# Patient Record
Sex: Female | Born: 1950 | Hispanic: No | Marital: Married | Smoking: Former smoker
Health system: Southern US, Community
[De-identification: ages and names within clinical notes are randomized; demographics above are authoritative.]

## PROBLEM LIST (undated history)

## (undated) DIAGNOSIS — I10 Essential (primary) hypertension: Secondary | ICD-10-CM

## (undated) DIAGNOSIS — J40 Bronchitis, not specified as acute or chronic: Secondary | ICD-10-CM

## (undated) DIAGNOSIS — J449 Chronic obstructive pulmonary disease, unspecified: Secondary | ICD-10-CM

## (undated) DIAGNOSIS — E119 Type 2 diabetes mellitus without complications: Secondary | ICD-10-CM

## (undated) HISTORY — PX: HERNIA REPAIR: SHX51

## (undated) HISTORY — PX: CHOLECYSTECTOMY: SHX55

## (undated) HISTORY — PX: FOOT SURGERY: SHX648

---

## 2005-05-11 ENCOUNTER — Emergency Department (HOSPITAL_COMMUNITY): Admission: EM | Admit: 2005-05-11 | Discharge: 2005-05-12 | Payer: Self-pay | Admitting: Emergency Medicine

## 2006-04-07 ENCOUNTER — Emergency Department (HOSPITAL_COMMUNITY): Admission: EM | Admit: 2006-04-07 | Discharge: 2006-04-07 | Payer: Self-pay | Admitting: Emergency Medicine

## 2006-05-19 ENCOUNTER — Inpatient Hospital Stay (HOSPITAL_COMMUNITY): Admission: EM | Admit: 2006-05-19 | Discharge: 2006-05-21 | Payer: Self-pay | Admitting: Emergency Medicine

## 2007-07-18 ENCOUNTER — Emergency Department (HOSPITAL_COMMUNITY): Admission: EM | Admit: 2007-07-18 | Discharge: 2007-07-18 | Payer: Self-pay | Admitting: Emergency Medicine

## 2008-02-21 ENCOUNTER — Ambulatory Visit: Payer: Self-pay | Admitting: Surgery

## 2008-02-21 ENCOUNTER — Other Ambulatory Visit: Payer: Self-pay

## 2008-02-28 ENCOUNTER — Ambulatory Visit: Payer: Self-pay | Admitting: Surgery

## 2010-11-04 NOTE — H&P (Signed)
Rhonda Blackwell, Rhonda Blackwell                 ACCOUNT NO.:  [id number]   MEDICAL RECORD NO.:  [id number]          PATIENT TYPE:  INP   LOCATION:  5711                         FACILITY:  [facility]   PHYSICIAN:  Kela Millin, M.D.DATE OF BIRTH:  11/16/1950   DATE OF ADMISSION:  05/19/2006  DATE OF DISCHARGE:                              HISTORY & PHYSICAL   PRIMARY CARE PHYSICIAN:  Unassigned.   CHIEF COMPLAINT:  Abdominal pain.   HISTORY OF PRESENT ILLNESS:  The patient is a pleasant 60 year old  female originally from [place], speaks limited [place], with past  medical history significant for GERD, diabetes mellitus, hypertension  and hypercholesterolemia who presents with complaints of abdominal pain  x9 days.  She also developed nausea and vomiting and was unable to keep  her food down so came to the ER.  She describes the abdominal pain as  diffuse cramping, severe in intensity, associated with nausea, vomiting  as already stated.  She denies diarrhea, dysuria, fevers, hematemesis,  hematochezia and/or melena.  She states that in September or so she had  a similar pain and her primary care physician at the time put her on  some medication and that resolved.  She reports that she is in the  process of finding a new primary care physician at this time because the  one she was seeing previously left the practice.   The patient was seen in the ER, and after she received Pepcid, Reglan  and Dilaudid, her symptoms improved.  Her chemistries revealed a sodium  of 120 and her lipase within normal limits at 27.  The patient was also  complaining of some back pain.  She had a CT scan of her abdomen and  pelvis which showed no acute abnormalities in the abdomen and pelvis-  status post cholecystectomy, multiple factorial spinal stenosis noted on  L3-4 and L4-5 levels.  She is admitted to the [facility] service  for further evaluation and management.   PAST MEDICAL HISTORY:  1. As stated  above.  2. History of arthritis.   MEDICATIONS:  1. Avandamet 2/500 mg daily.  2. Prilosec.  3. Zocor 40 mg q.h.s.  4. Avapro 850 mg daily.   ALLERGIES:  NKDA.   SOCIAL HISTORY:  Positive for tobacco a little over a pack per day.  Admits to alcohol use, [place] wine most days of the week.   FAMILY HISTORY:  Her sister has diabetes.  Her mother is deceased.  Had  diabetes and stroke.   REVIEW OF SYSTEMS:  As per HPI.  Other review of systems negative.   PHYSICAL EXAMINATION:  GENERAL:  The patient is a pleasant female.  She  is alert and oriented in no apparent distress.  VITAL SIGNS:  Her temperature is 97.7 with a blood pressure of 136/80,  pulse 59, respiratory rate of 16, O2 saturation of 97%.  HEENT:  PERRL.  EOMI.  Sclerae anicteric.  Dry mucus membranes.  No oral  exudates.  NECK:  Supple.  No adenopathy, no thyromegaly.  No JVD.  LUNGS:  Clear to auscultation bilaterally.  No crackles or wheezes.  CARDIOVASCULAR:  Regular rhythm, slightly bradycardic.  Normal S1-S2.  ABDOMEN:  Soft, mild diffuse tenderness.  No rebound tenderness.  Bowel  sounds present.  Nontender.  No organomegaly and no masses palpable.  EXTREMITIES:  No cyanosis and no edema.  NEUROLOGICAL:  She is alert and oriented x3.  Cranial nerves II through  XII grossly intact.  Nonfocal exam.   LABORATORY DATA:  CT of the abdomen and pelvis no acute abnormalities in  the abdomen.  Status post cholecystectomy.  No acute abnormalities in  the pelvis.  Small umbilical hernia containing fat, multifactorial  spinal stenosis at the L3-4 and L4-5 levels.  Her sodium is 120 with a  potassium of 4.6, chloride 89, CO2 of 24, glucose 107, BUN 8, creatinine  0.6, calcium is 8.8.  Her total protein is 7.2, AST is 27, ALT 23,  alkaline phosphate is 36, total bilirubin is 1.5 with a lipase of 27.  Her white cell count is 8.4, hemoglobin of 13.4, hematocrit of 40.4,  platelet count of 364.  Neutrophil count of 62%.    ASSESSMENT/PLAN:  1. Abdominal pain with nausea and vomiting:  As discussed above.  We      will obtain urinalysis with culture and sensitivity, questionable      gastroparesis.  CT scan of the abdomen as stated above.  Follow and      consider gastric emptying scan.  We will place the patient on      Reglan, proton pump inhibitor, pain management and follow.  2. Back pain:  CT scan as above with multifactorial spinal stenosis at      lumbar verterbrae-3/4 and lumbar verterbrae-4/5.  Pain management      following consult with orthopedics.  3. Hyponatremia:  Likely secondary to volume depletion.  Hydrate      following recheck.  Consider further evaluation as appropriate if      not improving on recheck.  4. Diabetes mellitus:  Accu-Chek, sliding scale insulin coverage.      Hold oral hypoglycemics until the patient is taking p.o. well.  5. Hypertension:  Continue Avapro.  6. Gastroesophageal reflux disease:  Proton pump inhibitor.  7. Hyperlipidemia:  Continue Zocor.  8. Alcohol use:  Ativan p.r.n. and follow.      Kela Millin, M.D.  Electronically Signed     ACV/MEDQ  D:  05/20/2006  T:  05/20/2006  Job:  [id number]

## 2011-03-09 LAB — DIFFERENTIAL
Basophils Relative: 1
Lymphocytes Relative: 30
Lymphs Abs: 3
Monocytes Absolute: 0.6
Monocytes Relative: 6
Neutro Abs: 6.3

## 2011-03-09 LAB — URINALYSIS, ROUTINE W REFLEX MICROSCOPIC
Bilirubin Urine: NEGATIVE
Ketones, ur: NEGATIVE
Protein, ur: NEGATIVE
Urobilinogen, UA: 0.2
pH: 7.5

## 2011-03-09 LAB — CBC
HCT: 37.5
Hemoglobin: 12.6
MCV: 76 — ABNORMAL LOW
RBC: 4.94
RDW: 14
WBC: 10.3

## 2011-03-09 LAB — COMPREHENSIVE METABOLIC PANEL
Alkaline Phosphatase: 49
Potassium: 3.8
Sodium: 130 — ABNORMAL LOW
Total Bilirubin: 0.7

## 2011-04-18 ENCOUNTER — Inpatient Hospital Stay (HOSPITAL_COMMUNITY)
Admission: EM | Admit: 2011-04-18 | Discharge: 2011-04-19 | DRG: 641 | Disposition: A | Discharge status: Home / Self Care | Attending: Internal Medicine | Admitting: Internal Medicine

## 2011-04-18 ENCOUNTER — Emergency Department (HOSPITAL_COMMUNITY)

## 2011-04-18 ENCOUNTER — Encounter: Payer: Self-pay | Admitting: Internal Medicine

## 2011-04-18 DIAGNOSIS — E871 Hypo-osmolality and hyponatremia: Secondary | ICD-10-CM

## 2011-04-18 DIAGNOSIS — E119 Type 2 diabetes mellitus without complications: Secondary | ICD-10-CM | POA: Diagnosis present

## 2011-04-18 DIAGNOSIS — T502X5A Adverse effect of carbonic-anhydrase inhibitors, benzothiadiazides and other diuretics, initial encounter: Secondary | ICD-10-CM | POA: Diagnosis present

## 2011-04-18 DIAGNOSIS — R109 Unspecified abdominal pain: Secondary | ICD-10-CM

## 2011-04-18 DIAGNOSIS — I959 Hypotension, unspecified: Secondary | ICD-10-CM | POA: Diagnosis present

## 2011-04-18 DIAGNOSIS — F17200 Nicotine dependence, unspecified, uncomplicated: Secondary | ICD-10-CM | POA: Diagnosis present

## 2011-04-18 LAB — COMPREHENSIVE METABOLIC PANEL
AST: 21 U/L (ref 0–37)
Albumin: 4.1 g/dL (ref 3.5–5.2)
CO2: 23 mEq/L (ref 19–32)
GFR calc Af Amer: >90 mL/min (ref >90)
Glucose, Bld: 133 mg/dL — ABNORMAL HIGH (ref 70–99)
Potassium: 4 mEq/L (ref 3.5–5.1)
Sodium: 119 mEq/L — CL (ref 135–145)

## 2011-04-18 LAB — OSMOLALITY: Osmolality: 263 mOsm/kg — ABNORMAL LOW (ref 275–300)

## 2011-04-18 LAB — SODIUM, URINE, RANDOM: Sodium, Ur: 39 mEq/L

## 2011-04-18 LAB — DIFFERENTIAL
Basophils Absolute: 0 10*3/uL (ref 0.0–0.1)
Basophils Relative: 0 % (ref 0–1)
Eosinophils Absolute: 0.1 10*3/uL (ref 0.0–0.7)
Eosinophils Relative: 1 % (ref 0–5)
Monocytes Relative: 6 % (ref 3–12)

## 2011-04-18 LAB — BASIC METABOLIC PANEL
BUN: 13 mg/dL (ref 6–23)
CO2: 26 mEq/L (ref 19–32)
CO2: 25 mEq/L (ref 19–32)
Calcium: 9.3 mg/dL (ref 8.4–10.5)
Calcium: 10.1 mg/dL (ref 8.4–10.5)
Chloride: 93 mEq/L — ABNORMAL LOW (ref 96–112)
Creatinine, Ser: 0.76 mg/dL (ref 0.50–1.10)
GFR calc Af Amer: >90 mL/min (ref >90)
GFR calc non Af Amer: >90 mL/min (ref >90)
GFR calc non Af Amer: >90 mL/min (ref >90)
Glucose, Bld: 117 mg/dL — ABNORMAL HIGH (ref 70–99)
Glucose, Bld: 145 mg/dL — ABNORMAL HIGH (ref 70–99)
Sodium: 126 mEq/L — ABNORMAL LOW (ref 135–145)

## 2011-04-18 LAB — CBC
Hemoglobin: 14.3 g/dL (ref 12.0–15.0)
MCH: 25.4 pg — ABNORMAL LOW (ref 26.0–34.0)
MCV: 73 fL — ABNORMAL LOW (ref 78.0–100.0)
RBC: 5.63 MIL/uL — ABNORMAL HIGH (ref 3.87–5.11)
RDW: 13.8 % (ref 11.5–15.5)
WBC: 11.9 10*3/uL — ABNORMAL HIGH (ref 4.0–10.5)

## 2011-04-18 LAB — GLUCOSE, CAPILLARY
Glucose-Capillary: 138 mg/dL — ABNORMAL HIGH (ref 70–99)
Glucose-Capillary: 122 mg/dL — ABNORMAL HIGH (ref 70–99)

## 2011-04-18 NOTE — H&P (Signed)
Hospital Admission Note Date: 04/18/2011  Patient name: Rhonda Blackwell Medical record number: [id number] Date of birth: May 12, 1951 Age: 60 y.o. Gender: female PCP: Dr. Loyal Jacobson   Medical Service: Internal Medicine Teaching Service  Attending physician:     1st Contact: Tawni Carnes, MS4  Pager: [phone] 2nd Contact: Dr. Johnette Abraham Pager: [phone] After 5 pm or weekends: 1st Contact:      Pager: [phone] 2nd Contact:      Pager: [phone]  Chief Complaint: Diffuse cramps  History of Present Illness:  Rhonda Blackwell is a pleasant 60 y/o with a PMH significant for HTN and type 2 DM who presents with diffuse, crampy pains most notably in her abdomen that started Sunday. She states that the pain comes and goes without any clear alleviating or aggravating factors, including OTC pain medications. The patient also notes some increased fatigue and lethargy over the weekend in addition to some nausea. Of note, the patient was newly started on chlorthalidone on Friday by her PCP. The patient was worried that this might be contributing to her symptoms and stopped the medication earlier this week. She denies any fever, headache, vomiting or new rashes. Upon admission, the patient had a sodium of 119 and was quickly given a 1 L NS bolus and started on NS at 200 mL/hr. She was also given zofran and valium in the ED. Several hours after admission, the patient stated that she had improvement of her abdominal pain.  Of note, the patient has been found to have a low sodium during a past doctor's appointment in February. She states that her hypertension medications were changed, although she is unsure what they were at that time.  Meds: Metformin 50-500 mg BID Irbesartan 150 mg BID Bystolic 20 mg BID Chlorthalidone 25 mg qday (d/ced) Pantoprazole 40 mg qday Lipitor 10 mg qday  Allergies: NKDA  Past Medical History: HTN Type 2 DM GERD Hernia Tobacco Abuse  Past Surgical  History: Cholecystectomy (2005) Hernia repair (2009)  Family History: Mother: DM, HTN, stroke Sister: DM, HTN, HLD  Social History: The patient lives in [place] with her husband. She is currently out of work due to debilitating knee pain that prevents her from standing for more than 2 hours. 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 She is unable to get around town because she does not have a [employer]. The patient has smoked 1-1.5 PPD for 41 years. She endorses past heavy alcohol use but has not had a drink since 2006. She denies illicit drug use.   Review of Systems: ROS negative except as noted in H&P above  Physical Exam: Vitals: T 98.7, HR 59, BP 132/74, RR 18 General: pt sitting in bed in ED in NAD HEENT: PERRL, EOMI, no scleral icterus, no conjunctival pallor Pulm: CTAB w/out w/r/c, good air mov't CV: RRR, nrml S1S2 w/out m/r/g Abd: NABS, slightly distended, NTTP, no HSM Ext: no c/c/e Neuro: A&Ox3, CNs II-XII grossly intact  Lab results: Basic Metabolic Panel:  Basename 04/18/11 0858  NA 119*  K 4.0  CL 82*  CO2 23  GLUCOSE 133*  BUN 10  CREATININE 0.57  CALCIUM 9.3  MG --  PHOS --   Urine Na: 39 Urine Osmolality: 236  Liver Function Tests:  Basename 04/18/11 0858  AST 21  ALT 18  ALKPHOS 63  BILITOT 1.1  PROT 7.2  ALBUMIN 4.1    Basename 04/18/11 0902  LIPASE 35  AMYLASE --   No results found for this basename: AMMONIA:2 in the last 72 hours CBC:  Basename 04/18/11 0912  WBC 11.9*  NEUTROABS 8.7*  HGB 14.3  HCT 41.1  MCV 73.0*  PLT 302     Imaging results:  Dg Abd Acute W/chest  04/18/2011  *RADIOLOGY REPORT*  Clinical Data: Abdominal pain, diarrhea  ACUTE ABDOMEN SERIES (ABDOMEN 2 VIEW & CHEST 1 VIEW)  Comparison: CT abdomen pelvis of 07/18/2007 and chest x-ray of the same date  Findings: The lungs are clear.  Mild cardiomegaly is stable. The right  hilum is somewhat more prominent most likely due to pulmonary artery segment, but follow-up chest x-ray with two views is recommended.  No bony abnormality is seen.  Supine and erect views of the abdomen show no bowel obstruction. There are a few scattered air-fluid levels throughout small bowel which may be due to gastroenteritis. Surgical clips are present in the right upper quadrant from prior cholecystectomy.  No free air is seen.  IMPRESSION:  1.  Slightly prominent right hilum probably due to pulmonary artery segment.  Consider follow-up two-view chest x-ray. 2.  No bowel obstruction or free air.  A few air-fluid levels may reflect gastroenteritis.  Original Report Authenticated By: Juline Patch, M.D.    Other results: EKG: normal EKG, normal sinus rhythm","unchanged from previous tracings"  Assessment & Plan by Problem: Rhonda Blackwell is a pleasant 60 y/o with a PMH significant for HTN and type 2 DM who presents with diffuse, crampy pains most notably in her abdomen that started Sunday. Given the patient's recent initiation of chlorthalidone, serum Na of 119 and resolving symptoms following IVF administration, these symptoms are most likely explained by acute hyponatremia secondary to antihypertensive medications.  1. Hyponatremia: The patient's symptoms can all be explained by her hyponatremia, which was likely secondary to her newly started chlorthalidone. Diuretic-induced hyponatremia is further supported by the patient's calculated serum osmolality of 249, urine osmolality of 236 and urine Na of 39 further support this. In addition, chlorthalidone is a common cause of medication-induced hyponatremia. However, if the patient does not have appropriate response in serum Na to IVF, we would consider the diagnosis of SIADH. While this patient is symptomatic, her lack of neurologic symptoms preclude the use of aggressive therapy. - Continue NS at 200 mL/hr with goal of < 10 meq/L per day - F/u 4 pm Na  and adjust NS infusion rate accordingly - F/u with patient's PCP regarding plan for BP meds upon discharge  2. Tobacco Abuse: The patient states a willingness to attempt tobacco cessation. The patient was counseled on tobacco cessation and told about potential options for tobacco cessation. - Start nicotine patches during hospitalization - Will provide smoking cessation materials prior to discharge  3. VTE prophylaxis: - Ambulation as tolerated  4. Dispo: Likely able to discharge in the next day or two following resolution of symptoms and correction of hyponatremia.   R2/3______________________________      R1________________________________  ATTENDING: I performed and/or observed a history and physical examination of the patient.  I discussed the case with the residents as noted and reviewed the residents' notes.  I agree with the findings and plan--please refer to the attending physician note for more details.  Signature________________________________  Printed Name_____________________________

## 2011-04-19 DIAGNOSIS — R109 Unspecified abdominal pain: Secondary | ICD-10-CM

## 2011-04-19 DIAGNOSIS — E871 Hypo-osmolality and hyponatremia: Secondary | ICD-10-CM

## 2011-04-19 LAB — BASIC METABOLIC PANEL
CO2: 27 mEq/L (ref 19–32)
Calcium: 9.9 mg/dL (ref 8.4–10.5)
Creatinine, Ser: 0.7 mg/dL (ref 0.50–1.10)
GFR calc Af Amer: >90 mL/min (ref >90)

## 2011-04-19 LAB — GLUCOSE, CAPILLARY

## 2012-02-22 ENCOUNTER — Emergency Department (HOSPITAL_COMMUNITY)

## 2012-02-22 ENCOUNTER — Observation Stay (HOSPITAL_COMMUNITY)
Admission: EM | Admit: 2012-02-22 | Discharge: 2012-02-23 | DRG: 392 | Disposition: A | Discharge status: Home / Self Care | Attending: Internal Medicine | Admitting: Internal Medicine

## 2012-02-22 ENCOUNTER — Encounter (HOSPITAL_COMMUNITY): Payer: Self-pay | Admitting: Emergency Medicine

## 2012-02-22 DIAGNOSIS — Y92009 Unspecified place in unspecified non-institutional (private) residence as the place of occurrence of the external cause: Secondary | ICD-10-CM

## 2012-02-22 DIAGNOSIS — E119 Type 2 diabetes mellitus without complications: Secondary | ICD-10-CM | POA: Diagnosis present

## 2012-02-22 DIAGNOSIS — E871 Hypo-osmolality and hyponatremia: Secondary | ICD-10-CM | POA: Diagnosis present

## 2012-02-22 DIAGNOSIS — F17200 Nicotine dependence, unspecified, uncomplicated: Secondary | ICD-10-CM | POA: Diagnosis present

## 2012-02-22 DIAGNOSIS — K589 Irritable bowel syndrome without diarrhea: Secondary | ICD-10-CM | POA: Diagnosis present

## 2012-02-22 DIAGNOSIS — T502X5A Adverse effect of carbonic-anhydrase inhibitors, benzothiadiazides and other diuretics, initial encounter: Secondary | ICD-10-CM | POA: Diagnosis present

## 2012-02-22 DIAGNOSIS — E785 Hyperlipidemia, unspecified: Secondary | ICD-10-CM | POA: Diagnosis present

## 2012-02-22 DIAGNOSIS — I10 Essential (primary) hypertension: Secondary | ICD-10-CM | POA: Diagnosis present

## 2012-02-22 DIAGNOSIS — A088 Other specified intestinal infections: Secondary | ICD-10-CM | POA: Diagnosis present

## 2012-02-22 DIAGNOSIS — R197 Diarrhea, unspecified: Principal | ICD-10-CM | POA: Diagnosis present

## 2012-02-22 HISTORY — DX: Essential (primary) hypertension: I10

## 2012-02-22 LAB — URINALYSIS, ROUTINE W REFLEX MICROSCOPIC
Ketones, ur: NEGATIVE mg/dL
Leukocytes, UA: NEGATIVE
Nitrite: NEGATIVE
Protein, ur: NEGATIVE mg/dL
Urobilinogen, UA: 0.2 mg/dL (ref 0.0–1.0)

## 2012-02-22 LAB — COMPREHENSIVE METABOLIC PANEL
Alkaline Phosphatase: 66 U/L (ref 39–117)
BUN: 9 mg/dL (ref 6–23)
CO2: 24 mEq/L (ref 19–32)
GFR calc Af Amer: >90 mL/min (ref >90)
GFR calc non Af Amer: >90 mL/min (ref >90)
Glucose, Bld: 103 mg/dL — ABNORMAL HIGH (ref 70–99)
Potassium: 4 mEq/L (ref 3.5–5.1)
Total Bilirubin: 0.9 mg/dL (ref 0.3–1.2)
Total Protein: 7.4 g/dL (ref 6.0–8.3)

## 2012-02-22 LAB — CBC WITH DIFFERENTIAL/PLATELET
Eosinophils Absolute: 0.1 10*3/uL (ref 0.0–0.7)
Hemoglobin: 14.3 g/dL (ref 12.0–15.0)
Lymphs Abs: 3.1 10*3/uL (ref 0.7–4.0)
MCH: 26.2 pg (ref 26.0–34.0)
MCV: 75.3 fL — ABNORMAL LOW (ref 78.0–100.0)
Monocytes Relative: 5 % (ref 3–12)
Neutrophils Relative %: 67 % (ref 43–77)
RBC: 5.46 MIL/uL — ABNORMAL HIGH (ref 3.87–5.11)

## 2012-02-22 MED ORDER — SITAGLIPTIN PHOS-METFORMIN HCL 50-500 MG PO TABS: 1.0000 | ORAL_TABLET | Freq: Two times a day (BID) | ORAL | Status: DC

## 2012-02-22 MED ORDER — FLUTICASONE PROPIONATE 50 MCG/ACT NA SUSP: 2.0000 | Freq: Every day | NASAL | Status: DC | PRN

## 2012-02-22 MED ORDER — HYDROCODONE-ACETAMINOPHEN 5-325 MG PO TABS
1.0000 | ORAL_TABLET | ORAL | Status: DC | PRN
  Administered 2012-02-23 (×2): 1 via ORAL
  Filled 2012-02-22 (×2): qty 1

## 2012-02-22 MED ORDER — SODIUM CHLORIDE 0.9 % IV SOLN
INTRAVENOUS | Status: AC
  Administered 2012-02-23: 01:00:00 via INTRAVENOUS

## 2012-02-22 MED ORDER — PROMETHAZINE HCL 12.5 MG PO TABS: 12.5000 mg | ORAL_TABLET | Freq: Four times a day (QID) | ORAL | Status: DC | PRN

## 2012-02-22 MED ORDER — IRBESARTAN 150 MG PO TABS
150.0000 mg | ORAL_TABLET | Freq: Two times a day (BID) | ORAL | Status: DC
  Administered 2012-02-23 (×2): 150 mg via ORAL
  Filled 2012-02-22 (×3): qty 1

## 2012-02-22 MED ORDER — HEPARIN SODIUM (PORCINE) 5000 UNIT/ML IJ SOLN
5000.0000 [IU] | Freq: Three times a day (TID) | INTRAMUSCULAR | Status: DC
  Filled 2012-02-22: qty 1

## 2012-02-22 MED ORDER — PANTOPRAZOLE SODIUM 40 MG PO TBEC: 40.0000 mg | DELAYED_RELEASE_TABLET | Freq: Every day | ORAL | Status: DC

## 2012-02-22 MED ORDER — NEBIVOLOL HCL 20 MG PO TABS: 20.0000 mg | ORAL_TABLET | Freq: Two times a day (BID) | ORAL | Status: DC

## 2012-02-22 MED ORDER — ALUM & MAG HYDROXIDE-SIMETH 200-200-20 MG/5ML PO SUSP: 10.0000 mL | Freq: Four times a day (QID) | ORAL | Status: DC | PRN

## 2012-02-22 MED ORDER — SODIUM CHLORIDE 0.9 % IV BOLUS (SEPSIS)
1000.0000 mL | Freq: Once | INTRAVENOUS | Status: AC
Start: 2012-02-22 — End: 2012-02-22
  Administered 2012-02-22: 1000 mL via INTRAVENOUS

## 2012-02-22 MED ORDER — NAPHAZOLINE HCL 0.1 % OP SOLN: 1.0000 [drp] | Freq: Four times a day (QID) | OPHTHALMIC | Status: DC | PRN

## 2012-02-22 MED ORDER — ATORVASTATIN CALCIUM 10 MG PO TABS
10.0000 mg | ORAL_TABLET | Freq: Every day | ORAL | Status: DC
  Filled 2012-02-22: qty 1

## 2012-02-22 NOTE — ED Notes (Signed)
Pt asked to get into gown.  Pt now on phone with family and will not end the call.  Will return shortly to attempted to refresh vitals.  RN aware

## 2012-02-22 NOTE — ED Notes (Signed)
Attempted to call report x1, asked to call back in 5-10 min.

## 2012-02-22 NOTE — ED Notes (Signed)
Correction going to br w/ diarrhea alot

## 2012-02-22 NOTE — ED Notes (Signed)
abd pain leg pain and cannot have bm today

## 2012-02-22 NOTE — H&P (Signed)
[facility]                                                                                    Patient Demographics  Rhonda Blackwell, is a 61 y.o. female  CSN: [id number]  MRN: [id number]  DOB - [birth year]  Admit Date - 02/22/2012  Outpatient Primary MD for the patient is Sid Falcon, MD   With History of -  Past Medical History  Diagnosis Date  . Hypertension   . Diabetes mellitus   . High cholesterol       No past surgical history on file.  in for   Chief Complaint  Patient presents with  . Abdominal Pain     HPI  Rhonda Blackwell  is a 61 y.o. female, with known past medical history of hypertension, diabetes, presents with complaints of feeling weak and secondary, as well complaining of nausea and diarrhea for the last 24 hours, and having lower extremity cramps, patient had similar complaints during his last admission in October of last year, patient was found to have hyponatremia with sodium level of 126, with no renal insufficiency, as well patient has been complaining of diarrhea she reports [place] episodes since last night, she denies any fever any vomiting but had no complaints of nausea, reports she's been having these episodes a chronically but she reports they've lasted longer this time, patient received 1 L of IV #bolus in ED, currently reports she feels much better. Denies any chest pain shortness of breath palpitation female in a bright red blood per rectum or coffee-ground emesis.    Review of Systems    In addition to the HPI above, No Fever-chills, No Headache, No changes with Vision or hearing, No problems swallowing food or Liquids, No Chest pain, Cough or Shortness of Breath, Mild Abdominal pain, mild Nausea but no Vommitting, has diarrhea No Blood in stool or Urine, No dysuria, No new skin rashes or bruises, No new joints pains-aches,  No new weakness, tingling, numbness in any extremity, complaints of lower extremity cramps No  recent weight gain or loss, No polyuria, polydypsia or polyphagia, No significant Mental Stressors.  A full 10 point Review of Systems was done, except as stated above, all other Review of Systems were negative.   Social History History  Substance Use Topics  . Smoking status: Current Everyday Smoker  . Smokeless tobacco: Not on file  . Alcohol Use: Yes    Family History No family history on file.   Prior to Admission medications   Medication Sig Start Date End Date Taking? Authorizing Provider  acetaminophen (TYLENOL) 500 MG tablet Take 1,000 mg by mouth every 6 (six) hours as needed. For pain.   Yes Historical Provider, MD  alum & mag hydroxide-simeth (MAALOX/MYLANTA) 200-200-20 MG/5ML suspension Take 10 mLs by mouth every 6 (six) hours as needed. For indigestion.   Yes Historical Provider, MD  atorvastatin (LIPITOR) 10 MG tablet Take 10 mg by mouth daily.   Yes Historical Provider, MD  fluticasone (FLONASE) 50 MCG/ACT nasal spray Place 2 sprays into the nose daily as needed. For congestion.   Yes Historical Provider, MD  hydrochlorothiazide (MICROZIDE) 12.5  MG capsule Take 12.5 mg by mouth daily.   Yes Historical Provider, MD  irbesartan (AVAPRO) 150 MG tablet Take 150 mg by mouth 2 (two) times daily.   Yes Historical Provider, MD  Nebivolol HCl (BYSTOLIC) 20 MG TABS Take 20 mg by mouth 2 (two) times daily.   Yes Historical Provider, MD  pantoprazole (PROTONIX) 40 MG tablet Take 40 mg by mouth daily.   Yes Historical Provider, MD  sitaGLIPtan-metformin (JANUMET) 50-500 MG per tablet Take 1 tablet by mouth 2 (two) times daily with a meal.   Yes Historical Provider, MD  tetrahydrozoline (VISINE EXTRA) 0.05 % ophthalmic solution Place 1 drop into both eyes 2 (two) times daily as needed. For dry eyes.   Yes Historical Provider, MD    No Known Allergies  Physical Exam  Vitals  Blood pressure 148/79, pulse 67, temperature 98.3 F (36.8 C), resp. rate 18, SpO2 97.00%.   1.  General well-nourished female lying in bed in NAD,   2. Normal affect and insight, Not Suicidal or Homicidal, Awake Alert, Oriented X 3.  3. No F.N deficits, ALL C.Nerves Intact, Strength 5/5 all 4 extremities, Sensation intact all 4 extremities, Plantars down going.  4. Ears and Eyes appear Normal, Conjunctivae clear, PERRLA. Moist Oral Mucosa.  5. Supple Neck, No JVD, No cervical lymphadenopathy appriciated, No Carotid Bruits.  6. Symmetrical Chest wall movement, Good air movement bilaterally, CTAB.  7. RRR, No Gallops, Rubs or Murmurs, No Parasternal Heave.  8. Positive Bowel Sounds, Abdomen Soft, Non tender, No organomegaly appriciated,No rebound -guarding or rigidity.  9.  No Cyanosis, Normal Skin Turgor, No Skin Rash or Bruise.  10. Good muscle tone,  joints appear normal , no effusions, Normal ROM.  11. No Palpable Lymph Nodes in Neck or Axillae    Data Review  CBC  Lab 02/22/12 1359  WBC 11.1*  HGB 14.3  HCT 41.1  PLT 321  MCV 75.3*  MCH 26.2  MCHC 34.8  RDW 13.3  LYMPHSABS 3.1  MONOABS 0.5  EOSABS 0.1  BASOSABS 0.0  BANDABS --   ------------------------------------------------------------------------------------------------------------------  Chemistries   Lab 02/22/12 1359  NA 126*  K 4.0  CL 89*  CO2 24  GLUCOSE 103*  BUN 9  CREATININE 0.65  CALCIUM 9.4  MG --  AST 23  ALT 19  ALKPHOS 66  BILITOT 0.9   ------------------------------------------------------------------------------------------------------------------ CrCl is unknown because there is no height on file for the current visit. ------------------------------------------------------------------------------------------------------------------ No results found for this basename: TSH,T4TOTAL,FREET3,T3FREE,THYROIDAB in the last 72 hours   Coagulation profile No results found for this basename: INR:5,PROTIME:5 in the last 168  hours ------------------------------------------------------------------------------------------------------------------- No results found for this basename: DDIMER:2 in the last 72 hours -------------------------------------------------------------------------------------------------------------------  Cardiac Enzymes No results found for this basename: CK:3,CKMB:3,TROPONINI:3,MYOGLOBIN:3 in the last 168 hours ------------------------------------------------------------------------------------------------------------------ No components found with this basename: POCBNP:3   ---------------------------------------------------------------------------------------------------------------  Urinalysis    Component Value Date/Time   COLORURINE YELLOW 02/22/2012 1352   APPEARANCEUR CLEAR 02/22/2012 1352   LABSPEC 1.008 02/22/2012 1352   PHURINE 7.0 02/22/2012 1352   GLUCOSEU NEGATIVE 02/22/2012 1352   HGBUR TRACE* 02/22/2012 1352   BILIRUBINUR NEGATIVE 02/22/2012 1352   KETONESUR NEGATIVE 02/22/2012 1352   PROTEINUR NEGATIVE 02/22/2012 1352   UROBILINOGEN 0.2 02/22/2012 1352   NITRITE NEGATIVE 02/22/2012 1352   LEUKOCYTESUR NEGATIVE 02/22/2012 1352    ----------------------------------------------------------------------------------------------------------------   Imaging results:   Dg Abd Acute W/chest  02/22/2012  *RADIOLOGY REPORT*  Clinical Data: Low abdominal pain for 1 year  with vomiting and cough.  History of hernia.  ACUTE ABDOMEN SERIES (ABDOMEN 2 VIEW & CHEST 1 VIEW)  Comparison: Acute abdominal series 04/18/2011.  Abdominal CT 07/18/2007.  Findings: The heart size and mediastinal contours are stable. Hilar prominence is unchanged from 2009 and likely vascular.  The lungs are clear.  There is no pleural effusion.  The bowel gas pattern is normal.  There is no free intraperitoneal air.  Cholecystectomy clips are noted.  There are scattered vascular calcifications.  There are calcifications adjacent to  the right femoral greater trochanter.  These appear chronic and therefore likely enthesophytes rather than hydroxyapatite deposition.  No acute osseous findings are seen.  IMPRESSION:  1.  No acute cardiopulmonary or abdominal process. 2.  Stable scattered vascular calcifications and calcifications adjacent to the right femoral greater trochanter.   Original Report Authenticated By: Gerrianne Scale, M.D.     Assessment & Plan  Active Problems:  Hyponatremia  Diarrhea  DM (diabetes mellitus)  HTN (hypertension)  Hyperlipemia    1. hyponatremia. This is most likely related to hydrochlorothiazide, will continue with IV normal saline already received 1 L in ED, we'll continue 100 cc per hour, will check serum osmolality urine sodium and urine osmolality, poor if the patient on an appropriate response to IV fluids will consider diagnosis of SIADH, but this appears to be recurrent problem , as she is on diuretics, and it is most likely the main cause.  2. Diarrhea: Most likely patient is having irritable bowel syndrome, but will check stool white blood cell and culture in case she is having gastroenteritis.  3. Diabetes mellitus, will continue patient on Janumet  4. Hypertension, continue her medication  5. Hyperlipidemia, continue statin  DVT Prophylaxis Heparin - AM Labs Ordered, also please review Full Orders  Family Communication: Admission, patients condition and plan of care including tests being ordered have been discussed with the patient  who indicate understanding and agree with the plan and Code Status.  Code Status focal  Disposition Plan: Home  Time spent in minutes : 40 min  Condition GUARDED  Mahika Vanvoorhis M.D on 02/22/2012 at 11:03 PM   [facility] Office  [phone]

## 2012-02-22 NOTE — ED Provider Notes (Signed)
History     CSN: [id number]  Arrival date & time 02/22/12  1333   First MD Initiated Contact with Patient 02/22/12 1836      Chief Complaint  Patient presents with  . Abdominal Pain   HPI  History provided by the patient. Patient is a 61 year old female with history of hypertension, hyperlipidemia, diabetes and prior history of hyponatremia who presents with complaints of general abdominal cramping, lower extremity cramps and back cramping. Patient states symptoms have been worsening over the past week. She also reports having nausea vomiting and diarrhea symptoms worsened with eating or drinking. Symptoms became worse last night with up to 10 soft "dirrhea like" BMs. Patient has been able to keep some fluids down and reports trying to drink as much water as possible. She also reports that she has not been taking her medications as normal his nausea vomiting. Patient denies any known sick contacts. She denies any recent fever, chills, sweats. Symptoms feel slightly similar to past hospitalization with low sodium although patient states pains in the abdomen and body are not as severe.     Past Medical History  Diagnosis Date  . Hypertension   . Diabetes mellitus   . High cholesterol     No past surgical history on file.  No family history on file.  History  Substance Use Topics  . Smoking status: Current Everyday Smoker  . Smokeless tobacco: Not on file  . Alcohol Use: Yes    OB History    Grav Para Term Preterm Abortions TAB SAB Ect Mult Living                  Review of Systems  Constitutional: Positive for appetite change. Negative for fever and chills.  Gastrointestinal: Positive for nausea, vomiting, abdominal pain and diarrhea.  Genitourinary: Negative for dysuria, frequency, hematuria and flank pain.  Musculoskeletal: Positive for myalgias and back pain.    Allergies  Review of patient's allergies indicates no known allergies.  Home Medications   Current  Outpatient Rx  Name Route Sig Dispense Refill  . ACETAMINOPHEN 500 MG PO TABS Oral Take 1,000 mg by mouth every 6 (six) hours as needed. For pain.    Marland Kitchen ALUM & MAG HYDROXIDE-SIMETH 200-200-20 MG/5ML PO SUSP Oral Take 10 mLs by mouth every 6 (six) hours as needed. For indigestion.    . ATORVASTATIN CALCIUM 10 MG PO TABS Oral Take 10 mg by mouth daily.    Marland Kitchen FLUTICASONE PROPIONATE 50 MCG/ACT NA SUSP Nasal Place 2 sprays into the nose daily as needed. For congestion.    Marland Kitchen HYDROCHLOROTHIAZIDE 12.5 MG PO CAPS Oral Take 12.5 mg by mouth daily.    . IRBESARTAN 150 MG PO TABS Oral Take 150 mg by mouth 2 (two) times daily.    . NEBIVOLOL HCL 20 MG PO TABS Oral Take 20 mg by mouth 2 (two) times daily.    Marland Kitchen PANTOPRAZOLE SODIUM 40 MG PO TBEC Oral Take 40 mg by mouth daily.    Marland Kitchen SITAGLIPTIN-METFORMIN HCL 50-500 MG PO TABS Oral Take 1 tablet by mouth 2 (two) times daily with a meal.    . TETRAHYDROZOLINE HCL 0.05 % OP SOLN Both Eyes Place 1 drop into both eyes 2 (two) times daily as needed. For dry eyes.      BP 159/74  Pulse 59  Temp 98.3 F (36.8 C)  Resp 16  SpO2 95%  Physical Exam  Nursing note and vitals reviewed. Constitutional: She is oriented to  person, place, and time. She appears well-developed and well-nourished. No distress.  HENT:  Head: Normocephalic.  Neck: Normal range of motion. Neck supple.  Cardiovascular: Normal rate and regular rhythm.   No murmur heard. Pulmonary/Chest: Effort normal and breath sounds normal. No respiratory distress. She has no wheezes. She has no rales.  Abdominal: Soft. Bowel sounds are normal. She exhibits no distension and no mass. There is no rebound and no guarding.       Very mild diffuse tenderness.  Neurological: She is alert and oriented to person, place, and time.  Skin: Skin is warm and dry.  Psychiatric: She has a normal mood and affect. Her behavior is normal.    ED Course  Procedures   Results for orders placed during the hospital  encounter of 02/22/12  CBC WITH DIFFERENTIAL      Component Value Range   WBC 11.1 (*) 4.0 - 10.5 K/uL   RBC 5.46 (*) 3.87 - 5.11 MIL/uL   Hemoglobin 14.3  12.0 - 15.0 g/dL   HCT 24.4  01.0 - 27.2 %   MCV 75.3 (*) 78.0 - 100.0 fL   MCH 26.2  26.0 - 34.0 pg   MCHC 34.8  30.0 - 36.0 g/dL   RDW 53.6  64.4 - 03.4 %   Platelets 321  150 - 400 K/uL   Neutrophils Relative 67  43 - 77 %   Neutro Abs 7.4  1.7 - 7.7 K/uL   Lymphocytes Relative 28  12 - 46 %   Lymphs Abs 3.1  0.7 - 4.0 K/uL   Monocytes Relative 5  3 - 12 %   Monocytes Absolute 0.5  0.1 - 1.0 K/uL   Eosinophils Relative 1  0 - 5 %   Eosinophils Absolute 0.1  0.0 - 0.7 K/uL   Basophils Relative 0  0 - 1 %   Basophils Absolute 0.0  0.0 - 0.1 K/uL  COMPREHENSIVE METABOLIC PANEL      Component Value Range   Sodium 126 (*) 135 - 145 mEq/L   Potassium 4.0  3.5 - 5.1 mEq/L   Chloride 89 (*) 96 - 112 mEq/L   CO2 24  19 - 32 mEq/L   Glucose, Bld 103 (*) 70 - 99 mg/dL   BUN 9  6 - 23 mg/dL   Creatinine, Ser 7.42  0.50 - 1.10 mg/dL   Calcium 9.4  8.4 - 59.5 mg/dL   Total Protein 7.4  6.0 - 8.3 g/dL   Albumin 4.1  3.5 - 5.2 g/dL   AST 23  0 - 37 U/L   ALT 19  0 - 35 U/L   Alkaline Phosphatase 66  39 - 117 U/L   Total Bilirubin 0.9  0.3 - 1.2 mg/dL   GFR calc non Af Amer >90  >90 mL/min   GFR calc Af Amer >90  >90 mL/min  URINALYSIS, ROUTINE W REFLEX MICROSCOPIC      Component Value Range   Color, Urine YELLOW  YELLOW   APPearance CLEAR  CLEAR   Specific Gravity, Urine 1.008  1.005 - 1.030   pH 7.0  5.0 - 8.0   Glucose, UA NEGATIVE  NEGATIVE mg/dL   Hgb urine dipstick TRACE (*) NEGATIVE   Bilirubin Urine NEGATIVE  NEGATIVE   Ketones, ur NEGATIVE  NEGATIVE mg/dL   Protein, ur NEGATIVE  NEGATIVE mg/dL   Urobilinogen, UA 0.2  0.0 - 1.0 mg/dL   Nitrite NEGATIVE  NEGATIVE   Leukocytes, UA NEGATIVE  NEGATIVE  URINE MICROSCOPIC-ADD ON      Component Value Range   Squamous Epithelial / LPF RARE  RARE   RBC / HPF 0-2  <3  RBC/hpf  LIPASE, BLOOD      Component Value Range   Lipase 51  11 - 59 U/L       Dg Abd Acute W/chest  02/22/2012  *RADIOLOGY REPORT*  Clinical Data: Low abdominal pain for 1 year with vomiting and cough.  History of hernia.  ACUTE ABDOMEN SERIES (ABDOMEN 2 VIEW & CHEST 1 VIEW)  Comparison: Acute abdominal series 04/18/2011.  Abdominal CT 07/18/2007.  Findings: The heart size and mediastinal contours are stable. Hilar prominence is unchanged from 2009 and likely vascular.  The lungs are clear.  There is no pleural effusion.  The bowel gas pattern is normal.  There is no free intraperitoneal air.  Cholecystectomy clips are noted.  There are scattered vascular calcifications.  There are calcifications adjacent to the right femoral greater trochanter.  These appear chronic and therefore likely enthesophytes rather than hydroxyapatite deposition.  No acute osseous findings are seen.  IMPRESSION:  1.  No acute cardiopulmonary or abdominal process. 2.  Stable scattered vascular calcifications and calcifications adjacent to the right femoral greater trochanter.   Original Report Authenticated By: Gerrianne Scale, M.D.      1. Hyponatremia       MDM  6:30PM patient seen and evaluated. Patient resting comfortably and appears in no acute distress. Patient's symptoms are similar to prior hospital admission. She states symptoms are less severe currently.   Spoke with [facility] hospitalist for unassigned.  They will see pt and evaluate.    [facility] will admit patient.  Angus Seller, [place] 02/22/12 2308

## 2012-02-23 LAB — COMPREHENSIVE METABOLIC PANEL
CO2: 25 mEq/L (ref 19–32)
Calcium: 9.2 mg/dL (ref 8.4–10.5)
Chloride: 101 mEq/L (ref 96–112)
Creatinine, Ser: 0.72 mg/dL (ref 0.50–1.10)
GFR calc Af Amer: >90 mL/min (ref >90)
GFR calc non Af Amer: >90 mL/min (ref >90)
Glucose, Bld: 103 mg/dL — ABNORMAL HIGH (ref 70–99)
Total Bilirubin: 0.5 mg/dL (ref 0.3–1.2)

## 2012-02-23 LAB — OSMOLALITY, URINE: Osmolality, Ur: 155 mOsm/kg — ABNORMAL LOW (ref 390–1090)

## 2012-02-23 LAB — SODIUM, URINE, RANDOM: Sodium, Ur: 28 mEq/L

## 2012-02-23 LAB — GLUCOSE, CAPILLARY: Glucose-Capillary: 90 mg/dL (ref 70–99)

## 2012-02-23 LAB — OSMOLALITY: Osmolality: 282 mOsm/kg (ref 275–300)

## 2012-02-23 MED ORDER — LINAGLIPTIN 5 MG PO TABS
5.0000 mg | ORAL_TABLET | Freq: Every day | ORAL | Status: DC
  Administered 2012-02-23: 5 mg via ORAL
  Filled 2012-02-23: qty 1

## 2012-02-23 MED ORDER — PANTOPRAZOLE SODIUM 40 MG PO TBEC
40.0000 mg | DELAYED_RELEASE_TABLET | Freq: Every day | ORAL | Status: DC
  Administered 2012-02-23: 40 mg via ORAL
  Filled 2012-02-23: qty 1

## 2012-02-23 MED ORDER — METFORMIN HCL 500 MG PO TABS
500.0000 mg | ORAL_TABLET | Freq: Two times a day (BID) | ORAL | Status: DC
  Administered 2012-02-23: 500 mg via ORAL
  Filled 2012-02-23 (×4): qty 1

## 2012-02-23 MED ORDER — NAPHAZOLINE-PHENIRAMINE 0.025-0.3 % OP SOLN: 1.0000 [drp] | Freq: Four times a day (QID) | OPHTHALMIC | Status: DC | PRN

## 2012-02-23 MED ORDER — HEPARIN SODIUM (PORCINE) 5000 UNIT/ML IJ SOLN
5000.0000 [IU] | Freq: Three times a day (TID) | INTRAMUSCULAR | Status: DC
  Administered 2012-02-23: 5000 [IU] via SUBCUTANEOUS
  Filled 2012-02-23 (×4): qty 1

## 2012-02-23 MED ORDER — NEBIVOLOL HCL 10 MG PO TABS
20.0000 mg | ORAL_TABLET | Freq: Two times a day (BID) | ORAL | Status: DC
  Administered 2012-02-23 (×2): 20 mg via ORAL
  Filled 2012-02-23 (×3): qty 2

## 2012-02-23 NOTE — ED Provider Notes (Signed)
Medical screening examination/treatment/procedure(s) were performed by non-physician practitioner and as supervising physician I was immediately available for consultation/collaboration.  Laporchia Nakajima T Victorious Kundinger, MD 02/23/12 0034 

## 2012-02-23 NOTE — Progress Notes (Signed)
Pt admitted to unit. Pt is A&O and VS are stable. She was oriented to the unit and is resting comfortably.

## 2012-02-23 NOTE — Discharge Summary (Signed)
Physician Discharge Summary  Rhonda Blackwell JXB:[id number] DOB: 28-Jun-1950 DOA: 02/22/2012  PCP: Sid Falcon, MD  Admit date: 02/22/2012 Discharge date: 02/23/2012  Discharge Diagnoses:  Active Problems:  Diarrhea  Hyponatremia  DM (diabetes mellitus)  HTN (hypertension)  Hyperlipemia   Discharge Condition: stable  Disposition:  Follow-up Information    Follow up with Sid Falcon, MD in 2 weeks. (hospital follow up)    Contact information:   [address] [place] [address] [phone]    Basic metabolic panel. Titrate Bp meds as tolerate it, avoids HCTZ causes hyponatremia.      Diet:ADA diet Wt Readings from Last 3 Encounters:  02/23/12 88.2 kg (194 lb 7.1 oz)    History of present illness:  61 y.o. female, with known past medical history of hypertension, diabetes, presents with complaints of feeling weak and secondary, as well complaining of nausea and diarrhea for the last 24 hours, and having lower extremity cramps, patient had similar complaints during his last admission in October of last year, patient was found to have hyponatremia with sodium level of 126, with no renal insufficiency, as well patient has been complaining of diarrhea she reports [place] episodes since last night, she denies any fever any vomiting but had no complaints of nausea, reports she's been having these episodes a chronically but she reports they've lasted longer this time, patient received 1 L of IV #bolus in ED, currently reports she feels much better. Denies any chest pain shortness of breath palpitation female in a bright red blood per rectum or coffee-ground emesis.   Hospital Course:  Diarrhea (02/22/2012) -resolved, ? Viral gastroenteritis vs IBS. -cultures negative, resolved in 24 hours.  Hyponatremia (02/22/2012) -resolved with IV fluids. HCTZ held. . Multifactorial Decrease intravascular volume and HCTZ.  DM (diabetes mellitus) (02/22/2012) -no change continue  current therapy.   HTN (hypertension) (02/22/2012) -stable   Discharge Exam: Filed Vitals:   02/23/12 1029  BP: 135/71  Pulse: 56  Temp:   Resp:    Filed Vitals:   02/23/12 0013 02/23/12 0039 02/23/12 0509 02/23/12 1029  BP: 137/67 155/80 157/88 135/71  Pulse: 74 56 59 56  Temp:  98.2 F (36.8 C) 98.7 F (37.1 C)   TempSrc:  Oral Oral   Resp: 16 18 16    Height:  5\' 2"  (1.575 m)    Weight:  88.2 kg (194 lb 7.1 oz)    SpO2: 99% 92% 91%      Discharge Instructions  Discharge Orders    Future Orders Please Complete By Expires   Diet - low sodium heart healthy      Increase activity slowly        Medication List  As of 02/23/2012  1:28 PM   STOP taking these medications         hydrochlorothiazide 12.5 MG capsule         TAKE these medications         acetaminophen 500 MG tablet   Commonly known as: TYLENOL   Take 1,000 mg by mouth every 6 (six) hours as needed. For pain.      alum & mag hydroxide-simeth 200-200-20 MG/5ML suspension   Commonly known as: MAALOX/MYLANTA   Take 10 mLs by mouth every 6 (six) hours as needed. For indigestion.      atorvastatin 10 MG tablet   Commonly known as: LIPITOR   Take 10 mg by mouth daily.      BYSTOLIC 20 MG Tabs  Generic drug: Nebivolol HCl   Take 20 mg by mouth 2 (two) times daily.      fluticasone 50 MCG/ACT nasal spray   Commonly known as: FLONASE   Place 2 sprays into the nose daily as needed. For congestion.      irbesartan 150 MG tablet   Commonly known as: AVAPRO   Take 150 mg by mouth 2 (two) times daily.      pantoprazole 40 MG tablet   Commonly known as: PROTONIX   Take 40 mg by mouth daily.      sitaGLIPtan-metformin 50-500 MG per tablet   Commonly known as: JANUMET   Take 1 tablet by mouth 2 (two) times daily with a meal.      VISINE EXTRA 0.05 % ophthalmic solution   Generic drug: tetrahydrozoline   Place 1 drop into both eyes 2 (two) times daily as needed. For dry eyes.               The results of significant diagnostics from this hospitalization (including imaging, microbiology, ancillary and laboratory) are listed below for reference.    Significant Diagnostic Studies: Dg Abd Acute W/chest  02/22/2012  *RADIOLOGY REPORT*  Clinical Data: Low abdominal pain for 1 year with vomiting and cough.  History of hernia.  ACUTE ABDOMEN SERIES (ABDOMEN 2 VIEW & CHEST 1 VIEW)  Comparison: Acute abdominal series 04/18/2011.  Abdominal CT 07/18/2007.  Findings: The heart size and mediastinal contours are stable. Hilar prominence is unchanged from 2009 and likely vascular.  The lungs are clear.  There is no pleural effusion.  The bowel gas pattern is normal.  There is no free intraperitoneal air.  Cholecystectomy clips are noted.  There are scattered vascular calcifications.  There are calcifications adjacent to the right femoral greater trochanter.  These appear chronic and therefore likely enthesophytes rather than hydroxyapatite deposition.  No acute osseous findings are seen.  IMPRESSION:  1.  No acute cardiopulmonary or abdominal process. 2.  Stable scattered vascular calcifications and calcifications adjacent to the right femoral greater trochanter.   Original Report Authenticated By: Gerrianne Scale, M.D.     Microbiology: No results found for this or any previous visit (from the past 240 hour(s)).   Labs: Basic Metabolic Panel:  Lab 02/23/12 1610 02/22/12 1359  NA 136 126*  K 4.0 4.0  CL 101 89*  CO2 25 24  GLUCOSE 103* 103*  BUN 9 9  CREATININE 0.72 0.65  CALCIUM 9.2 9.4  MG -- --  PHOS -- --   Liver Function Tests:  Lab 02/23/12 0540 02/22/12 1359  AST 21 23  ALT 17 19  ALKPHOS 56 66  BILITOT 0.5 0.9  PROT 6.6 7.4  ALBUMIN 3.6 4.1    Lab 02/22/12 2143  LIPASE 51  AMYLASE --   No results found for this basename: AMMONIA:5 in the last 168 hours CBC:  Lab 02/22/12 1359  WBC 11.1*  NEUTROABS 7.4  HGB 14.3  HCT 41.1  MCV 75.3*  PLT 321    Cardiac Enzymes: No results found for this basename: CKTOTAL:5,CKMB:5,CKMBINDEX:5,TROPONINI:5 in the last 168 hours BNP: No components found with this basename: POCBNP:5 CBG:  Lab 02/23/12 1228 02/23/12 0802  GLUCAP 90 115*    Time coordinating discharge: 30 minutes  Signed:  Marinda Elk  [facility] 02/23/2012, 1:28 PM

## 2012-02-23 NOTE — Progress Notes (Signed)
02/23/12 1350  D/C instructions explained and given to pt. and daughter; no problems noted;pt. states she's ready to go home. No change in skin.  Accompanied downstairs via w/c by volunteer and daughter.  Leandrew Koyanagi Melody Savidge,RN

## 2012-02-23 NOTE — Care Management Note (Signed)
    Page 1 of 1   02/23/2012     3:36:04 PM   CARE MANAGEMENT NOTE 02/23/2012  Patient:  Rhonda Blackwell, Rhonda Blackwell   Account Number:  [id number]  Date Initiated:  02/23/2012  Documentation initiated by:  Letha Cape  Subjective/Objective Assessment:   dx abd pain, n/diarrhea, hyponatemia  admit- lives with spouse. pta independent.     Action/Plan:   Anticipated DC Date:  02/23/2012   Anticipated DC Plan:  HOME/SELF CARE      DC Planning Services  CM consult      Choice offered to / List presented to:             Status of service:  Completed, signed off Medicare Important Message given?   (If response is "NO", the following Medicare IM given date fields will be blank) Date Medicare IM given:   Date Additional Medicare IM given:    Discharge Disposition:  HOME/SELF CARE  Per UR Regulation:  Reviewed for med. necessity/level of care/duration of stay  If discussed at Long Length of Stay Meetings, dates discussed:    Comments:  02/23/12 15:35 [place] Ladona Ridgel RN, BSN [phone] patient lives with spouse, pta independent.  No needs anticipated.

## 2012-02-23 NOTE — Progress Notes (Signed)
[facility] PROGRESS NOTE  Rhonda Blackwell ZOX:[id number] DOB: March 06, 1951 DOA: 02/22/2012   Assessment/Plan: Patient Active Hospital Problem List: Diarrhea (02/22/2012) -resolved, ? Viral gastroenteritis vs IBS  Hyponatremia (02/22/2012) -resolved. Multifactorial Decrease intravascular volume and HCTZ  DM (diabetes mellitus) (02/22/2012) -no change continue current therapy.  HTN (hypertension) (02/22/2012) -stable     LOS: 1 day   Procedures:  none  Antibiotics:  none   Subjective: No complains  Objective: Filed Vitals:   02/23/12 0013 02/23/12 0039 02/23/12 0509 02/23/12 1029  BP: 137/67 155/80 157/88 135/71  Pulse: 74 56 59 56  Temp:  98.2 F (36.8 C) 98.7 F (37.1 C)   TempSrc:  Oral Oral   Resp: 16 18 16    Height:  5\' 2"  (1.575 m)    Weight:  88.2 kg (194 lb 7.1 oz)    SpO2: 99% 92% 91%     Intake/Output Summary (Last 24 hours) at 02/23/12 1311 Last data filed at 02/23/12 1043  Gross per 24 hour  Intake 656.67 ml  Output    925 ml  Net -268.33 ml   Weight change:   Exam:  General: Alert, awake, oriented x3, in no acute distress.  HEENT: No bruits, no goiter.  Heart: Regular rate and rhythm, without murmurs, rubs, gallops.  Lungs: Good air movement, bilateral air movement.  Abdomen: Soft, nontender, nondistended, positive bowel sounds.  Neuro: Grossly intact, nonfocal.   Data Reviewed: Basic Metabolic Panel:  Lab 02/23/12 8119 02/22/12 1359  NA 136 126*  K 4.0 4.0  CL 101 89*  CO2 25 24  GLUCOSE 103* 103*  BUN 9 9  CREATININE 0.72 0.65  CALCIUM 9.2 9.4  MG -- --  PHOS -- --   Liver Function Tests:  Lab 02/23/12 0540 02/22/12 1359  AST 21 23  ALT 17 19  ALKPHOS 56 66  BILITOT 0.5 0.9  PROT 6.6 7.4  ALBUMIN 3.6 4.1    Lab 02/22/12 2143  LIPASE 51  AMYLASE --   No results found for this basename: AMMONIA:5 in the last 168 hours CBC:  Lab 02/22/12 1359  WBC 11.1*  NEUTROABS 7.4  HGB 14.3  HCT 41.1  MCV 75.3*  PLT 321    Cardiac Enzymes: No results found for this basename: CKTOTAL:5,CKMB:5,CKMBINDEX:5,TROPONINI:5 in the last 168 hours BNP: No components found with this basename: POCBNP:5 CBG:  Lab 02/23/12 1228 02/23/12 0802  GLUCAP 90 115*    No results found for this or any previous visit (from the past 240 hour(s)).   Studies: Dg Abd Acute W/chest  02/22/2012  *RADIOLOGY REPORT*  Clinical Data: Low abdominal pain for 1 year with vomiting and cough.  History of hernia.  ACUTE ABDOMEN SERIES (ABDOMEN 2 VIEW & CHEST 1 VIEW)  Comparison: Acute abdominal series 04/18/2011.  Abdominal CT 07/18/2007.  Findings: The heart size and mediastinal contours are stable. Hilar prominence is unchanged from 2009 and likely vascular.  The lungs are clear.  There is no pleural effusion.  The bowel gas pattern is normal.  There is no free intraperitoneal air.  Cholecystectomy clips are noted.  There are scattered vascular calcifications.  There are calcifications adjacent to the right femoral greater trochanter.  These appear chronic and therefore likely enthesophytes rather than hydroxyapatite deposition.  No acute osseous findings are seen.  IMPRESSION:  1.  No acute cardiopulmonary or abdominal process. 2.  Stable scattered vascular calcifications and calcifications adjacent to the right femoral greater trochanter.   Original Report Authenticated By: Kimberlee Nearing.  Purcell Mouton, M.D.     Scheduled Meds:   . atorvastatin  10 mg Oral q1800  . heparin  5,000 Units Subcutaneous Q8H  . irbesartan  150 mg Oral BID  . linagliptin  5 mg Oral Daily  . metFORMIN  500 mg Oral BID WC  . nebivolol  20 mg Oral BID  . pantoprazole  40 mg Oral Q1200  . sodium chloride  1,000 mL Intravenous Once  . DISCONTD: heparin  5,000 Units Subcutaneous Q8H  . DISCONTD: Nebivolol HCl  20 mg Oral BID  . DISCONTD: pantoprazole  40 mg Oral Daily  . DISCONTD: sitaGLIPtan-metformin  1 tablet Oral BID WC   Continuous Infusions:   . sodium chloride 100  mL/hr at 02/23/12 0055    Lambert Keto, MD  [facility] Pager [phone]  If 7PM-7AM, please contact night-coverage [email or web address] Password [email or web address] 02/23/2012, 1:11 PM

## 2012-12-24 IMAGING — CR DG ABDOMEN ACUTE W/ 1V CHEST
3 series · 3 of 3 positions shown · non-contrast
Comparison: Acute abdominal series 04/18/2011.  Abdominal CT
07/18/2007.

CLINICAL DATA: Low abdominal pain for 1 year with vomiting and
cough.  History of hernia.

ACUTE ABDOMEN SERIES (ABDOMEN 2 VIEW & CHEST 1 VIEW)

[w chest pa]
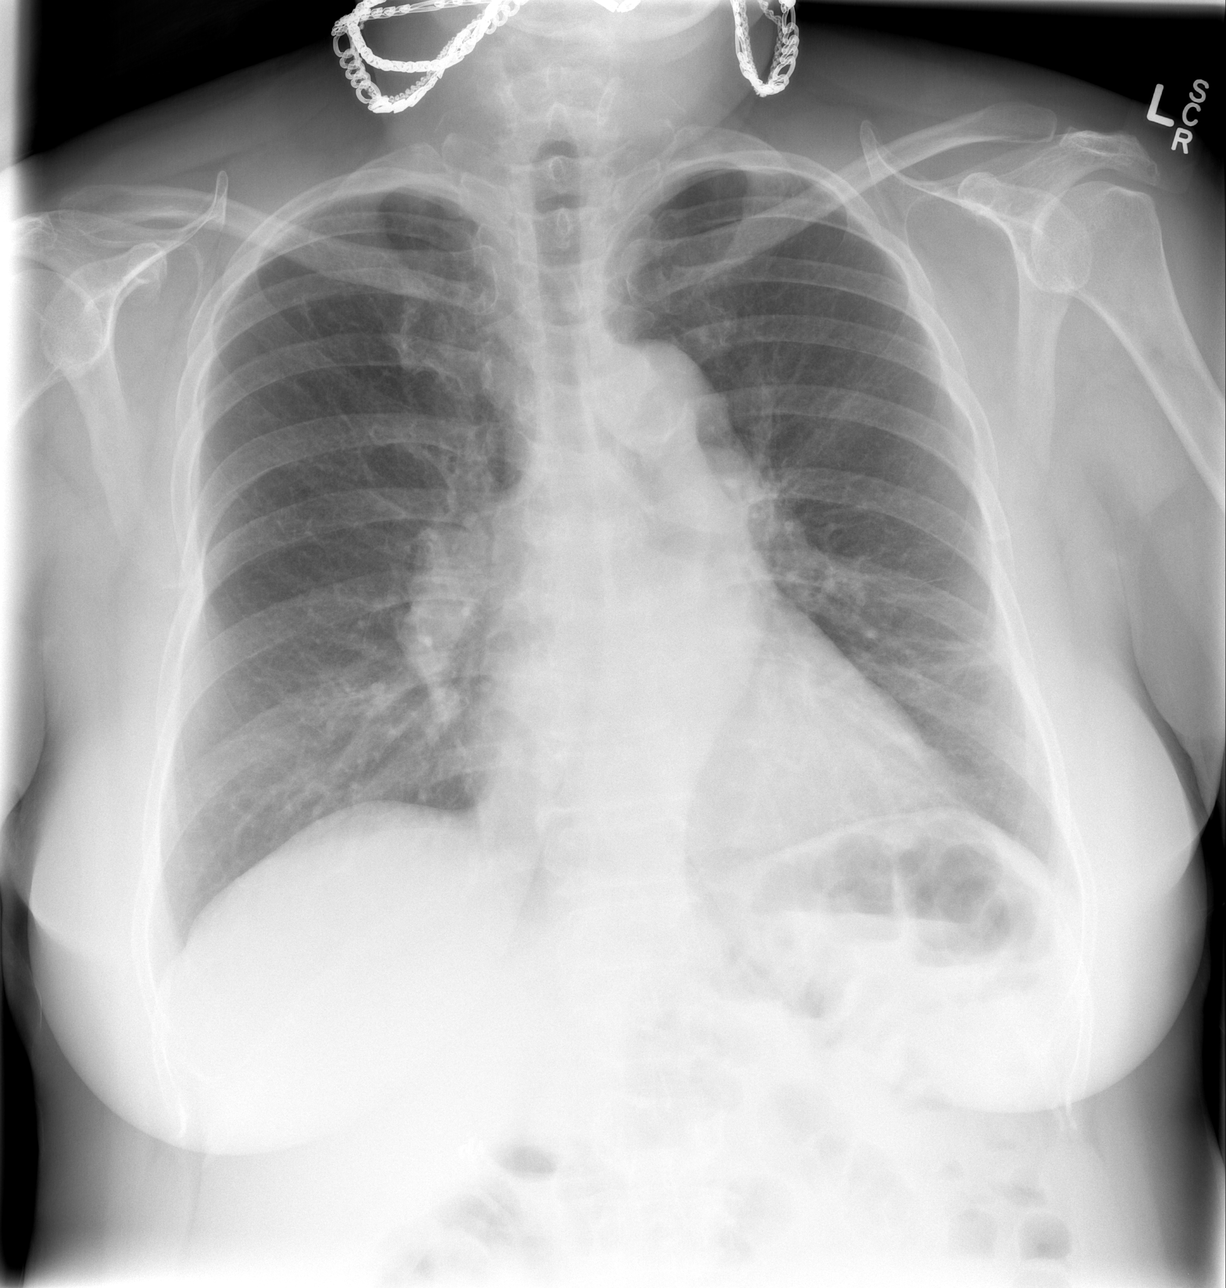

[w abdomen upright]
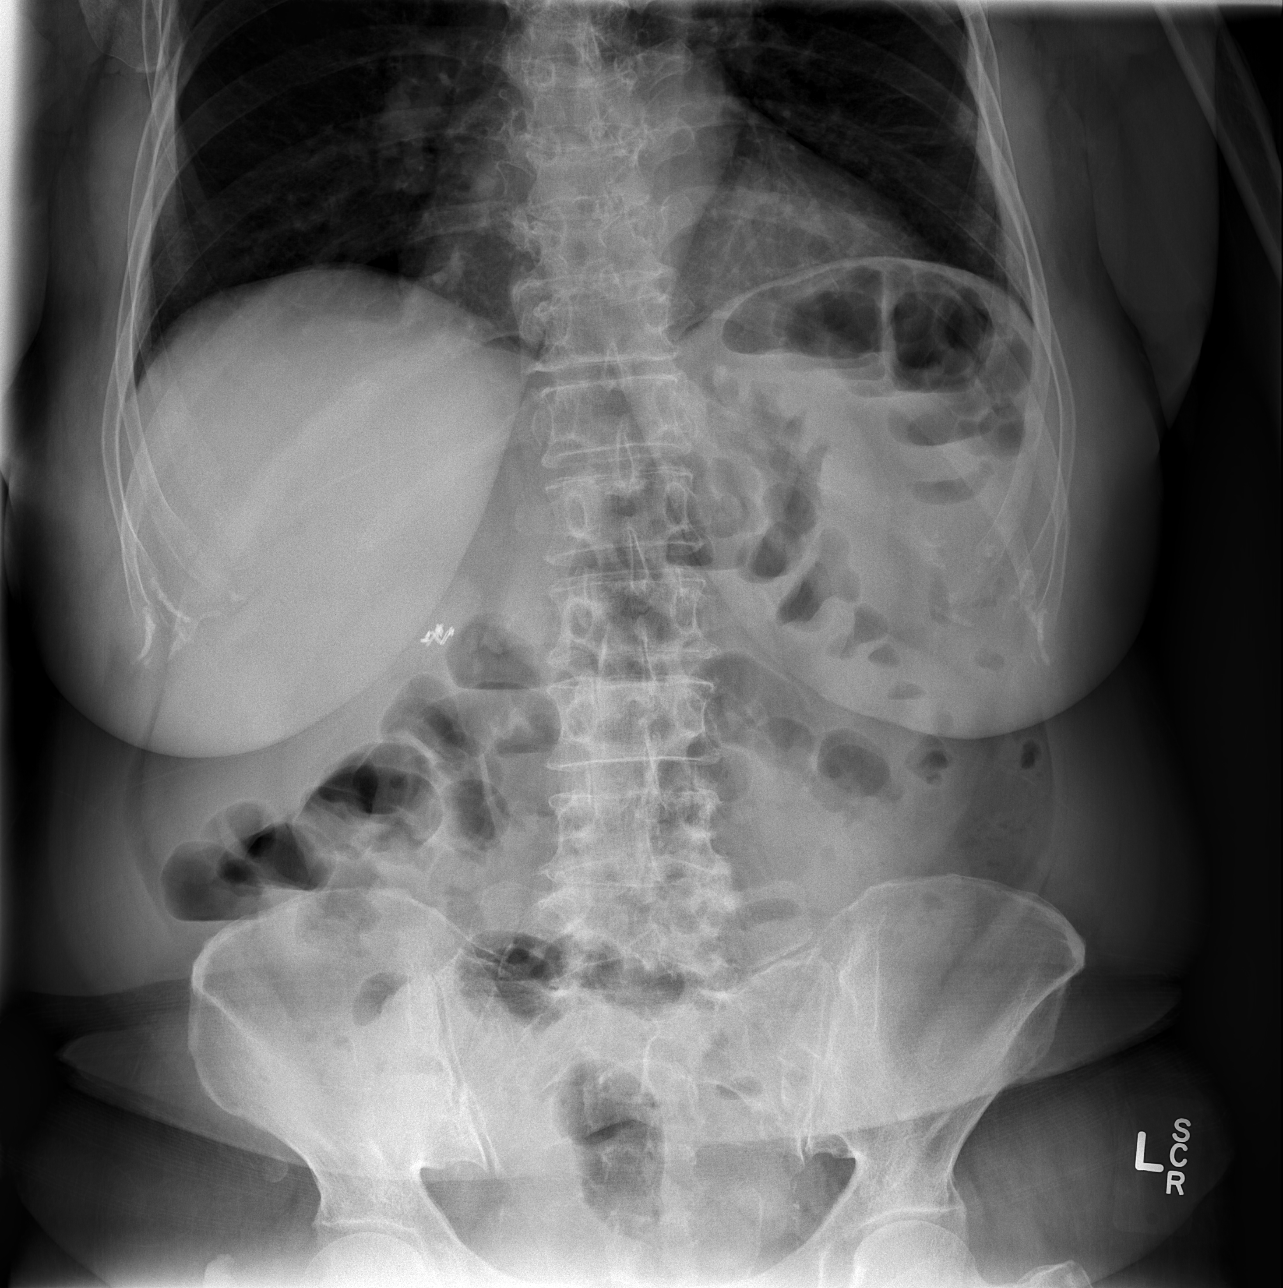

[t abdomen supine]
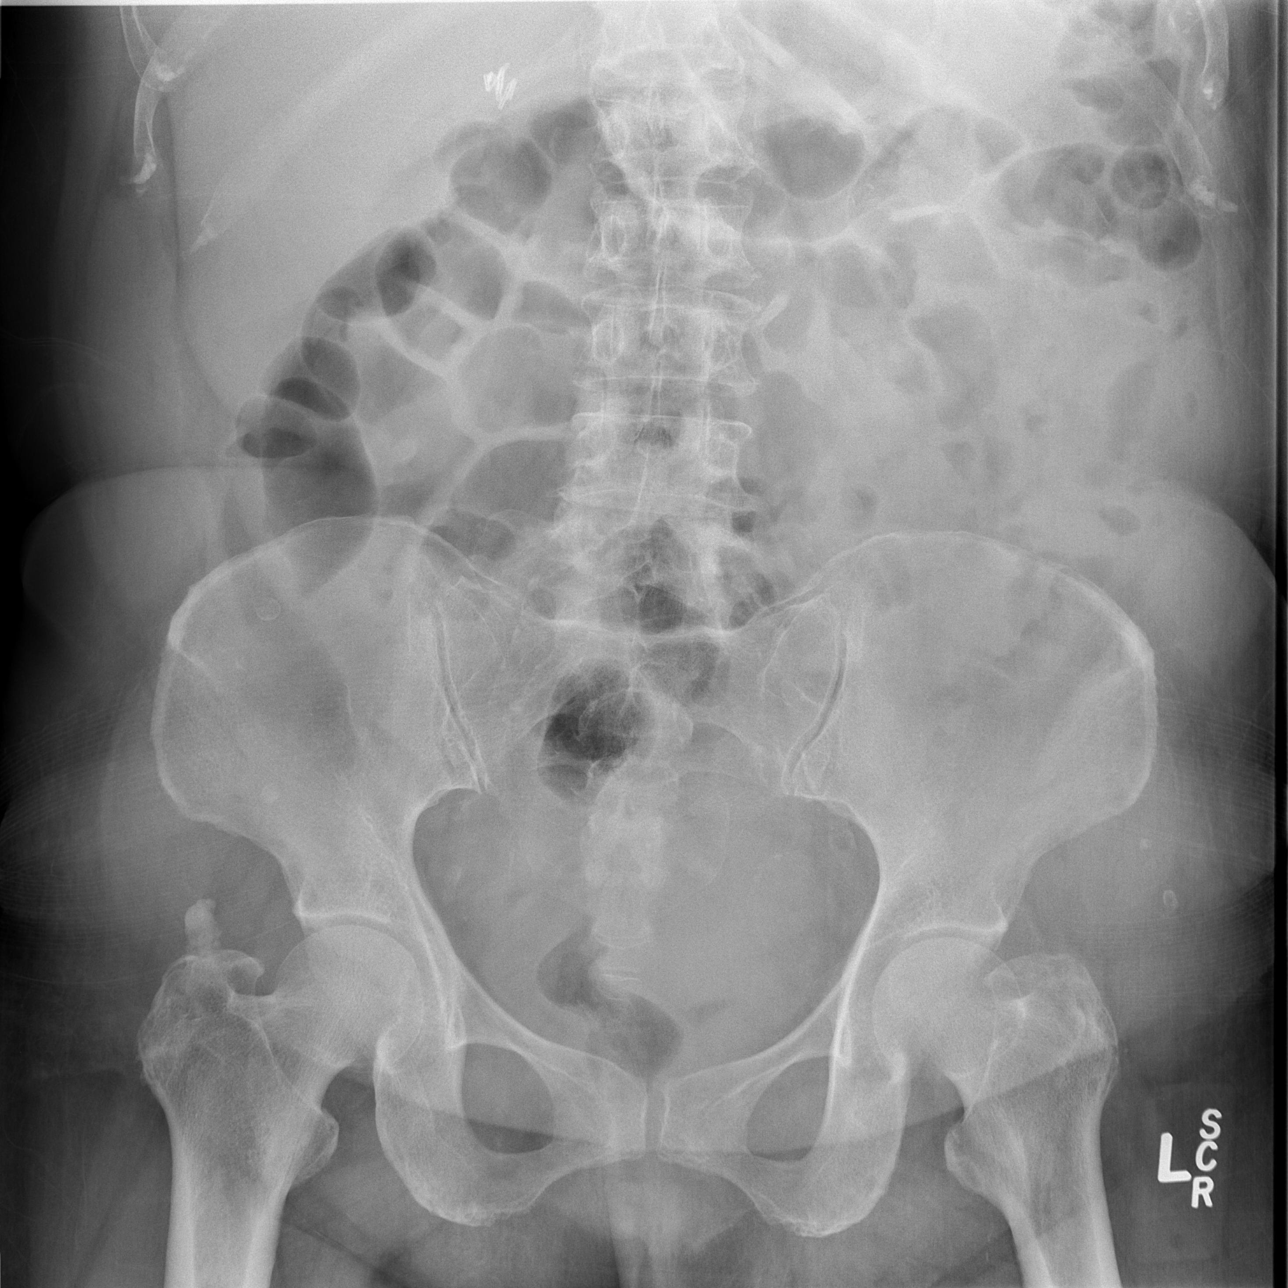

[3 of 3 positions shown; findings below may reference images not displayed]

FINDINGS: The heart size and mediastinal contours are stable.
Hilar prominence is unchanged from 6881 and likely vascular.  The
lungs are clear.  There is no pleural effusion.

The bowel gas pattern is normal.  There is no free intraperitoneal
air.  Cholecystectomy clips are noted.  There are scattered
vascular calcifications.  There are calcifications adjacent to the
right femoral greater trochanter.  These appear chronic and
therefore likely enthesophytes rather than hydroxyapatite
deposition.  No acute osseous findings are seen.
IMPRESSION: 1.  No acute cardiopulmonary or abdominal process.
2.  Stable scattered vascular calcifications and calcifications
adjacent to the right femoral greater trochanter.

## 2012-12-26 ENCOUNTER — Ambulatory Visit: Attending: Neurology | Admitting: Rehabilitation

## 2012-12-26 DIAGNOSIS — IMO0001 Reserved for inherently not codable concepts without codable children: Secondary | ICD-10-CM | POA: Insufficient documentation

## 2012-12-26 DIAGNOSIS — IMO0002 Reserved for concepts with insufficient information to code with codable children: Secondary | ICD-10-CM | POA: Insufficient documentation

## 2012-12-26 DIAGNOSIS — M545 Low back pain, unspecified: Secondary | ICD-10-CM | POA: Insufficient documentation

## 2012-12-26 DIAGNOSIS — M25559 Pain in unspecified hip: Secondary | ICD-10-CM | POA: Insufficient documentation

## 2012-12-31 ENCOUNTER — Ambulatory Visit: Admitting: Physical Therapy

## 2013-01-02 ENCOUNTER — Ambulatory Visit: Admitting: Physical Therapy

## 2013-01-07 ENCOUNTER — Ambulatory Visit

## 2013-01-09 ENCOUNTER — Ambulatory Visit: Admitting: Physical Therapy

## 2013-01-14 ENCOUNTER — Ambulatory Visit: Admitting: Rehabilitation

## 2013-01-16 ENCOUNTER — Ambulatory Visit: Admitting: Rehabilitation

## 2013-01-21 ENCOUNTER — Ambulatory Visit: Attending: Rehabilitation | Admitting: Rehabilitation

## 2013-01-21 DIAGNOSIS — M545 Low back pain, unspecified: Secondary | ICD-10-CM | POA: Insufficient documentation

## 2013-01-21 DIAGNOSIS — M25559 Pain in unspecified hip: Secondary | ICD-10-CM | POA: Insufficient documentation

## 2013-01-21 DIAGNOSIS — IMO0001 Reserved for inherently not codable concepts without codable children: Secondary | ICD-10-CM | POA: Insufficient documentation

## 2013-01-21 DIAGNOSIS — IMO0002 Reserved for concepts with insufficient information to code with codable children: Secondary | ICD-10-CM | POA: Insufficient documentation

## 2013-01-23 ENCOUNTER — Ambulatory Visit: Admitting: Physical Therapy

## 2013-01-28 ENCOUNTER — Ambulatory Visit: Admitting: Physical Therapy

## 2013-01-30 ENCOUNTER — Ambulatory Visit: Admitting: Rehabilitation

## 2013-02-04 ENCOUNTER — Ambulatory Visit: Admitting: Rehabilitation

## 2013-02-06 ENCOUNTER — Ambulatory Visit: Admitting: Physical Therapy

## 2013-02-11 ENCOUNTER — Ambulatory Visit: Admitting: Rehabilitation

## 2013-02-13 ENCOUNTER — Ambulatory Visit

## 2013-02-18 ENCOUNTER — Ambulatory Visit: Attending: Rehabilitation | Admitting: Physical Therapy

## 2013-02-18 DIAGNOSIS — IMO0001 Reserved for inherently not codable concepts without codable children: Secondary | ICD-10-CM | POA: Insufficient documentation

## 2013-02-18 DIAGNOSIS — IMO0002 Reserved for concepts with insufficient information to code with codable children: Secondary | ICD-10-CM | POA: Insufficient documentation

## 2013-02-18 DIAGNOSIS — M25559 Pain in unspecified hip: Secondary | ICD-10-CM | POA: Insufficient documentation

## 2013-02-18 DIAGNOSIS — M545 Low back pain, unspecified: Secondary | ICD-10-CM | POA: Insufficient documentation

## 2013-02-20 ENCOUNTER — Encounter

## 2015-05-28 ENCOUNTER — Encounter (HOSPITAL_COMMUNITY): Payer: Self-pay | Admitting: Emergency Medicine

## 2015-05-28 ENCOUNTER — Emergency Department (HOSPITAL_COMMUNITY)
Admission: EM | Admit: 2015-05-28 | Discharge: 2015-05-28 | Disposition: A | Discharge status: Home / Self Care | Attending: Emergency Medicine | Admitting: Emergency Medicine

## 2015-05-28 DIAGNOSIS — J40 Bronchitis, not specified as acute or chronic: Secondary | ICD-10-CM

## 2015-05-28 DIAGNOSIS — I10 Essential (primary) hypertension: Secondary | ICD-10-CM | POA: Insufficient documentation

## 2015-05-28 DIAGNOSIS — F17210 Nicotine dependence, cigarettes, uncomplicated: Secondary | ICD-10-CM | POA: Diagnosis not present

## 2015-05-28 DIAGNOSIS — E7800 Pure hypercholesterolemia, unspecified: Secondary | ICD-10-CM | POA: Diagnosis not present

## 2015-05-28 DIAGNOSIS — Z791 Long term (current) use of non-steroidal anti-inflammatories (NSAID): Secondary | ICD-10-CM | POA: Insufficient documentation

## 2015-05-28 DIAGNOSIS — E119 Type 2 diabetes mellitus without complications: Secondary | ICD-10-CM | POA: Diagnosis not present

## 2015-05-28 DIAGNOSIS — Z79899 Other long term (current) drug therapy: Secondary | ICD-10-CM | POA: Diagnosis not present

## 2015-05-28 DIAGNOSIS — Z7984 Long term (current) use of oral hypoglycemic drugs: Secondary | ICD-10-CM | POA: Diagnosis not present

## 2015-05-28 DIAGNOSIS — J209 Acute bronchitis, unspecified: Secondary | ICD-10-CM | POA: Diagnosis not present

## 2015-05-28 DIAGNOSIS — R05 Cough: Secondary | ICD-10-CM | POA: Diagnosis present

## 2015-05-28 MED ORDER — AZITHROMYCIN 250 MG PO TABS
ORAL_TABLET | ORAL | Status: DC
Start: 2015-05-29 — End: 2015-08-03

## 2015-05-28 MED ORDER — PREDNISONE 20 MG PO TABS
40.0000 mg | ORAL_TABLET | Freq: Once | ORAL | Status: AC
  Administered 2015-05-28: 40 mg via ORAL
  Filled 2015-05-28: qty 2

## 2015-05-28 MED ORDER — ALBUTEROL SULFATE HFA 108 (90 BASE) MCG/ACT IN AERS
2.0000 | INHALATION_SPRAY | Freq: Once | RESPIRATORY_TRACT | Status: AC
  Administered 2015-05-28: 2 via RESPIRATORY_TRACT
  Filled 2015-05-28: qty 6.7

## 2015-05-28 MED ORDER — PREDNISONE 20 MG PO TABS: 40.0000 mg | ORAL_TABLET | Freq: Every day | ORAL | Status: DC

## 2015-05-28 MED ORDER — ALBUTEROL SULFATE HFA 108 (90 BASE) MCG/ACT IN AERS: 2.0000 | INHALATION_SPRAY | Freq: Four times a day (QID) | RESPIRATORY_TRACT | Status: DC | PRN

## 2015-05-28 MED ORDER — AZITHROMYCIN 250 MG PO TABS
500.0000 mg | ORAL_TABLET | Freq: Once | ORAL | Status: AC
  Administered 2015-05-28: 500 mg via ORAL
  Filled 2015-05-28: qty 2

## 2015-05-28 NOTE — ED Notes (Signed)
Pt c/o cough x 3-4 weeks, white sputum. Fatigue, +emesis with coughing

## 2015-05-28 NOTE — ED Provider Notes (Signed)
CSN: [id number]     Arrival date & time 05/28/15  2110 History   First MD Initiated Contact with Patient 05/28/15 2129     Chief Complaint  Patient presents with  . Cough     (Consider location/radiation/quality/duration/timing/severity/associated sxs/prior Treatment) Patient is a 64 y.o. female presenting with cough.  Cough Cough characteristics:  Productive Sputum characteristics:  White Severity:  Mild Onset quality:  Gradual Duration:  3 weeks Timing:  Constant Progression:  Worsening Chronicity:  Recurrent Smoker: yes   Context: not animal exposure and not fumes   Associated symptoms: no chest pain, no chills, no ear pain and no wheezing     Past Medical History  Diagnosis Date  . Hypertension   . Diabetes mellitus   . High cholesterol    Past Surgical History  Procedure Laterality Date  . Hernia repair    . Foot surgery    . Cholecystectomy    . Cesarean section     No family history on file. Social History  Substance Use Topics  . Smoking status: Current Every Day Smoker    Types: Cigarettes  . Smokeless tobacco: None  . Alcohol Use: No   OB History    No data available     Review of Systems  Constitutional: Positive for fatigue. Negative for chills, activity change and appetite change.  HENT: Negative for ear pain.   Eyes: Negative for pain.  Respiratory: Positive for cough. Negative for wheezing.   Cardiovascular: Negative for chest pain.  Gastrointestinal: Negative for nausea and vomiting.  Endocrine: Negative for polydipsia and polyuria.  Genitourinary: Negative for dysuria and pelvic pain.  Musculoskeletal: Negative for back pain, gait problem and neck pain.  All other systems reviewed and are negative.     Allergies  Review of patient's allergies indicates no known allergies.  Home Medications   Prior to Admission medications   Medication Sig Start Date End Date Taking? Authorizing Provider  atorvastatin (LIPITOR) 10 MG tablet Take  10 mg by mouth daily.   Yes Historical Provider, MD  diltiazem (TIAZAC) 360 MG 24 hr capsule Take 360 mg by mouth daily.   Yes Historical Provider, MD  famotidine (PEPCID) 40 MG tablet Take 40 mg by mouth at bedtime.   Yes Historical Provider, MD  gabapentin (NEURONTIN) 300 MG capsule Take 300 mg by mouth 4 (four) times daily.   Yes Historical Provider, MD  irbesartan (AVAPRO) 150 MG tablet Take 150 mg by mouth 2 (two) times daily.   Yes Historical Provider, MD  meloxicam (MOBIC) 7.5 MG tablet Take 7.5 mg by mouth daily.   Yes Historical Provider, MD  sitaGLIPtan-metformin (JANUMET) 50-500 MG per tablet Take 1 tablet by mouth 2 (two) times daily with a meal.   Yes Historical Provider, MD  albuterol (PROVENTIL HFA;VENTOLIN HFA) 108 (90 BASE) MCG/ACT inhaler Inhale 2 puffs into the lungs every 6 (six) hours as needed for shortness of breath (cough). 05/28/15   Merrily Pew, MD  azithromycin (ZITHROMAX) 250 MG tablet Take one daily for four days 05/29/15   Merrily Pew, MD  predniSONE (DELTASONE) 20 MG tablet Take 2 tablets (40 mg total) by mouth daily with breakfast. 05/28/15   Merrily Pew, MD   BP 157/71 mmHg  Pulse 62  Temp(Src) 98 F (36.7 C) (Oral)  Resp 20  Ht 5\' 2"  (1.575 m)  Wt 175 lb (79.379 kg)  BMI 32.00 kg/m2  SpO2 94% Physical Exam  Constitutional: She is oriented to person, place, and time.  She appears well-developed and well-nourished.  HENT:  Head: Normocephalic and atraumatic.  Eyes: Pupils are equal, round, and reactive to light. Right eye exhibits no discharge. Left eye exhibits no discharge.  Neck: Normal range of motion.  Cardiovascular: Normal rate and regular rhythm.   Pulmonary/Chest: Effort normal. No stridor. No respiratory distress. She has no wheezes.  Abdominal: Soft. Bowel sounds are normal. She exhibits no distension. There is no tenderness.  Musculoskeletal: Normal range of motion. She exhibits no edema or tenderness.  Neurological: She is alert and oriented  to person, place, and time.  Skin: Skin is warm and dry.  Nursing note and vitals reviewed.   ED Course  Procedures (including critical care time) Labs Review Labs Reviewed - No data to display  Imaging Review No results found. I have personally reviewed and evaluated these images and lab results as part of my medical decision-making.   EKG Interpretation None      MDM   Final diagnoses:  Bronchitis    64 year old female with likely bronchitis. No evidence of sepsis or severe illness this time. Doubt PE. No asymmetrical lung findings, fever, tachypnea to image. We'll treat with albuterol, antitussives, steroid Dosepak and 2/2 length of symptoms and slight worsening recently, will start on abx as well.     Merrily Pew, MD 05/28/15 [phone]

## 2015-08-01 ENCOUNTER — Inpatient Hospital Stay (HOSPITAL_COMMUNITY)
Admission: EM | Admit: 2015-08-01 | Discharge: 2015-08-03 | DRG: 392 | Disposition: A | Discharge status: Home / Self Care | Attending: Internal Medicine | Admitting: Internal Medicine

## 2015-08-01 DIAGNOSIS — E1169 Type 2 diabetes mellitus with other specified complication: Secondary | ICD-10-CM | POA: Diagnosis present

## 2015-08-01 DIAGNOSIS — A084 Viral intestinal infection, unspecified: Principal | ICD-10-CM | POA: Diagnosis present

## 2015-08-01 DIAGNOSIS — Z833 Family history of diabetes mellitus: Secondary | ICD-10-CM | POA: Diagnosis not present

## 2015-08-01 DIAGNOSIS — Z9049 Acquired absence of other specified parts of digestive tract: Secondary | ICD-10-CM | POA: Diagnosis not present

## 2015-08-01 DIAGNOSIS — R112 Nausea with vomiting, unspecified: Secondary | ICD-10-CM | POA: Diagnosis present

## 2015-08-01 DIAGNOSIS — E119 Type 2 diabetes mellitus without complications: Secondary | ICD-10-CM

## 2015-08-01 DIAGNOSIS — E7800 Pure hypercholesterolemia, unspecified: Secondary | ICD-10-CM | POA: Diagnosis not present

## 2015-08-01 DIAGNOSIS — I10 Essential (primary) hypertension: Secondary | ICD-10-CM | POA: Diagnosis present

## 2015-08-01 DIAGNOSIS — J40 Bronchitis, not specified as acute or chronic: Secondary | ICD-10-CM | POA: Diagnosis not present

## 2015-08-01 DIAGNOSIS — Z7984 Long term (current) use of oral hypoglycemic drugs: Secondary | ICD-10-CM

## 2015-08-01 DIAGNOSIS — F17210 Nicotine dependence, cigarettes, uncomplicated: Secondary | ICD-10-CM | POA: Diagnosis not present

## 2015-08-01 DIAGNOSIS — E871 Hypo-osmolality and hyponatremia: Secondary | ICD-10-CM | POA: Diagnosis present

## 2015-08-01 DIAGNOSIS — E785 Hyperlipidemia, unspecified: Secondary | ICD-10-CM | POA: Diagnosis present

## 2015-08-01 DIAGNOSIS — E860 Dehydration: Secondary | ICD-10-CM | POA: Diagnosis present

## 2015-08-01 DIAGNOSIS — G8929 Other chronic pain: Secondary | ICD-10-CM | POA: Diagnosis present

## 2015-08-01 DIAGNOSIS — R197 Diarrhea, unspecified: Secondary | ICD-10-CM

## 2015-08-01 DIAGNOSIS — M549 Dorsalgia, unspecified: Secondary | ICD-10-CM | POA: Diagnosis not present

## 2015-08-02 ENCOUNTER — Ambulatory Visit (HOSPITAL_COMMUNITY)

## 2015-08-02 ENCOUNTER — Encounter (HOSPITAL_COMMUNITY): Payer: Self-pay | Admitting: *Deleted

## 2015-08-02 ENCOUNTER — Observation Stay (HOSPITAL_COMMUNITY)

## 2015-08-02 ENCOUNTER — Emergency Department (HOSPITAL_COMMUNITY)

## 2015-08-02 DIAGNOSIS — E871 Hypo-osmolality and hyponatremia: Secondary | ICD-10-CM | POA: Diagnosis not present

## 2015-08-02 DIAGNOSIS — Z9049 Acquired absence of other specified parts of digestive tract: Secondary | ICD-10-CM | POA: Diagnosis not present

## 2015-08-02 DIAGNOSIS — G8929 Other chronic pain: Secondary | ICD-10-CM | POA: Diagnosis present

## 2015-08-02 DIAGNOSIS — E119 Type 2 diabetes mellitus without complications: Secondary | ICD-10-CM | POA: Diagnosis present

## 2015-08-02 DIAGNOSIS — E785 Hyperlipidemia, unspecified: Secondary | ICD-10-CM

## 2015-08-02 DIAGNOSIS — Z833 Family history of diabetes mellitus: Secondary | ICD-10-CM | POA: Diagnosis not present

## 2015-08-02 DIAGNOSIS — I10 Essential (primary) hypertension: Secondary | ICD-10-CM | POA: Diagnosis present

## 2015-08-02 DIAGNOSIS — E1169 Type 2 diabetes mellitus with other specified complication: Secondary | ICD-10-CM | POA: Diagnosis present

## 2015-08-02 DIAGNOSIS — E860 Dehydration: Secondary | ICD-10-CM | POA: Diagnosis present

## 2015-08-02 DIAGNOSIS — E7800 Pure hypercholesterolemia, unspecified: Secondary | ICD-10-CM | POA: Diagnosis present

## 2015-08-02 DIAGNOSIS — A084 Viral intestinal infection, unspecified: Secondary | ICD-10-CM | POA: Diagnosis present

## 2015-08-02 DIAGNOSIS — R197 Diarrhea, unspecified: Secondary | ICD-10-CM

## 2015-08-02 DIAGNOSIS — R112 Nausea with vomiting, unspecified: Secondary | ICD-10-CM | POA: Diagnosis not present

## 2015-08-02 DIAGNOSIS — J40 Bronchitis, not specified as acute or chronic: Secondary | ICD-10-CM | POA: Diagnosis present

## 2015-08-02 DIAGNOSIS — M549 Dorsalgia, unspecified: Secondary | ICD-10-CM | POA: Diagnosis present

## 2015-08-02 DIAGNOSIS — D72829 Elevated white blood cell count, unspecified: Secondary | ICD-10-CM | POA: Diagnosis not present

## 2015-08-02 DIAGNOSIS — Z7984 Long term (current) use of oral hypoglycemic drugs: Secondary | ICD-10-CM | POA: Diagnosis not present

## 2015-08-02 DIAGNOSIS — F17210 Nicotine dependence, cigarettes, uncomplicated: Secondary | ICD-10-CM | POA: Diagnosis present

## 2015-08-02 LAB — CBC WITH DIFFERENTIAL/PLATELET
BASOS ABS: 0 10*3/uL (ref 0.0–0.1)
BASOS PCT: 0 %
Eosinophils Absolute: 0.1 10*3/uL (ref 0.0–0.7)
Eosinophils Relative: 1 %
HEMATOCRIT: 33 % — AB (ref 36.0–46.0)
HEMOGLOBIN: 10.5 g/dL — AB (ref 12.0–15.0)
Lymphocytes Relative: 24 %
Lymphs Abs: 2 10*3/uL (ref 0.7–4.0)
MCH: 23 pg — ABNORMAL LOW (ref 26.0–34.0)
MCHC: 31.8 g/dL (ref 30.0–36.0)
MCV: 72.2 fL — ABNORMAL LOW (ref 78.0–100.0)
MONO ABS: 0.6 10*3/uL (ref 0.1–1.0)
Monocytes Relative: 8 %
NEUTROS ABS: 5.6 10*3/uL (ref 1.7–7.7)
NEUTROS PCT: 67 %
Platelets: 437 10*3/uL — ABNORMAL HIGH (ref 150–400)
RBC: 4.57 MIL/uL (ref 3.87–5.11)
RDW: 15.1 % (ref 11.5–15.5)
WBC: 8.4 10*3/uL (ref 4.0–10.5)

## 2015-08-02 LAB — INFLUENZA PANEL BY PCR (TYPE A & B)
H1N1 flu by pcr: NOT DETECTED
INFLAPCR: NEGATIVE
INFLBPCR: NEGATIVE

## 2015-08-02 LAB — COMPREHENSIVE METABOLIC PANEL
ALBUMIN: 4.1 g/dL (ref 3.5–5.0)
ALK PHOS: 134 U/L — AB (ref 38–126)
ALK PHOS: 148 U/L — AB (ref 38–126)
ALT: 26 U/L (ref 14–54)
ALT: 25 U/L (ref 14–54)
ANION GAP: 10 (ref 5–15)
ANION GAP: 9 (ref 5–15)
AST: 19 U/L (ref 15–41)
AST: 25 U/L (ref 15–41)
Albumin: 3.6 g/dL (ref 3.5–5.0)
BILIRUBIN TOTAL: 0.4 mg/dL (ref 0.3–1.2)
BILIRUBIN TOTAL: 0.9 mg/dL (ref 0.3–1.2)
BUN: <5 mg/dL — ABNORMAL LOW (ref 6–20)
BUN: <5 mg/dL — ABNORMAL LOW (ref 6–20)
CALCIUM: 8.6 mg/dL — AB (ref 8.9–10.3)
CALCIUM: 9.2 mg/dL (ref 8.9–10.3)
CO2: 23 mmol/L (ref 22–32)
CO2: 23 mmol/L (ref 22–32)
CREATININE: 0.69 mg/dL (ref 0.44–1.00)
Chloride: 90 mmol/L — ABNORMAL LOW (ref 101–111)
Chloride: 94 mmol/L — ABNORMAL LOW (ref 101–111)
Creatinine, Ser: 0.57 mg/dL (ref 0.44–1.00)
GFR calc Af Amer: >60 mL/min (ref >60)
GFR calc non Af Amer: >60 mL/min (ref >60)
GFR calc non Af Amer: >60 mL/min (ref >60)
Glucose, Bld: 169 mg/dL — ABNORMAL HIGH (ref 65–99)
Glucose, Bld: 118 mg/dL — ABNORMAL HIGH (ref 65–99)
Potassium: 3.9 mmol/L (ref 3.5–5.1)
Potassium: 3.9 mmol/L (ref 3.5–5.1)
Sodium: 123 mmol/L — ABNORMAL LOW (ref 135–145)
Sodium: 126 mmol/L — ABNORMAL LOW (ref 135–145)
TOTAL PROTEIN: 6.8 g/dL (ref 6.5–8.1)
TOTAL PROTEIN: 7.7 g/dL (ref 6.5–8.1)

## 2015-08-02 LAB — GASTROINTESTINAL PANEL BY PCR, STOOL (REPLACES STOOL CULTURE)
Adenovirus F40/41: NOT DETECTED
Astrovirus: NOT DETECTED
CRYPTOSPORIDIUM: NOT DETECTED
Campylobacter species: NOT DETECTED
Cyclospora cayetanensis: NOT DETECTED
E. coli O157: NOT DETECTED
ENTAMOEBA HISTOLYTICA: NOT DETECTED
ENTEROAGGREGATIVE E COLI (EAEC): NOT DETECTED
Enteropathogenic E coli (EPEC): NOT DETECTED
Enterotoxigenic E coli (ETEC): NOT DETECTED
GIARDIA LAMBLIA: NOT DETECTED
Norovirus GI/GII: NOT DETECTED
Plesimonas shigelloides: NOT DETECTED
Rotavirus A: NOT DETECTED
SALMONELLA SPECIES: NOT DETECTED
SHIGELLA/ENTEROINVASIVE E COLI (EIEC): NOT DETECTED
Sapovirus (I, II, IV, and V): NOT DETECTED
Shiga like toxin producing E coli (STEC): NOT DETECTED
VIBRIO CHOLERAE: NOT DETECTED
Vibrio species: NOT DETECTED
YERSINIA ENTEROCOLITICA: NOT DETECTED

## 2015-08-02 LAB — URINALYSIS, ROUTINE W REFLEX MICROSCOPIC
Bilirubin Urine: NEGATIVE
Glucose, UA: NEGATIVE mg/dL
HGB URINE DIPSTICK: NEGATIVE
Ketones, ur: NEGATIVE mg/dL
Leukocytes, UA: NEGATIVE
NITRITE: NEGATIVE
PROTEIN: NEGATIVE mg/dL
Specific Gravity, Urine: 1.008 (ref 1.005–1.030)
pH: 7.5 (ref 5.0–8.0)

## 2015-08-02 LAB — LIPASE, BLOOD: Lipase: 29 U/L (ref 11–51)

## 2015-08-02 LAB — GLUCOSE, CAPILLARY
GLUCOSE-CAPILLARY: 100 mg/dL — AB (ref 65–99)
Glucose-Capillary: 101 mg/dL — ABNORMAL HIGH (ref 65–99)

## 2015-08-02 LAB — SODIUM, URINE, RANDOM: SODIUM UR: 24 mmol/L

## 2015-08-02 LAB — CBC
HCT: 36.7 % (ref 36.0–46.0)
HEMOGLOBIN: 11.9 g/dL — AB (ref 12.0–15.0)
MCH: 22.8 pg — AB (ref 26.0–34.0)
MCHC: 32.4 g/dL (ref 30.0–36.0)
MCV: 70.3 fL — ABNORMAL LOW (ref 78.0–100.0)
PLATELETS: 477 10*3/uL — AB (ref 150–400)
RBC: 5.22 MIL/uL — AB (ref 3.87–5.11)
RDW: 15 % (ref 11.5–15.5)
WBC: 14.4 10*3/uL — AB (ref 4.0–10.5)

## 2015-08-02 LAB — C DIFFICILE QUICK SCREEN W PCR REFLEX
C DIFFICILE (CDIFF) INTERP: NEGATIVE
C DIFFICILE (CDIFF) TOXIN: NEGATIVE
C Diff antigen: NEGATIVE

## 2015-08-02 LAB — TSH: TSH: 0.66 u[IU]/mL (ref 0.350–4.500)

## 2015-08-02 LAB — BRAIN NATRIURETIC PEPTIDE: B NATRIURETIC PEPTIDE 5: 90.5 pg/mL (ref 0.0–100.0)

## 2015-08-02 LAB — BASIC METABOLIC PANEL
Anion gap: 8 (ref 5–15)
CALCIUM: 8.9 mg/dL (ref 8.9–10.3)
CO2: 23 mmol/L (ref 22–32)
CREATININE: 0.59 mg/dL (ref 0.44–1.00)
Chloride: 95 mmol/L — ABNORMAL LOW (ref 101–111)
GFR calc non Af Amer: >60 mL/min (ref >60)
Glucose, Bld: 132 mg/dL — ABNORMAL HIGH (ref 65–99)
Potassium: 4.3 mmol/L (ref 3.5–5.1)
Sodium: 126 mmol/L — ABNORMAL LOW (ref 135–145)

## 2015-08-02 LAB — OSMOLALITY, URINE: OSMOLALITY UR: 131 mosm/kg — AB (ref 300–900)

## 2015-08-02 MED ORDER — ENOXAPARIN SODIUM 40 MG/0.4ML ~~LOC~~ SOLN
40.0000 mg | SUBCUTANEOUS | Status: DC
  Administered 2015-08-02 – 2015-08-03 (×2): 40 mg via SUBCUTANEOUS
  Filled 2015-08-02 (×2): qty 0.4

## 2015-08-02 MED ORDER — IOHEXOL 300 MG/ML  SOLN
100.0000 mL | Freq: Once | INTRAMUSCULAR | Status: AC | PRN
  Administered 2015-08-02: 100 mL via INTRAVENOUS

## 2015-08-02 MED ORDER — PNEUMOCOCCAL VAC POLYVALENT 25 MCG/0.5ML IJ INJ
0.5000 mL | INJECTION | INTRAMUSCULAR | Status: AC
  Administered 2015-08-03: 0.5 mL via INTRAMUSCULAR
  Filled 2015-08-02 (×2): qty 0.5

## 2015-08-02 MED ORDER — FLUTICASONE PROPIONATE 50 MCG/ACT NA SUSP
2.0000 | Freq: Two times a day (BID) | NASAL | Status: DC
  Administered 2015-08-02 – 2015-08-03 (×3): 2 via NASAL
  Filled 2015-08-02: qty 16

## 2015-08-02 MED ORDER — IRBESARTAN 150 MG PO TABS
150.0000 mg | ORAL_TABLET | Freq: Two times a day (BID) | ORAL | Status: DC
  Administered 2015-08-02 – 2015-08-03 (×3): 150 mg via ORAL
  Filled 2015-08-02 (×4): qty 1

## 2015-08-02 MED ORDER — DILTIAZEM HCL ER BEADS 240 MG PO CP24
360.0000 mg | ORAL_CAPSULE | Freq: Every day | ORAL | Status: DC
  Administered 2015-08-02 – 2015-08-03 (×2): 360 mg via ORAL
  Filled 2015-08-02 (×2): qty 1

## 2015-08-02 MED ORDER — ONDANSETRON HCL 4 MG/2ML IJ SOLN: 4.0000 mg | Freq: Four times a day (QID) | INTRAMUSCULAR | Status: DC | PRN

## 2015-08-02 MED ORDER — ONDANSETRON HCL 4 MG PO TABS: 4.0000 mg | ORAL_TABLET | Freq: Four times a day (QID) | ORAL | Status: DC | PRN

## 2015-08-02 MED ORDER — ALBUTEROL SULFATE (2.5 MG/3ML) 0.083% IN NEBU
2.5000 mg | INHALATION_SOLUTION | RESPIRATORY_TRACT | Status: DC | PRN
  Administered 2015-08-02: 2.5 mg via RESPIRATORY_TRACT
  Filled 2015-08-02: qty 3

## 2015-08-02 MED ORDER — GABAPENTIN 300 MG PO CAPS
300.0000 mg | ORAL_CAPSULE | Freq: Four times a day (QID) | ORAL | Status: DC
  Administered 2015-08-02 – 2015-08-03 (×4): 300 mg via ORAL
  Filled 2015-08-02 (×8): qty 1

## 2015-08-02 MED ORDER — HYDRALAZINE HCL 20 MG/ML IJ SOLN: 10.0000 mg | INTRAMUSCULAR | Status: DC | PRN

## 2015-08-02 MED ORDER — ALBUTEROL SULFATE (2.5 MG/3ML) 0.083% IN NEBU: 2.5000 mg | INHALATION_SOLUTION | Freq: Four times a day (QID) | RESPIRATORY_TRACT | Status: DC | PRN

## 2015-08-02 MED ORDER — ACETAMINOPHEN 650 MG RE SUPP: 650.0000 mg | Freq: Four times a day (QID) | RECTAL | Status: DC | PRN

## 2015-08-02 MED ORDER — ACETAMINOPHEN 325 MG PO TABS
650.0000 mg | ORAL_TABLET | Freq: Four times a day (QID) | ORAL | Status: DC | PRN
  Administered 2015-08-02: 650 mg via ORAL
  Filled 2015-08-02: qty 2

## 2015-08-02 MED ORDER — IOHEXOL 300 MG/ML  SOLN
50.0000 mL | INTRAMUSCULAR | Status: AC
  Administered 2015-08-02 (×2): 50 mL via ORAL

## 2015-08-02 MED ORDER — LINAGLIPTIN 5 MG PO TABS
5.0000 mg | ORAL_TABLET | Freq: Every day | ORAL | Status: DC
  Administered 2015-08-02 – 2015-08-03 (×2): 5 mg via ORAL
  Filled 2015-08-02 (×2): qty 1

## 2015-08-02 MED ORDER — ONDANSETRON HCL 4 MG/2ML IJ SOLN
4.0000 mg | Freq: Once | INTRAMUSCULAR | Status: AC
  Administered 2015-08-02: 4 mg via INTRAVENOUS
  Filled 2015-08-02: qty 2

## 2015-08-02 MED ORDER — INSULIN ASPART 100 UNIT/ML ~~LOC~~ SOLN
0.0000 [IU] | Freq: Three times a day (TID) | SUBCUTANEOUS | Status: DC
  Administered 2015-08-02 – 2015-08-03 (×2): 1 [IU] via SUBCUTANEOUS

## 2015-08-02 MED ORDER — SODIUM CHLORIDE 0.9 % IV SOLN
INTRAVENOUS | Status: AC
  Administered 2015-08-02: 04:00:00 via INTRAVENOUS

## 2015-08-02 MED ORDER — FAMOTIDINE 40 MG PO TABS
40.0000 mg | ORAL_TABLET | Freq: Every day | ORAL | Status: DC
  Administered 2015-08-02: 40 mg via ORAL
  Filled 2015-08-02 (×2): qty 1

## 2015-08-02 MED ORDER — SODIUM CHLORIDE 0.9 % IV BOLUS (SEPSIS)
500.0000 mL | Freq: Once | INTRAVENOUS | Status: AC
  Administered 2015-08-02: 500 mL via INTRAVENOUS

## 2015-08-02 MED ORDER — ATORVASTATIN CALCIUM 10 MG PO TABS
10.0000 mg | ORAL_TABLET | Freq: Every day | ORAL | Status: DC
  Administered 2015-08-02 – 2015-08-03 (×2): 10 mg via ORAL
  Filled 2015-08-02 (×2): qty 1

## 2015-08-02 NOTE — ED Provider Notes (Signed)
CSN: [id number]     Arrival date & time 08/01/15  2250 History   First MD Initiated Contact with Patient 08/02/15 0130     Chief Complaint  Patient presents with  . Cough  . Abdominal Pain     (Consider location/radiation/quality/duration/timing/severity/associated sxs/prior Treatment) HPI 65 y.o.'  Patient has a PMH of hypertension, diabetes, high cholesterol, arthritis and Chronic back pain. She also has hx of cholecystectomy. Patient comes to the emergency department complaining of nausea, vomiting, diarrhea, generalized weakness, cough, generalized abdominal pain the past 2-3 days. She reports that she has been unable to keep any food or fluids down. She is a diabetic but says he sugars have been normal, she then reports she does not check them very often.   ROS: The patient denies diaphoresis, fever, headache, weakness (focal), confusion, change of vision,  dysphagia, aphagia, shortness of breath, lower extremity swelling, rash, neck pain, chest pain   Past Medical History  Diagnosis Date  . Hypertension   . Diabetes mellitus   . High cholesterol   . Arthritis   . Chronic back pain    Past Surgical History  Procedure Laterality Date  . Hernia repair    . Foot surgery    . Cholecystectomy    . Cesarean section     No family history on file. Social History  Substance Use Topics  . Smoking status: Current Some Day Smoker    Types: Cigarettes  . Smokeless tobacco: None  . Alcohol Use: No   OB History    No data available     Review of Systems  Review of Systems All other systems negative except as documented in the HPI. All pertinent positives and negatives as reviewed in the HPI.   Allergies  Review of patient's allergies indicates no known allergies.  Home Medications   Prior to Admission medications   Medication Sig Start Date End Date Taking? Authorizing Provider  albuterol (PROVENTIL HFA;VENTOLIN HFA) 108 (90 BASE) MCG/ACT inhaler Inhale 2 puffs into  the lungs every 6 (six) hours as needed for shortness of breath (cough). 05/28/15  Yes Merrily Pew, MD  atorvastatin (LIPITOR) 10 MG tablet Take 10 mg by mouth daily.   Yes Historical Provider, MD  diltiazem (TIAZAC) 360 MG 24 hr capsule Take 360 mg by mouth daily.   Yes Historical Provider, MD  Diphenhydramine-PE-APAP (THERAFLU SEVERE COLD/CGH NIGHT) 25-10-650 MG PACK Take 1 packet by mouth every 4 (four) hours as needed (for cough/cold).   Yes Historical Provider, MD  DM-Phenylephrine-Acetaminophen (VICKS DAYQUIL COLD & FLU) 10-5-325 MG CAPS Take 2 capsules by mouth every 6 (six) hours as needed (for cough).   Yes Historical Provider, MD  famotidine (PEPCID) 40 MG tablet Take 40 mg by mouth at bedtime.   Yes Historical Provider, MD  fluticasone (FLONASE) 50 MCG/ACT nasal spray Place 2 sprays into both nostrils 2 (two) times daily.   Yes Historical Provider, MD  gabapentin (NEURONTIN) 300 MG capsule Take 300 mg by mouth 4 (four) times daily.   Yes Historical Provider, MD  irbesartan (AVAPRO) 150 MG tablet Take 150 mg by mouth 2 (two) times daily.   Yes Historical Provider, MD  meloxicam (MOBIC) 7.5 MG tablet Take 7.5 mg by mouth daily.   Yes Historical Provider, MD  naproxen sodium (ANAPROX) 220 MG tablet Take 220 mg by mouth 2 (two) times daily as needed (for pain).   Yes Historical Provider, MD  pantoprazole (PROTONIX) 40 MG tablet Take 40 mg by mouth daily.  Yes Historical Provider, MD  Phenylephrine-DM-GG (EQ TUSSIN CF COUGH & COLD PO) Take 10 mLs by mouth every 4 (four) hours as needed (for cough).   Yes Historical Provider, MD  sitaGLIPtan-metformin (JANUMET) 50-500 MG per tablet Take 1 tablet by mouth 2 (two) times daily with a meal.   Yes Historical Provider, MD  azithromycin (ZITHROMAX) 250 MG tablet Take one daily for four days Patient not taking: Reported on 08/02/2015 05/29/15   Merrily Pew, MD  predniSONE (DELTASONE) 20 MG tablet Take 2 tablets (40 mg total) by mouth daily with  breakfast. Patient not taking: Reported on 08/02/2015 05/28/15   Merrily Pew, MD   BP 178/72 mmHg  Pulse 67  Temp(Src) 98.7 F (37.1 C) (Oral)  Resp 18  SpO2 93% Physical Exam  Constitutional: She appears well-developed and well-nourished. No distress.  HENT:  Head: Normocephalic and atraumatic.  Right Ear: Tympanic membrane and ear canal normal.  Left Ear: Tympanic membrane and ear canal normal.  Nose: Nose normal.  Mouth/Throat: Uvula is midline, oropharynx is clear and moist and mucous membranes are normal.  Eyes: Pupils are equal, round, and reactive to light.  Neck: Normal range of motion. Neck supple.  Cardiovascular: Normal rate and regular rhythm.   Pulmonary/Chest: Effort normal.  Abdominal: Soft. She exhibits no distension. Bowel sounds are increased. There is tenderness (diffuse, no guarding). There is no rigidity, no rebound, no guarding and no CVA tenderness.  Musculoskeletal:  No LE swelling  Neurological: She is alert.  Acting at baseline  Skin: Skin is warm and dry. No rash noted.  Nursing note and vitals reviewed.   ED Course  Procedures (including critical care time) Labs Review Labs Reviewed  COMPREHENSIVE METABOLIC PANEL - Abnormal; Notable for the following:    Sodium 123 (*)    Chloride 90 (*)    Glucose, Bld 169 (*)    BUN <5 (*)    Alkaline Phosphatase 148 (*)    All other components within normal limits  CBC - Abnormal; Notable for the following:    WBC 14.4 (*)    RBC 5.22 (*)    Hemoglobin 11.9 (*)    MCV 70.3 (*)    MCH 22.8 (*)    Platelets 477 (*)    All other components within normal limits  LIPASE, BLOOD  URINALYSIS, ROUTINE W REFLEX MICROSCOPIC (NOT AT [facility])    Imaging Review Dg Chest 2 View  08/02/2015  CLINICAL DATA:  Cough, weakness, abdominal pain, nausea, vomiting, and diarrhea for 3 days. EXAM: CHEST  2 VIEW COMPARISON:  07/18/2007 FINDINGS: Borderline heart size with normal pulmonary vascularity. Slight fibrosis in the lung  bases. No focal consolidation or airspace disease. No blunting of costophrenic angles. No pneumothorax. Tortuous aorta. Degenerative changes in the spine and shoulders. Surgical clips in the right upper quadrant. 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 No evidence of active pulmonary disease. Electronically Signed   By: Lucienne Capers M.D.   On: 08/02/2015 02:09   I have personally reviewed and evaluated these images and lab results as part of my medical decision-making.   EKG Interpretation None      MDM   Final diagnoses:  Dehydration  Hyponatremia  Nausea vomiting and diarrhea    Pt has low sodium and is feeling weak, unable to keep any food or fluids down. I feel that she will likely benefit from admission and rehydration. Discussed case with Dr. Tomi Bamberger, I. Who has seen the patient as well.  Admit, obs,  [address] [facility]    Delos Haring, PA-C 08/02/15 [place], MD 08/02/15 [phone]

## 2015-08-02 NOTE — Progress Notes (Signed)
Patient admitted after midnight, please refer to admission note completed 08/02/2015 for further details.  Patient is a 65 year old female with past medical history significant for diabetes, dyslipidemia, hypertension who presented to [facility] with intractable nausea, vomiting and diarrhea over last 2 days prior to this admission associated with poor by mouth intake. No reports of fevers. Her chest x-ray on the admission was unremarkable. KUB showed possible ileus. Her blood work was significant for white blood cell count of 14.4, hemoglobin 11.9, sodium 123, normal creatinine.  Assessment and plan:  Nausea, vomiting and diarrhea / leukocytosis - Likely viral gastroenteritis - She may have possible ileus which was seen on KUB but so far she doesn't seem to be obstructed - Her CT abdomen is pending - Continue supportive care with fluids for hydration - Continue antiemetics as needed - Obtain stool studies, they're pending at this time  Leisa Lenz [facility] [id number]

## 2015-08-02 NOTE — ED Notes (Addendum)
Pt c/o cough, generalized weakness, abd pain N/V/D x 2-3 days; pt states that she has a productive cough with white sputum; pt c/o generalized body aches and feeling tired; pt states that she has had multiple episodes of diarrhea and nausea and states "every time I eat I throw up"; pt c/o "feeling sweaty" a lot

## 2015-08-02 NOTE — H&P (Addendum)
[facility] History and Physical  Patrick Veness [id number] DOB: [birth year] DOA: 08/01/2015  Referring physician: Ms. Nyoka Cowden. PA. PCP: Wonda Cerise, MD  Specialists: None.  Chief Complaint: Nausea vomiting and diarrhea.  HPI: Rhonda Blackwell is a 65 y.o. female with history of diabetes mellitus, hyperlipidemia, hypertension and chronic back pain presents to the ER because of nausea vomiting and diarrhea. Patient has been having these symptoms over the last 2 days. Patient states patient has had multiple episodes of nausea vomiting and diarrhea denies any abdominal pain. Patient states patient also has been having some cough with shortness of breath over the last 5 days. Chest x-ray was unremarkable. KUB shows possible ileus. On exam abdomen appears benign. Blood work shows hyponatremia at around 123. Patient has been admitted for further management.   Review of Systems: As presented in the history of presenting illness, rest negative.  Past Medical History  Diagnosis Date  . Hypertension   . Diabetes mellitus   . High cholesterol   . Arthritis   . Chronic back pain    Past Surgical History  Procedure Laterality Date  . Hernia repair    . Foot surgery    . Cholecystectomy    . Cesarean section     Social History:  reports that she has been smoking Cigarettes.  She does not have any smokeless tobacco history on file. She reports that she does not drink alcohol or use illicit drugs. Where does patient live home. Can patient participate in ADLs? Yes.  No Known Allergies  Family History:  Family History  Problem Relation Age of Onset  . Diabetes Mellitus II Sister       Prior to Admission medications   Medication Sig Start Date End Date Taking? Authorizing Provider  albuterol (PROVENTIL HFA;VENTOLIN HFA) 108 (90 BASE) MCG/ACT inhaler Inhale 2 puffs into the lungs every 6 (six) hours as needed for shortness of breath (cough). 05/28/15  Yes Merrily Pew, MD  atorvastatin  (LIPITOR) 10 MG tablet Take 10 mg by mouth daily.   Yes Historical Provider, MD  diltiazem (TIAZAC) 360 MG 24 hr capsule Take 360 mg by mouth daily.   Yes Historical Provider, MD  Diphenhydramine-PE-APAP (THERAFLU SEVERE COLD/CGH NIGHT) 25-10-650 MG PACK Take 1 packet by mouth every 4 (four) hours as needed (for cough/cold).   Yes Historical Provider, MD  DM-Phenylephrine-Acetaminophen (VICKS DAYQUIL COLD & FLU) 10-5-325 MG CAPS Take 2 capsules by mouth every 6 (six) hours as needed (for cough).   Yes Historical Provider, MD  famotidine (PEPCID) 40 MG tablet Take 40 mg by mouth at bedtime.   Yes Historical Provider, MD  fluticasone (FLONASE) 50 MCG/ACT nasal spray Place 2 sprays into both nostrils 2 (two) times daily.   Yes Historical Provider, MD  gabapentin (NEURONTIN) 300 MG capsule Take 300 mg by mouth 4 (four) times daily.   Yes Historical Provider, MD  irbesartan (AVAPRO) 150 MG tablet Take 150 mg by mouth 2 (two) times daily.   Yes Historical Provider, MD  meloxicam (MOBIC) 7.5 MG tablet Take 7.5 mg by mouth daily.   Yes Historical Provider, MD  naproxen sodium (ANAPROX) 220 MG tablet Take 220 mg by mouth 2 (two) times daily as needed (for pain).   Yes Historical Provider, MD  pantoprazole (PROTONIX) 40 MG tablet Take 40 mg by mouth daily.   Yes Historical Provider, MD  Phenylephrine-DM-GG (EQ TUSSIN CF COUGH & COLD PO) Take 10 mLs by mouth every 4 (four) hours as needed (for cough).  Yes Historical Provider, MD  sitaGLIPtan-metformin (JANUMET) 50-500 MG per tablet Take 1 tablet by mouth 2 (two) times daily with a meal.   Yes Historical Provider, MD  azithromycin (ZITHROMAX) 250 MG tablet Take one daily for four days Patient not taking: Reported on 08/02/2015 05/29/15   Merrily Pew, MD  predniSONE (DELTASONE) 20 MG tablet Take 2 tablets (40 mg total) by mouth daily with breakfast. Patient not taking: Reported on 08/02/2015 05/28/15   Merrily Pew, MD    Physical Exam: Filed Vitals:    08/01/15 2347 08/02/15 0246 08/02/15 0500 08/02/15 0518  BP: 205/80 178/72  154/67  Pulse: 76 67  71  Temp: 98.4 F (36.9 C) 98.7 F (37.1 C)  97.7 F (36.5 C)  TempSrc: Oral Oral  Oral  Resp: 22 18  18   Height:   5\' 3"  (1.6 m)   Weight:   77.066 kg (169 lb 14.4 oz)   SpO2: 93% 93%  93%     General:  Moderately built and nourished.  Eyes: Anicteric no pallor.  ENT: No discharge from the ears eyes nose and mouth.  Neck: No JVD appreciated. No mass felt.  Cardiovascular: S1 and S2 heard.  Respiratory: No rhonchi or crepitations.  Abdomen: Soft nontender bowel sounds present no guarding or rigidity.  Skin: No rash.  Musculoskeletal: No edema.  Psychiatric: Appears normal.  Neurologic: Alert awake oriented to time place and person. Moves all extremities.  Labs on Admission:  Basic Metabolic Panel:  Recent Labs Lab 08/02/15 0045  NA 123*  K 3.9  CL 90*  CO2 23  GLUCOSE 169*  BUN <5*  CREATININE 0.69  CALCIUM 9.2   Liver Function Tests:  Recent Labs Lab 08/02/15 0045  AST 25  ALT 26  ALKPHOS 148*  BILITOT 0.9  PROT 7.7  ALBUMIN 4.1    Recent Labs Lab 08/02/15 0045  LIPASE 29   No results for input(s): AMMONIA in the last 168 hours. CBC:  Recent Labs Lab 08/02/15 0045  WBC 14.4*  HGB 11.9*  HCT 36.7  MCV 70.3*  PLT 477*   Cardiac Enzymes: No results for input(s): CKTOTAL, CKMB, CKMBINDEX, TROPONINI in the last 168 hours.  BNP (last 3 results) No results for input(s): BNP in the last 8760 hours.  ProBNP (last 3 results) No results for input(s): PROBNP in the last 8760 hours.  CBG: No results for input(s): GLUCAP in the last 168 hours.  Radiological Exams on Admission: Dg Chest 2 View  08/02/2015  CLINICAL DATA:  Cough, weakness, abdominal pain, nausea, vomiting, and diarrhea for 3 days. EXAM: CHEST  2 VIEW COMPARISON:  07/18/2007 FINDINGS: Borderline heart size with normal pulmonary vascularity. Slight fibrosis in the lung bases.  No focal consolidation or airspace disease. No blunting of costophrenic angles. No pneumothorax. Tortuous aorta. Degenerative changes in the spine and shoulders. Surgical clips in the right upper quadrant. 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 No evidence of active pulmonary disease. Electronically Signed   By: Lucienne Capers M.D.   On: 08/02/2015 02:09   Dg Abd 1 View  08/02/2015  CLINICAL DATA:  Nausea, vomiting, and diarrhea.  Weakness. EXAM: ABDOMEN - 1 VIEW COMPARISON:  02/22/2012 FINDINGS: Gas-filled small and large bowel throughout. No small or large bowel distention. Changes likely to indicate ileus. Can't exclude enteritis. Surgical clips in the right upper quadrant. No radiopaque stones. Vascular calcifications. Degenerative changes in the spine and hips. IMPRESSION: Diffuse gas-filled small and large bowel without distention suggesting ileus. Electronically  Signed   By: Lucienne Capers M.D.   On: 08/02/2015 04:43     Assessment/Plan Principal Problem:   Nausea vomiting and diarrhea Active Problems:   Hyponatremia   DM (diabetes mellitus) (HCC)   HTN (hypertension)   Nausea & vomiting   1. Nausea vomiting and diarrhea - patient states he has taken antibiotics in December 2016 for bronchitis. Denies any recent travel or sick contacts. Check stool for C. difficile and GI pathogen bowel. Since KUB shows possible ileus will check CT abdomen and pelvis. Patient has been placed on clear liquid diet for now. 2. Hyponatremia - suspect most likely dehydration. Check urine sodium osmolality. Continue with gentle hydration for now and closely follow metabolic panel and further recommendations based on metabolic panel trend and urine studies. Check TSH. 3. Bronchitis - on PRN albuterol. 4. Diabetes mellitus type 2 - continue Januvia but hold metformin Jamas Lav patient continue with sliding scale coverage. 5. Hypertension - will continue home medications. 6. Hyperlipidemia on statins.   DVT  Prophylaxis Lovenox.  Code Status: Full code.  Family Communication: Discussed with patient.  Disposition Plan: Admit to inpatient.    Sumner Boesch N. [facility] Pager [phone].  If 7PM-7AM, please contact night-coverage [email or web address] Password [facility] 08/02/2015, 5:26 AM

## 2015-08-03 DIAGNOSIS — E871 Hypo-osmolality and hyponatremia: Secondary | ICD-10-CM

## 2015-08-03 DIAGNOSIS — D72829 Elevated white blood cell count, unspecified: Secondary | ICD-10-CM

## 2015-08-03 LAB — GLUCOSE, CAPILLARY: Glucose-Capillary: 128 mg/dL — ABNORMAL HIGH (ref 65–99)

## 2015-08-03 NOTE — Discharge Instructions (Signed)

## 2015-08-03 NOTE — Discharge Summary (Signed)
Physician Discharge Summary  Rhonda Blackwell [id number] DOB: 1950/12/30 DOA: 08/01/2015  PCP: Wonda Cerise, MD  Admit date: 08/01/2015 Discharge date: 08/03/2015  Recommendations for Outpatient Follow-up:  1. No changes in medications on discharge 2. Pt will follow up with PCP in 1-2 weeks after discharge   Discharge Diagnoses:  Principal Problem:   Nausea vomiting and diarrhea Active Problems:   Hyponatremia   HTN (hypertension)   Dyslipidemia associated with type 2 diabetes mellitus ([place])   Controlled type 2 diabetes mellitus without complication, without long-term current use of insulin ([place])    Discharge Condition: stable   Diet recommendation: as tolerated   History of present illness:  65 year old female with past medical history significant for diabetes, dyslipidemia, hypertension who presented to [facility] long hospital with intractable nausea, vomiting and diarrhea over last 2 days prior to this admission associated with poor by mouth intake. No reports of fevers. Her chest x-ray on the admission was unremarkable. KUB showed possible ileus. Her blood work was significant for white blood cell count of 14.4, hemoglobin 11.9, sodium 123, normal creatinine.  Hospital Course:   Assessment and plan:  Nausea, vomiting and diarrhea / leukocytosis - Likely viral gastroenteritis - No acute findings on CT abd - Stool studies including C.dfif and GI pathogen panel all negative - No further N/V/D - Stable for discharge   Hyponatremia - Due to dehydration from GI losses - Stable at 126 and will likely improve further - She wants to go home and not willing to stay additional day to recheck sodium     Signed:  Leisa Lenz, MD  [facility] 08/03/2015, 10:33 AM  Pager #: [phone]  Time spent in minutes: more than 30 minutes    Discharge Exam: Filed Vitals:   08/02/15 2111 08/03/15 0457  BP: 162/66 155/63  Pulse: 67 62  Temp: 98.5 F (36.9 C) 97.6 F  (36.4 C)  Resp: 17 16   Filed Vitals:   08/02/15 0518 08/02/15 1400 08/02/15 2111 08/03/15 0457  BP: 154/67 173/71 162/66 155/63  Pulse: 71 65 67 62  Temp: 97.7 F (36.5 C) 98.5 F (36.9 C) 98.5 F (36.9 C) 97.6 F (36.4 C)  TempSrc: Oral Oral Oral Oral  Resp: 18 18 17 16   Height:      Weight:      SpO2: 93% 94% 96% 90%    General: Pt is alert, follows commands appropriately, not in acute distress Cardiovascular: Regular rate and rhythm, S1/S2 +, no murmurs Respiratory: Clear to auscultation bilaterally, no wheezing, no crackles, no rhonchi Abdominal: Soft, non tender, non distended, bowel sounds +, no guarding Extremities: no edema, no cyanosis, pulses palpable bilaterally DP and PT Neuro: Grossly nonfocal  Discharge Instructions  Discharge Instructions    Call MD for:  difficulty breathing, headache or visual disturbances    Complete by:  As directed      Call MD for:  persistant dizziness or light-headedness    Complete by:  As directed      Call MD for:  persistant nausea and vomiting    Complete by:  As directed      Call MD for:  severe uncontrolled pain    Complete by:  As directed      Diet - low sodium heart healthy    Complete by:  As directed      Increase activity slowly    Complete by:  As directed  Medication List    STOP taking these medications        azithromycin 250 MG tablet  Commonly known as:  ZITHROMAX     predniSONE 20 MG tablet  Commonly known as:  DELTASONE      TAKE these medications        albuterol 108 (90 Base) MCG/ACT inhaler  Commonly known as:  PROVENTIL HFA;VENTOLIN HFA  Inhale 2 puffs into the lungs every 6 (six) hours as needed for shortness of breath (cough).     atorvastatin 10 MG tablet  Commonly known as:  LIPITOR  Take 10 mg by mouth daily.     diltiazem 360 MG 24 hr capsule  Commonly known as:  TIAZAC  Take 360 mg by mouth daily.     EQ TUSSIN CF COUGH & COLD PO  Take 10 mLs by mouth every 4  (four) hours as needed (for cough).     famotidine 40 MG tablet  Commonly known as:  PEPCID  Take 40 mg by mouth at bedtime.     fluticasone 50 MCG/ACT nasal spray  Commonly known as:  FLONASE  Place 2 sprays into both nostrils 2 (two) times daily.     gabapentin 300 MG capsule  Commonly known as:  NEURONTIN  Take 300 mg by mouth 4 (four) times daily.     irbesartan 150 MG tablet  Commonly known as:  AVAPRO  Take 150 mg by mouth 2 (two) times daily.     meloxicam 7.5 MG tablet  Commonly known as:  MOBIC  Take 7.5 mg by mouth daily.     naproxen sodium 220 MG tablet  Commonly known as:  ANAPROX  Take 220 mg by mouth 2 (two) times daily as needed (for pain).     pantoprazole 40 MG tablet  Commonly known as:  PROTONIX  Take 40 mg by mouth daily.     sitaGLIPtin-metformin 50-500 MG tablet  Commonly known as:  JANUMET  Take 1 tablet by mouth 2 (two) times daily with a meal.     THERAFLU SEVERE COLD/CGH NIGHT 25-10-650 MG Pack  Generic drug:  Diphenhydramine-PE-APAP  Take 1 packet by mouth every 4 (four) hours as needed (for cough/cold).     VICKS DAYQUIL COLD & FLU 10-5-325 MG Caps  Generic drug:  DM-Phenylephrine-Acetaminophen  Take 2 capsules by mouth every 6 (six) hours as needed (for cough).           Follow-up Information    Follow up with Wonda Cerise, MD. Schedule an appointment as soon as possible for a visit in 1 week.   Specialty:  Family Medicine   Why:  Follow up appt after recent hospitalization   Contact information:   [id number] [place] [address] [phone]        The results of significant diagnostics from this hospitalization (including imaging, microbiology, ancillary and laboratory) are listed below for reference.    Significant Diagnostic Studies: Dg Chest 2 View  08/02/2015  CLINICAL DATA:  Cough, weakness, abdominal pain, nausea, vomiting, and diarrhea for 3 days. EXAM: CHEST  2 VIEW COMPARISON:  07/18/2007  FINDINGS: Borderline heart size with normal pulmonary vascularity. Slight fibrosis in the lung bases. No focal consolidation or airspace disease. No blunting of costophrenic angles. No pneumothorax. Tortuous aorta. Degenerative changes in the spine and shoulders. Surgical clips in the right upper quadrant. 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 No evidence of active pulmonary disease. Electronically Signed  By: Lucienne Capers M.D.   On: 08/02/2015 02:09   Dg Abd 1 View  08/02/2015  CLINICAL DATA:  Nausea, vomiting, and diarrhea.  Weakness. EXAM: ABDOMEN - 1 VIEW COMPARISON:  02/22/2012 FINDINGS: Gas-filled small and large bowel throughout. No small or large bowel distention. Changes likely to indicate ileus. Can't exclude enteritis. Surgical clips in the right upper quadrant. No radiopaque stones. Vascular calcifications. Degenerative changes in the spine and hips. IMPRESSION: Diffuse gas-filled small and large bowel without distention suggesting ileus. Electronically Signed   By: Lucienne Capers M.D.   On: 08/02/2015 04:43   Ct Abdomen Pelvis W Contrast  08/02/2015  CLINICAL DATA:  Nausea/vomiting/ diarrhea x3 days, prior cholecystectomy EXAM: CT ABDOMEN AND PELVIS WITH CONTRAST TECHNIQUE: Multidetector CT imaging of the abdomen and pelvis was performed using the standard protocol following bolus administration of intravenous contrast. CONTRAST:  179mL OMNIPAQUE IOHEXOL 300 MG/ML  SOLN COMPARISON:  CT abdomen pelvis dated 07/18/2007 FINDINGS: Lower chest:  Lung bases are clear. Mild cardiomegaly. Hepatobiliary: Liver is within normal limits. No suspicious/enhancing hepatic lesions. Status post cholecystectomy. Mild central intrahepatic ductal prominence. Common duct measures 11 mm and smoothly tapers at the ampulla. Pancreas: Within normal limits. Spleen: Within normal limits. Adrenals/Urinary Tract: Adrenal glands are within normal limits. Kidneys are within normal limits.  No hydronephrosis. Bladder is  within normal limits. Stomach/Bowel: Stomach is within normal limits. No evidence of bowel obstruction. Normal appendix (series 2/ image 48). 2.8 cm low-density/ fatty lesion along the ileocecal valve (series 2/ image 61). Vascular/Lymphatic: Atherosclerotic calcifications of the abdominal aorta and branch vessels. No evidence of abdominal aortic aneurysm. No suspicious abdominopelvic lymphadenopathy. 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 Bilateral ovaries are within normal limits. Other: No abdominopelvic ascites. Tiny fat containing left inguinal hernia (series 2/image 70). Musculoskeletal: Degenerative changes of the visualized thoracolumbar spine. IMPRESSION: No evidence of bowel obstruction.  Normal appendix. Status post cholecystectomy. Common duct measures 11 mm and smoothly tapers at the ampulla, likely postsurgical. Benign-appearing 2.8 cm fatty lesion/lipoma along the ileocecal valve. 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 No CT findings to account for the patient's abdominal pain. Electronically Signed   By: Julian Hy M.D.   On: 08/02/2015 10:36    Microbiology: Recent Results (from the past 240 hour(s))  C difficile quick scan w PCR reflex     Status: None   Collection Time: 08/02/15  5:50 AM  Result Value Ref Range Status   C Diff antigen NEGATIVE NEGATIVE Final   C Diff toxin NEGATIVE NEGATIVE Final   C Diff interpretation Negative for toxigenic C. difficile  Final  Gastrointestinal Panel by PCR , Stool     Status: None   Collection Time: 08/02/15  5:50 AM  Result Value Ref Range Status   Campylobacter species NOT DETECTED NOT DETECTED Final   Plesimonas shigelloides NOT DETECTED NOT DETECTED Final   Salmonella species NOT DETECTED NOT DETECTED Final    Yersinia enterocolitica NOT DETECTED NOT DETECTED Final   Vibrio species NOT DETECTED NOT DETECTED Final   Vibrio cholerae NOT DETECTED NOT DETECTED Final   Enteroaggregative E coli (EAEC) NOT DETECTED NOT DETECTED Final   Enteropathogenic E coli (EPEC) NOT DETECTED NOT DETECTED Final   Enterotoxigenic E coli (ETEC) NOT DETECTED NOT DETECTED Final   Shiga like toxin producing  E coli (STEC) NOT DETECTED NOT DETECTED Final   E. coli O157 NOT DETECTED NOT DETECTED Final   Shigella/Enteroinvasive E coli (EIEC) NOT DETECTED NOT DETECTED Final   Cryptosporidium NOT DETECTED NOT DETECTED Final   Cyclospora cayetanensis NOT DETECTED NOT DETECTED Final   Entamoeba histolytica NOT DETECTED NOT DETECTED Final   Giardia lamblia NOT DETECTED NOT DETECTED Final   Adenovirus F40/41 NOT DETECTED NOT DETECTED Final   Astrovirus NOT DETECTED NOT DETECTED Final   Norovirus GI/GII NOT DETECTED NOT DETECTED Final   Rotavirus A NOT DETECTED NOT DETECTED Final   Sapovirus (I, II, IV, and V) NOT DETECTED NOT DETECTED Final     Labs: Basic Metabolic Panel:  Recent Labs Lab 08/02/15 0045 08/02/15 0706 08/02/15 1210  NA 123* 126* 126*  K 3.9 3.9 4.3  CL 90* 94* 95*  CO2 23 23 23   GLUCOSE 169* 118* 132*  BUN <5* <5* <5*  CREATININE 0.69 0.57 0.59  CALCIUM 9.2 8.6* 8.9   Liver Function Tests:  Recent Labs Lab 08/02/15 0045 08/02/15 0706  AST 25 19  ALT 26 25  ALKPHOS 148* 134*  BILITOT 0.9 0.4  PROT 7.7 6.8  ALBUMIN 4.1 3.6    Recent Labs Lab 08/02/15 0045  LIPASE 29   No results for input(s): AMMONIA in the last 168 hours. CBC:  Recent Labs Lab 08/02/15 0045 08/02/15 0706  WBC 14.4* 8.4  NEUTROABS  --  5.6  HGB 11.9* 10.5*  HCT 36.7 33.0*  MCV 70.3* 72.2*  PLT 477* 437*   Cardiac Enzymes: No results for input(s): CKTOTAL, CKMB, CKMBINDEX, TROPONINI in the last 168 hours. BNP: BNP (last 3 results)  Recent Labs  08/02/15 0706  BNP 90.5    ProBNP (last 3 results) No  results for input(s): PROBNP in the last 8760 hours.  CBG:  Recent Labs Lab 08/02/15 1623 08/02/15 2110 08/03/15 0739  GLUCAP 100* 101* 128*

## 2016-05-04 ENCOUNTER — Encounter (HOSPITAL_COMMUNITY): Payer: Self-pay | Admitting: Emergency Medicine

## 2016-05-04 ENCOUNTER — Emergency Department (HOSPITAL_COMMUNITY)
Admission: EM | Admit: 2016-05-04 | Discharge: 2016-05-04 | Disposition: A | Discharge status: Home / Self Care | Attending: Emergency Medicine | Admitting: Emergency Medicine

## 2016-05-04 DIAGNOSIS — Z79899 Other long term (current) drug therapy: Secondary | ICD-10-CM | POA: Diagnosis not present

## 2016-05-04 DIAGNOSIS — Y92007 Garden or yard of unspecified non-institutional (private) residence as the place of occurrence of the external cause: Secondary | ICD-10-CM | POA: Insufficient documentation

## 2016-05-04 DIAGNOSIS — F17210 Nicotine dependence, cigarettes, uncomplicated: Secondary | ICD-10-CM | POA: Insufficient documentation

## 2016-05-04 DIAGNOSIS — J449 Chronic obstructive pulmonary disease, unspecified: Secondary | ICD-10-CM | POA: Insufficient documentation

## 2016-05-04 DIAGNOSIS — Z23 Encounter for immunization: Secondary | ICD-10-CM | POA: Insufficient documentation

## 2016-05-04 DIAGNOSIS — Y939 Activity, unspecified: Secondary | ICD-10-CM | POA: Insufficient documentation

## 2016-05-04 DIAGNOSIS — Y999 Unspecified external cause status: Secondary | ICD-10-CM | POA: Diagnosis not present

## 2016-05-04 DIAGNOSIS — W228XXA Striking against or struck by other objects, initial encounter: Secondary | ICD-10-CM | POA: Diagnosis not present

## 2016-05-04 DIAGNOSIS — S61411A Laceration without foreign body of right hand, initial encounter: Secondary | ICD-10-CM | POA: Diagnosis not present

## 2016-05-04 DIAGNOSIS — E119 Type 2 diabetes mellitus without complications: Secondary | ICD-10-CM | POA: Diagnosis not present

## 2016-05-04 DIAGNOSIS — I10 Essential (primary) hypertension: Secondary | ICD-10-CM | POA: Insufficient documentation

## 2016-05-04 HISTORY — DX: Bronchitis, not specified as acute or chronic: J40

## 2016-05-04 HISTORY — DX: Chronic obstructive pulmonary disease, unspecified: J44.9

## 2016-05-04 HISTORY — DX: Type 2 diabetes mellitus without complications: E11.9

## 2016-05-04 MED ORDER — TETANUS-DIPHTH-ACELL PERTUSSIS 5-2.5-18.5 LF-MCG/0.5 IM SUSP
0.5000 mL | Freq: Once | INTRAMUSCULAR | Status: AC
  Administered 2016-05-04: 0.5 mL via INTRAMUSCULAR
  Filled 2016-05-04: qty 0.5

## 2016-05-04 NOTE — ED Provider Notes (Signed)
[facility] DEPT Provider Note   CSN: [id number] Arrival date & time: 05/04/16  2007     History   Chief Complaint Chief Complaint  Patient presents with  . Hand Injury    HPI Rhonda Blackwell is a 65 y.o. female.  HPI   Pt is a 65 y/o female presents to the ER with right hand injury that occurred a few hours ago when she was scratched by a branch while working on her yard. She had a small amount of bleeding, she cleaned the cut with alcohol, and she later came to the ER for evaluation.  She has a V-shaped skin tear on her right hand below her thumb. She had controlled bleeding in the ER. Pain is mild, 3/10, constant and unchanged since onset, described as burning, exacerbated by touch, no alleviating factors. She does not know her last tetanus booster. No other acute or associated complaints at this time.  Past Medical History:  Diagnosis Date  . Bronchitis   . COPD (chronic obstructive pulmonary disease) ([place])   . Diabetes mellitus without complication ([place])   . Hypertension     Patient Active Problem List   Diagnosis Date Noted  . Nausea vomiting and diarrhea 08/02/2015  . Dyslipidemia associated with type 2 diabetes mellitus ([place]) 08/02/2015  . Controlled type 2 diabetes mellitus without complication, without long-term current use of insulin ([place]) 08/02/2015  . Hyponatremia 02/22/2012  . HTN (hypertension) 02/22/2012    Past Surgical History:  Procedure Laterality Date  . CESAREAN SECTION    . CHOLECYSTECTOMY    . FOOT SURGERY    . HERNIA REPAIR      OB History    No data available       Home Medications    Prior to Admission medications   Medication Sig Start Date End Date Taking? Authorizing Provider  albuterol (PROVENTIL HFA;VENTOLIN HFA) 108 (90 BASE) MCG/ACT inhaler Inhale 2 puffs into the lungs every 6 (six) hours as needed for shortness of breath (cough). 05/28/15   Merrily Pew, MD  atorvastatin (LIPITOR) 10 MG tablet Take 10 mg by mouth daily.     Historical Provider, MD  diltiazem (TIAZAC) 360 MG 24 hr capsule Take 360 mg by mouth daily.    Historical Provider, MD  Diphenhydramine-PE-APAP [facility] SEVERE COLD/CGH NIGHT) 25-10-650 MG PACK Take 1 packet by mouth every 4 (four) hours as needed (for cough/cold).    Historical Provider, MD  DM-Phenylephrine-Acetaminophen (VICKS DAYQUIL COLD & FLU) 10-5-325 MG CAPS Take 2 capsules by mouth every 6 (six) hours as needed (for cough).    Historical Provider, MD  famotidine (PEPCID) 40 MG tablet Take 40 mg by mouth at bedtime.    Historical Provider, MD  fluticasone (FLONASE) 50 MCG/ACT nasal spray Place 2 sprays into both nostrils 2 (two) times daily.    Historical Provider, MD  gabapentin (NEURONTIN) 300 MG capsule Take 300 mg by mouth 4 (four) times daily.    Historical Provider, MD  irbesartan (AVAPRO) 150 MG tablet Take 150 mg by mouth 2 (two) times daily.    Historical Provider, MD  meloxicam (MOBIC) 7.5 MG tablet Take 7.5 mg by mouth daily.    Historical Provider, MD  naproxen sodium (ANAPROX) 220 MG tablet Take 220 mg by mouth 2 (two) times daily as needed (for pain).    Historical Provider, MD  pantoprazole (PROTONIX) 40 MG tablet Take 40 mg by mouth daily.    Historical Provider, MD  Phenylephrine-DM-GG (EQ TUSSIN CF COUGH & COLD  PO) Take 10 mLs by mouth every 4 (four) hours as needed (for cough).    Historical Provider, MD  sitaGLIPtan-metformin (JANUMET) 50-500 MG per tablet Take 1 tablet by mouth 2 (two) times daily with a meal.    Historical Provider, MD    Family History Family History  Problem Relation Age of Onset  . Diabetes Mellitus II Sister     Social History Social History  Substance Use Topics  . Smoking status: Current Some Day Smoker    Packs/day: 1.00    Types: Cigarettes  . Smokeless tobacco: Never Used  . Alcohol use No     Allergies   Patient has no known allergies.   Review of Systems Review of Systems 10 Systems reviewed and are negative for acute  change except as noted in the HPI.   Physical Exam Updated Vital Signs BP 146/78 (BP Location: Right Arm)   Pulse 75   Temp 98.2 F (36.8 C) (Oral)   Resp 18   SpO2 91%   Physical Exam  Constitutional: She is oriented to person, place, and time. She appears well-developed and well-nourished. No distress.  HENT:  Head: Normocephalic and atraumatic.  Right Ear: External ear normal.  Left Ear: External ear normal.  Nose: Nose normal.  Mouth/Throat: Oropharynx is clear and moist.  Eyes: Conjunctivae are normal. Pupils are equal, round, and reactive to light. Right eye exhibits no discharge. Left eye exhibits no discharge. No scleral icterus.  Neck: Normal range of motion. No tracheal deviation present.  Cardiovascular: Normal rate and regular rhythm.   Pulmonary/Chest: Effort normal and breath sounds normal. No stridor. No respiratory distress.  Musculoskeletal: Normal range of motion. She exhibits no edema.  Neurological: She is alert and oriented to person, place, and time. She exhibits normal muscle tone. Coordination normal.  Skin: Skin is warm. No rash noted. She is not diaphoretic. No erythema. No pallor.  Superficial skin tear 1.5 x 1.5 cm in V-shape located on dorsum of right hand over 1st MC, no active bleeding, no edema, mildly ttp  Psychiatric: She has a normal mood and affect. Her behavior is normal. Judgment and thought content normal.  Nursing note and vitals reviewed.    ED Treatments / Results  Labs (all labs ordered are listed, but only abnormal results are displayed) Labs Reviewed - No data to display  EKG  EKG Interpretation None       Radiology No results found.  Procedures Procedures (including critical care time)  LACERATION REPAIR Performed by: Delsa Grana Consent: Verbal consent obtained. Risks and benefits: risks, benefits and alternatives were discussed Patient identity confirmed: provided demographic data Time out performed prior to  procedure Prepped and Draped in normal sterile fashion Wound explored Laceration Location: right hand - skin tear Laceration Length: 1.5 x 1.5 cm No Foreign Bodies seen or palpated Anesthesia: none Irrigation method: syringe, cleaned with irrigation and wound cleaner, wound edges reapproximated Amount of cleaning: standard Skin closure: steri-strips and benzoin  Number of sutures or staples: 3 steristrips Technique: steristrips Patient tolerance: Patient tolerated the procedure well with no immediate complications.   Medications Ordered in ED Medications  Tdap (BOOSTRIX) injection 0.5 mL (0.5 mLs Intramuscular Given 05/04/16 2224)     Initial Impression / Assessment and Plan / ED Course  I have reviewed the triage vital signs and the nursing notes.  Pertinent labs & imaging results that were available during my care of the patient were reviewed by me and considered in my medical  decision making (see chart for details).  Clinical Course    Superficial skin tear, tetanus updated, repaired with steristrips, wound care reviewed, to follow up with PCP.  Final Clinical Impressions(s) / ED Diagnoses   Final diagnoses:  Skin tear of right hand without complication, initial encounter    New Prescriptions New Prescriptions   No medications on file     Delsa Grana, Hershal Coria 05/04/16 2239    Julianne Rice, MD 05/12/16 [phone]

## 2016-05-04 NOTE — ED Triage Notes (Signed)
Pt presents with complaints of a break in skin on her right hand at posterior base of thumb.  States she was picking persimmons and the branch scratched her.  Small skin tear noted to that area.

## 2022-07-23 ENCOUNTER — Emergency Department (HOSPITAL_COMMUNITY): Payer: Medicare Other

## 2022-07-23 ENCOUNTER — Inpatient Hospital Stay (HOSPITAL_COMMUNITY)
Admission: EM | Admit: 2022-07-23 | Discharge: 2022-07-28 | DRG: 871 | Disposition: A | Discharge status: Home Health | Payer: Medicare Other | Attending: Internal Medicine | Admitting: Internal Medicine

## 2022-07-23 ENCOUNTER — Encounter (HOSPITAL_COMMUNITY): Payer: Self-pay | Admitting: Emergency Medicine

## 2022-07-23 DIAGNOSIS — K5909 Other constipation: Secondary | ICD-10-CM | POA: Diagnosis present

## 2022-07-23 DIAGNOSIS — J9621 Acute and chronic respiratory failure with hypoxia: Secondary | ICD-10-CM

## 2022-07-23 DIAGNOSIS — F419 Anxiety disorder, unspecified: Secondary | ICD-10-CM | POA: Diagnosis present

## 2022-07-23 DIAGNOSIS — E871 Hypo-osmolality and hyponatremia: Secondary | ICD-10-CM | POA: Diagnosis not present

## 2022-07-23 DIAGNOSIS — E876 Hypokalemia: Secondary | ICD-10-CM | POA: Diagnosis present

## 2022-07-23 DIAGNOSIS — C7951 Secondary malignant neoplasm of bone: Secondary | ICD-10-CM | POA: Diagnosis present

## 2022-07-23 DIAGNOSIS — Z833 Family history of diabetes mellitus: Secondary | ICD-10-CM | POA: Diagnosis not present

## 2022-07-23 DIAGNOSIS — F32A Depression, unspecified: Secondary | ICD-10-CM | POA: Diagnosis present

## 2022-07-23 DIAGNOSIS — R911 Solitary pulmonary nodule: Secondary | ICD-10-CM | POA: Diagnosis present

## 2022-07-23 DIAGNOSIS — J9601 Acute respiratory failure with hypoxia: Secondary | ICD-10-CM

## 2022-07-23 DIAGNOSIS — R6520 Severe sepsis without septic shock: Secondary | ICD-10-CM | POA: Diagnosis present

## 2022-07-23 DIAGNOSIS — J439 Emphysema, unspecified: Secondary | ICD-10-CM | POA: Diagnosis present

## 2022-07-23 DIAGNOSIS — G8929 Other chronic pain: Secondary | ICD-10-CM | POA: Diagnosis present

## 2022-07-23 DIAGNOSIS — E119 Type 2 diabetes mellitus without complications: Secondary | ICD-10-CM | POA: Diagnosis present

## 2022-07-23 DIAGNOSIS — J189 Pneumonia, unspecified organism: Secondary | ICD-10-CM | POA: Diagnosis present

## 2022-07-23 DIAGNOSIS — Z888 Allergy status to other drugs, medicaments and biological substances status: Secondary | ICD-10-CM

## 2022-07-23 DIAGNOSIS — I480 Paroxysmal atrial fibrillation: Secondary | ICD-10-CM | POA: Diagnosis not present

## 2022-07-23 DIAGNOSIS — A419 Sepsis, unspecified organism: Secondary | ICD-10-CM | POA: Diagnosis not present

## 2022-07-23 DIAGNOSIS — I700 Atherosclerosis of aorta: Secondary | ICD-10-CM | POA: Diagnosis present

## 2022-07-23 DIAGNOSIS — Z9049 Acquired absence of other specified parts of digestive tract: Secondary | ICD-10-CM

## 2022-07-23 DIAGNOSIS — I10 Essential (primary) hypertension: Secondary | ICD-10-CM

## 2022-07-23 DIAGNOSIS — Z1152 Encounter for screening for COVID-19: Secondary | ICD-10-CM | POA: Diagnosis not present

## 2022-07-23 DIAGNOSIS — Z9981 Dependence on supplemental oxygen: Secondary | ICD-10-CM | POA: Diagnosis not present

## 2022-07-23 DIAGNOSIS — N200 Calculus of kidney: Secondary | ICD-10-CM | POA: Diagnosis present

## 2022-07-23 DIAGNOSIS — C801 Malignant (primary) neoplasm, unspecified: Secondary | ICD-10-CM | POA: Diagnosis present

## 2022-07-23 DIAGNOSIS — E785 Hyperlipidemia, unspecified: Secondary | ICD-10-CM | POA: Diagnosis present

## 2022-07-23 DIAGNOSIS — J440 Chronic obstructive pulmonary disease with acute lower respiratory infection: Secondary | ICD-10-CM | POA: Diagnosis present

## 2022-07-23 DIAGNOSIS — R5381 Other malaise: Secondary | ICD-10-CM | POA: Diagnosis present

## 2022-07-23 DIAGNOSIS — I493 Ventricular premature depolarization: Secondary | ICD-10-CM | POA: Diagnosis present

## 2022-07-23 DIAGNOSIS — Z8673 Personal history of transient ischemic attack (TIA), and cerebral infarction without residual deficits: Secondary | ICD-10-CM

## 2022-07-23 DIAGNOSIS — Z87898 Personal history of other specified conditions: Secondary | ICD-10-CM

## 2022-07-23 DIAGNOSIS — I4892 Unspecified atrial flutter: Secondary | ICD-10-CM | POA: Diagnosis not present

## 2022-07-23 DIAGNOSIS — Z7982 Long term (current) use of aspirin: Secondary | ICD-10-CM

## 2022-07-23 DIAGNOSIS — R1011 Right upper quadrant pain: Secondary | ICD-10-CM | POA: Diagnosis present

## 2022-07-23 DIAGNOSIS — I4891 Unspecified atrial fibrillation: Secondary | ICD-10-CM

## 2022-07-23 DIAGNOSIS — Z7984 Long term (current) use of oral hypoglycemic drugs: Secondary | ICD-10-CM

## 2022-07-23 DIAGNOSIS — Z79899 Other long term (current) drug therapy: Secondary | ICD-10-CM

## 2022-07-23 LAB — I-STAT CHEM 8, ED
BUN: 10 mg/dL (ref 8–23)
Calcium, Ion: 1.08 mmol/L — ABNORMAL LOW (ref 1.15–1.40)
Chloride: 95 mmol/L — ABNORMAL LOW (ref 98–111)
Creatinine, Ser: 0.9 mg/dL (ref 0.44–1.00)
Glucose, Bld: 102 mg/dL — ABNORMAL HIGH (ref 70–99)
HCT: 39 % (ref 36.0–46.0)
Hemoglobin: 13.3 g/dL (ref 12.0–15.0)
Potassium: 3.4 mmol/L — ABNORMAL LOW (ref 3.5–5.1)
Sodium: 136 mmol/L (ref 135–145)
TCO2: 26 mmol/L (ref 22–32)

## 2022-07-23 LAB — CBC WITH DIFFERENTIAL/PLATELET
Abs Immature Granulocytes: 0.06 10*3/uL (ref 0.00–0.07)
Basophils Absolute: 0.1 10*3/uL (ref 0.0–0.1)
Basophils Relative: 1 %
Eosinophils Absolute: 0.1 10*3/uL (ref 0.0–0.5)
Eosinophils Relative: 0 %
HCT: 38 % (ref 36.0–46.0)
Hemoglobin: 12.2 g/dL (ref 12.0–15.0)
Immature Granulocytes: 1 %
Lymphocytes Relative: 17 %
Lymphs Abs: 2.2 10*3/uL (ref 0.7–4.0)
MCH: 25.7 pg — ABNORMAL LOW (ref 26.0–34.0)
MCHC: 32.1 g/dL (ref 30.0–36.0)
MCV: 80 fL (ref 80.0–100.0)
Monocytes Absolute: 1 10*3/uL (ref 0.1–1.0)
Monocytes Relative: 8 %
Neutro Abs: 9.7 10*3/uL — ABNORMAL HIGH (ref 1.7–7.7)
Neutrophils Relative %: 73 %
Platelets: 426 10*3/uL — ABNORMAL HIGH (ref 150–400)
RBC: 4.75 MIL/uL (ref 3.87–5.11)
RDW: 14.5 % (ref 11.5–15.5)
WBC: 13 10*3/uL — ABNORMAL HIGH (ref 4.0–10.5)
nRBC: 0 % (ref 0.0–0.2)

## 2022-07-23 LAB — COMPREHENSIVE METABOLIC PANEL
ALT: 15 U/L (ref 0–44)
AST: 20 U/L (ref 15–41)
Albumin: 3.5 g/dL (ref 3.5–5.0)
Alkaline Phosphatase: 62 U/L (ref 38–126)
Anion gap: 13 (ref 5–15)
BUN: 10 mg/dL (ref 8–23)
CO2: 25 mmol/L (ref 22–32)
Calcium: 8.8 mg/dL — ABNORMAL LOW (ref 8.9–10.3)
Chloride: 95 mmol/L — ABNORMAL LOW (ref 98–111)
Creatinine, Ser: 0.9 mg/dL (ref 0.44–1.00)
GFR, Estimated: >60 mL/min (ref >60)
Glucose, Bld: 103 mg/dL — ABNORMAL HIGH (ref 70–99)
Potassium: 3.4 mmol/L — ABNORMAL LOW (ref 3.5–5.1)
Sodium: 133 mmol/L — ABNORMAL LOW (ref 135–145)
Total Bilirubin: 0.7 mg/dL (ref 0.3–1.2)
Total Protein: 6.8 g/dL (ref 6.5–8.1)

## 2022-07-23 LAB — TROPONIN I (HIGH SENSITIVITY)
Troponin I (High Sensitivity): 8 ng/L (ref <18)
Troponin I (High Sensitivity): 7 ng/L (ref <18)

## 2022-07-23 LAB — LIPASE, BLOOD: Lipase: 38 U/L (ref 11–51)

## 2022-07-23 LAB — RESP PANEL BY RT-PCR (RSV, FLU A&B, COVID)  RVPGX2
Influenza A by PCR: NEGATIVE
Influenza B by PCR: NEGATIVE
Resp Syncytial Virus by PCR: NEGATIVE
SARS Coronavirus 2 by RT PCR: NEGATIVE

## 2022-07-23 LAB — URINALYSIS, ROUTINE W REFLEX MICROSCOPIC
Bilirubin Urine: NEGATIVE
Glucose, UA: NEGATIVE mg/dL
Hgb urine dipstick: NEGATIVE
Ketones, ur: NEGATIVE mg/dL
Nitrite: NEGATIVE
Protein, ur: NEGATIVE mg/dL
Specific Gravity, Urine: 1.015 (ref 1.005–1.030)
pH: 6 (ref 5.0–8.0)

## 2022-07-23 LAB — LACTIC ACID, PLASMA: Lactic Acid, Venous: 0.9 mmol/L (ref 0.5–1.9)

## 2022-07-23 MED ORDER — ONDANSETRON HCL 4 MG/2ML IJ SOLN
4.0000 mg | Freq: Once | INTRAMUSCULAR | Status: AC
  Administered 2022-07-23: 4 mg via INTRAVENOUS
  Filled 2022-07-23: qty 2

## 2022-07-23 MED ORDER — IOHEXOL 350 MG/ML SOLN
75.0000 mL | Freq: Once | INTRAVENOUS | Status: AC | PRN
  Administered 2022-07-23: 75 mL via INTRAVENOUS

## 2022-07-23 MED ORDER — LACTATED RINGERS IV BOLUS (SEPSIS): 500.0000 mL | Freq: Once | INTRAVENOUS | Status: DC

## 2022-07-23 MED ORDER — LACTATED RINGERS IV BOLUS (SEPSIS)
1000.0000 mL | Freq: Once | INTRAVENOUS | Status: AC
  Administered 2022-07-24: 1000 mL via INTRAVENOUS

## 2022-07-23 MED ORDER — SODIUM CHLORIDE 0.9 % IV SOLN
500.0000 mg | Freq: Once | INTRAVENOUS | Status: AC
  Administered 2022-07-24: 500 mg via INTRAVENOUS
  Filled 2022-07-23: qty 5

## 2022-07-23 MED ORDER — SODIUM CHLORIDE 0.9 % IV SOLN
1.0000 g | Freq: Once | INTRAVENOUS | Status: AC
  Administered 2022-07-23: 1 g via INTRAVENOUS
  Filled 2022-07-23: qty 10

## 2022-07-23 MED ORDER — LACTATED RINGERS IV SOLN: INTRAVENOUS | Status: DC

## 2022-07-23 MED ORDER — MORPHINE SULFATE (PF) 4 MG/ML IV SOLN
4.0000 mg | Freq: Once | INTRAVENOUS | Status: AC
  Administered 2022-07-23: 4 mg via INTRAVENOUS
  Filled 2022-07-23: qty 1

## 2022-07-23 NOTE — ED Triage Notes (Signed)
Pt endorses all over back pain for 3 days. Pt had injection 3 months ago. Pain is chronic. Pt also reports recent cancer diagnosis from [facility].

## 2022-07-23 NOTE — ED Provider Notes (Addendum)
[facility] Provider Note   CSN: [id number] Arrival date & time: 07/23/22  5462     History  Chief Complaint  Patient presents with   Back Pain    Rhonda Blackwell is a 72 y.o. female.  72 year old female with past medical history significant for chronic low back pain, recent cancer diagnosis not undergoing treatment yet presents today for evaluation of 3-day duration of low back pain, abdominal pain, chest wall pain.  She also has a cough however states it is no worse than usual however it is productive which is new.  She denies fever, or shortness of breath.  She denies nausea, vomiting, dysuria, flank pain, hematuria.  Denies any recent injury to her back.  The history is provided by the patient. No language interpreter was used.       Home Medications Prior to Admission medications   Medication Sig Start Date End Date Taking? Authorizing Provider  albuterol (PROVENTIL HFA;VENTOLIN HFA) 108 (90 BASE) MCG/ACT inhaler Inhale 2 puffs into the lungs every 6 (six) hours as needed for shortness of breath (cough). 05/28/15   Mesner, Corene Cornea, MD  atorvastatin (LIPITOR) 10 MG tablet Take 10 mg by mouth daily.    [provider]  diltiazem (TIAZAC) 360 MG 24 hr capsule Take 360 mg by mouth daily.    [provider]  Diphenhydramine-PE-APAP (THERAFLU SEVERE COLD/CGH NIGHT) 25-10-650 MG PACK Take 1 packet by mouth every 4 (four) hours as needed (for cough/cold).    [provider]  DM-Phenylephrine-Acetaminophen (VICKS DAYQUIL COLD & FLU) 10-5-325 MG CAPS Take 2 capsules by mouth every 6 (six) hours as needed (for cough).    [provider]  famotidine (PEPCID) 40 MG tablet Take 40 mg by mouth at bedtime.    [provider]  fluticasone (FLONASE) 50 MCG/ACT nasal spray Place 2 sprays into both nostrils 2 (two) times daily.    [provider]  gabapentin (NEURONTIN) 300 MG capsule Take 300 mg by mouth 4  (four) times daily.    [provider]  irbesartan (AVAPRO) 150 MG tablet Take 150 mg by mouth 2 (two) times daily.    [provider]  meloxicam (MOBIC) 7.5 MG tablet Take 7.5 mg by mouth daily.    [provider]  naproxen sodium (ANAPROX) 220 MG tablet Take 220 mg by mouth 2 (two) times daily as needed (for pain).    [provider]  pantoprazole (PROTONIX) 40 MG tablet Take 40 mg by mouth daily.    [provider]  Phenylephrine-DM-GG (EQ TUSSIN CF COUGH & COLD PO) Take 10 mLs by mouth every 4 (four) hours as needed (for cough).    [provider]  sitaGLIPtan-metformin (JANUMET) 50-500 MG per tablet Take 1 tablet by mouth 2 (two) times daily with a meal.    [provider]      Allergies    Hydrochlorothiazide    Review of Systems   Review of Systems  Constitutional:  Negative for chills and fever.  Respiratory:  Positive for cough. Negative for shortness of breath.   Cardiovascular:  Positive for chest pain. Negative for palpitations and leg swelling.  Gastrointestinal:  Positive for abdominal pain. Negative for diarrhea, nausea and vomiting.  Musculoskeletal:  Positive for back pain. Negative for arthralgias, neck pain and neck stiffness.  Neurological:  Negative for weakness and light-headedness.  All other systems reviewed and are negative.   Physical Exam Updated Vital Signs BP Marland Kitchen)  141/70   Pulse 78   Temp 98.3 F (36.8 C)   Resp 17   Ht '5\' 3"'$  (1.6 m)   Wt 77 kg   SpO2 94%   BMI 30.07 kg/m  Physical Exam Vitals and nursing note reviewed.  Constitutional:      General: She is not in acute distress.    Appearance: Normal appearance. She is not ill-appearing.     Comments: Uncomfortable appearing but without acute distress  HENT:     Head: Normocephalic and atraumatic.     Nose: Nose normal.  Eyes:     General: No scleral icterus.    Extraocular Movements: Extraocular movements intact.      Conjunctiva/sclera: Conjunctivae normal.  Cardiovascular:     Rate and Rhythm: Normal rate and regular rhythm.     Pulses: Normal pulses.  Pulmonary:     Effort: Pulmonary effort is normal. No respiratory distress.     Breath sounds: Normal breath sounds. No wheezing or rales.  Abdominal:     General: There is distension.     Tenderness: There is abdominal tenderness. There is no right CVA tenderness, left CVA tenderness, guarding or rebound.  Musculoskeletal:        General: Normal range of motion.     Cervical back: Normal range of motion.     Comments: Cervical, thoracic spine without tenderness palpation.  Lumbar with mild tenderness palpation.  Full range of motion of bilateral upper and lower extremities with 5/5 strength in extensor muscle groups.  Skin:    General: Skin is warm and dry.  Neurological:     General: No focal deficit present.     Mental Status: She is alert and oriented to person, place, and time. Mental status is at baseline.     ED Results / Procedures / Treatments   Labs (all labs ordered are listed, but only abnormal results are displayed) Labs Reviewed  CBC WITH DIFFERENTIAL/PLATELET - Abnormal; Notable for the following components:      Result Value   WBC 13.0 (*)    MCH 25.7 (*)    Platelets 426 (*)    Neutro Abs 9.7 (*)    All other components within normal limits  COMPREHENSIVE METABOLIC PANEL - Abnormal; Notable for the following components:   Sodium 133 (*)    Potassium 3.4 (*)    Chloride 95 (*)    Glucose, Bld 103 (*)    Calcium 8.8 (*)    All other components within normal limits  I-STAT CHEM 8, ED - Abnormal; Notable for the following components:   Potassium 3.4 (*)    Chloride 95 (*)    Glucose, Bld 102 (*)    Calcium, Ion 1.08 (*)    All other components within normal limits  RESP PANEL BY RT-PCR (RSV, FLU A&B, COVID)  RVPGX2  LIPASE, BLOOD  URINALYSIS, ROUTINE W REFLEX MICROSCOPIC  TROPONIN I (HIGH SENSITIVITY)  TROPONIN I  (HIGH SENSITIVITY)    EKG None  Radiology DG Chest Portable 1 View  Result Date: 07/23/2022 CLINICAL DATA:  Chest pain. EXAM: PORTABLE CHEST 1 VIEW COMPARISON:  08/02/2015. FINDINGS: The heart size and mediastinal contours are within normal limits. There is atherosclerotic calcification of the aorta. Focal airspace disease is noted in the left upper lobe. Interstitial prominence is present at the lung bases bilaterally. No effusion or pneumothorax. No acute osseous abnormality. IMPRESSION: Focal airspace disease in the left upper lobe with interstitial prominence at the lung bases bilaterally, suspicious  for pneumonia. Electronically Signed   By: Brett Fairy M.D.   On: 07/23/2022 20:20    Procedures .Critical Care  Performed by: Evlyn Courier, PA-C Authorized by: Evlyn Courier, PA-C   Critical care provider statement:    Critical care time (minutes):  35   Critical care was necessary to treat or prevent imminent or life-threatening deterioration of the following conditions:  Sepsis   Critical care was time spent personally by me on the following activities:  Development of treatment plan with patient or surrogate, discussions with consultants, evaluation of patient's response to treatment, examination of patient, ordering and review of laboratory studies, ordering and review of radiographic studies, ordering and performing treatments and interventions, pulse oximetry, re-evaluation of patient's condition and review of old charts     Medications Ordered in ED Medications  ondansetron (ZOFRAN) injection 4 mg (4 mg Intravenous Given 07/23/22 2023)  morphine (PF) 4 MG/ML injection 4 mg (4 mg Intravenous Given 07/23/22 2024)    ED Course/ Medical Decision Making/ A&P                             Medical Decision Making Amount and/or Complexity of Data Reviewed Labs: ordered. Radiology: ordered.  Risk Prescription drug management. Decision regarding hospitalization.   Medical Decision  Making / ED Course   This patient presents to the ED for concern of low back pain, abdominal pain, chest pain, this involves an extensive number of treatment options, and is a complaint that carries with it a high risk of complications and morbidity.  The differential diagnosis includes ACS, PE, pneumonia, lumbar fracture, gastroenteritis, pancreatitis, appendicitis, diverticulitis  MDM: 72 year old female presents today for evaluation of 3-day duration of worsening low back pain, abdominal pain, chest wall pain.  Overall she is uncomfortable appearing but without acute distress.  Does have lumbar process tenderness but no step-offs.  Has full range of motion of bilateral upper and lower extremities.  Endorses cough but no worsening other than recent productive cough.  Afebrile.  Will evaluate with blood work, respiratory panel, CT abdomen pelvis with contrast, CT L-spine, chest x-ray.  Will provide pain control. Chest x-ray with evidence of pneumonia.  On review patient had heart rate of 95, leukocytosis of 13,000, and hypoxia with source of infection meeting SIRS criteria.  Will provide community-acquired pneumonia coverage.  Large volume resuscitation ordered following discussion with hospitalist.  No evidence of shock.  Patient placed on 2 L supplemental O2.  Not on baseline O2.  Respiratory panel negative.  CBC shows leukocytosis, without anemia.  CMP shows no acute findings.  Preserved renal function.  UA without evidence of UTI.  11-20 WBCs and small leukocytes.  In setting of no UTI symptoms low suspicion for UTI.  CT abdomen/pelvis without acute process.  CT L-spine shows canal stenosis but no fracture.  Good strength and without red flag signs or symptoms.  Low suspicion for cauda equina syndrome.  She denies bowel or bladder incontinence, saddle anesthesia, inability to walk, fever.  Discussed with hospitalist will evaluate for admission.   Lab Tests: -I ordered, reviewed, and interpreted  labs.   The pertinent results include:   Labs Reviewed  CBC WITH DIFFERENTIAL/PLATELET - Abnormal; Notable for the following components:      Result Value   WBC 13.0 (*)    MCH 25.7 (*)    Platelets 426 (*)    Neutro Abs 9.7 (*)    All other  components within normal limits  COMPREHENSIVE METABOLIC PANEL - Abnormal; Notable for the following components:   Sodium 133 (*)    Potassium 3.4 (*)    Chloride 95 (*)    Glucose, Bld 103 (*)    Calcium 8.8 (*)    All other components within normal limits  URINALYSIS, ROUTINE W REFLEX MICROSCOPIC - Abnormal; Notable for the following components:   APPearance HAZY (*)    Leukocytes,Ua SMALL (*)    Bacteria, UA FEW (*)    All other components within normal limits  I-STAT CHEM 8, ED - Abnormal; Notable for the following components:   Potassium 3.4 (*)    Chloride 95 (*)    Glucose, Bld 102 (*)    Calcium, Ion 1.08 (*)    All other components within normal limits  RESP PANEL BY RT-PCR (RSV, FLU A&B, COVID)  RVPGX2  CULTURE, BLOOD (ROUTINE X 2)  CULTURE, BLOOD (ROUTINE X 2)  LIPASE, BLOOD  LACTIC ACID, PLASMA  LACTIC ACID, PLASMA  PROTIME-INR  APTT  TROPONIN I (HIGH SENSITIVITY)  TROPONIN I (HIGH SENSITIVITY)      EKG  EKG Interpretation  Date/Time:    Ventricular Rate:    PR Interval:    QRS Duration:   QT Interval:    QTC Calculation:   R Axis:     Text Interpretation:           Imaging Studies ordered: I ordered imaging studies including chest x-ray, CT abdomen pelvis with contrast, CT L-spine wo contrast I independently visualized and interpreted imaging. I agree with the radiologist interpretation   Medicines ordered and prescription drug management: Meds ordered this encounter  Medications   ondansetron (ZOFRAN) injection 4 mg   morphine (PF) 4 MG/ML injection 4 mg   cefTRIAXone (ROCEPHIN) 1 g in sodium chloride 0.9 % 100 mL IVPB    Order Specific Question:   Antibiotic Indication:    Answer:   CAP    azithromycin (ZITHROMAX) 500 mg in sodium chloride 0.9 % 250 mL IVPB   iohexol (OMNIPAQUE) 350 MG/ML injection 75 mL   lactated ringers infusion   AND Linked Order Group    lactated ringers bolus 1,000 mL     Order Specific Question:   Total Body Weight basis for 30 mL/kg  bolus delivery     Answer:   77 kg    lactated ringers bolus 1,000 mL     Order Specific Question:   Total Body Weight basis for 30 mL/kg  bolus delivery     Answer:   77 kg    lactated ringers bolus 500 mL     Order Specific Question:   Total Body Weight basis for 30 mL/kg  bolus delivery     Answer:   77 kg    -I have reviewed the patients home medicines and have made adjustments as needed  Critical interventions IV antibiotics, large volume resuscitation   Cardiac Monitoring: The patient was maintained on a cardiac monitor.  I personally viewed and interpreted the cardiac monitored which showed an underlying rhythm of: sinus tachycardia  Reevaluation: After the interventions noted above, I reevaluated the patient and found that they have :improved  Co morbidities that complicate the patient evaluation  Past Medical History:  Diagnosis Date   Bronchitis    COPD (chronic obstructive pulmonary disease) ([place])    Diabetes mellitus without complication ([place])    Hypertension       Dispostion: Patient discussed with hospitalist who will  evaluate patient for admission.  Final Clinical Impression(s) / ED Diagnoses Final diagnoses:  Sepsis, due to unspecified organism, unspecified whether acute organ dysfunction present [facility])  Pneumonia of left lung due to infectious organism, unspecified part of lung    Rx / DC Orders ED Discharge Orders     None         Evlyn Courier, PA-C 07/24/22 0013    Evlyn Courier, PA-C 07/24/22 0014    Elgie Congo, MD 07/24/22 1125

## 2022-07-23 NOTE — ED Provider Notes (Incomplete)
[facility] Provider Note   CSN: [id number] Arrival date & time: 07/23/22  2595     History {Add pertinent medical, surgical, social history, OB history to HPI:1} Chief Complaint  Patient presents with  . Back Pain    Rhonda Blackwell is a 72 y.o. female.  72 year old female with past medical history significant for chronic low back pain, recent cancer diagnosis not undergoing treatment yet presents today for evaluation of 3-day duration of low back pain, abdominal pain, chest wall pain.  She also has a cough however states it is no worse than usual however it is productive which is new.  She denies fever, or shortness of breath.  She denies nausea, vomiting, dysuria, flank pain, hematuria.  Denies any recent injury to her back.  The history is provided by the patient. No language interpreter was used.       Home Medications Prior to Admission medications   Medication Sig Start Date End Date Taking? Authorizing Provider  albuterol (PROVENTIL HFA;VENTOLIN HFA) 108 (90 BASE) MCG/ACT inhaler Inhale 2 puffs into the lungs every 6 (six) hours as needed for shortness of breath (cough). 05/28/15   Mesner, Corene Cornea, MD  atorvastatin (LIPITOR) 10 MG tablet Take 10 mg by mouth daily.    [provider]  diltiazem (TIAZAC) 360 MG 24 hr capsule Take 360 mg by mouth daily.    [provider]  Diphenhydramine-PE-APAP (THERAFLU SEVERE COLD/CGH NIGHT) 25-10-650 MG PACK Take 1 packet by mouth every 4 (four) hours as needed (for cough/cold).    [provider]  DM-Phenylephrine-Acetaminophen (VICKS DAYQUIL COLD & FLU) 10-5-325 MG CAPS Take 2 capsules by mouth every 6 (six) hours as needed (for cough).    [provider]  famotidine (PEPCID) 40 MG tablet Take 40 mg by mouth at bedtime.    [provider]  fluticasone (FLONASE) 50 MCG/ACT nasal spray Place 2 sprays into both nostrils 2 (two) times daily.    [provider]  gabapentin (NEURONTIN) 300 MG capsule Take 300 mg by mouth 4 (four) times daily.    [provider]  irbesartan (AVAPRO) 150 MG tablet Take 150 mg by mouth 2 (two) times daily.    [provider]  meloxicam (MOBIC) 7.5 MG tablet Take 7.5 mg by mouth daily.    [provider]  naproxen sodium (ANAPROX) 220 MG tablet Take 220 mg by mouth 2 (two) times daily as needed (for pain).    [provider]  pantoprazole (PROTONIX) 40 MG tablet Take 40 mg by mouth daily.    [provider]  Phenylephrine-DM-GG (EQ TUSSIN CF COUGH & COLD PO) Take 10 mLs by mouth every 4 (four) hours as needed (for cough).    [provider]  sitaGLIPtan-metformin (JANUMET) 50-500 MG per tablet Take 1 tablet by mouth 2 (two) times daily with a meal.    [provider]      Allergies    Hydrochlorothiazide    Review of Systems   Review of Systems  Constitutional:  Negative for chills and fever.  Respiratory:  Positive for cough. Negative for shortness of breath.   Cardiovascular:  Positive for chest pain. Negative for palpitations and leg swelling.  Gastrointestinal:  Positive for abdominal pain. Negative for diarrhea, nausea and vomiting.  Musculoskeletal:  Positive for back pain. Negative for arthralgias, neck pain and neck stiffness.  Neurological:  Negative for weakness and light-headedness.  All other systems reviewed and are negative.  Physical Exam Updated Vital Signs BP (!) 141/70   Pulse 78   Temp 98.3 F (36.8 C)   Resp 17   Ht '5\' 3"'$  (1.6 m)   Wt 77 kg   SpO2 94%   BMI 30.07 kg/m  Physical Exam Vitals and nursing note reviewed.  Constitutional:      General: She is not in acute distress.    Appearance: Normal appearance. She is not ill-appearing.     Comments: Uncomfortable appearing but without acute distress  HENT:     Head: Normocephalic and atraumatic.     Nose: Nose normal.  Eyes:     General: No scleral  icterus.    Extraocular Movements: Extraocular movements intact.     Conjunctiva/sclera: Conjunctivae normal.  Cardiovascular:     Rate and Rhythm: Normal rate and regular rhythm.     Pulses: Normal pulses.  Pulmonary:     Effort: Pulmonary effort is normal. No respiratory distress.     Breath sounds: Normal breath sounds. No wheezing or rales.  Abdominal:     General: There is distension.     Tenderness: There is abdominal tenderness. There is no right CVA tenderness, left CVA tenderness, guarding or rebound.  Musculoskeletal:        General: Normal range of motion.     Cervical back: Normal range of motion.     Comments: Cervical, thoracic spine without tenderness palpation.  Lumbar with mild tenderness palpation.  Full range of motion of bilateral upper and lower extremities with 5/5 strength in extensor muscle groups.  Skin:    General: Skin is warm and dry.  Neurological:     General: No focal deficit present.     Mental Status: She is alert and oriented to person, place, and time. Mental status is at baseline.     ED Results / Procedures / Treatments   Labs (all labs ordered are listed, but only abnormal results are displayed) Labs Reviewed  CBC WITH DIFFERENTIAL/PLATELET - Abnormal; Notable for the following components:      Result Value   WBC 13.0 (*)    MCH 25.7 (*)    Platelets 426 (*)    Neutro Abs 9.7 (*)    All other components within normal limits  COMPREHENSIVE METABOLIC PANEL - Abnormal; Notable for the following components:   Sodium 133 (*)    Potassium 3.4 (*)    Chloride 95 (*)    Glucose, Bld 103 (*)    Calcium 8.8 (*)    All other components within normal limits  I-STAT CHEM 8, ED - Abnormal; Notable for the following components:   Potassium 3.4 (*)    Chloride 95 (*)    Glucose, Bld 102 (*)    Calcium, Ion 1.08 (*)    All other components within normal limits  RESP PANEL BY RT-PCR (RSV, FLU A&B, COVID)  RVPGX2  LIPASE, BLOOD  URINALYSIS, ROUTINE  W REFLEX MICROSCOPIC  TROPONIN I (HIGH SENSITIVITY)  TROPONIN I (HIGH SENSITIVITY)    EKG None  Radiology DG Chest Portable 1 View  Result Date: 07/23/2022 CLINICAL DATA:  Chest pain. EXAM: PORTABLE CHEST 1 VIEW COMPARISON:  08/02/2015. FINDINGS: The heart size and mediastinal contours are within normal limits. There is atherosclerotic calcification of the aorta. Focal airspace disease is noted in the left upper lobe. Interstitial prominence is present at the lung bases bilaterally. No effusion or pneumothorax. No acute osseous abnormality. IMPRESSION: Focal airspace disease in the left upper lobe with interstitial  prominence at the lung bases bilaterally, suspicious for pneumonia. Electronically Signed   By: Brett Fairy M.D.   On: 07/23/2022 20:20    Procedures Procedures  {Document cardiac monitor, telemetry assessment procedure when appropriate:1}  Medications Ordered in ED Medications  ondansetron (ZOFRAN) injection 4 mg (4 mg Intravenous Given 07/23/22 2023)  morphine (PF) 4 MG/ML injection 4 mg (4 mg Intravenous Given 07/23/22 2024)    ED Course/ Medical Decision Making/ A&P   {   Click here for ABCD2, HEART and other calculatorsREFRESH Note before signing :1}                          Medical Decision Making Amount and/or Complexity of Data Reviewed Labs: ordered. Radiology: ordered.  Risk Prescription drug management.   ***  {Document critical care time when appropriate:1} {Document review of labs and clinical decision tools ie heart score, Chads2Vasc2 etc:1}  {Document your independent review of radiology images, and any outside records:1} {Document your discussion with family members, caretakers, and with consultants:1} {Document social determinants of health affecting pt's care:1} {Document your decision making why or why not admission, treatments were needed:1} Final Clinical Impression(s) / ED Diagnoses Final diagnoses:  None    Rx / DC Orders ED Discharge  Orders     None

## 2022-07-24 ENCOUNTER — Encounter (HOSPITAL_COMMUNITY): Payer: Self-pay | Admitting: Family Medicine

## 2022-07-24 ENCOUNTER — Other Ambulatory Visit: Payer: Self-pay

## 2022-07-24 DIAGNOSIS — E785 Hyperlipidemia, unspecified: Secondary | ICD-10-CM | POA: Insufficient documentation

## 2022-07-24 DIAGNOSIS — J9621 Acute and chronic respiratory failure with hypoxia: Secondary | ICD-10-CM

## 2022-07-24 DIAGNOSIS — A419 Sepsis, unspecified organism: Secondary | ICD-10-CM | POA: Diagnosis not present

## 2022-07-24 DIAGNOSIS — I10 Essential (primary) hypertension: Secondary | ICD-10-CM

## 2022-07-24 DIAGNOSIS — J9601 Acute respiratory failure with hypoxia: Secondary | ICD-10-CM

## 2022-07-24 DIAGNOSIS — J189 Pneumonia, unspecified organism: Secondary | ICD-10-CM | POA: Diagnosis not present

## 2022-07-24 LAB — BASIC METABOLIC PANEL
Anion gap: 10 (ref 5–15)
BUN: 8 mg/dL (ref 8–23)
CO2: 28 mmol/L (ref 22–32)
Calcium: 8.7 mg/dL — ABNORMAL LOW (ref 8.9–10.3)
Chloride: 98 mmol/L (ref 98–111)
Creatinine, Ser: 0.72 mg/dL (ref 0.44–1.00)
GFR, Estimated: >60 mL/min (ref >60)
Glucose, Bld: 107 mg/dL — ABNORMAL HIGH (ref 70–99)
Potassium: 3.1 mmol/L — ABNORMAL LOW (ref 3.5–5.1)
Sodium: 136 mmol/L (ref 135–145)

## 2022-07-24 LAB — CBC
HCT: 37.3 % (ref 36.0–46.0)
Hemoglobin: 11.8 g/dL — ABNORMAL LOW (ref 12.0–15.0)
MCH: 25.5 pg — ABNORMAL LOW (ref 26.0–34.0)
MCHC: 31.6 g/dL (ref 30.0–36.0)
MCV: 80.6 fL (ref 80.0–100.0)
Platelets: 411 10*3/uL — ABNORMAL HIGH (ref 150–400)
RBC: 4.63 MIL/uL (ref 3.87–5.11)
RDW: 14.6 % (ref 11.5–15.5)
WBC: 11.4 10*3/uL — ABNORMAL HIGH (ref 4.0–10.5)
nRBC: 0 % (ref 0.0–0.2)

## 2022-07-24 LAB — HIV ANTIBODY (ROUTINE TESTING W REFLEX): HIV Screen 4th Generation wRfx: NONREACTIVE

## 2022-07-24 LAB — CORTISOL-AM, BLOOD: Cortisol - AM: 5.7 ug/dL — ABNORMAL LOW (ref 6.7–22.6)

## 2022-07-24 LAB — PROTIME-INR
INR: 1.1 (ref 0.8–1.2)
INR: 1 (ref 0.8–1.2)
Prothrombin Time: 13.9 seconds (ref 11.4–15.2)
Prothrombin Time: 13.4 seconds (ref 11.4–15.2)

## 2022-07-24 LAB — GLUCOSE, CAPILLARY
Glucose-Capillary: 103 mg/dL — ABNORMAL HIGH (ref 70–99)
Glucose-Capillary: 117 mg/dL — ABNORMAL HIGH (ref 70–99)
Glucose-Capillary: 124 mg/dL — ABNORMAL HIGH (ref 70–99)
Glucose-Capillary: 129 mg/dL — ABNORMAL HIGH (ref 70–99)

## 2022-07-24 LAB — HEMOGLOBIN A1C
Hgb A1c MFr Bld: 6.3 % — ABNORMAL HIGH (ref 4.8–5.6)
Mean Plasma Glucose: 134.11 mg/dL

## 2022-07-24 LAB — LACTIC ACID, PLASMA: Lactic Acid, Venous: 1.1 mmol/L (ref 0.5–1.9)

## 2022-07-24 LAB — APTT: aPTT: 34 seconds (ref 24–36)

## 2022-07-24 LAB — PROCALCITONIN: Procalcitonin: <0.1 ng/mL

## 2022-07-24 LAB — CBG MONITORING, ED: Glucose-Capillary: 106 mg/dL — ABNORMAL HIGH (ref 70–99)

## 2022-07-24 MED ORDER — ENOXAPARIN SODIUM 40 MG/0.4ML IJ SOSY
40.0000 mg | PREFILLED_SYRINGE | INTRAMUSCULAR | Status: DC
  Administered 2022-07-24 – 2022-07-26 (×3): 40 mg via SUBCUTANEOUS
  Filled 2022-07-24 (×3): qty 0.4

## 2022-07-24 MED ORDER — ONDANSETRON HCL 4 MG/2ML IJ SOLN: 4.0000 mg | Freq: Four times a day (QID) | INTRAMUSCULAR | Status: DC | PRN

## 2022-07-24 MED ORDER — OXYCODONE HCL 5 MG PO TABS
5.0000 mg | ORAL_TABLET | Freq: Four times a day (QID) | ORAL | Status: DC | PRN
  Administered 2022-07-24 – 2022-07-28 (×10): 5 mg via ORAL
  Filled 2022-07-24 (×10): qty 1

## 2022-07-24 MED ORDER — INSULIN ASPART 100 UNIT/ML IJ SOLN: 0.0000 [IU] | Freq: Every day | INTRAMUSCULAR | Status: DC

## 2022-07-24 MED ORDER — ONDANSETRON HCL 4 MG PO TABS: 4.0000 mg | ORAL_TABLET | Freq: Four times a day (QID) | ORAL | Status: DC | PRN

## 2022-07-24 MED ORDER — TRAZODONE HCL 50 MG PO TABS
25.0000 mg | ORAL_TABLET | Freq: Every evening | ORAL | Status: DC | PRN
  Administered 2022-07-24 – 2022-07-26 (×3): 25 mg via ORAL
  Filled 2022-07-24 (×3): qty 1

## 2022-07-24 MED ORDER — ORAL CARE MOUTH RINSE: 15.0000 mL | OROMUCOSAL | Status: DC | PRN

## 2022-07-24 MED ORDER — MAGNESIUM HYDROXIDE 400 MG/5ML PO SUSP: 30.0000 mL | Freq: Every day | ORAL | Status: DC | PRN

## 2022-07-24 MED ORDER — SODIUM CHLORIDE 0.9 % IV SOLN
2.0000 g | INTRAVENOUS | Status: DC
  Administered 2022-07-24 – 2022-07-27 (×4): 2 g via INTRAVENOUS
  Filled 2022-07-24 (×4): qty 20

## 2022-07-24 MED ORDER — POTASSIUM CHLORIDE CRYS ER 20 MEQ PO TBCR
40.0000 meq | EXTENDED_RELEASE_TABLET | Freq: Once | ORAL | Status: AC
  Administered 2022-07-24: 40 meq via ORAL
  Filled 2022-07-24: qty 2

## 2022-07-24 MED ORDER — SENNOSIDES-DOCUSATE SODIUM 8.6-50 MG PO TABS
1.0000 | ORAL_TABLET | Freq: Every day | ORAL | Status: DC
  Filled 2022-07-24 (×3): qty 1

## 2022-07-24 MED ORDER — ACETAMINOPHEN 650 MG RE SUPP: 650.0000 mg | Freq: Four times a day (QID) | RECTAL | Status: DC | PRN

## 2022-07-24 MED ORDER — ACETAMINOPHEN 325 MG PO TABS: 650.0000 mg | ORAL_TABLET | Freq: Four times a day (QID) | ORAL | Status: DC | PRN

## 2022-07-24 MED ORDER — INSULIN ASPART 100 UNIT/ML IJ SOLN
0.0000 [IU] | Freq: Three times a day (TID) | INTRAMUSCULAR | Status: DC
  Administered 2022-07-24: 1 [IU] via SUBCUTANEOUS
  Administered 2022-07-25: 2 [IU] via SUBCUTANEOUS
  Administered 2022-07-25 – 2022-07-26 (×4): 1 [IU] via SUBCUTANEOUS
  Administered 2022-07-27 (×3): 2 [IU] via SUBCUTANEOUS
  Administered 2022-07-28: 1 [IU] via SUBCUTANEOUS

## 2022-07-24 MED ORDER — POTASSIUM CHLORIDE IN NACL 20-0.9 MEQ/L-% IV SOLN
INTRAVENOUS | Status: AC
  Filled 2022-07-24 (×3): qty 1000

## 2022-07-24 MED ORDER — SODIUM CHLORIDE 0.9 % IV SOLN
500.0000 mg | INTRAVENOUS | Status: AC
  Administered 2022-07-24 – 2022-07-27 (×4): 500 mg via INTRAVENOUS
  Filled 2022-07-24 (×4): qty 5

## 2022-07-24 MED ORDER — POTASSIUM CHLORIDE IN NACL 20-0.9 MEQ/L-% IV SOLN
INTRAVENOUS | Status: DC
  Filled 2022-07-24 (×2): qty 1000

## 2022-07-24 NOTE — Progress Notes (Addendum)
The patient was seen and examined at bedside.  States her cough is improved.  Reports back pain, which is chronic.  History of spinal injection, last one was 3 months ago, follow-up appointment this week.    Vital signs are stable this morning.  She is on 3 L O2  with O2 saturation of 92%.  Not on oxygen supplementation at baseline.  On physical exam, well-developed well-nourished in no acute stress.  She is alert and oriented x 3.  Mild rales at bases bilaterally.  Heart is regular rate and rhythm.  No lower extremity edema.  Continuing Rocephin and azithromycin.  Serum potassium 3.1, repleted orally and intravenously.  IV fluid reduced to NSKCL 20 meq at 50 cc/h.  Time 15 minutes.

## 2022-07-24 NOTE — Assessment & Plan Note (Signed)
-   We will continue statin therapy. 

## 2022-07-24 NOTE — Assessment & Plan Note (Signed)
-   We will continue her antihypertensives. 

## 2022-07-24 NOTE — Assessment & Plan Note (Signed)
-   We will continue his oral antidiabetic therapy.

## 2022-07-24 NOTE — H&P (Signed)
[place]   PATIENT NAME: Rhonda Blackwell    MR#:  [id number]  DATE OF BIRTH:  [birth year]  DATE OF ADMISSION:  07/23/2022  PRIMARY CARE PHYSICIAN: [name], MD   Patient is coming from: Home  REQUESTING/REFERRING PHYSICIAN: Evlyn Courier, PA-C   CHIEF COMPLAINT:   Chief Complaint  Patient presents with   Back Pain    HISTORY OF PRESENT ILLNESS:  Rhonda Blackwell is a 72 y.o. 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  She admits to cough productive of whitish sputum over the last 3 days with associated wheezing and dyspnea.  She denies any fever or chills.  No nausea or vomiting or abdominal pain.  No dysuria, oliguria or hematuria or flank pain.  She denies any bleeding diathesis.  ED Course: When she came to the ER, BP was 143/71 with pulse oximetry of 86% on room air and 96% on 2 L of O2 by nasal cannula.  Vital signs were otherwise normal.  Labs revealed mild hypokalemia of 3.4 with chloride of 95 and CBC with WBC of 13 with neutrophilia. EKG as reviewed by me : Pending. Imaging: CT of the abdomen and pelvis showed the following: 1. No CT evidence of acute abdominal/pelvic process. 2. Multiple punctate bilateral renal calculi without evidence of hydronephrosis or ureteral calculus. 3. Normal appendix. No evidence of colitis or diverticulitis. 4. 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 5. 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 6. Aortic atherosclerosis.  Portable chest x-ray showed the following: Focal airspace disease in the left upper lobe with interstitial prominence at the lung bases bilaterally, suspicious for pneumonia.  The patient was given IV Rocephin and  Zithromax.  She will be admitted to a medical telemetry bed for further evaluation and management. PAST MEDICAL HISTORY:   Past Medical History:  Diagnosis Date   Bronchitis    COPD (chronic obstructive pulmonary disease) ([place])    Diabetes mellitus without complication ([place])    Hypertension     PAST SURGICAL HISTORY:   Past Surgical History:  Procedure Laterality Date   CESAREAN SECTION     CHOLECYSTECTOMY     FOOT SURGERY     HERNIA REPAIR      SOCIAL HISTORY:   Social History   Tobacco Use   Smoking status: Some Days    Packs/day: 1.00    Types: Cigarettes   Smokeless tobacco: Never  Substance Use Topics   Alcohol use: No    FAMILY HISTORY:   Family History  Problem Relation Age of Onset   Diabetes Mellitus II Sister     DRUG ALLERGIES:   Allergies  Allergen Reactions   Hydrochlorothiazide Nausea Only and Swelling    Legs were swollen   Amlodipine Swelling   Nebivolol Nausea Only    Abdominal pain    REVIEW OF SYSTEMS:   ROS As per history of present illness. All pertinent systems were reviewed above. Constitutional, HEENT, cardiovascular, respiratory, GI, GU, musculoskeletal, neuro, psychiatric, endocrine, integumentary and hematologic systems were reviewed and are otherwise negative/unremarkable except for positive findings mentioned above in the HPI.   MEDICATIONS AT HOME:   Prior to Admission medications   Medication Sig Start Date  End Date Taking? Authorizing Provider  acetaminophen (TYLENOL) 650 MG CR tablet Take 650 mg by mouth every 8 (eight) hours as needed for pain.   Yes [provider]  albuterol (PROVENTIL HFA;VENTOLIN HFA) 108 (90 BASE) MCG/ACT inhaler Inhale 2 puffs into the lungs every 6 (six) hours as needed for shortness of breath (cough). 05/28/15  Yes Mesner, Corene Cornea, MD  aspirin EC 81 MG tablet Take 81 mg by mouth daily. Swallow whole.   Yes [provider]  atorvastatin (LIPITOR) 40 MG tablet Take 40 mg by mouth  daily.   Yes [provider]  Calcium-Magnesium-Vitamin D (CALCIUM 1200+D3 PO) Take 1 capsule by mouth daily.   Yes [provider]  DM-Phenylephrine-Acetaminophen (VICKS DAYQUIL COLD & FLU) 10-5-325 MG CAPS Take 2 capsules by mouth every 6 (six) hours as needed (for cough).   Yes [provider]  DULoxetine (CYMBALTA) 60 MG capsule Take 60 mg by mouth 2 (two) times daily. 06/05/22  Yes [provider]  fluticasone (FLONASE) 50 MCG/ACT nasal spray Place 2 sprays into both nostrils daily.   Yes [provider]  fluticasone-salmeterol (ADVAIR) 250-50 MCG/ACT AEPB Inhale 1 puff into the lungs in the morning and at bedtime. 11/24/21  Yes [provider]  furosemide (LASIX) 20 MG tablet Take 20 mg by mouth daily. 07/18/22  Yes [provider]  irbesartan (AVAPRO) 150 MG tablet Take 150 mg by mouth 2 (two) times daily.   Yes [provider]  labetalol (NORMODYNE) 100 MG tablet Take 100 mg by mouth 2 (two) times daily.   Yes [provider]  Magnesium 250 MG TABS Take 500 mg by mouth daily.   Yes [provider]  Multiple Vitamins-Minerals (ONE-A-DAY WOMENS 50 PLUS) TABS Take 1 tablet by mouth daily.   Yes [provider]  NIFEdipine (PROCARDIA XL/NIFEDICAL-XL) 90 MG 24 hr tablet Take 90 mg by mouth daily. 05/22/22  Yes [provider]  Omega-3 Fatty Acids ([employer] FISH OIL HALF-THE-SIZE) 500 MG CAPS Take 500 mg by mouth daily.   Yes [provider]  pantoprazole (PROTONIX) 40 MG tablet Take 40 mg by mouth daily.   Yes [provider]  sitaGLIPtan-metformin (JANUMET) 50-500 MG per tablet Take 1 tablet by mouth 2 (two) times daily with a meal.   Yes [provider]      VITAL SIGNS:  Blood pressure (!) 153/81, pulse 86, temperature 98.6 F (37 C), temperature source Oral, resp. rate (!) 23, height '5\' 3"'$  (1.6 m), weight 77 kg, SpO2 93 %.  PHYSICAL EXAMINATION:  Physical  Exam  GENERAL:  72 y.o.-year-old female patient lying in the bed with mild respiratory distress with conversational dyspnea. EYES: Pupils equal, round, reactive to light and accommodation. No scleral icterus. Extraocular muscles intact.  HEENT: Head atraumatic, normocephalic. Oropharynx and nasopharynx clear.  NECK:  Supple, no jugular venous distention. No thyroid enlargement, no tenderness.  LUNGS: Diminished bibasal and left upper lung zone breath sounds with associated crackles.  No use of accessory muscles of respiration.  CARDIOVASCULAR: Regular rate and rhythm, S1, S2 normal. No murmurs, rubs, or gallops.  ABDOMEN: Soft, nondistended, nontender. Bowel sounds present. No organomegaly or mass.  EXTREMITIES: No pedal edema, cyanosis, or clubbing.  NEUROLOGIC: Cranial nerves II through XII are intact. Muscle strength 5/5 in all extremities. Sensation intact. Gait not checked.  PSYCHIATRIC: The patient is alert and oriented x 3.  Normal affect and good eye contact. SKIN: No obvious rash, lesion, or ulcer.  LABORATORY PANEL:   CBC Recent Labs  Lab 07/24/22 0243  WBC 11.4*  HGB 11.8*  HCT 37.3  PLT 411*   ------------------------------------------------------------------------------------------------------------------  Chemistries  Recent Labs  Lab 07/23/22 2032 07/23/22 2039 07/24/22 0243  NA 133*   < > 136  K 3.4*   < > 3.1*  CL 95*   < > 98  CO2 25  --  28  GLUCOSE 103*   < > 107*  BUN 10   < > 8  CREATININE 0.90   < > 0.72  CALCIUM 8.8*  --  8.7*  AST 20  --   --   ALT 15  --   --   ALKPHOS 62  --   --   BILITOT 0.7  --   --    < > = values in this interval not displayed.   ------------------------------------------------------------------------------------------------------------------  Cardiac Enzymes No results for input(s): "TROPONINI" in the last 168  hours. ------------------------------------------------------------------------------------------------------------------  RADIOLOGY:  CT L-SPINE NO CHARGE  Result Date: 07/23/2022 CLINICAL DATA:  Low back pain. EXAM: CT LUMBAR SPINE WITHOUT CONTRAST TECHNIQUE: Multidetector CT imaging of the lumbar spine was performed without intravenous contrast administration. Multiplanar CT image reconstructions were also generated. RADIATION DOSE REDUCTION: This exam was performed according to the departmental dose-optimization program which includes automated exposure control, adjustment of the mA and/or kV according to patient size and/or use of iterative reconstruction technique. COMPARISON:  None Available. FINDINGS: Segmentation: 5 lumbar type vertebrae. Alignment: Mild anterolisthesis of L4 Vertebrae: No acute fracture or focal pathologic process. Osteopenia. Paraspinal and other soft tissues: Negative. Disc levels: T12-L1: Disc height loss and mild spinal canal stenosis. L1-L2: No significant disc bulge, spinal canal or neural foraminal stenosis. L2-L3: Disc bulge with moderate bilateral lateral recess stenosis no significant neural foraminal stenosis L3-L4: Disc bulge with moderate bilateral lateral recess stenosis. No significant neural foraminal stenosis. Mild facet joint arthropathy L4-L5: Broad-based disc bulge with severe spinal canal and lateral recess stenosis bilaterally. Mild bilateral neural foraminal stenosis L5-S1: Disc bulge with mild bilateral lateral recess stenosis. No significant neural foraminal stenosis. Mild bilateral facet joint arthropathy IMPRESSION: 1. No acute fracture or subluxation. 2. 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 3. Multilevel facet joint arthropathy. Electronically Signed   By: Keane Police D.O.   On: 07/23/2022 23:43   CT ABDOMEN PELVIS W CONTRAST  Result Date: 07/23/2022 CLINICAL DATA:  Abdominal pain, acute  EXAM: CT ABDOMEN AND PELVIS WITH CONTRAST TECHNIQUE: Multidetector CT imaging of the abdomen and pelvis was performed using the standard protocol following bolus administration of intravenous contrast. RADIATION DOSE REDUCTION: This exam was performed according to the departmental dose-optimization program which includes automated exposure control, adjustment of the mA and/or kV according to patient size and/or use of iterative reconstruction technique. CONTRAST:  59m OMNIPAQUE IOHEXOL 350 MG/ML SOLN COMPARISON:  Examination dated August 02, 2015 FINDINGS: Lower chest: No acute abnormality.  Left basilar atelectasis. Hepatobiliary: No focal liver abnormality is seen. Status post cholecystectomy. No biliary dilatation. Pancreas: Unremarkable. No pancreatic ductal dilatation or surrounding inflammatory changes. Spleen: Normal in size without focal abnormality. Adrenals/Urinary Tract: Adrenal glands are unremarkable. Multiple punctate bilateral renal calculi without evidence of hydronephrosis or ureteral calculus. No evidence of pyelonephritis or perinephric fat stranding. Bladder is unremarkable. Stomach/Bowel: Stomach is within normal limits. Appendix appears normal. No evidence of bowel wall thickening, distention, or inflammatory changes. Vascular/Lymphatic: Prominent aortic atherosclerosis of the infrarenal abdominal aorta, which is  however normal in caliber. No enlarged abdominal or pelvic lymph nodes. 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 No adnexal mass Other: No abdominal wall hernia or abnormality. No abdominopelvic ascites. Musculoskeletal: Mild multilevel degenerate disc disease of the lumbar spine. No acute osseous abnormality. IMPRESSION: 1. No CT evidence of acute abdominal/pelvic process. 2. Multiple punctate bilateral renal calculi without evidence of hydronephrosis or ureteral calculus. 3. Normal appendix. No evidence of colitis  or diverticulitis. 4. 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 5. 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 6. Aortic atherosclerosis. Electronically Signed   By: Keane Police D.O.   On: 07/23/2022 23:33   DG Chest Portable 1 View  Result Date: 07/23/2022 CLINICAL DATA:  Chest pain. EXAM: PORTABLE CHEST 1 VIEW COMPARISON:  08/02/2015. FINDINGS: The heart size and mediastinal contours are within normal limits. There is atherosclerotic calcification of the aorta. Focal airspace disease is noted in the left upper lobe. Interstitial prominence is present at the lung bases bilaterally. No effusion or pneumothorax. No acute osseous abnormality. IMPRESSION: Focal airspace disease in the left upper lobe with interstitial prominence at the lung bases bilaterally, suspicious for pneumonia. Electronically Signed   By: Brett Fairy M.D.   On: 07/23/2022 20:20      IMPRESSION AND PLAN:  Assessment and Plan: * Sepsis due to pneumonia [facility]) - This is due to likely multifocal pneumonia that is community-acquired. - She will be admitted to a medical telemetry bed. - Will continue antibiotic therapy with IV Rocephin and Zithromax. - Mucolytic therapy will be provided as well as bronchodilator therapy. - We will follow blood cultures. - O2 protocol will be followed.  Acute respiratory failure with hypoxia ([facility]) - O2 protocol will be followed as mentioned above. - Management otherwise as above.  Essential hypertension - We will continue her antihypertensives.  Type 2 diabetes mellitus without complications ([facility]) - We will continue his oral antidiabetic therapy.  Dyslipidemia - We will continue statin therapy.   DVT prophylaxis: Lovenox.  Advanced Care Planning:  Code Status: full code.  Family Communication:  The plan of care was discussed in details with the patient (and family). I answered all questions. The patient agreed to proceed with  the above mentioned plan. Further management will depend upon hospital course. Disposition Plan: Back to previous home environment Consults called: none.  All the records are reviewed and case discussed with ED provider.  Status is: Inpatient   At the time of the admission, it appears that the appropriate admission status for this patient is inpatient.  This is judged to be reasonable and necessary in order to provide the required intensity of service to ensure the patient's safety given the presenting symptoms, physical exam findings and initial radiographic and laboratory data in the context of comorbid conditions.  The patient requires inpatient status due to high intensity of service, high risk of further deterioration and high frequency of surveillance required.  I certify that at the time of admission, it is my clinical judgment that the patient will require inpatient hospital care extending more than 2 midnights.                            Dispo: The patient is from: Home              Anticipated d/c is to: Home  Patient currently is not medically stable to d/c.              Difficult to place patient: No  Christel Mormon M.D on 07/24/2022 at 3:25 AM  [facility]   From 7 PM-7 AM, contact night-coverage [email or web address]  CC: Primary care physician; [name], MD

## 2022-07-24 NOTE — ED Notes (Signed)
ED TO INPATIENT HANDOFF REPORT  ED Nurse Name and Phone #: [place] RN [phone]  S Name/Age/Gender Rhonda Blackwell 72 y.o. female Room/Bed: 021C/021C  Code Status   Code Status: Full Code  Home/SNF/Other Home Patient oriented to: self, place, time, and situation Is this baseline? Yes   Triage Complete: Triage complete  Chief Complaint Sepsis due to pneumonia ([place]) [J18.9, A41.9]  Triage Note Pt endorses all over back pain for 3 days. Pt had injection 3 months ago. Pain is chronic. Pt also reports recent cancer diagnosis from [facility].    Allergies Allergies  Allergen Reactions   Hydrochlorothiazide Nausea Only and Swelling    Legs were swollen   Amlodipine Swelling   Nebivolol Nausea Only    Abdominal pain    Level of Care/Admitting Diagnosis ED Disposition     ED Disposition  Admit   Condition  --   Comment  Hospital Area: [place] [[id number]]  Level of Care: Telemetry Medical [[id number]]  May admit patient to [facility] or [facility] if equivalent level of care is available:: No  Covid Evaluation: Asymptomatic - no recent exposure (last 10 days) testing not required  Diagnosis: Sepsis due to pneumonia [facility]) [[id number]]  Admitting Physician: Christel Mormon [[id number]]  Attending Physician: Christel Mormon [[id number]]  Certification:: I certify this patient will need inpatient services for at least 2 midnights  Estimated Length of Stay: 2          B Medical/Surgery History Past Medical History:  Diagnosis Date   Bronchitis    COPD (chronic obstructive pulmonary disease) ([place])    Diabetes mellitus without complication ([place])    Hypertension    Past Surgical History:  Procedure Laterality Date   CESAREAN SECTION     CHOLECYSTECTOMY     FOOT SURGERY     HERNIA REPAIR       A IV Location/Drains/Wounds Patient Lines/Drains/Airways Status     Active Line/Drains/Airways     Name Placement date Placement time Site Days   Peripheral IV 07/23/22 20 G 1"  Anterior;Distal;Right Forearm 07/23/22  2022  Forearm  1   Peripheral IV 07/23/22 20 G 1" Left Antecubital 07/23/22  2313  Antecubital  1            Intake/Output Last 24 hours No intake or output data in the 24 hours ending 07/24/22 0050  Labs/Imaging Results for orders placed or performed during the hospital encounter of 07/23/22 (from the past 48 hour(s))  CBC with Differential     Status: Abnormal   Collection Time: 07/23/22  8:32 PM  Result Value Ref Range   WBC 13.0 (H) 4.0 - 10.5 K/uL   RBC 4.75 3.87 - 5.11 MIL/uL   Hemoglobin 12.2 12.0 - 15.0 g/dL   HCT 38.0 36.0 - 46.0 %   MCV 80.0 80.0 - 100.0 fL   MCH 25.7 (L) 26.0 - 34.0 pg   MCHC 32.1 30.0 - 36.0 g/dL   RDW 14.5 11.5 - 15.5 %   Platelets 426 (H) 150 - 400 K/uL   nRBC 0.0 0.0 - 0.2 %   Neutrophils Relative % 73 %   Neutro Abs 9.7 (H) 1.7 - 7.7 K/uL   Lymphocytes Relative 17 %   Lymphs Abs 2.2 0.7 - 4.0 K/uL   Monocytes Relative 8 %   Monocytes Absolute 1.0 0.1 - 1.0 K/uL   Eosinophils Relative 0 %   Eosinophils Absolute 0.1 0.0 - 0.5 K/uL   Basophils Relative 1 %  Basophils Absolute 0.1 0.0 - 0.1 K/uL   Immature Granulocytes 1 %   Abs Immature Granulocytes 0.06 0.00 - 0.07 K/uL    Comment: Performed at [facility], [place] [address], [place], [place] [address]  Comprehensive metabolic panel     Status: Abnormal   Collection Time: 07/23/22  8:32 PM  Result Value Ref Range   Sodium 133 (L) 135 - 145 mmol/L   Potassium 3.4 (L) 3.5 - 5.1 mmol/L   Chloride 95 (L) 98 - 111 mmol/L   CO2 25 22 - 32 mmol/L   Glucose, Bld 103 (H) 70 - 99 mg/dL    Comment: Glucose reference range applies only to samples taken after fasting for at least 8 hours.   BUN 10 8 - 23 mg/dL   Creatinine, Ser 0.90 0.44 - 1.00 mg/dL   Calcium 8.8 (L) 8.9 - 10.3 mg/dL   Total Protein 6.8 6.5 - 8.1 g/dL   Albumin 3.5 3.5 - 5.0 g/dL   AST 20 15 - 41 U/L   ALT 15 0 - 44 U/L   Alkaline Phosphatase 62 38 - 126 U/L   Total Bilirubin 0.7  0.3 - 1.2 mg/dL   GFR, Estimated >60 >60 mL/min    Comment: (NOTE) Calculated using the CKD-EPI Creatinine Equation (2021)    Anion gap 13 5 - 15    Comment: Performed at [facility] [address] [place], [place] [address]  Lipase, blood     Status: None   Collection Time: 07/23/22  8:32 PM  Result Value Ref Range   Lipase 38 11 - 51 U/L    Comment: Performed at [facility] [address], [place], [place] [address]  Resp panel by RT-PCR (RSV, Flu A&B, Covid) Anterior Nasal Swab     Status: None   Collection Time: 07/23/22  8:32 PM   Specimen: Anterior Nasal Swab  Result Value Ref Range   SARS Coronavirus 2 by RT PCR NEGATIVE NEGATIVE   Influenza A by PCR NEGATIVE NEGATIVE   Influenza B by PCR NEGATIVE NEGATIVE    Comment: (NOTE) The Xpert Xpress SARS-CoV-2/FLU/RSV plus assay is intended as an aid in the diagnosis of influenza from Nasopharyngeal swab specimens and should not be used as a sole basis for treatment. Nasal washings and aspirates are unacceptable for Xpert Xpress SARS-CoV-2/FLU/RSV testing.  Fact Sheet for Patients: EntrepreneurPulse.com.au  Fact Sheet for Healthcare Providers: IncredibleEmployment.be  This test is not yet approved or cleared by the [place] FDA and has been authorized for detection and/or diagnosis of SARS-CoV-2 by FDA under an Emergency Use Authorization (EUA). This EUA will remain in effect (meaning this test can be used) for the duration of the COVID-19 declaration under Section 564(b)(1) of the Act, 21 U.S.C. section 360bbb-3(b)(1), unless the authorization is terminated or revoked.     Resp Syncytial Virus by PCR NEGATIVE NEGATIVE    Comment: (NOTE) Fact Sheet for Patients: EntrepreneurPulse.com.au  Fact Sheet for Healthcare Providers: IncredibleEmployment.be  This test is not yet approved or cleared by the [place] FDA and has been  authorized for detection and/or diagnosis of SARS-CoV-2 by FDA under an Emergency Use Authorization (EUA). This EUA will remain in effect (meaning this test can be used) for the duration of the COVID-19 declaration under Section 564(b)(1) of the Act, 21 U.S.C. section 360bbb-3(b)(1), unless the authorization is terminated or revoked.  Performed at [facility], [place] [address], [place], [place] [address]  Troponin I (High Sensitivity)     Status: None   Collection Time: 07/23/22  8:32 PM  Result Value Ref Range   Troponin I (High Sensitivity) 8 <18 ng/L    Comment: (NOTE) Elevated high sensitivity troponin I (hsTnI) values and significant  changes across serial measurements may suggest ACS but many other  chronic and acute conditions are known to elevate hsTnI results.  Refer to the "Links" section for chest pain algorithms and additional  guidance. Performed at [facility], [place] [address], [place], [place] [address]   I-stat chem 8, ED (not at [facility], [facility] or [facility])     Status: Abnormal   Collection Time: 07/23/22  8:39 PM  Result Value Ref Range   Sodium 136 135 - 145 mmol/L   Potassium 3.4 (L) 3.5 - 5.1 mmol/L   Chloride 95 (L) 98 - 111 mmol/L   BUN 10 8 - 23 mg/dL   Creatinine, Ser 0.90 0.44 - 1.00 mg/dL   Glucose, Bld 102 (H) 70 - 99 mg/dL    Comment: Glucose reference range applies only to samples taken after fasting for at least 8 hours.   Calcium, Ion 1.08 (L) 1.15 - 1.40 mmol/L   TCO2 26 22 - 32 mmol/L   Hemoglobin 13.3 12.0 - 15.0 g/dL   HCT 39.0 36.0 - 46.0 %  Urinalysis, Routine w reflex microscopic -Urine, Clean Catch     Status: Abnormal   Collection Time: 07/23/22 10:40 PM  Result Value Ref Range   Color, Urine YELLOW YELLOW   APPearance HAZY (A) CLEAR   Specific Gravity, Urine 1.015 1.005 - 1.030   pH 6.0 5.0 - 8.0   Glucose, UA NEGATIVE NEGATIVE mg/dL   Hgb urine dipstick NEGATIVE NEGATIVE   Bilirubin Urine NEGATIVE NEGATIVE   Ketones, ur  NEGATIVE NEGATIVE mg/dL   Protein, ur NEGATIVE NEGATIVE mg/dL   Nitrite NEGATIVE NEGATIVE   Leukocytes,Ua SMALL (A) NEGATIVE   RBC / HPF 0-5 0 - 5 RBC/hpf   WBC, UA 11-20 0 - 5 WBC/hpf   Bacteria, UA FEW (A) NONE SEEN   Squamous Epithelial / HPF 0-5 0 - 5 /HPF   Mucus PRESENT     Comment: Performed at [facility], [address] [place], [place] [address]  Troponin I (High Sensitivity)     Status: None   Collection Time: 07/23/22 10:47 PM  Result Value Ref Range   Troponin I (High Sensitivity) 7 <18 ng/L    Comment: (NOTE) Elevated high sensitivity troponin I (hsTnI) values and significant  changes across serial measurements may suggest ACS but many other  chronic and acute conditions are known to elevate hsTnI results.  Refer to the "Links" section for chest pain algorithms and additional  guidance. Performed at [facility], [place] [address] [place], [place] [address]   Lactic acid, plasma     Status: None   Collection Time: 07/23/22 11:00 PM  Result Value Ref Range   Lactic Acid, Venous 0.9 0.5 - 1.9 mmol/L    Comment: Performed at [facility] [address], [place], [place] [address]  CBG monitoring, ED     Status: Abnormal   Collection Time: 07/24/22 12:47 AM  Result Value Ref Range   Glucose-Capillary 106 (H) 70 - 99 mg/dL    Comment: Glucose reference range applies only to samples taken after fasting for at least 8 hours.   CT L-SPINE NO CHARGE  Result Date: 07/23/2022 CLINICAL DATA:  Low back pain. EXAM: CT LUMBAR SPINE WITHOUT CONTRAST TECHNIQUE: Multidetector CT imaging of the lumbar spine was performed without intravenous contrast administration. Multiplanar CT image reconstructions were also generated. RADIATION DOSE REDUCTION: This exam was performed according to the departmental dose-optimization program which includes automated exposure control, adjustment of the mA and/or kV according to patient size and/or use of iterative reconstruction  technique. COMPARISON:  None Available. FINDINGS: Segmentation: 5 lumbar type vertebrae. Alignment: Mild anterolisthesis of L4 Vertebrae: No acute fracture or focal pathologic process. Osteopenia. Paraspinal and other soft tissues: Negative. Disc levels: T12-L1: Disc height loss and mild spinal canal stenosis. L1-L2: No significant disc bulge, spinal canal or neural foraminal stenosis. L2-L3: Disc bulge with moderate bilateral lateral recess stenosis no significant neural foraminal stenosis L3-L4: Disc bulge with moderate bilateral lateral recess stenosis. No significant neural foraminal stenosis. Mild facet joint arthropathy L4-L5: Broad-based disc bulge with severe spinal canal and lateral recess stenosis bilaterally. Mild bilateral neural foraminal stenosis L5-S1: Disc bulge with mild bilateral lateral recess stenosis. No significant neural foraminal stenosis. Mild bilateral facet joint arthropathy IMPRESSION: 1. No acute fracture or subluxation. 2. 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 3. Multilevel facet joint arthropathy. Electronically Signed   By: Keane Police D.O.   On: 07/23/2022 23:43   CT ABDOMEN PELVIS W CONTRAST  Result Date: 07/23/2022 CLINICAL DATA:  Abdominal pain, acute EXAM: CT ABDOMEN AND PELVIS WITH CONTRAST TECHNIQUE: Multidetector CT imaging of the abdomen and pelvis was performed using the standard protocol following bolus administration of intravenous contrast. RADIATION DOSE REDUCTION: This exam was performed according to the departmental dose-optimization program which includes automated exposure control, adjustment of the mA and/or kV according to patient size and/or use of iterative reconstruction technique. CONTRAST:  70m OMNIPAQUE IOHEXOL 350 MG/ML SOLN COMPARISON:  Examination dated August 02, 2015 FINDINGS: Lower chest: No acute abnormality.  Left basilar atelectasis. Hepatobiliary: No focal liver abnormality is  seen. Status post cholecystectomy. No biliary dilatation. Pancreas: Unremarkable. No pancreatic ductal dilatation or surrounding inflammatory changes. Spleen: Normal in size without focal abnormality. Adrenals/Urinary Tract: Adrenal glands are unremarkable. Multiple punctate bilateral renal calculi without evidence of hydronephrosis or ureteral calculus. No evidence of pyelonephritis or perinephric fat stranding. Bladder is unremarkable. Stomach/Bowel: Stomach is within normal limits. Appendix appears normal. No evidence of bowel wall thickening, distention, or inflammatory changes. Vascular/Lymphatic: Prominent aortic atherosclerosis of the infrarenal abdominal aorta, which is however normal in caliber. No enlarged abdominal or pelvic lymph nodes. 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 No adnexal mass Other: No abdominal wall hernia or abnormality. No abdominopelvic ascites. Musculoskeletal: Mild multilevel degenerate disc disease of the lumbar spine. No acute osseous abnormality. IMPRESSION: 1. No CT evidence of acute abdominal/pelvic process. 2. Multiple punctate bilateral renal calculi without evidence of hydronephrosis or ureteral calculus. 3. Normal appendix. No evidence of colitis or diverticulitis. 4. 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 5. 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 6. Aortic atherosclerosis. Electronically Signed   By: IKeane PoliceD.O.   On: 07/23/2022 23:33   DG Chest Portable 1 View  Result Date: 07/23/2022 CLINICAL DATA:  Chest pain. EXAM: PORTABLE CHEST 1 VIEW COMPARISON:  08/02/2015. FINDINGS: The heart size and mediastinal contours are within normal limits. There is atherosclerotic calcification of the aorta. Focal airspace disease is noted in the left upper lobe. Interstitial prominence is present  at the lung bases bilaterally. No effusion or  pneumothorax. No acute osseous abnormality. IMPRESSION: Focal airspace disease in the left upper lobe with interstitial prominence at the lung bases bilaterally, suspicious for pneumonia. Electronically Signed   By: Brett Fairy M.D.   On: 07/23/2022 20:20    Pending Labs Unresulted Labs (From admission, onward)     Start     Ordered   07/24/22 0500  Protime-INR  Tomorrow morning,   R        07/24/22 0010   07/24/22 0500  Cortisol-am, blood  Tomorrow morning,   R        07/24/22 0010   07/24/22 0500  Procalcitonin  Tomorrow morning,   R        07/24/22 0010   07/24/22 2725  Basic metabolic panel  Tomorrow morning,   R        07/24/22 0010   07/24/22 0500  CBC  Tomorrow morning,   R        07/24/22 0010   07/24/22 0013  Hemoglobin A1c  Once,   R       Comments: To assess prior glycemic control    07/24/22 0012   07/23/22 2356  HIV Antibody (routine testing w rflx)  (HIV Antibody (Routine testing w reflex) panel)  Once,   R        07/24/22 0010   07/23/22 2351  Protime-INR  (Septic presentation on arrival (screening labs, nursing and treatment orders for obvious sepsis))  ONCE - STAT,   STAT        07/23/22 2351   07/23/22 2351  APTT  (Septic presentation on arrival (screening labs, nursing and treatment orders for obvious sepsis))  ONCE - STAT,   STAT        07/23/22 2351   07/23/22 2249  Blood culture (routine x 2)  BLOOD CULTURE X 2,   R      07/23/22 2248   07/23/22 2249  Lactic acid, plasma  Now then every 2 hours,   R      07/23/22 2248            Vitals/Pain Today's Vitals   07/23/22 2300 07/23/22 2300 07/23/22 2330 07/24/22 0030  BP: 133/71  131/81 139/67  Pulse: 86  87 81  Resp: '19  19 17  '$ Temp:  98.4 F (36.9 C)    TempSrc:  Oral    SpO2: 91%  92% 96%  Weight:      Height:      PainSc:        Isolation Precautions Airborne and Contact precautions  Medications Medications  azithromycin (ZITHROMAX) 500 mg in sodium chloride 0.9 % 250 mL IVPB (500 mg  Intravenous New Bag/Given 07/24/22 0039)  lactated ringers infusion (0 mLs Intravenous Hold 07/24/22 0028)  lactated ringers bolus 1,000 mL (1,000 mLs Intravenous New Bag/Given 07/24/22 0040)    And  lactated ringers bolus 1,000 mL (1,000 mLs Intravenous New Bag/Given 07/24/22 0040)    And  lactated ringers bolus 500 mL (has no administration in time range)  enoxaparin (LOVENOX) injection 40 mg (has no administration in time range)  0.9 % NaCl with KCl 20 mEq/ L  infusion ( Intravenous New Bag/Given 07/24/22 0039)  cefTRIAXone (ROCEPHIN) 2 g in sodium chloride 0.9 % 100 mL IVPB (has no administration in time range)  azithromycin (ZITHROMAX) 500 mg in sodium chloride 0.9 % 250 mL IVPB (has no administration in time range)  acetaminophen (TYLENOL) tablet  650 mg (has no administration in time range)    Or  acetaminophen (TYLENOL) suppository 650 mg (has no administration in time range)  traZODone (DESYREL) tablet 25 mg (has no administration in time range)  magnesium hydroxide (MILK OF MAGNESIA) suspension 30 mL (has no administration in time range)  ondansetron (ZOFRAN) tablet 4 mg (has no administration in time range)    Or  ondansetron (ZOFRAN) injection 4 mg (has no administration in time range)  insulin aspart (novoLOG) injection 0-9 Units (has no administration in time range)  insulin aspart (novoLOG) injection 0-5 Units ( Subcutaneous Not Given 07/24/22 0048)  ondansetron (ZOFRAN) injection 4 mg (4 mg Intravenous Given 07/23/22 2023)  morphine (PF) 4 MG/ML injection 4 mg (4 mg Intravenous Given 07/23/22 2024)  cefTRIAXone (ROCEPHIN) 1 g in sodium chloride 0.9 % 100 mL IVPB (0 g Intravenous Stopped 07/24/22 0028)  iohexol (OMNIPAQUE) 350 MG/ML injection 75 mL (75 mLs Intravenous Contrast Given 07/23/22 2317)    Mobility walks     Focused Assessments    R Recommendations: See Admitting Provider Note  Report given to:   Additional Notes:  Pt is alert and oriented x4, on 2L [place].

## 2022-07-24 NOTE — Assessment & Plan Note (Addendum)
-   O2 protocol will be followed as mentioned above. - Management otherwise as above.

## 2022-07-24 NOTE — Assessment & Plan Note (Addendum)
-   This is due to likely multifocal pneumonia that is community-acquired. - She will be admitted to a medical telemetry bed. - Will continue antibiotic therapy with IV Rocephin and Zithromax. - Mucolytic therapy will be provided as well as bronchodilator therapy. - We will follow blood cultures. - O2 protocol will be followed.

## 2022-07-24 NOTE — Progress Notes (Signed)
  Transition of Care [facility]) Screening Note   Patient Details  Name: Rhonda Blackwell Date of Birth: 10/04/1950   [facility]) CM/SW Contact:    Tom-Johnson, Renea Ee, RN Phone Number: 07/24/2022, 3:20 PM  Patient is admitted for Sepsis 2/2 Pneumonia. On IV abx. Currently on acute 3L O2, does not use O2 at home.  From home with husband, has two children. Husband transports to appointments. Has a cane, walker and shower seat at home.  PCP is  [name], MD and uses [facility] on [address] and also [facility] home delivery.   Transition of Care Department [facility]) has reviewed patient and no TOC needs or recommendations have been identified at this time. TOC will continue to monitor patient advancement through interdisciplinary progression rounds. If new patient transition needs arise, please place a TOC consult.

## 2022-07-25 DIAGNOSIS — J189 Pneumonia, unspecified organism: Secondary | ICD-10-CM | POA: Diagnosis not present

## 2022-07-25 DIAGNOSIS — A419 Sepsis, unspecified organism: Secondary | ICD-10-CM | POA: Diagnosis not present

## 2022-07-25 LAB — CBC
HCT: 37.8 % (ref 36.0–46.0)
Hemoglobin: 12 g/dL (ref 12.0–15.0)
MCH: 25.5 pg — ABNORMAL LOW (ref 26.0–34.0)
MCHC: 31.7 g/dL (ref 30.0–36.0)
MCV: 80.4 fL (ref 80.0–100.0)
Platelets: 371 10*3/uL (ref 150–400)
RBC: 4.7 MIL/uL (ref 3.87–5.11)
RDW: 14.3 % (ref 11.5–15.5)
WBC: 10.6 10*3/uL — ABNORMAL HIGH (ref 4.0–10.5)
nRBC: 0 % (ref 0.0–0.2)

## 2022-07-25 LAB — BASIC METABOLIC PANEL
Anion gap: 6 (ref 5–15)
BUN: 6 mg/dL — ABNORMAL LOW (ref 8–23)
CO2: 26 mmol/L (ref 22–32)
Calcium: 8.9 mg/dL (ref 8.9–10.3)
Chloride: 102 mmol/L (ref 98–111)
Creatinine, Ser: 0.63 mg/dL (ref 0.44–1.00)
GFR, Estimated: >60 mL/min (ref >60)
Glucose, Bld: 119 mg/dL — ABNORMAL HIGH (ref 70–99)
Potassium: 3.9 mmol/L (ref 3.5–5.1)
Sodium: 134 mmol/L — ABNORMAL LOW (ref 135–145)

## 2022-07-25 LAB — PHOSPHORUS: Phosphorus: 3.6 mg/dL (ref 2.5–4.6)

## 2022-07-25 LAB — MAGNESIUM: Magnesium: 1.8 mg/dL (ref 1.7–2.4)

## 2022-07-25 LAB — GLUCOSE, CAPILLARY
Glucose-Capillary: 121 mg/dL — ABNORMAL HIGH (ref 70–99)
Glucose-Capillary: 119 mg/dL — ABNORMAL HIGH (ref 70–99)
Glucose-Capillary: 182 mg/dL — ABNORMAL HIGH (ref 70–99)
Glucose-Capillary: 137 mg/dL — ABNORMAL HIGH (ref 70–99)

## 2022-07-25 MED ORDER — ATORVASTATIN CALCIUM 40 MG PO TABS
40.0000 mg | ORAL_TABLET | Freq: Every day | ORAL | Status: DC
  Administered 2022-07-25 – 2022-07-28 (×4): 40 mg via ORAL
  Filled 2022-07-25 (×4): qty 1

## 2022-07-25 MED ORDER — BISACODYL 10 MG RE SUPP
10.0000 mg | Freq: Once | RECTAL | Status: AC
  Administered 2022-07-25: 10 mg via RECTAL
  Filled 2022-07-25: qty 1

## 2022-07-25 MED ORDER — SENNOSIDES-DOCUSATE SODIUM 8.6-50 MG PO TABS
2.0000 | ORAL_TABLET | Freq: Every day | ORAL | Status: AC
  Administered 2022-07-25 – 2022-07-26 (×2): 2 via ORAL
  Filled 2022-07-25 (×2): qty 2

## 2022-07-25 MED ORDER — PANTOPRAZOLE SODIUM 40 MG PO TBEC
40.0000 mg | DELAYED_RELEASE_TABLET | Freq: Every day | ORAL | Status: DC
  Administered 2022-07-25 – 2022-07-28 (×4): 40 mg via ORAL
  Filled 2022-07-25 (×4): qty 1

## 2022-07-25 MED ORDER — IRBESARTAN 150 MG PO TABS
150.0000 mg | ORAL_TABLET | Freq: Two times a day (BID) | ORAL | Status: DC
  Administered 2022-07-25 – 2022-07-28 (×7): 150 mg via ORAL
  Filled 2022-07-25: qty 2
  Filled 2022-07-25 (×2): qty 1
  Filled 2022-07-25 (×2): qty 2
  Filled 2022-07-25: qty 1
  Filled 2022-07-25: qty 2

## 2022-07-25 MED ORDER — DULOXETINE HCL 60 MG PO CPEP
60.0000 mg | ORAL_CAPSULE | Freq: Two times a day (BID) | ORAL | Status: DC
  Administered 2022-07-25 – 2022-07-28 (×7): 60 mg via ORAL
  Filled 2022-07-25 (×7): qty 1

## 2022-07-25 MED ORDER — ASPIRIN 81 MG PO TBEC
81.0000 mg | DELAYED_RELEASE_TABLET | Freq: Every day | ORAL | Status: DC
  Administered 2022-07-25 – 2022-07-27 (×3): 81 mg via ORAL
  Filled 2022-07-25 (×3): qty 1

## 2022-07-25 MED ORDER — POLYETHYLENE GLYCOL 3350 17 G PO PACK: 17.0000 g | PACK | Freq: Every day | ORAL | Status: DC | PRN

## 2022-07-25 MED ORDER — LABETALOL HCL 200 MG PO TABS
100.0000 mg | ORAL_TABLET | Freq: Two times a day (BID) | ORAL | Status: DC
  Administered 2022-07-25 – 2022-07-28 (×7): 100 mg via ORAL
  Filled 2022-07-25 (×7): qty 1

## 2022-07-25 NOTE — TOC Progression Note (Signed)
Transition of Care [facility]) - Progression Note    Patient Details  Name: Rhonda Blackwell MRN: [id number] Date of Birth: 06-Oct-1950  Transition of Care [facility]) CM/SW Contact  Tom-Johnson, Renea Ee, RN Phone Number: 07/25/2022, 2:17 PM  Clinical Narrative:     CM spoke with patient at bedside about Home health recommendations. Patient states she has no preference. CM called in referral to [facility] noted acceptance, info on AVS.  CM will continue to follow as patient progresses with care towards discharge.      Expected Discharge Plan: [place] Barriers to Discharge: Continued Medical Work up  Expected Discharge Plan and Services   Discharge Planning Services: CM Consult Post Acute Care Choice: [place] arrangements for the past 2 months: Single Family Home                 DME Arranged: N/A DME Agency: NA       HH Arranged: PT, OT, RN, Disease Management [place] Agency: [place] Date [place]: 07/25/22 Time [place]: 1414 Representative spoke with at [place]: [place] Determinants of Health ([place]) Interventions SDOH Screenings   Food Insecurity: No Food Insecurity (07/24/2022)  Housing: Low Risk  (07/24/2022)  Transportation Needs: No Transportation Needs (07/24/2022)  Utilities: Not At Risk (07/24/2022)  Tobacco Use: High Risk (07/24/2022)    Readmission Risk Interventions     No data to display

## 2022-07-25 NOTE — Evaluation (Signed)
Physical Therapy Evaluation Patient Details Name: Rhonda Blackwell MRN: [id number] DOB: 01-Nov-1950 Today's Date: 07/25/2022  History of Present Illness  72 y.o. female admitted 2/4 with mild sepsis, community acquired pneumonia, and RUQ pain. 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  Clinical Impression  Pt admitted with above diagnosis. At baseline pt uses SPC to walk, denies falls; husband helps minimally with dressing lower body at times. Today pt requires min assist for bed mobility, transfer, and short distance gait in room. Tolerated exercise well. Sitting upright in recliner having lunch with family present. Pt states husband available to assist 24/7 at d/c and is agreeable to HHPT. Pt currently with functional limitations due to the deficits listed below (see PT Problem List). Pt will benefit from skilled PT to increase their independence and safety with mobility to allow discharge to the venue listed below.      At rest on 2L supplemental O2 - SpO2 87%, on 3L 92%.  Ambulating on 3L 88%, ambulating on 4L 92%.  (Very short distance tolerated in room today.)     Recommendations for follow up therapy are one component of a multi-disciplinary discharge planning process, led by the attending physician.  Recommendations may be updated based on patient status, additional functional criteria and insurance authorization.  Follow Up Recommendations Home health PT      Assistance Recommended at Discharge Intermittent Supervision/Assistance  Patient can return home with the following  A little help with walking and/or transfers;A little help with bathing/dressing/bathroom;Assistance with cooking/housework;Help with stairs or ramp for entrance;Assist for transportation    Equipment Recommendations None recommended by PT  Recommendations for Other Services       Functional Status Assessment Patient has had a  recent decline in their functional status and demonstrates the ability to make significant improvements in function in a reasonable and predictable amount of time.     Precautions / Restrictions Precautions Precautions: Fall Precaution Comments: Monitor O2 Restrictions Weight Bearing Restrictions: No      Mobility  Bed Mobility Overal bed mobility: Needs Assistance Bed Mobility: Supine to Sit     Supine to sit: Min assist, HOB elevated     General bed mobility comments: Cues for technique, required extra time, min assist to rise to EOB for trunk support and a little help to scoot to edge.    Transfers Overall transfer level: Needs assistance Equipment used: Rolling walker (2 wheels) Transfers: Sit to/from Stand Sit to Stand: Min assist           General transfer comment: Min assist for boost to stand, slow rise, cues for hand placement, slight posterior instability upon standing. Facilitated forward lean onto RW.    Ambulation/Gait Ambulation/Gait assistance: Min assist Gait Distance (Feet): 20 Feet Assistive device: Rolling walker (2 wheels) Gait Pattern/deviations: Step-through pattern, Decreased stride length, Shuffle Gait velocity: slow Gait velocity interpretation: <1.31 ft/sec, indicative of household ambulator   General Gait Details: Educated on AD use with RW. Cues for RW placement, upright posture and symmetry of gait. Shuffling and stopping intermittently due to cough. 2/4 dyspnea. (see general comments for sats.) Min assist for RW control and to facilitate continuous steps. No overt buckling.  Stairs            Wheelchair Mobility    Modified Rankin (Stroke Patients Only)       Balance Overall balance assessment: Needs assistance Sitting-balance support: No upper extremity supported, Feet  supported Sitting balance-Leahy Scale: Fair     Standing balance support: Single extremity supported, During functional activity Standing balance-Leahy  Scale: Poor Standing balance comment: Progressed from bil to single UE support.                             Pertinent Vitals/Pain Pain Assessment Pain Assessment: Faces Faces Pain Scale: Hurts even more Pain Location: RUQ Pain Descriptors / Indicators: Aching Pain Intervention(s): Monitored during session, Repositioned    Home Living Family/patient expects to be discharged to:: Private residence Living Arrangements: Spouse/significant other Available Help at Discharge: Family;Available 24 hours/day Type of Home: House Home Access: Stairs to enter Entrance Stairs-Rails: None Entrance Stairs-Number of Steps: 1   Home Layout: One level Home Equipment: Advice worker (2 wheels);Cane - single point;Rollator (4 wheels)      Prior Function Prior Level of Function : Needs assist             Mobility Comments: Denies falls, uses SPC for past 3 years. ADLs Comments: Reports husband assisted with dressing occasionally, mostly socks and coat. Able to get in/out of bath tub, uses shower chair.     Hand Dominance   Dominant Hand: Right    Extremity/Trunk Assessment   Upper Extremity Assessment Upper Extremity Assessment: Defer to OT evaluation    Lower Extremity Assessment Lower Extremity Assessment: Generalized weakness       Communication      Cognition Arousal/Alertness: Awake/alert Behavior During Therapy: WFL for tasks assessed/performed Overall Cognitive Status: Within Functional Limits for tasks assessed                                          General Comments General comments (skin integrity, edema, etc.): At rest on 2L SpO2 87%, on 3L 92%. Ambulating on 3L 88%, ambulating on 4L 92%.    Exercises General Exercises - Lower Extremity Ankle Circles/Pumps: Strengthening, Both, 10 reps, Seated Quad Sets: Strengthening, Both, 10 reps, Seated Gluteal Sets: Strengthening, Both, 10 reps, Seated   Assessment/Plan    PT  Assessment Patient needs continued PT services  PT Problem List Decreased strength;Decreased activity tolerance;Decreased balance;Decreased mobility;Decreased knowledge of use of DME;Cardiopulmonary status limiting activity;Pain       PT Treatment Interventions DME instruction;Gait training;Stair training;Therapeutic activities;Functional mobility training;Therapeutic exercise;Balance training;Neuromuscular re-education;Patient/family education    PT Goals (Current goals can be found in the Care Plan section)  Acute Rehab PT Goals Patient Stated Goal: Go home, get a little therapy there. Feel better PT Goal Formulation: With patient/family Time For Goal Achievement: 08/08/22 Potential to Achieve Goals: Good    Frequency Min 3X/week     Co-evaluation               AM-PAC PT "6 Clicks" Mobility  Outcome Measure Help needed turning from your back to your side while in a flat bed without using bedrails?: A Little Help needed moving from lying on your back to sitting on the side of a flat bed without using bedrails?: A Little Help needed moving to and from a bed to a chair (including a wheelchair)?: A Little Help needed standing up from a chair using your arms (e.g., wheelchair or bedside chair)?: A Little Help needed to walk in hospital room?: A Little Help needed climbing 3-5 steps with a railing? : A Little 6 Click Score:  18    End of Session Equipment Utilized During Treatment: Gait belt;Oxygen Activity Tolerance: Patient limited by fatigue Patient left: in chair;with call bell/phone within reach;with chair alarm set;with family/visitor present Nurse Communication: Mobility status (sats) PT Visit Diagnosis: Unsteadiness on feet (R26.81);Other abnormalities of gait and mobility (R26.89);Muscle weakness (generalized) (M62.81);Difficulty in walking, not elsewhere classified (R26.2);Pain Pain - part of body:  (RUQ)    Time: 5831-6742 PT Time Calculation (min) (ACUTE ONLY): 35  min   Charges:   PT Evaluation $PT Eval Low Complexity: 1 Low PT Treatments $Therapeutic Activity: 8-22 mins        Candie Mile, PT, DPT Physical Therapist Acute [facility] [place]   Ellouise Newer 07/25/2022, 1:55 PM

## 2022-07-25 NOTE — Progress Notes (Signed)
PROGRESS NOTE  Rhonda Blackwell QHU:[id number] DOB: 08-Sep-1950 DOA: 07/23/2022 PCP: [name], MD  HPI/Recap of past 24 hours: Rhonda Blackwell is a 72 y.o. 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  Associated with wheezing and dyspnea.  She denies any fever or chills.  No nausea or vomiting or abdominal pain.    CT of the abdomen and pelvis showed the following: 1. No CT evidence of acute abdominal/pelvic process. 2. Multiple punctate bilateral renal calculi without evidence of hydronephrosis or ureteral calculus. 3. Normal appendix. No evidence of colitis or diverticulitis. 4. 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 5. 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 6. Aortic atherosclerosis.   Portable chest x-ray showed the following: Focal airspace disease in the left upper lobe with interstitial prominence at the lung bases bilaterally, suspicious for pneumonia.   Started on Rocephin and azithromycin empirically.  07/25/2022: The patient was seen and examined at bedside.  Complaints of right upper quadrant abdominal pain.  Review of CT scan done on admission which showed no acute abnormalities involving the liver.  Postcholecystectomy.  No common bile duct dilatation.  Denies nausea and tolerating a diet well.  Endorses constipation for 4 days.  Bowel regimen ordered.  Assessment/Plan: Principal Problem:   Sepsis due to pneumonia [facility]) Active Problems:   Acute respiratory failure with hypoxia ([place])   Type 2 diabetes mellitus without complications ([place])   Essential hypertension   Dyslipidemia  Mild sepsis due to  community-acquired multifocal pneumonia Tachycardic, tachypneic, community-acquired pneumonia seen on chest x-ray. Started on Rocephin IV azithromycin empirically, continue Leukocytosis is improving, WBC is downtrending.  Acute hypoxic respiratory failure secondary to above Not on oxygen supplementation at baseline Wean oxygen supplementation as tolerated Maintain O2 saturation greater than 92%.  Right upper quadrant abdominal pain, unclear etiology CT abdomen and pelvis unrevealing. Analgesics as needed Endorses constipation for the past 4 days Bowel regimen.  Essential hypertension Continue home oral antihypertensive Continue to monitor vital signs  Type 2 diabetes, well-controlled Hemoglobin A1c 6.3 on 07/24/2022 Continue insulin sliding scale.  Physical debility PT OT assessment Fall precautions.  Chronic constipation No bowel movement in 4 days Bowel regimen  Carcinoma, metastatic to the bone with unknown primary. Fall precautions. Close follow-up with her medical oncologist outpatient.  History of CVA Resume home aspirin, Lipitor  Chronic anxiety/depression Resume home Cymbalta.     Code Status: Full code  Family Communication: None at bedside  Disposition Plan: Likely will discharge to home within the next 24 to 48 hours.  Continue IV antibiotics.  Right upper quadrant abdominal pain, treat constipation.   Consultants: None.  Procedures: None.  None.  Antimicrobials: Rocephin Azithromycin  DVT prophylaxis: Subcu Lovenox daily  Status is: Inpatient The patient requires at least 2 midnights for further evaluation and treatment of present condition.    Objective: Vitals:   07/24/22 1457 07/24/22 2206 07/25/22 0504 07/25/22 0930  BP: (!) 149/81 (!) 174/85 (!) 174/72 (!) 168/74  Pulse: 89 76 86 88  Resp: '16 20 18 18  '$ Temp: 98.6 F (37 C) 98.4 F (36.9 C) 99.7 F (37.6 C) 98.8 F (37.1 C)  TempSrc: Oral Oral Oral Oral  SpO2: 92% 93% 92%  94%  Weight:      Height:        Intake/Output Summary (Last 24 hours) at 07/25/2022 1115 Last data filed at 07/25/2022 0900 Gross per 24 hour  Intake 1355.93 ml  Output 4025 ml  Net -2669.07 ml   Filed Weights   07/23/22 1858 07/24/22 0300  Weight: 77 kg 69.6 kg    Exam:  General: 72 y.o. year-old female well developed well nourished in no acute distress.  Alert and oriented x3. Cardiovascular: Regular rate and rhythm with no rubs or gallops.  No thyromegaly or JVD noted.   Respiratory: Clear to auscultation with no wheezes or rales. Good inspiratory effort. Abdomen: Soft, bowel sounds present.  Right upper quadrant tenderness with palpation.   Musculoskeletal: No lower extremity edema. 2/4 pulses in all 4 extremities. Skin: No ulcerative lesions noted or rashes, Psychiatry: Mood is appropriate for condition and setting   Data Reviewed: CBC: Recent Labs  Lab 07/23/22 2032 07/23/22 2039 07/24/22 0243 07/25/22 0726  WBC 13.0*  --  11.4* 10.6*  NEUTROABS 9.7*  --   --   --   HGB 12.2 13.3 11.8* 12.0  HCT 38.0 39.0 37.3 37.8  MCV 80.0  --  80.6 80.4  PLT 426*  --  411* 469   Basic Metabolic Panel: Recent Labs  Lab 07/23/22 2032 07/23/22 2039 07/24/22 0243 07/25/22 0726  NA 133* 136 136 134*  K 3.4* 3.4* 3.1* 3.9  CL 95* 95* 98 102  CO2 25  --  28 26  GLUCOSE 103* 102* 107* 119*  BUN '10 10 8 '$ 6*  CREATININE 0.90 0.90 0.72 0.63  CALCIUM 8.8*  --  8.7* 8.9  MG  --   --   --  1.8  PHOS  --   --   --  3.6   GFR: Estimated Creatinine Clearance: 56.9 mL/min (by C-G formula based on SCr of 0.63 mg/dL). Liver Function Tests: Recent Labs  Lab 07/23/22 2032  AST 20  ALT 15  ALKPHOS 62  BILITOT 0.7  PROT 6.8  ALBUMIN 3.5   Recent Labs  Lab 07/23/22 2032  LIPASE 38   No results for input(s): "AMMONIA" in the last 168 hours. Coagulation Profile: Recent Labs  Lab 07/24/22 0045 07/24/22 0243  INR 1.1 1.0   Cardiac Enzymes: No results for input(s):  "CKTOTAL", "CKMB", "CKMBINDEX", "TROPONINI" in the last 168 hours. BNP (last 3 results) No results for input(s): "PROBNP" in the last 8760 hours. HbA1C: Recent Labs    07/24/22 0045  HGBA1C 6.3*   CBG: Recent Labs  Lab 07/24/22 0735 07/24/22 1117 07/24/22 1525 07/24/22 2333 07/25/22 0715  GLUCAP 129* 103* 124* 117* 121*   Lipid Profile: No results for input(s): "CHOL", "HDL", "LDLCALC", "TRIG", "CHOLHDL", "LDLDIRECT" in the last 72 hours. Thyroid Function Tests: No results for input(s): "TSH", "T4TOTAL", "FREET4", "T3FREE", "THYROIDAB" in the last 72 hours. Anemia Panel: No results for input(s): "VITAMINB12", "FOLATE", "FERRITIN", "TIBC", "IRON", "RETICCTPCT" in the last 72 hours. Urine analysis:    Component Value Date/Time   COLORURINE YELLOW 07/23/2022 2240   APPEARANCEUR HAZY (A) 07/23/2022 2240   LABSPEC 1.015 07/23/2022 2240   PHURINE 6.0 07/23/2022 2240   GLUCOSEU NEGATIVE 07/23/2022 2240   HGBUR NEGATIVE 07/23/2022 2240   BILIRUBINUR NEGATIVE 07/23/2022 2240   KETONESUR NEGATIVE 07/23/2022 2240   PROTEINUR NEGATIVE 07/23/2022 2240   UROBILINOGEN 0.2 02/22/2012 1352   NITRITE NEGATIVE 07/23/2022 2240  LEUKOCYTESUR SMALL (A) 07/23/2022 2240   Sepsis Labs: '@LABRCNTIP'$ (procalcitonin:4,lacticidven:4)  ) Recent Results (from the past 240 hour(s))  Resp panel by RT-PCR (RSV, Flu A&B, Covid) Anterior Nasal Swab     Status: None   Collection Time: 07/23/22  8:32 PM   Specimen: Anterior Nasal Swab  Result Value Ref Range Status   SARS Coronavirus 2 by RT PCR NEGATIVE NEGATIVE Final   Influenza A by PCR NEGATIVE NEGATIVE Final   Influenza B by PCR NEGATIVE NEGATIVE Final    Comment: (NOTE) The Xpert Xpress SARS-CoV-2/FLU/RSV plus assay is intended as an aid in the diagnosis of influenza from Nasopharyngeal swab specimens and should not be used as a sole basis for treatment. Nasal washings and aspirates are unacceptable for Xpert Xpress  SARS-CoV-2/FLU/RSV testing.  Fact Sheet for Patients: EntrepreneurPulse.com.au  Fact Sheet for Healthcare Providers: IncredibleEmployment.be  This test is not yet approved or cleared by the [place] FDA and has been authorized for detection and/or diagnosis of SARS-CoV-2 by FDA under an Emergency Use Authorization (EUA). This EUA will remain in effect (meaning this test can be used) for the duration of the COVID-19 declaration under Section 564(b)(1) of the Act, 21 U.S.C. section 360bbb-3(b)(1), unless the authorization is terminated or revoked.     Resp Syncytial Virus by PCR NEGATIVE NEGATIVE Final    Comment: (NOTE) Fact Sheet for Patients: EntrepreneurPulse.com.au  Fact Sheet for Healthcare Providers: IncredibleEmployment.be  This test is not yet approved or cleared by the [place] FDA and has been authorized for detection and/or diagnosis of SARS-CoV-2 by FDA under an Emergency Use Authorization (EUA). This EUA will remain in effect (meaning this test can be used) for the duration of the COVID-19 declaration under Section 564(b)(1) of the Act, 21 U.S.C. section 360bbb-3(b)(1), unless the authorization is terminated or revoked.  Performed at [facility], [place] [address], [place], [place] [address]   Blood culture (routine x 2)     Status: None (Preliminary result)   Collection Time: 07/23/22 11:00 PM   Specimen: BLOOD  Result Value Ref Range Status   Specimen Description BLOOD LEFT ANTECUBITAL  Final   Special Requests   Final    BOTTLES DRAWN AEROBIC AND ANAEROBIC Blood Culture results may not be optimal due to an excessive volume of blood received in culture bottles   Culture   Final    NO GROWTH 2 DAYS Performed at [facility], [place] [address], [place], [place] [address]    Report Status PENDING  Incomplete  Blood culture (routine x 2)     Status: None (Preliminary  result)   Collection Time: 07/23/22 11:13 PM   Specimen: BLOOD LEFT FOREARM  Result Value Ref Range Status   Specimen Description BLOOD LEFT FOREARM  Final   Special Requests   Final    BOTTLES DRAWN AEROBIC AND ANAEROBIC Blood Culture adequate volume   Culture   Final    NO GROWTH 2 DAYS Performed at [facility], [place] [address] [place], [place] [address]    Report Status PENDING  Incomplete      Studies: No results found.  Scheduled Meds:  enoxaparin (LOVENOX) injection  40 mg Subcutaneous Q24H   insulin aspart  0-5 Units Subcutaneous QHS   insulin aspart  0-9 Units Subcutaneous TID WC   senna-docusate  1 tablet Oral QHS    Continuous Infusions:  0.9 % NaCl with KCl 20 mEq / L 50 mL/hr at 07/24/22 2159  azithromycin 500 mg (07/24/22 2327)   cefTRIAXone (ROCEPHIN)  IV 2 g (07/24/22 2214)   lactated ringers Stopped (07/24/22 0103)     LOS: 2 days     Rhonda Memos, MD [facility] Pager [phone]  If 7PM-7AM, please contact night-coverage [email or web address] Password [email or web address] 07/25/2022, 11:15 AM

## 2022-07-25 NOTE — Progress Notes (Signed)
SATURATION QUALIFICATIONS:   Patient Saturations on 3 Liters at Rest = 92% Patient Saturations on 3 liters while Ambulating = 88% Patient Saturations on 4 Liters of oxygen while Ambulating = 92%  Please briefly explain why patient needs home oxygen: Patient is 87% while trying to wean her off at from 3L to 2L O2 even at rest.

## 2022-07-26 DIAGNOSIS — A419 Sepsis, unspecified organism: Secondary | ICD-10-CM | POA: Diagnosis not present

## 2022-07-26 DIAGNOSIS — J189 Pneumonia, unspecified organism: Secondary | ICD-10-CM | POA: Diagnosis not present

## 2022-07-26 LAB — COMPREHENSIVE METABOLIC PANEL
ALT: 18 U/L (ref 0–44)
AST: 26 U/L (ref 15–41)
Albumin: 2.9 g/dL — ABNORMAL LOW (ref 3.5–5.0)
Alkaline Phosphatase: 67 U/L (ref 38–126)
Anion gap: 9 (ref 5–15)
BUN: 8 mg/dL (ref 8–23)
CO2: 24 mmol/L (ref 22–32)
Calcium: 8.8 mg/dL — ABNORMAL LOW (ref 8.9–10.3)
Chloride: 99 mmol/L (ref 98–111)
Creatinine, Ser: 0.6 mg/dL (ref 0.44–1.00)
GFR, Estimated: >60 mL/min (ref >60)
Glucose, Bld: 130 mg/dL — ABNORMAL HIGH (ref 70–99)
Potassium: 3.9 mmol/L (ref 3.5–5.1)
Sodium: 132 mmol/L — ABNORMAL LOW (ref 135–145)
Total Bilirubin: 0.8 mg/dL (ref 0.3–1.2)
Total Protein: 6.1 g/dL — ABNORMAL LOW (ref 6.5–8.1)

## 2022-07-26 LAB — CBC
HCT: 34.9 % — ABNORMAL LOW (ref 36.0–46.0)
Hemoglobin: 11.4 g/dL — ABNORMAL LOW (ref 12.0–15.0)
MCH: 25.6 pg — ABNORMAL LOW (ref 26.0–34.0)
MCHC: 32.7 g/dL (ref 30.0–36.0)
MCV: 78.3 fL — ABNORMAL LOW (ref 80.0–100.0)
Platelets: 373 10*3/uL (ref 150–400)
RBC: 4.46 MIL/uL (ref 3.87–5.11)
RDW: 14 % (ref 11.5–15.5)
WBC: 11.5 10*3/uL — ABNORMAL HIGH (ref 4.0–10.5)
nRBC: 0 % (ref 0.0–0.2)

## 2022-07-26 LAB — GLUCOSE, CAPILLARY
Glucose-Capillary: 123 mg/dL — ABNORMAL HIGH (ref 70–99)
Glucose-Capillary: 136 mg/dL — ABNORMAL HIGH (ref 70–99)
Glucose-Capillary: 130 mg/dL — ABNORMAL HIGH (ref 70–99)
Glucose-Capillary: 124 mg/dL — ABNORMAL HIGH (ref 70–99)

## 2022-07-26 MED ORDER — POLYETHYLENE GLYCOL 3350 17 G PO PACK
17.0000 g | PACK | Freq: Two times a day (BID) | ORAL | Status: DC
  Administered 2022-07-26 – 2022-07-28 (×4): 17 g via ORAL
  Filled 2022-07-26 (×5): qty 1

## 2022-07-26 MED ORDER — NICOTINE 14 MG/24HR TD PT24
14.0000 mg | MEDICATED_PATCH | Freq: Every day | TRANSDERMAL | Status: DC
  Administered 2022-07-26 – 2022-07-28 (×3): 14 mg via TRANSDERMAL
  Filled 2022-07-26 (×4): qty 1

## 2022-07-26 MED ORDER — GUAIFENESIN 100 MG/5ML PO LIQD
5.0000 mL | ORAL | Status: DC | PRN
  Administered 2022-07-26: 5 mL via ORAL
  Filled 2022-07-26: qty 15

## 2022-07-26 NOTE — Plan of Care (Signed)
  Problem: Fluid Volume: Goal: Hemodynamic stability will improve Outcome: Progressing   

## 2022-07-26 NOTE — Hospital Course (Signed)
72 y.o. 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  Associated with wheezing and dyspnea.  She denies any fever or chills.  No nausea or vomiting or abdominal pain

## 2022-07-26 NOTE — Progress Notes (Addendum)
  Progress Note   Patient: Rhonda Blackwell ZSW:[id number] DOB: [birth year] DOA: 07/23/2022     3 DOS: the patient was seen and examined on 07/26/2022   Brief hospital course: 72 y.o. 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  Associated with wheezing and dyspnea.  She denies any fever or chills.  No nausea or vomiting or abdominal pain   Assessment and Plan: Mild sepsis due to community-acquired multifocal pneumonia Tachycardic, tachypneic, community-acquired pneumonia seen on chest x-ray. Started on Rocephin IV azithromycin empirically Leukocytosis had improved from admit -remains 3L O2 dependent, up to 4L on exertion. Pt is O2 naive -Continue to wean O2 as tolerated -Continue with flutter valve, mucinex   Right upper quadrant abdominal pain, unclear etiology CT abdomen and pelvis noted to be unrevealing. Analgesics as needed Endorses constipation for the past 4 days. Still no BM recorded Will schedule miralax bid   Essential hypertension Continue home oral antihypertensive Continue to monitor vital signs   Type 2 diabetes, well-controlled Hemoglobin A1c 6.3 on 07/24/2022 Continue insulin sliding scale.   Physical debility PT OT assessment Fall precautions.   Chronic constipation No bowel movement in 4 days Bowel regimen   Carcinoma, metastatic to the bone with unknown primary. Fall precautions. Close follow-up with her medical oncologist outpatient.   History of CVA Resume home aspirin, Lipitor   Chronic anxiety/depression Resume home Cymbalta.   Hyponatremia -Recheck bmet in AM    Subjective: Complaining of pain in the upper abd. Still no recorded BM  yet  Physical Exam: Vitals:   07/25/22 1637 07/25/22 2140 07/26/22 0618 07/26/22 1013  BP: (!) 140/66 (!) 156/83 (!) 148/72 (!) 153/79  Pulse: 85 85 83 86  Resp: '18 20 20 19  '$ Temp: 98.2 F (36.8 C) 98.3 F (36.8 C) 98.5 F (36.9 C) 98 F (36.7 C)  TempSrc:   Oral   SpO2: 93% 91% 98% 99%  Weight:      Height:       General exam: Awake, laying in bed, in nad Respiratory system: Normal respiratory effort, no wheezing Cardiovascular system: regular rate, s1, s2 Gastrointestinal system: Soft, nondistended, positive BS Central nervous system: CN2-12 grossly intact, strength intact Extremities: Perfused, no clubbing Skin: Normal skin turgor, no notable skin lesions seen Psychiatry: Mood normal // no visual hallucinations   Data Reviewed:  Labs reviewed: Na 132, K 3.9, Cr 0.60, WBC 11.5, Hgb 11.4  Family Communication: Pt in room, family not at bedside  Disposition: Status is: Inpatient Remains inpatient appropriate because: Severity of illness  Planned Discharge Destination: Home with Home Health   Author: Marylu Lund, MD 07/26/2022 4:34 PM  For on call review [email or web address].

## 2022-07-26 NOTE — Evaluation (Signed)
Occupational Therapy Evaluation Patient Details Name: Rhonda Blackwell MRN: [id number] DOB: [birth year] Today's Date: 07/26/2022   History of Present Illness 72 y.o. female admitted 2/4 with mild sepsis, community acquired pneumonia, and RUQ pain. 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   Clinical Impression   Pt admitted with the above diagnosis and has the deficits listed below. Pt would benefit from cont OT to increase independence with basic adls so she can d/c back home with her husband. Pt was walking with cane or walker (mostly outside home) PTA and could do most adls but needed help at times with socks and shoes as well as cooking and cleaning. Pt is very SOB during treatment with 4L O2 and at 93% O2 sat at rest.  Pt fatigues very quickly and could only tolerate a few steps to the chair. Pt will need to increase activity tolerance while here to be able to d/c home with husband and HHOT.       Recommendations for follow up therapy are one component of a multi-disciplinary discharge planning process, led by the attending physician.  Recommendations may be updated based on patient status, additional functional criteria and insurance authorization.   Follow Up Recommendations  Home health OT     Assistance Recommended at Discharge Frequent or constant Supervision/Assistance  Patient can return home with the following A little help with walking and/or transfers;A lot of help with bathing/dressing/bathroom;Assistance with cooking/housework;Assist for transportation;Help with stairs or ramp for entrance    Functional Status Assessment  Patient has had a recent decline in their functional status and demonstrates the ability to make significant improvements in function in a reasonable and predictable amount of time.  Equipment Recommendations  None recommended by OT    Recommendations for Other Services        Precautions / Restrictions Precautions Precautions: Fall Precaution Comments: Monitor O2 Restrictions Weight Bearing Restrictions: No      Mobility Bed Mobility Overal bed mobility: Needs Assistance Bed Mobility: Supine to Sit     Supine to sit: Min assist, HOB elevated          Transfers Overall transfer level: Needs assistance Equipment used: Rolling walker (2 wheels) Transfers: Sit to/from Stand Sit to Stand: Mod assist           General transfer comment: mod assist just to get started into standing and min assist to get to full stand. Cues for hand placement.      Balance Overall balance assessment: Needs assistance Sitting-balance support: No upper extremity supported, Feet supported Sitting balance-Leahy Scale: Fair     Standing balance support: Bilateral upper extremity supported, Reliant on assistive device for balance, During functional activity Standing balance-Leahy Scale: Poor Standing balance comment: used only walker during this session                           ADL either performed or assessed with clinical judgement   ADL Overall ADL's : Needs assistance/impaired Eating/Feeding: Independent;Sitting   Grooming: Wash/dry hands;Wash/dry face;Oral care;Set up;Sitting Grooming Details (indicate cue type and reason): pt too sob to walk to sink Upper Body Bathing: Set up;Sitting   Lower Body Bathing: Moderate assistance;Sit to/from stand;Cueing for compensatory techniques   Upper Body Dressing : Minimal assistance;Sitting;Cueing for compensatory techniques   Lower Body Dressing: Moderate assistance;Sit to/from stand;Cueing for compensatory techniques   Toilet Transfer: Moderate assistance;Stand-pivot;BSC/3in1;Rolling walker (2  wheels) Toilet Transfer Details (indicate cue type and reason): mod assist to power up and then min assist once standing Toileting- Clothing Manipulation and Hygiene: Minimal assistance;Sitting/lateral  lean       Functional mobility during ADLs: Minimal assistance;Rolling walker (2 wheels) General ADL Comments: Pt  limited by RUQ pain when bending forward or moving during adls making LE adls difficult.     Vision Baseline Vision/History: 0 No visual deficits Ability to See in Adequate Light: 0 Adequate Patient Visual Report: No change from baseline Vision Assessment?: No apparent visual deficits     Perception Perception Perception Tested?: No   Praxis Praxis Praxis tested?: Not tested    Pertinent Vitals/Pain Pain Assessment Pain Assessment: Faces Faces Pain Scale: Hurts even more Pain Location: RUQ Pain Descriptors / Indicators: Aching Pain Intervention(s): Limited activity within patient's tolerance, Monitored during session, Repositioned     Hand Dominance Right   Extremity/Trunk Assessment Upper Extremity Assessment Upper Extremity Assessment: Overall WFL for tasks assessed   Lower Extremity Assessment Lower Extremity Assessment: Defer to PT evaluation   Cervical / Trunk Assessment Cervical / Trunk Assessment: Normal   Communication Communication Communication: No difficulties   Cognition Arousal/Alertness: Awake/alert Behavior During Therapy: WFL for tasks assessed/performed Overall Cognitive Status: Within Functional Limits for tasks assessed                                       General Comments  Pt limited mostly by pain and SOB.    Exercises     Shoulder Instructions      Home Living Family/patient expects to be discharged to:: Private residence Living Arrangements: Spouse/significant other Available Help at Discharge: Family;Available 24 hours/day Type of Home: House Home Access: Stairs to enter [employer] of Steps: 1 Entrance Stairs-Rails: None Home Layout: One level     Bathroom Shower/Tub: Corporate investment banker: Standard     Home Equipment: Advice worker (2  wheels);Cane - single point;Rollator (4 wheels)          Prior Functioning/Environment Prior Level of Function : Needs assist       Physical Assist : ADLs (physical)   ADLs (physical): Dressing;IADLs Mobility Comments: Denies falls, uses SPC for past 3 years. ADLs Comments: Reports husband assisted with dressing occasionally, mostly socks and coat. Able to get in/out of bath tub, uses shower chair.        OT Problem List: Decreased activity tolerance;Impaired balance (sitting and/or standing);Decreased knowledge of use of DME or AE;Cardiopulmonary status limiting activity;Pain      OT Treatment/Interventions: Self-care/ADL training;Energy conservation;Therapeutic activities;Balance training    OT Goals(Current goals can be found in the care plan section) Acute Rehab OT Goals Patient Stated Goal: to get better and go home OT Goal Formulation: With patient Time For Goal Achievement: 08/09/22 Potential to Achieve Goals: Good ADL Goals Pt Will Perform Grooming: with modified independence;standing Pt Will Perform Tub/Shower Transfer: Tub transfer;rolling walker;ambulating;shower seat;with supervision Additional ADL Goal #1: Pt will walk to bathroom and complete all toileting with mod I. Additional ADL Goal #2: Pt will state and implement three things during adl routine she can do to save energy with min cues.  OT Frequency: Min 2X/week    Co-evaluation              AM-PAC OT "6 Clicks" Daily Activity     Outcome Measure Help from another person eating  meals?: None Help from another person taking care of personal grooming?: None Help from another person toileting, which includes using toliet, bedpan, or urinal?: A Little Help from another person bathing (including washing, rinsing, drying)?: A Lot Help from another person to put on and taking off regular upper body clothing?: A Little Help from another person to put on and taking off regular lower body clothing?: A Lot 6  Click Score: 18   End of Session Equipment Utilized During Treatment: Rolling walker (2 wheels);Oxygen Nurse Communication: Mobility status  Activity Tolerance: Patient limited by fatigue;No increased pain Patient left: in chair;with call bell/phone within reach;with chair alarm set  OT Visit Diagnosis: Unsteadiness on feet (R26.81);Pain Pain - Right/Left: Right Pain - part of body:  (RUQ)                Time: 1388-7195 OT Time Calculation (min): 19 min Charges:  OT General Charges $OT Visit: 1 Visit OT Evaluation $OT Eval Low Complexity: 1 Low  Glenford Peers 07/26/2022, 8:37 AM

## 2022-07-26 NOTE — Progress Notes (Signed)
Physical Therapy Treatment Patient Details Name: Rhonda Blackwell MRN: [id number] DOB: [birth year] Today's Date: 07/26/2022   History of Present Illness 72 y.o. female admitted 2/4 with mild sepsis, community acquired pneumonia, and RUQ pain. 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    PT Comments    Pt sitting up in recliner on entry, irritated about sitting up for an hour due to numbness and tingling in feet. Pt with c/o of back, knee and foot pain, in addition to RUQ pain. As PT readying pt for ambulation, her daughter calls and asks about nicotine patch as pt smokes a pack of cigarettes a day. RN notified. Pt is limited in safe mobility by increase pain in RUQ, knees, back and feet, in addition to increased O2 demand, 3/4 DoE with short distance ambulation, in presence of generalized weakness, and decreased endurance. D/c recommendations remain appropriate. PT will continue to follow acutely.     Recommendations for follow up therapy are one component of a multi-disciplinary discharge planning process, led by the attending physician.  Recommendations may be updated based on patient status, additional functional criteria and insurance authorization.  Follow Up Recommendations  Home health PT     Assistance Recommended at Discharge Intermittent Supervision/Assistance  Patient can return home with the following A little help with walking and/or transfers;A little help with bathing/dressing/bathroom;Assistance with cooking/housework;Help with stairs or ramp for entrance;Assist for transportation   Equipment Recommendations  None recommended by PT       Precautions / Restrictions Precautions Precautions: Fall Precaution Comments: Monitor O2 Restrictions Weight Bearing Restrictions: No     Mobility  Bed Mobility Overal bed mobility: Needs Assistance Bed Mobility: Sit to Supine     Supine to sit: Min  guard     General bed mobility comments: increased time and effort for bringing LE into bed, cues for bridging hips to move to center of bed    Transfers Overall transfer level: Needs assistance Equipment used: Rolling walker (2 wheels) Transfers: Sit to/from Stand Sit to Stand: Min assist           General transfer comment: min A to use pad to scoot hips to edge of chair, min A for power up and steady    Ambulation/Gait Ambulation/Gait assistance: Min assist Gait Distance (Feet): 20 Feet Assistive device: Rolling walker (2 wheels) Gait Pattern/deviations: Step-through pattern, Decreased stride length, Shuffle Gait velocity: slow Gait velocity interpretation: <1.31 ft/sec, indicative of household ambulator   General Gait Details: pt with very slow ambulation around foot of bed to get back to bed, small shuffling steps, and intermittent short rest breaks. cues for proximity to RW and upright posture. Pt with 3/4 DoE by the time she reached bedside         Balance Overall balance assessment: Needs assistance Sitting-balance support: No upper extremity supported, Feet supported Sitting balance-Leahy Scale: Fair     Standing balance support: During functional activity, Bilateral upper extremity supported Standing balance-Leahy Scale: Poor Standing balance comment: Progressed from bil to single UE support.                            Cognition Arousal/Alertness: Awake/alert Behavior During Therapy: WFL for tasks assessed/performed Overall Cognitive Status: Within Functional Limits for tasks assessed  Exercises Other Exercises Other Exercises: IS x10, max inhale 250 mL    General Comments General comments (skin integrity, edema, etc.): Pt very irritated about being left up in chair, reporting her feet had fallen asleep and that her back, feet and knees hurt. While getting back to bed pt's daughter  calls and asks about a nicotine patch. Pt reports she usually smokes a pack of cigarettes a day and hasn't had any since Sunday. Notified RN that pt might benefit from a nicotine patch. Pt on 3L O2 via [place] on entry with SpO2 90%O2, increased O2 to 4L for ambulation. With sitting on EoB pt SpO2 81%O2. instructed in pursed lip breathing, SpO2 increased to 86%O2 had pt use IS and able to rebound to 90%O2. Pt left on 4L O2 via [place].      Pertinent Vitals/Pain Pain Assessment Pain Assessment: Faces Faces Pain Scale: Hurts even more Pain Location: RUQ Pain Descriptors / Indicators: Aching Pain Intervention(s): Limited activity within patient's tolerance, Monitored during session, Repositioned    Home Living Family/patient expects to be discharged to:: Private residence Living Arrangements: Spouse/significant other Available Help at Discharge: Family;Available 24 hours/day Type of Home: House Home Access: Stairs to enter Entrance Stairs-Rails: None Entrance Stairs-Number of Steps: 1   Home Layout: One level Home Equipment: Advice worker (2 wheels);Cane - single point;Rollator (4 wheels)      Prior Function            PT Goals (current goals can now be found in the care plan section) Acute Rehab PT Goals Patient Stated Goal: Go home, get a little therapy there. Feel better PT Goal Formulation: With patient/family Time For Goal Achievement: 08/08/22 Potential to Achieve Goals: Good Progress towards PT goals: Progressing toward goals    Frequency    Min 3X/week      PT Plan Current plan remains appropriate       AM-PAC PT "6 Clicks" Mobility   Outcome Measure  Help needed turning from your back to your side while in a flat bed without using bedrails?: A Little Help needed moving from lying on your back to sitting on the side of a flat bed without using bedrails?: A Little Help needed moving to and from a bed to a chair (including a wheelchair)?: A Little Help  needed standing up from a chair using your arms (e.g., wheelchair or bedside chair)?: A Little Help needed to walk in hospital room?: A Little Help needed climbing 3-5 steps with a railing? : A Little 6 Click Score: 18    End of Session Equipment Utilized During Treatment: Gait belt;Oxygen Activity Tolerance: Patient limited by fatigue;Other (comment) (DoE) Patient left: with bed alarm set;in bed;with nursing/sitter in room Nurse Communication: Mobility status (sats) PT Visit Diagnosis: Unsteadiness on feet (R26.81);Other abnormalities of gait and mobility (R26.89);Muscle weakness (generalized) (M62.81);Difficulty in walking, not elsewhere classified (R26.2);Pain Pain - part of body:  (RUQ)     Time: 2330-0762 PT Time Calculation (min) (ACUTE ONLY): 29 min  Charges:  $Gait Training: 8-22 mins $Therapeutic Exercise: 8-22 mins                     Telecia Larocque B. Migdalia Dk PT, DPT Acute Rehabilitation Services Please use secure chat or  Call Office [phone]    [place] 07/26/2022, 10:24 AM

## 2022-07-27 ENCOUNTER — Inpatient Hospital Stay (HOSPITAL_COMMUNITY): Payer: Medicare Other

## 2022-07-27 ENCOUNTER — Other Ambulatory Visit (HOSPITAL_COMMUNITY): Payer: Self-pay

## 2022-07-27 DIAGNOSIS — I4891 Unspecified atrial fibrillation: Secondary | ICD-10-CM

## 2022-07-27 DIAGNOSIS — A419 Sepsis, unspecified organism: Secondary | ICD-10-CM | POA: Diagnosis not present

## 2022-07-27 DIAGNOSIS — I480 Paroxysmal atrial fibrillation: Secondary | ICD-10-CM

## 2022-07-27 DIAGNOSIS — J189 Pneumonia, unspecified organism: Secondary | ICD-10-CM | POA: Diagnosis not present

## 2022-07-27 DIAGNOSIS — I10 Essential (primary) hypertension: Secondary | ICD-10-CM | POA: Diagnosis not present

## 2022-07-27 LAB — GLUCOSE, CAPILLARY
Glucose-Capillary: 154 mg/dL — ABNORMAL HIGH (ref 70–99)
Glucose-Capillary: 173 mg/dL — ABNORMAL HIGH (ref 70–99)
Glucose-Capillary: 153 mg/dL — ABNORMAL HIGH (ref 70–99)
Glucose-Capillary: 147 mg/dL — ABNORMAL HIGH (ref 70–99)

## 2022-07-27 LAB — COMPREHENSIVE METABOLIC PANEL
ALT: 27 U/L (ref 0–44)
AST: 26 U/L (ref 15–41)
Albumin: 2.7 g/dL — ABNORMAL LOW (ref 3.5–5.0)
Alkaline Phosphatase: 73 U/L (ref 38–126)
Anion gap: 12 (ref 5–15)
BUN: 12 mg/dL (ref 8–23)
CO2: 23 mmol/L (ref 22–32)
Calcium: 8.7 mg/dL — ABNORMAL LOW (ref 8.9–10.3)
Chloride: 94 mmol/L — ABNORMAL LOW (ref 98–111)
Creatinine, Ser: 0.66 mg/dL (ref 0.44–1.00)
GFR, Estimated: >60 mL/min (ref >60)
Glucose, Bld: 133 mg/dL — ABNORMAL HIGH (ref 70–99)
Potassium: 3.8 mmol/L (ref 3.5–5.1)
Sodium: 129 mmol/L — ABNORMAL LOW (ref 135–145)
Total Bilirubin: 0.8 mg/dL (ref 0.3–1.2)
Total Protein: 6.2 g/dL — ABNORMAL LOW (ref 6.5–8.1)

## 2022-07-27 LAB — SODIUM, URINE, RANDOM: Sodium, Ur: 78 mmol/L

## 2022-07-27 LAB — CBC
HCT: 33.7 % — ABNORMAL LOW (ref 36.0–46.0)
Hemoglobin: 11.3 g/dL — ABNORMAL LOW (ref 12.0–15.0)
MCH: 26.1 pg (ref 26.0–34.0)
MCHC: 33.5 g/dL (ref 30.0–36.0)
MCV: 77.8 fL — ABNORMAL LOW (ref 80.0–100.0)
Platelets: 338 10*3/uL (ref 150–400)
RBC: 4.33 MIL/uL (ref 3.87–5.11)
RDW: 13.8 % (ref 11.5–15.5)
WBC: 11.3 10*3/uL — ABNORMAL HIGH (ref 4.0–10.5)
nRBC: 0 % (ref 0.0–0.2)

## 2022-07-27 LAB — OSMOLALITY, URINE: Osmolality, Ur: 511 mOsm/kg (ref 300–900)

## 2022-07-27 LAB — OSMOLALITY: Osmolality: 281 mOsm/kg (ref 275–295)

## 2022-07-27 LAB — ECHOCARDIOGRAM COMPLETE
Area-P 1/2: 3.21 cm2
Calc EF: 70.5 %
Est EF: >75
Height: 60 in
S' Lateral: 2.5 cm
Single Plane A2C EF: 73.9 %
Single Plane A4C EF: 64.7 %
Weight: 2455.04 oz

## 2022-07-27 LAB — MAGNESIUM: Magnesium: 1.7 mg/dL (ref 1.7–2.4)

## 2022-07-27 LAB — TSH: TSH: 1.374 u[IU]/mL (ref 0.350–4.500)

## 2022-07-27 MED ORDER — DIPHENHYDRAMINE HCL 25 MG PO CAPS: 25.0000 mg | ORAL_CAPSULE | ORAL | Status: DC | PRN

## 2022-07-27 MED ORDER — DILTIAZEM HCL 25 MG/5ML IV SOLN
10.0000 mg | Freq: Once | INTRAVENOUS | Status: AC
  Administered 2022-07-27: 10 mg via INTRAVENOUS
  Filled 2022-07-27: qty 5

## 2022-07-27 MED ORDER — SODIUM CHLORIDE 0.9 % IV BOLUS
1000.0000 mL | Freq: Once | INTRAVENOUS | Status: AC
  Administered 2022-07-27: 1000 mL via INTRAVENOUS

## 2022-07-27 MED ORDER — METOPROLOL TARTRATE 5 MG/5ML IV SOLN: 5.0000 mg | Freq: Once | INTRAVENOUS | Status: DC

## 2022-07-27 MED ORDER — DILTIAZEM LOAD VIA INFUSION
5.0000 mg | Freq: Once | INTRAVENOUS | Status: AC
  Administered 2022-07-27: 5 mg via INTRAVENOUS
  Filled 2022-07-27: qty 5

## 2022-07-27 MED ORDER — APIXABAN 5 MG PO TABS
5.0000 mg | ORAL_TABLET | Freq: Two times a day (BID) | ORAL | Status: DC
  Administered 2022-07-27 – 2022-07-28 (×3): 5 mg via ORAL
  Filled 2022-07-27 (×3): qty 1

## 2022-07-27 MED ORDER — METOPROLOL TARTRATE 5 MG/5ML IV SOLN
5.0000 mg | Freq: Once | INTRAVENOUS | Status: AC
  Administered 2022-07-27: 5 mg via INTRAVENOUS
  Filled 2022-07-27: qty 5

## 2022-07-27 MED ORDER — DILTIAZEM LOAD VIA INFUSION: 10.0000 mg | Freq: Once | INTRAVENOUS | Status: DC

## 2022-07-27 MED ORDER — IPRATROPIUM-ALBUTEROL 0.5-2.5 (3) MG/3ML IN SOLN: 3.0000 mL | RESPIRATORY_TRACT | Status: DC | PRN

## 2022-07-27 MED ORDER — DILTIAZEM HCL-DEXTROSE 125-5 MG/125ML-% IV SOLN (PREMIX): 5.0000 mg/h | INTRAVENOUS | Status: DC

## 2022-07-27 MED ORDER — DILTIAZEM HCL-DEXTROSE 125-5 MG/125ML-% IV SOLN (PREMIX)
5.0000 mg/h | INTRAVENOUS | Status: DC
  Administered 2022-07-27: 10 mg/h via INTRAVENOUS
  Filled 2022-07-27: qty 125

## 2022-07-27 MED ORDER — PHENOL 1.4 % MT LIQD
1.0000 | OROMUCOSAL | Status: DC | PRN
  Administered 2022-07-27: 1 via OROMUCOSAL
  Filled 2022-07-27: qty 177

## 2022-07-27 NOTE — Progress Notes (Signed)
  Progress Note   Patient: Rhonda Blackwell FYB:[id number] DOB: [birth year] DOA: 07/23/2022     4 DOS: the patient was seen and examined on 07/27/2022   Brief hospital course: 72 y.o. 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  Associated with wheezing and dyspnea.  She denies any fever or chills.  No nausea or vomiting or abdominal pain   Assessment and Plan: Sepsis due to community-acquired multifocal pneumonia, present on admission Tachycardic, tachypneic, community-acquired pneumonia seen on chest x-ray. Started on Rocephin IV azithromycin empirically Leukocytosis had improved from admit -now on 4LNC. Ordered and reviewed CXR. Emphysema with increased atelectasis  -Encourage IS and flutter valve -Continue to wean O2 as tolerated   Right upper quadrant abdominal pain, unclear etiology CT abdomen and pelvis noted to be unrevealing. Analgesics as needed Endorses constipation for the past 4 days. Still no BM recorded Currently on scheduled bid miralax   Essential hypertension Continue home oral antihypertensive Continue to monitor vital signs   Type 2 diabetes, well-controlled Hemoglobin A1c 6.3 on 07/24/2022 Continue insulin sliding scale.   Physical debility PT OT assessment Fall precautions.   Chronic constipation Still no bowel movement Cont bowel regimen per above May need mg citrate   Carcinoma, metastatic to the bone with unknown primary. Fall precautions. Close follow-up with her medical oncologist outpatient.   History of CVA Had been on home aspirin, Lipitor -With addition of eliquis, will hold ASA   Chronic anxiety/depression Resume home Cymbalta.    Hyponatremia -lower at 129 this AM. Pt appears euvolemic on exam -Ordered serum osm, urine osm, urine Na  Afib RVR, new diagnosis -New event overnight -required cardizem gtt and transfer to progressive unit -Cardiology consulted -Pt weaned off cardizem gtt, on labetalol '100mg'$  bid -2d echo reviewed, hyperdynamic LV function -Now on eliquis, will hold ASA     Subjective: Reports feeling generally better this AM  Physical Exam: Vitals:   07/27/22 0909 07/27/22 1114 07/27/22 1152 07/27/22 1253  BP:   128/71   Pulse: 83  73   Resp: 17  20   Temp: 98.5 F (36.9 C)  99.6 F (37.6 C)   TempSrc: Oral  Oral   SpO2:  92% 93% 90%  Weight:      Height:       General exam: Conversant, in no acute distress Respiratory system: normal chest rise, clear, no audible wheezing Cardiovascular system: regular rhythm, s1-s2 Gastrointestinal system: Nondistended, nontender, pos BS Central nervous system: No seizures, no tremors Extremities: No cyanosis, no joint deformities Skin: No rashes, no pallor Psychiatry: Affect normal // no auditory hallucinations   Data Reviewed:  Labs reviewed: Na 129, K 3.8, Cr 0.66, WBC 11.3, Hgb 11.3  Family Communication: Pt in room, family not at bedside  Disposition: Status is: Inpatient Remains inpatient appropriate because: Severity of illness  Planned Discharge Destination: Home with Home Health   Author: Marylu Lund, MD 07/27/2022 2:58 PM  For on call review [email or web address].

## 2022-07-27 NOTE — Progress Notes (Signed)
  Echocardiogram 2D Echocardiogram has been performed.  Rhonda Blackwell 07/27/2022, 11:19 AM

## 2022-07-27 NOTE — TOC Benefit Eligibility Note (Signed)
Patient Teacher, English as a foreign language completed.    The patient is currently admitted and upon discharge could be taking Eliquis 5 mg.  The current 30 day co-pay is $43.00.   The patient is insured through [employer] ([facility] is the only [facility] pharmacy that takes [employer])   Lyndel Safe, [place] Patient [place] Patient Advocate Team Direct Number: [phone]  Fax: [phone]

## 2022-07-27 NOTE — Progress Notes (Signed)
Patient was transferred to 4E20 with her belongings.

## 2022-07-27 NOTE — Progress Notes (Signed)
MD on call at bedside. Patient HR remains elevated with Diltiazem 10 mg push. New order received for Diltiazem IV with titration. Patient is for transfer to higher level of care.

## 2022-07-27 NOTE — Consult Note (Signed)
Cardiology Consultation   Patient ID: Rhonda Blackwell MRN: [id number]; DOB: [birth year]  Admit date: 07/23/2022 Date of Consult: 07/27/2022  PCP:  [name], MD   [facility] Cardiologist: New to Dr Radford Pax, wishes to follow up with [facility] cardiology    Patient Profile:   Rhonda Blackwell is a 72 y.o. female with a hx of COPD, type II DM, CVA, HTN,  hypermetabolic lung nodule concerning for primary bronchometastatic malignancy, who is being seen 07/27/2022 for the evaluation of A fib at the request of Dr Wyline Copas.  History of Present Illness:   [name] with above PMH who presented to ER on 07/23/22 for acute on chronic back pain, chest wall pain, productive cough, and abdominal pain.  She was admitted for sepsis due to multifocal pneumonia and acute hypoxic respiratory failure requiring nasal cannula oxygen support.   Chest x-ray at admission showed focal airspace disease in the left upper lobe with interstitial prominence at lung bases bilaterally with suspicion for pneumonia. CTAP with contrast was performed on 2/4 for acute abdominal pain which revealed no evidence of acute abdominal/pelvic etiology.  There was multiple punctate bilateral renal calculi without evidence of hydronephrosis.  She has been treated with IV antibiotic. Diagnostic workup today revealed acute onset of hyponatremia 129, hypoalbuminemia 2.7.  Initial lactic acid and procalcitonin were WNL at admission.  She has persistent mild leukocytosis with WBC 11300 today.  TSH WNL.  On 07/27/2022 early morning, patient was noted in A-fib RVR with ventricular heart rate at 150-160s.  She was reportedly asymptomatic with stable blood pressure.  She was given IV metoprolol and diltiazem was minimal improvement, and subsequently started on diltiazem drip.  She was initiated on Eliquis for CHA2DS2-VASc score of 6.  Echocardiogram has been ordered.  Cardiology consult is requested today.  Patient states she has  been coughing and has right sided chest pain when she coughs. She felts her legs are numb. She denied having chest pain, SOB, dizziness, heart palpitation, syncope when she had A fib RVR early morning. She denied hx of MI, CHF, arrhythmia. She states she sees a cardiologist at [facility] who adjust her medications.   She follows cardiology at [facility], sees Cash. Myer Haff PA last on 03/29/22, on  apro 150 mg BID, Labetolol 100 mg BID, and spironolactone '25mg'$  daily for HTN. Previous intolerant to amlodipine, HCTZ. She had moderate LVH noted on Echo 2020 with LVEF 60-65% and no significant valvular disease.She had Zio 2021 showing frequent PVCs with 23% bvurden.  Furthermore, she was recently found to have hypermetabolic left upper lung nodule as well as hypermetabolic hilar and mediastinal lymph nodes, left scapular lesion, area of right acetabulum and rectum, concerning for metastatic disease on PET scan.  She is followed by [facility] pulmonology/oncology, underwent CT-guided percutaneous biopsy of left scapular lytic bone lesion on 06/08/2022 and bronchoscopy with EBUS on 07/04/2022.  Bronchoscopy from 06/24/2022 report showed observed normal middle trachea, lower trachea, main carina, left main stem, LUL, lingula, LLL, right main stem, RUL, bronchus intermedius, RML and RLL. Lymph nodes observed at stations 10L and 7. Sampled 10L and 7 using TBNA.  Final pathology report is not available at this time.  She is pending follow-up with her oncology team for cancer evaluation.  Past Medical History:  Diagnosis Date   Bronchitis    COPD (chronic obstructive pulmonary disease) ([place])    Diabetes mellitus without complication ([place])    Hypertension     Past  Surgical History:  Procedure Laterality Date   CESAREAN SECTION     CHOLECYSTECTOMY     FOOT SURGERY     HERNIA REPAIR       Home Medications:  Prior to Admission medications   Medication Sig Start Date End Date Taking? Authorizing  Provider  acetaminophen (TYLENOL) 650 MG CR tablet Take 650 mg by mouth every 8 (eight) hours as needed for pain.   Yes [provider]  albuterol (PROVENTIL HFA;VENTOLIN HFA) 108 (90 BASE) MCG/ACT inhaler Inhale 2 puffs into the lungs every 6 (six) hours as needed for shortness of breath (cough). 05/28/15  Yes Mesner, Corene Cornea, MD  aspirin EC 81 MG tablet Take 81 mg by mouth daily. Swallow whole.   Yes [provider]  atorvastatin (LIPITOR) 40 MG tablet Take 40 mg by mouth daily.   Yes [provider]  Calcium-Magnesium-Vitamin D (CALCIUM 1200+D3 PO) Take 1 capsule by mouth daily.   Yes [provider]  DM-Phenylephrine-Acetaminophen (VICKS DAYQUIL COLD & FLU) 10-5-325 MG CAPS Take 2 capsules by mouth every 6 (six) hours as needed (for cough).   Yes [provider]  DULoxetine (CYMBALTA) 60 MG capsule Take 60 mg by mouth 2 (two) times daily. 06/05/22  Yes [provider]  fluticasone (FLONASE) 50 MCG/ACT nasal spray Place 2 sprays into both nostrils daily.   Yes [provider]  fluticasone-salmeterol (ADVAIR) 250-50 MCG/ACT AEPB Inhale 1 puff into the lungs in the morning and at bedtime. 11/24/21  Yes [provider]  furosemide (LASIX) 20 MG tablet Take 20 mg by mouth daily. 07/18/22  Yes [provider]  irbesartan (AVAPRO) 150 MG tablet Take 150 mg by mouth 2 (two) times daily.   Yes [provider]  labetalol (NORMODYNE) 100 MG tablet Take 100 mg by mouth 2 (two) times daily.   Yes [provider]  Magnesium 250 MG TABS Take 500 mg by mouth daily.   Yes [provider]  Multiple Vitamins-Minerals (ONE-A-DAY WOMENS 50 PLUS) TABS Take 1 tablet by mouth daily.   Yes [provider]  NIFEdipine (PROCARDIA XL/NIFEDICAL-XL) 90 MG 24 hr tablet Take 90 mg by mouth daily. 05/22/22  Yes [provider]  Omega-3 Fatty Acids ([employer] FISH OIL HALF-THE-SIZE) 500 MG CAPS Take 500 mg by mouth  daily.   Yes [provider]  pantoprazole (PROTONIX) 40 MG tablet Take 40 mg by mouth daily.   Yes [provider]  sitaGLIPtan-metformin (JANUMET) 50-500 MG per tablet Take 1 tablet by mouth 2 (two) times daily with a meal.   Yes [provider]    Inpatient Medications: Scheduled Meds:  apixaban  5 mg Oral BID   aspirin EC  81 mg Oral Daily   atorvastatin  40 mg Oral Daily   DULoxetine  60 mg Oral BID   insulin aspart  0-5 Units Subcutaneous QHS   insulin aspart  0-9 Units Subcutaneous TID WC   irbesartan  150 mg Oral BID   labetalol  100 mg Oral BID   nicotine  14 mg Transdermal Daily   pantoprazole  40 mg Oral Daily   polyethylene glycol  17 g Oral BID   Continuous Infusions:  azithromycin Stopped (07/26/22 2257)   cefTRIAXone (ROCEPHIN)  IV Stopped (07/26/22 2139)   diltiazem (CARDIZEM) infusion 10 mg/hr (07/27/22 9485)   lactated ringers Stopped (07/24/22 0103)   PRN Meds: acetaminophen **OR** acetaminophen, diphenhydrAMINE, guaiFENesin, ondansetron **OR** ondansetron (ZOFRAN) IV, mouth rinse, oxyCODONE, phenol, traZODone  Allergies:  Allergies  Allergen Reactions   Hydrochlorothiazide Nausea Only and Swelling    Legs were swollen   Amlodipine Swelling   Nebivolol Nausea Only    Abdominal pain    Social History:   Social History   Socioeconomic History   Marital status: Married    Spouse name: Not on file   Number of children: Not on file   Years of education: Not on file   Highest education level: Not on file  Occupational History   Not on file  Tobacco Use   Smoking status: Some Days    Packs/day: 1.00    Types: Cigarettes   Smokeless tobacco: Never  Substance and Sexual Activity   Alcohol use: No   Drug use: No   Sexual activity: Not on file  Other Topics Concern   Not on file  Social History Narrative   Not on file   Social Determinants of Health   Financial Resource Strain: Not on file  Food Insecurity: No Food  Insecurity (07/24/2022)   Hunger Vital Sign    Worried About Running Out of Food in the Last Year: Never true    Ran Out of Food in the Last Year: Never true  Transportation Needs: No Transportation Needs (07/24/2022)   PRAPARE - Hydrologist (Medical): No    Lack of Transportation (Non-Medical): No  Physical Activity: Not on file  Stress: Not on file  Social Connections: Not on file  Intimate Partner Violence: Not At Risk (07/24/2022)   Humiliation, Afraid, Rape, and Kick questionnaire    Fear of Current or Ex-Partner: No    Emotionally Abused: No    Physically Abused: No    Sexually Abused: No    Family History:    Family History  Problem Relation Age of Onset   Diabetes Mellitus II Sister      ROS:  Constitutional: Denied fever, chills, malaise, night sweats Eyes: Denied vision change or loss Ears/Nose/Mouth/Throat: Denied ear ache, sore throat, sinus pain Cardiovascular: see HPI  Respiratory: see HPI  Gastrointestinal: Denied nausea, vomiting, abdominal pain, diarrhea Genital/Urinary: Denied dysuria, hematuria, urinary frequency/urgency Musculoskeletal: Denied muscle ache, joint pain, weakness Skin: Denied rash, wound Neuro: Denied headache, dizziness, syncope Psych: Denied history of depression/anxiety  Endocrine: history of diabetes   Physical Exam/Data:   Vitals:   07/27/22 0503 07/27/22 0539 07/27/22 0758 07/27/22 0909  BP: 133/73 124/74 123/69   Pulse: 98  87 83  Resp:  '20 20 17  '$ Temp:  98.6 F (37 C) 99.3 F (37.4 C) 98.5 F (36.9 C)  TempSrc:  Oral Oral Oral  SpO2:  93% 92%   Weight:      Height:        Intake/Output Summary (Last 24 hours) at 07/27/2022 1018 Last data filed at 07/27/2022 0800 Gross per 24 hour  Intake 2849.08 ml  Output 650 ml  Net 2199.08 ml      07/24/2022    3:00 AM 07/23/2022    6:58 PM 08/02/2015    5:00 AM  Last 3 Weights  Weight (lbs) 153 lb 7 oz 169 lb 12.1 oz 169 lb 14.4 oz  Weight (kg) 69.6 kg  77 kg 77.066 kg     Body mass index is 29.97 kg/m.   Vitals:  Vitals:   07/27/22 0758 07/27/22 0909  BP: 123/69   Pulse: 87 83  Resp: 20 17  Temp: 99.3 F (37.4 C) 98.5 F (36.9 C)  SpO2: 92%  General Appearance: In no apparent distress, laying in bed HEENT: Normocephalic, atraumatic.  Neck: Supple, trachea midline, no JVDs Cardiovascular: Regular rate and rhythm, normal S1-S2,  no murmur Respiratory: Resting breathing unlabored, lungs sounds clear but diminished at base to auscultation bilaterally, no use of accessory muscles. On [place] oxygen.  Gastrointestinal: Bowel sounds positive, abdomen soft Extremities: Able to move all extremities in bed without difficulty,trace edema of BLE Musculoskeletal: Normal muscle bulk and tone Skin: Intact, warm, dry. No rashes or petechiae noted in exposed areas.  Neurologic: Alert, oriented to person, place and time. Fluent speech,  no cognitive deficit, Psychiatric: Normal affect. Mood is appropriate.     EKG:  The EKG was personally reviewed and demonstrates:    EKG today from 4:18 AM revealed A-fib RVR with ventricular rate of 147bpm, rate associated ST depression of lateral leads.  Telemetry:  Telemetry was personally reviewed and demonstrates:    Paroxysmal A fib/flutter with VR up to 150s, converted to SR 60-70s at 5:44AM on 07/27/22  Relevant CV Studies:  Echocardiogram is pending  Laboratory Data:  High Sensitivity Troponin:   Recent Labs  Lab 07/23/22 2032 07/23/22 2247  TROPONINIHS 8 7     Chemistry Recent Labs  Lab 07/25/22 0726 07/26/22 0314 07/27/22 0351  NA 134* 132* 129*  K 3.9 3.9 3.8  CL 102 99 94*  CO2 '26 24 23  '$ GLUCOSE 119* 130* 133*  BUN 6* 8 12  CREATININE 0.63 0.60 0.66  CALCIUM 8.9 8.8* 8.7*  MG 1.8  --   --   GFRNONAA >60 >60 >60  ANIONGAP '6 9 12    '$ Recent Labs  Lab 07/23/22 2032 07/26/22 0314 07/27/22 0351  PROT 6.8 6.1* 6.2*  ALBUMIN 3.5 2.9* 2.7*  AST '20 26 26  '$ ALT '15 18 27   '$ ALKPHOS 62 67 73  BILITOT 0.7 0.8 0.8   Lipids No results for input(s): "CHOL", "TRIG", "HDL", "LABVLDL", "LDLCALC", "CHOLHDL" in the last 168 hours.  Hematology Recent Labs  Lab 07/25/22 0726 07/26/22 0314 07/27/22 0351  WBC 10.6* 11.5* 11.3*  RBC 4.70 4.46 4.33  HGB 12.0 11.4* 11.3*  HCT 37.8 34.9* 33.7*  MCV 80.4 78.3* 77.8*  MCH 25.5* 25.6* 26.1  MCHC 31.7 32.7 33.5  RDW 14.3 14.0 13.8  PLT 371 373 338   Thyroid  Recent Labs  Lab 07/27/22 0351  TSH 1.374    BNPNo results for input(s): "BNP", "PROBNP" in the last 168 hours.  DDimer No results for input(s): "DDIMER" in the last 168 hours.   Radiology/Studies:  CT L-SPINE NO CHARGE  Result Date: 07/23/2022 CLINICAL DATA:  Low back pain. EXAM: CT LUMBAR SPINE WITHOUT CONTRAST TECHNIQUE: Multidetector CT imaging of the lumbar spine was performed without intravenous contrast administration. Multiplanar CT image reconstructions were also generated. RADIATION DOSE REDUCTION: This exam was performed according to the departmental dose-optimization program which includes automated exposure control, adjustment of the mA and/or kV according to patient size and/or use of iterative reconstruction technique. COMPARISON:  None Available. FINDINGS: Segmentation: 5 lumbar type vertebrae. Alignment: Mild anterolisthesis of L4 Vertebrae: No acute fracture or focal pathologic process. Osteopenia. Paraspinal and other soft tissues: Negative. Disc levels: T12-L1: Disc height loss and mild spinal canal stenosis. L1-L2: No significant disc bulge, spinal canal or neural foraminal stenosis. L2-L3: Disc bulge with moderate bilateral lateral recess stenosis no significant neural foraminal stenosis L3-L4: Disc bulge with moderate bilateral lateral recess stenosis. No significant neural foraminal stenosis. Mild facet joint arthropathy L4-L5: Broad-based  disc bulge with severe spinal canal and lateral recess stenosis bilaterally. Mild bilateral neural foraminal  stenosis L5-S1: Disc bulge with mild bilateral lateral recess stenosis. No significant neural foraminal stenosis. Mild bilateral facet joint arthropathy IMPRESSION: 1. No acute fracture or subluxation. 2. 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 3. Multilevel facet joint arthropathy. Electronically Signed   By: Keane Police D.O.   On: 07/23/2022 23:43   CT ABDOMEN PELVIS W CONTRAST  Result Date: 07/23/2022 CLINICAL DATA:  Abdominal pain, acute EXAM: CT ABDOMEN AND PELVIS WITH CONTRAST TECHNIQUE: Multidetector CT imaging of the abdomen and pelvis was performed using the standard protocol following bolus administration of intravenous contrast. RADIATION DOSE REDUCTION: This exam was performed according to the departmental dose-optimization program which includes automated exposure control, adjustment of the mA and/or kV according to patient size and/or use of iterative reconstruction technique. CONTRAST:  59m OMNIPAQUE IOHEXOL 350 MG/ML SOLN COMPARISON:  Examination dated August 02, 2015 FINDINGS: Lower chest: No acute abnormality.  Left basilar atelectasis. Hepatobiliary: No focal liver abnormality is seen. Status post cholecystectomy. No biliary dilatation. Pancreas: Unremarkable. No pancreatic ductal dilatation or surrounding inflammatory changes. Spleen: Normal in size without focal abnormality. Adrenals/Urinary Tract: Adrenal glands are unremarkable. Multiple punctate bilateral renal calculi without evidence of hydronephrosis or ureteral calculus. No evidence of pyelonephritis or perinephric fat stranding. Bladder is unremarkable. Stomach/Bowel: Stomach is within normal limits. Appendix appears normal. No evidence of bowel wall thickening, distention, or inflammatory changes. Vascular/Lymphatic: Prominent aortic atherosclerosis of the infrarenal abdominal aorta, which is however normal in caliber. No enlarged abdominal or pelvic lymph nodes.  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 No adnexal mass Other: No abdominal wall hernia or abnormality. No abdominopelvic ascites. Musculoskeletal: Mild multilevel degenerate disc disease of the lumbar spine. No acute osseous abnormality. IMPRESSION: 1. No CT evidence of acute abdominal/pelvic process. 2. Multiple punctate bilateral renal calculi without evidence of hydronephrosis or ureteral calculus. 3. Normal appendix. No evidence of colitis or diverticulitis. 4. 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 5. 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 6. Aortic atherosclerosis. Electronically Signed   By: IKeane PoliceD.O.   On: 07/23/2022 23:33   DG Chest Portable 1 View  Result Date: 07/23/2022 CLINICAL DATA:  Chest pain. EXAM: PORTABLE CHEST 1 VIEW COMPARISON:  08/02/2015. FINDINGS: The heart size and mediastinal contours are within normal limits. There is atherosclerotic calcification of the aorta. Focal airspace disease is noted in the left upper lobe. Interstitial prominence is present at the lung bases bilaterally. No effusion or pneumothorax. No acute osseous abnormality. IMPRESSION: Focal airspace disease in the left upper lobe with interstitial prominence at the lung bases bilaterally, suspicious for pneumonia. Electronically Signed   By: LBrett FairyM.D.   On: 07/23/2022 20:20     Assessment and Plan:   Paroxysmal Atrial fibrillation/flutter with RVR, new diagnosis - Noted on EKG and telemetry today, telemetry strips concerning for atypical flutter, spontaneously converted at 5:44AM on 07/27/22, no cardiac symptoms reported  - TSH WNL, likely infection mediated  - Stop diltiazem gtt - On labetalol '100mg'$  BID at home for HTN, if EF preserved, may add cardizem for rate control strategy - Pending Echo, will follow  - Agree with anticoagulation with Eliquis for  CHA2DS2-VASc Score = 7 , discussed bleeding precaution, on ASA '81mg'$  as well, consider neurology input on need of  ASA along with Eliquis to re-evaluate bleeding risk  - Keep Mag >2 and K >4 - may follow up with [facility]ology   Sepsis secondary to multifocal pneumonia Acute hypoxic respiratory failure COPD Type 2 diabetes Suspected metastatic malignancy with unknown primary - per primary team   Risk Assessment/Risk Scores:    CHA2DS2-VASc Score = 7  This indicates a 11.2% annual risk of stroke. The patient's score is based upon: CHF History: 0 HTN History: 1 Diabetes History: 1 Stroke History: 2 Vascular Disease History: 1 Age Score: 1 Gender Score: 1     For questions or updates, please contact [place] Please consult [email or web address] for contact info under    Signed, Margie Billet, NP  07/27/2022 10:18 AM

## 2022-07-27 NOTE — Progress Notes (Signed)
   07/27/22 0539  Vitals  Temp 98.6 F (37 C)  Temp Source Oral  BP 124/74  MAP (mmHg) 88  BP Location Left Arm  BP Method Automatic  Patient Position (if appropriate) Lying  Pulse Rate Source Monitor  ECG Heart Rate 91  Resp 20  MEWS COLOR  MEWS Score Color Green  Oxygen Therapy  SpO2 93 %  O2 Device Nasal Cannula  O2 Flow Rate (L/min) 4 L/min  MEWS Score  MEWS Temp 0  MEWS Systolic 0  MEWS Pulse 0  MEWS RR 0  MEWS LOC 0  MEWS Score 0   Received pt to 4E20 from 5MW. VSS. HR initally in 150s but pt soon converted to NSR 90s while getting her gown changed. C/o of pleuritic chest pain- given PRNs. CBG 154. Pt oriented to room and call light- no other needs at this time.  Jaymes Graff, RN

## 2022-07-27 NOTE — Progress Notes (Signed)
  Echocardiogram 2D Echocardiogram has been performed.  Rhonda Blackwell 07/27/2022, 11:20 AM

## 2022-07-27 NOTE — Progress Notes (Addendum)
New onset A fib with RVR. HR 150-160. BP stable Patient asymptomatic Lopressor x1 given w/o success. Diltiazem '10mg'$  x1 ordered. Patient allergic to norvasc which causes lower extremity swelling. Scant response to lopressor or dilt, will start dilt drip and monitor. CHADvasc score: 6 (sex, age, HTN, DM, H/o CVA) Patient denies any recent bleed or surgery. Eliquis started. Occult stool ordered 2D echo ordered Cardiac consult placed Transfer to progressived care

## 2022-07-27 NOTE — Care Management Important Message (Signed)
Important Message  Patient Details  Name: Rhonda Blackwell MRN: [id number] Date of Birth: December 29, 1950   Medicare Important Message Given:  Yes     Shelda Altes 07/27/2022, 10:26 AM

## 2022-07-28 ENCOUNTER — Other Ambulatory Visit (HOSPITAL_COMMUNITY): Payer: Self-pay

## 2022-07-28 DIAGNOSIS — I10 Essential (primary) hypertension: Secondary | ICD-10-CM | POA: Diagnosis not present

## 2022-07-28 DIAGNOSIS — A419 Sepsis, unspecified organism: Secondary | ICD-10-CM | POA: Diagnosis not present

## 2022-07-28 DIAGNOSIS — I4891 Unspecified atrial fibrillation: Secondary | ICD-10-CM | POA: Diagnosis not present

## 2022-07-28 DIAGNOSIS — J189 Pneumonia, unspecified organism: Secondary | ICD-10-CM | POA: Diagnosis not present

## 2022-07-28 LAB — CULTURE, BLOOD (ROUTINE X 2)
Culture: NO GROWTH
Culture: NO GROWTH
Special Requests: ADEQUATE

## 2022-07-28 LAB — CBC
HCT: 34 % — ABNORMAL LOW (ref 36.0–46.0)
Hemoglobin: 11.3 g/dL — ABNORMAL LOW (ref 12.0–15.0)
MCH: 25.9 pg — ABNORMAL LOW (ref 26.0–34.0)
MCHC: 33.2 g/dL (ref 30.0–36.0)
MCV: 77.8 fL — ABNORMAL LOW (ref 80.0–100.0)
Platelets: 367 10*3/uL (ref 150–400)
RBC: 4.37 MIL/uL (ref 3.87–5.11)
RDW: 13.7 % (ref 11.5–15.5)
WBC: 11 10*3/uL — ABNORMAL HIGH (ref 4.0–10.5)
nRBC: 0 % (ref 0.0–0.2)

## 2022-07-28 LAB — COMPREHENSIVE METABOLIC PANEL
ALT: 68 U/L — ABNORMAL HIGH (ref 0–44)
AST: 79 U/L — ABNORMAL HIGH (ref 15–41)
Albumin: 2.6 g/dL — ABNORMAL LOW (ref 3.5–5.0)
Alkaline Phosphatase: 90 U/L (ref 38–126)
Anion gap: 13 (ref 5–15)
BUN: 12 mg/dL (ref 8–23)
CO2: 24 mmol/L (ref 22–32)
Calcium: 8.7 mg/dL — ABNORMAL LOW (ref 8.9–10.3)
Chloride: 95 mmol/L — ABNORMAL LOW (ref 98–111)
Creatinine, Ser: 0.61 mg/dL (ref 0.44–1.00)
GFR, Estimated: >60 mL/min (ref >60)
Glucose, Bld: 143 mg/dL — ABNORMAL HIGH (ref 70–99)
Potassium: 3.9 mmol/L (ref 3.5–5.1)
Sodium: 132 mmol/L — ABNORMAL LOW (ref 135–145)
Total Bilirubin: 0.6 mg/dL (ref 0.3–1.2)
Total Protein: 6.2 g/dL — ABNORMAL LOW (ref 6.5–8.1)

## 2022-07-28 LAB — GLUCOSE, CAPILLARY
Glucose-Capillary: 143 mg/dL — ABNORMAL HIGH (ref 70–99)
Glucose-Capillary: 126 mg/dL — ABNORMAL HIGH (ref 70–99)

## 2022-07-28 MED ORDER — DILTIAZEM HCL ER COATED BEADS 120 MG PO CP24
120.0000 mg | ORAL_CAPSULE | Freq: Every day | ORAL | Status: DC
  Administered 2022-07-28: 120 mg via ORAL
  Filled 2022-07-28: qty 1

## 2022-07-28 MED ORDER — APIXABAN 5 MG PO TABS
5.0000 mg | ORAL_TABLET | Freq: Two times a day (BID) | ORAL | 0 refills | Status: DC
  Filled 2022-07-28: qty 60, 30d supply, fill #0

## 2022-07-28 MED ORDER — AZITHROMYCIN 250 MG PO TABS
250.0000 mg | ORAL_TABLET | Freq: Every day | ORAL | 0 refills | Status: DC
  Filled 2022-07-28: qty 2, 2d supply, fill #0

## 2022-07-28 MED ORDER — DILTIAZEM HCL ER COATED BEADS 120 MG PO CP24
120.0000 mg | ORAL_CAPSULE | Freq: Every day | ORAL | 0 refills | Status: DC
  Filled 2022-07-28: qty 30, 30d supply, fill #0

## 2022-07-28 MED ORDER — CEFDINIR 300 MG PO CAPS
300.0000 mg | ORAL_CAPSULE | Freq: Two times a day (BID) | ORAL | 0 refills | Status: AC
  Filled 2022-07-28: qty 4, 2d supply, fill #0

## 2022-07-28 NOTE — Progress Notes (Addendum)
Rounding Note    Patient Name: Rhonda Blackwell Date of Encounter: 07/28/2022  [place] Cardiologist: [facility]  Subjective   She states she is feeling much better today, had BM last night, no chest pain.   Inpatient Medications    Scheduled Meds:  apixaban  5 mg Oral BID   atorvastatin  40 mg Oral Daily   diltiazem  120 mg Oral Daily   DULoxetine  60 mg Oral BID   insulin aspart  0-5 Units Subcutaneous QHS   insulin aspart  0-9 Units Subcutaneous TID WC   irbesartan  150 mg Oral BID   labetalol  100 mg Oral BID   nicotine  14 mg Transdermal Daily   pantoprazole  40 mg Oral Daily   polyethylene glycol  17 g Oral BID   Continuous Infusions:  cefTRIAXone (ROCEPHIN)  IV Stopped (07/27/22 2059)   PRN Meds: acetaminophen **OR** acetaminophen, diphenhydrAMINE, guaiFENesin, ipratropium-albuterol, ondansetron **OR** ondansetron (ZOFRAN) IV, mouth rinse, oxyCODONE, phenol, traZODone   Vital Signs    Vitals:   07/27/22 2025 07/27/22 2329 07/28/22 0315 07/28/22 0748  BP: (!) 152/72 125/67 128/70 134/75  Pulse: 82 74 87 88  Resp: 18 19 20 18  $ Temp: 98.3 F (36.8 C) 98.9 F (37.2 C) 98.2 F (36.8 C) 98.2 F (36.8 C)  TempSrc: Oral Oral Oral Oral  SpO2: 95% 92% 91% 92%  Weight:      Height:        Intake/Output Summary (Last 24 hours) at 07/28/2022 0842 Last data filed at 07/28/2022 0221 Gross per 24 hour  Intake 636.07 ml  Output 1100 ml  Net -463.93 ml      07/24/2022    3:00 AM 07/23/2022    6:58 PM 08/02/2015    5:00 AM  Last 3 Weights  Weight (lbs) 153 lb 7 oz 169 lb 12.1 oz 169 lb 14.4 oz  Weight (kg) 69.6 kg 77 kg 77.066 kg      Telemetry    Sinus rhythm, 95ss, PACs noted, no recurrence of A fib  - Personally Reviewed  ECG    N/A today - Personally Reviewed  Physical Exam   GEN: No acute distress.   Neck: No JVD Cardiac: RRR, no murmurs, rubs, or gallops.  Respiratory: Clear to auscultation bilaterally. ON [place] oxygen. Speaks full sentence  GI:  Soft, nontender, non-distended  MS: Trace leg edema; No deformity. Neuro:  Nonfocal  Psych: Normal affect   Labs    High Sensitivity Troponin:   Recent Labs  Lab 07/23/22 2032 07/23/22 2247  TROPONINIHS 8 7     Chemistry Recent Labs  Lab 07/25/22 0726 07/26/22 0314 07/27/22 0351 07/28/22 0113  NA 134* 132* 129* 132*  K 3.9 3.9 3.8 3.9  CL 102 99 94* 95*  CO2 26 24 23 24  $ GLUCOSE 119* 130* 133* 143*  BUN 6* 8 12 12  $ CREATININE 0.63 0.60 0.66 0.61  CALCIUM 8.9 8.8* 8.7* 8.7*  MG 1.8  --  1.7  --   PROT  --  6.1* 6.2* 6.2*  ALBUMIN  --  2.9* 2.7* 2.6*  AST  --  26 26 79*  ALT  --  18 27 68*  ALKPHOS  --  67 73 90  BILITOT  --  0.8 0.8 0.6  GFRNONAA >60 >60 >60 >60  ANIONGAP 6 9 12 13    $ Lipids No results for input(s): "CHOL", "TRIG", "HDL", "LABVLDL", "LDLCALC", "CHOLHDL" in the last 168 hours.  Hematology Recent  Labs  Lab 07/26/22 0314 07/27/22 0351 07/28/22 0113  WBC 11.5* 11.3* 11.0*  RBC 4.46 4.33 4.37  HGB 11.4* 11.3* 11.3*  HCT 34.9* 33.7* 34.0*  MCV 78.3* 77.8* 77.8*  MCH 25.6* 26.1 25.9*  MCHC 32.7 33.5 33.2  RDW 14.0 13.8 13.7  PLT 373 338 367   Thyroid  Recent Labs  Lab 07/27/22 0351  TSH 1.374    BNPNo results for input(s): "BNP", "PROBNP" in the last 168 hours.  DDimer No results for input(s): "DDIMER" in the last 168 hours.   Radiology    ECHOCARDIOGRAM COMPLETE  Result Date: 07/27/2022    ECHOCARDIOGRAM REPORT   Patient Name:   Rhonda Blackwell Date of Exam: 07/27/2022 Medical Rec #:  [id number]   Height:       60.0 in Accession #:    [id number]  Weight:       153.4 lb Date of Birth:  [birth year]    BSA:          1.668 m Patient Age:    72 years    BP:           123/69 mmHg Patient Gender: F           HR:           72 bpm. Exam Location:  Inpatient Procedure: 2D Echo, Cardiac Doppler and Color Doppler Indications:    I48.91* Unspeicified atrial fibrillation  History:        Patient has no prior history of Echocardiogram examinations.                  COPD, Signs/Symptoms:Bacteremia, Dyspnea and Shortness of                 Breath; Risk Factors:Diabetes, Hypertension and Dyslipidemia.                 Metastatic cancer. Pneumonia.  Sonographer:    Roseanna Rainbow RDCS Referring Phys: Edythe Lynn CROSLEY IMPRESSIONS  1. Left ventricular ejection fraction, by estimation, is >75%. The left ventricle has hyperdynamic function. The left ventricle has no regional wall motion abnormalities. There is mild concentric left ventricular hypertrophy. Left ventricular diastolic parameters are consistent with Grade I diastolic dysfunction (impaired relaxation).  2. Right ventricular systolic function is normal. The right ventricular size is normal.  3. The mitral valve is normal in structure. Trivial mitral valve regurgitation. No evidence of mitral stenosis.  4. The aortic valve is tricuspid. Aortic valve regurgitation is not visualized. No aortic stenosis is present.  5. The inferior vena cava is normal in size with greater than 50% respiratory variability, suggesting right atrial pressure of 3 mmHg. Comparison(s): No prior Echocardiogram. FINDINGS  Left Ventricle: Left ventricular ejection fraction, by estimation, is >75%. The left ventricle has hyperdynamic function. The left ventricle has no regional wall motion abnormalities. The left ventricular internal cavity size was normal in size. There is mild concentric left ventricular hypertrophy. Left ventricular diastolic parameters are consistent with Grade I diastolic dysfunction (impaired relaxation). Right Ventricle: The right ventricular size is normal. Right ventricular systolic function is normal. Left [facility]: Left atrial size was normal in size. Right [facility]: Right atrial size was normal in size. Pericardium: There is no evidence of pericardial effusion. Mitral Valve: The mitral valve is normal in structure. Mild mitral annular calcification. Trivial mitral valve regurgitation. No evidence of mitral valve stenosis. Tricuspid  Valve: The tricuspid valve is normal in structure. Tricuspid valve regurgitation is not demonstrated. No evidence of tricuspid stenosis.  Aortic Valve: The aortic valve is tricuspid. Aortic valve regurgitation is not visualized. No aortic stenosis is present. Pulmonic Valve: The pulmonic valve was not well visualized. Pulmonic valve regurgitation is not visualized. No evidence of pulmonic stenosis. Aorta: The aortic root is normal in size and structure. Venous: The inferior vena cava is normal in size with greater than 50% respiratory variability, suggesting right atrial pressure of 3 mmHg. IAS/Shunts: No atrial level shunt detected by color flow Doppler.  LEFT VENTRICLE PLAX 2D LVIDd:         3.70 cm     Diastology LVIDs:         2.50 cm     LV e' medial:    7.94 cm/s LV PW:         1.30 cm     LV E/e' medial:  10.8 LV IVS:        1.30 cm     LV e' lateral:   10.70 cm/s LVOT diam:     2.20 cm     LV E/e' lateral: 8.0 LV SV:         97 LV SV Index:   58 LVOT Area:     3.80 cm  LV Volumes (MOD) LV vol d, MOD A2C: 66.2 ml LV vol d, MOD A4C: 66.9 ml LV vol s, MOD A2C: 17.3 ml LV vol s, MOD A4C: 23.6 ml LV SV MOD A2C:     48.9 ml LV SV MOD A4C:     66.9 ml LV SV MOD BP:      49.2 ml RIGHT VENTRICLE             IVC RV S prime:     10.90 cm/s  IVC diam: 1.90 cm TAPSE (M-mode): 1.6 cm LEFT ATRIUM             Index        RIGHT ATRIUM           Index LA diam:        3.30 cm 1.98 cm/m   RA Area:     12.90 cm LA Vol (A2C):   47.8 ml 28.66 ml/m  RA Volume:   27.00 ml  16.19 ml/m LA Vol (A4C):   45.1 ml 27.04 ml/m LA Biplane Vol: 55.2 ml 33.10 ml/m  AORTIC VALVE LVOT Vmax:   126.00 cm/s LVOT Vmean:  79.400 cm/s LVOT VTI:    0.256 m  AORTA Ao Root diam: 3.40 cm Ao Asc diam:  3.50 cm MITRAL VALVE MV Area (PHT): 3.21 cm    SHUNTS MV Decel Time: 236 msec    Systemic VTI:  0.26 m MV E velocity: 85.40 cm/s  Systemic Diam: 2.20 cm MV A velocity: 87.75 cm/s MV E/A ratio:  0.97 Kirk Ruths MD Electronically signed by Kirk Ruths MD Signature Date/Time: 07/27/2022/1:12:46 PM    Final    DG CHEST PORT 1 VIEW  Result Date: 07/27/2022 CLINICAL DATA:  Hypoxia EXAM: PORTABLE CHEST 1 VIEW COMPARISON:  Portable exam 1034 hours compared to 07/23/2022 FINDINGS: Enlargement of cardiac silhouette. Mediastinal contours and pulmonary vascularity normal. Atherosclerotic calcification aorta. Increased atelectasis versus consolidation of LEFT lower lobe. Persistent LEFT upper lobe infiltrate has slightly increased atelectasis. Enlargement of RIGHT hilum seen, at least a portion of which is due to enlarged pulmonary artery. RIGHT lung otherwise clear, underlying emphysematous changes noted. No pleural effusion or pneumothorax. Bones demineralized. IMPRESSION: Increased atelectasis versus consolidation of LEFT lower lobe. Persistent LEFT upper lobe infiltrate  and slightly increased atelectasis. Emphysematous changes with enlargement of RIGHT pulmonary hilum, probably due to enlarged central pulmonary artery; follow-up upright PA and lateral chest radiographs recommended when patient's condition permits to further evaluate. Electronically Signed   By: Lavonia Dana M.D.   On: 07/27/2022 10:51    Cardiac Studies   Echo 07/27/22:  1. Left ventricular ejection fraction, by estimation, is >75%. The left  ventricle has hyperdynamic function. The left ventricle has no regional  wall motion abnormalities. There is mild concentric left ventricular  hypertrophy. Left ventricular diastolic  parameters are consistent with Grade I diastolic dysfunction (impaired  relaxation).   2. Right ventricular systolic function is normal. The right ventricular  size is normal.   3. The mitral valve is normal in structure. Trivial mitral valve  regurgitation. No evidence of mitral stenosis.   4. The aortic valve is tricuspid. Aortic valve regurgitation is not  visualized. No aortic stenosis is present.   5. The inferior vena cava is normal in size with greater  than 50%  respiratory variability, suggesting right atrial pressure of 3 mmHg.   Comparison(s): No prior Echocardiogram.    Patient Profile     Rhonda Blackwell is a 72 y.o. female with a hx of COPD, type II DM, CVA, HTN,  hypermetabolic lung nodule concerning for primary bronchometastatic malignancy, who is being seen 07/27/2022 for the evaluation of A fib at the request of Dr Wyline Copas, currently admitted with sepsis 2/2 pneumonia.  Assessment & Plan    Paroxysmal Atrial fibrillation with RVR, new diagnosis - Noted on EKG and telemetry this admission, telemetry strips concerning for atypical flutter, spontaneously converted at 5:44AM on 07/27/22, no cardiac symptoms reported, no recurrence so far  - TSH WNL, likely infection mediated  - Stop diltiazem gtt, no recurrence of A fib, SR 90s, will add PO Cardizem 175m daily today  - On labetalol 1024mBID at home for HTN, may continue  - Echo showed LVEF >75%, grade I DD, trivial MR - Agree with anticoagulation with Eliquis for CHA2DS2-VASc Score = 7 , discussed bleeding precaution, on ASA 8162ms well, consider neurology input on need of ASA along with Eliquis to re-evaluate bleeding risk  - Keep Mag >2 and K >4 - patient wish to follow up [facility] , please follow up in 1-2 weeks    Sepsis secondary to multifocal pneumonia Acute hypoxic respiratory failure COPD Type 2 diabetes Suspected metastatic malignancy with unknown primary - per primary team     For questions or updates, please contact [facility] consult [email or web address] for contact info under        Signed, XikMargie BilletP  07/28/2022, 8:42 AM

## 2022-07-28 NOTE — Discharge Summary (Signed)
Physician Discharge Summary   Patient: Rhonda Blackwell MRN: [id number] DOB: Oct 31, 1950  Admit date:     07/23/2022  Discharge date: 07/28/22  Discharge Physician: Marylu Lund   PCP: [name], MD   Recommendations at discharge:    Follow up with PCP in 1-2 weeks Follow up with Oncology as scheduled Follow up with Cardiology as scheduled  Discharge Diagnoses: Principal Problem:   Sepsis due to pneumonia [facility]) Active Problems:   Acute respiratory failure with hypoxia ([place])   Type 2 diabetes mellitus without complications ([place])   Essential hypertension   Dyslipidemia   Atrial fibrillation with RVR ([place])  Resolved Problems:   * No resolved hospital problems. *  Hospital Course: 72 y.o. 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  Associated with wheezing and dyspnea.  She denies any fever or chills.  No nausea or vomiting or abdominal pain   Assessment and Plan: Sepsis due to community-acquired multifocal pneumonia, present on admission -Tachycardic, tachypneic, community-acquired pneumonia seen on chest x-ray. -Started on Rocephin IV azithromycin empirically, to complete course with azithromycin and omnicef on d/c -Leukocytosis had improved from admit -Had required up to [facility]. Ordered and reviewed CXR. Emphysema with increased atelectasis  -O2 weaned to [facility] at rest, up to [facility] on ambulation -Encourage IS and flutter valve   Right upper quadrant abdominal pain, unclear etiology -CT abdomen and pelvis noted to be unrevealing. -Resolved with cathartics   Essential hypertension -Continued on cardizem 171m qday, irbesartan 1543mdaily, labetalol 10060mid   Type 2  diabetes, well-controlled -Hemoglobin A1c 6.3 on 07/24/2022 Continued insulin sliding scale while in hospital   Physical debility PT OT assessment [facility] for [facility] Fall precautions.   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 Fall precautions. Close follow-up with her medical oncologist outpatient.   History of CVA Had been on home aspirin, Lipitor -With addition of eliquis, stopped ASA   Chronic anxiety/depression Resume home Cymbalta.   Hyponatremia -lower at 129 this AM. Pt appears euvolemic on exam -Ordered serum osm, urine osm, urine Na   Afib RVR, new diagnosis -required cardizem gtt and transfer to progressive unit -Cardiology consulted -Pt weaned off cardizem gtt, on labetalol 100m49md, cardizem 150mg89mly -2d echo reviewed, hyperdynamic LV function -Now on eliquis, will hold ASA -Pt to follow up with primary Cardiologist       Consultants: Cardiology Procedures performed:   Disposition: Home Diet recommendation:  Carb modified diet DISCHARGE MEDICATION: Allergies as of 07/28/2022       Reactions   Hydrochlorothiazide Nausea Only, Swelling   Legs were swollen   Amlodipine Swelling   Nebivolol Nausea Only   Abdominal pain        Medication List     STOP taking these medications    aspirin EC 81 MG tablet   NIFEdipine 90 MG 24 hr tablet Commonly known as: PROCARDIA XL/NIFEDICAL-XL       TAKE these medications    acetaminophen 650 MG CR tablet Commonly known as: TYLENOL Take 650 mg by mouth every 8 (eight) hours as needed for pain.   albuterol 108 (90 Base) MCG/ACT inhaler Commonly  known as: VENTOLIN HFA Inhale 2 puffs into the lungs every 6 (six) hours as needed for shortness of breath (cough).   apixaban 5 MG Tabs tablet Commonly known as: ELIQUIS Take 1 tablet (5 mg total) by mouth 2 (two) times daily.   atorvastatin 40 MG tablet Commonly known as:  LIPITOR Take 40 mg by mouth daily.   azithromycin 250 MG tablet Commonly known as: ZITHROMAX 1 tab po daily x 2 more days   CALCIUM 1200+D3 PO Take 1 capsule by mouth daily.   cefdinir 300 MG capsule Commonly known as: OMNICEF Take 1 capsule (300 mg total) by mouth 2 (two) times daily for 2 days.   [employer] Fish Oil Half-The-Size 500 MG Caps Take 500 mg by mouth daily.   diltiazem 120 MG 24 hr capsule Commonly known as: CARDIZEM CD Take 1 capsule (120 mg total) by mouth daily. Start taking on: July 29, 2022   DULoxetine 60 MG capsule Commonly known as: CYMBALTA Take 60 mg by mouth 2 (two) times daily.   fluticasone 50 MCG/ACT nasal spray Commonly known as: FLONASE Place 2 sprays into both nostrils daily.   fluticasone-salmeterol 250-50 MCG/ACT Aepb Commonly known as: ADVAIR Inhale 1 puff into the lungs in the morning and at bedtime.   furosemide 20 MG tablet Commonly known as: LASIX Take 20 mg by mouth daily.   irbesartan 150 MG tablet Commonly known as: AVAPRO Take 150 mg by mouth 2 (two) times daily.   labetalol 100 MG tablet Commonly known as: NORMODYNE Take 100 mg by mouth 2 (two) times daily.   Magnesium 250 MG Tabs Take 500 mg by mouth daily.   One-A-Day Womens 50 Plus Tabs Take 1 tablet by mouth daily.   pantoprazole 40 MG tablet Commonly known as: PROTONIX Take 40 mg by mouth daily.   sitaGLIPtin-metformin 50-500 MG tablet Commonly known as: JANUMET Take 1 tablet by mouth 2 (two) times daily with a meal.   Vicks DayQuil Cold & Flu 10-5-325 MG Caps Generic drug: DM-Phenylephrine-Acetaminophen Take 2 capsules by mouth every 6 (six) hours as needed (for cough).               Durable Medical Equipment  (From admission, onward)           Start     Ordered   07/28/22 1356  For home use only DME oxygen  Once       Question Answer Comment  Length of Need 6 Months   Mode or (Route) Nasal cannula   Liters per Minute 2   Frequency  Continuous (stationary and portable oxygen unit needed)   Oxygen delivery system Gas      07/28/22 1356            Follow-up Information     [facility] Follow up.   Specialty: Home Health Services Why: Someone will call you to schedule first home visit. If you have not received a call after two days of discharging home, call their number listed. If no one comes to assess, call Case Manager at [phone]. Contact information: [place] [address] [phone]                Discharge Exam: Danley Danker Weights   07/23/22 1858 07/24/22 0300  Weight: 77 kg 69.6 kg   General exam: Awake, laying in bed, in nad Respiratory system: Normal respiratory effort, no wheezing Cardiovascular system: regular rate, s1, s2 Gastrointestinal system: Soft, nondistended, positive BS  Central nervous system: CN2-12 grossly intact, strength intact Extremities: Perfused, no clubbing Skin: Normal skin turgor, no notable skin lesions seen Psychiatry: Mood normal // no visual hallucinations   Condition at discharge: fair  The results of significant diagnostics from this hospitalization (including imaging, microbiology, ancillary and laboratory) are listed below for reference.   Imaging Studies: ECHOCARDIOGRAM COMPLETE  Result Date: 07/27/2022    ECHOCARDIOGRAM REPORT   Patient Name:   Dashonda Piascik Date of Exam: 07/27/2022 Medical Rec #:  [id number]   Height:       60.0 in Accession #:    [id number]  Weight:       153.4 lb Date of Birth:  Jan 16, 1951    BSA:          1.668 m Patient Age:    18 years    BP:           123/69 mmHg Patient Gender: F           HR:           72 bpm. Exam Location:  Inpatient Procedure: 2D Echo, Cardiac Doppler and Color Doppler Indications:    I48.91* Unspeicified atrial fibrillation  History:        Patient has no prior history of Echocardiogram examinations.                 COPD, Signs/Symptoms:Bacteremia, Dyspnea and Shortness of                  Breath; Risk Factors:Diabetes, Hypertension and Dyslipidemia.                 Metastatic cancer. Pneumonia.  Sonographer:    Roseanna Rainbow RDCS Referring Phys: Edythe Lynn [place] IMPRESSIONS  1. Left ventricular ejection fraction, by estimation, is >75%. The left ventricle has hyperdynamic function. The left ventricle has no regional wall motion abnormalities. There is mild concentric left ventricular hypertrophy. Left ventricular diastolic parameters are consistent with Grade I diastolic dysfunction (impaired relaxation).  2. Right ventricular systolic function is normal. The right ventricular size is normal.  3. The mitral valve is normal in structure. Trivial mitral valve regurgitation. No evidence of mitral stenosis.  4. The aortic valve is tricuspid. Aortic valve regurgitation is not visualized. No aortic stenosis is present.  5. The inferior vena cava is normal in size with greater than 50% respiratory variability, suggesting right atrial pressure of 3 mmHg. Comparison(s): No prior Echocardiogram. FINDINGS  Left Ventricle: Left ventricular ejection fraction, by estimation, is >75%. The left ventricle has hyperdynamic function. The left ventricle has no regional wall motion abnormalities. The left ventricular internal cavity size was normal in size. There is mild concentric left ventricular hypertrophy. Left ventricular diastolic parameters are consistent with Grade I diastolic dysfunction (impaired relaxation). Right Ventricle: The right ventricular size is normal. Right ventricular systolic function is normal. Left Atrium: Left atrial size was normal in size. Right Atrium: Right atrial size was normal in size. Pericardium: There is no evidence of pericardial effusion. Mitral Valve: The mitral valve is normal in structure. Mild mitral annular calcification. Trivial mitral valve regurgitation. No evidence of mitral valve stenosis. Tricuspid Valve: The tricuspid valve is normal in structure. Tricuspid valve  regurgitation is not demonstrated. No evidence of tricuspid stenosis. Aortic Valve: The aortic valve is tricuspid. Aortic valve regurgitation is not visualized. No aortic stenosis is present. Pulmonic Valve: The pulmonic valve was not well visualized. Pulmonic valve regurgitation is not visualized. No evidence of pulmonic stenosis. Aorta: The aortic  root is normal in size and structure. Venous: The inferior vena cava is normal in size with greater than 50% respiratory variability, suggesting right atrial pressure of 3 mmHg. IAS/Shunts: No atrial level shunt detected by color flow Doppler.  LEFT VENTRICLE PLAX 2D LVIDd:         3.70 cm     Diastology LVIDs:         2.50 cm     LV e' medial:    7.94 cm/s LV PW:         1.30 cm     LV E/e' medial:  10.8 LV IVS:        1.30 cm     LV e' lateral:   10.70 cm/s LVOT diam:     2.20 cm     LV E/e' lateral: 8.0 LV SV:         97 LV SV Index:   58 LVOT Area:     3.80 cm  LV Volumes (MOD) LV vol d, MOD A2C: 66.2 ml LV vol d, MOD A4C: 66.9 ml LV vol s, MOD A2C: 17.3 ml LV vol s, MOD A4C: 23.6 ml LV SV MOD A2C:     48.9 ml LV SV MOD A4C:     66.9 ml LV SV MOD BP:      49.2 ml RIGHT VENTRICLE             IVC RV S prime:     10.90 cm/s  IVC diam: 1.90 cm TAPSE (M-mode): 1.6 cm LEFT ATRIUM             Index        RIGHT ATRIUM           Index LA diam:        3.30 cm 1.98 cm/m   RA Area:     12.90 cm LA Vol (A2C):   47.8 ml 28.66 ml/m  RA Volume:   27.00 ml  16.19 ml/m LA Vol (A4C):   45.1 ml 27.04 ml/m LA Biplane Vol: 55.2 ml 33.10 ml/m  AORTIC VALVE LVOT Vmax:   126.00 cm/s LVOT Vmean:  79.400 cm/s LVOT VTI:    0.256 m  AORTA Ao Root diam: 3.40 cm Ao Asc diam:  3.50 cm MITRAL VALVE MV Area (PHT): 3.21 cm    SHUNTS MV Decel Time: 236 msec    Systemic VTI:  0.26 m MV E velocity: 85.40 cm/s  Systemic Diam: 2.20 cm MV A velocity: 87.75 cm/s MV E/A ratio:  0.97 Kirk Ruths MD Electronically signed by Kirk Ruths MD Signature Date/Time: 07/27/2022/1:12:46 PM    Final    DG  CHEST PORT 1 VIEW  Result Date: 07/27/2022 CLINICAL DATA:  Hypoxia EXAM: PORTABLE CHEST 1 VIEW COMPARISON:  Portable exam 1034 hours compared to 07/23/2022 FINDINGS: Enlargement of cardiac silhouette. Mediastinal contours and pulmonary vascularity normal. Atherosclerotic calcification aorta. Increased atelectasis versus consolidation of LEFT lower lobe. Persistent LEFT upper lobe infiltrate has slightly increased atelectasis. Enlargement of RIGHT hilum seen, at least a portion of which is due to enlarged pulmonary artery. RIGHT lung otherwise clear, underlying emphysematous changes noted. No pleural effusion or pneumothorax. Bones demineralized. IMPRESSION: Increased atelectasis versus consolidation of LEFT lower lobe. Persistent LEFT upper lobe infiltrate and slightly increased atelectasis. Emphysematous changes with enlargement of RIGHT pulmonary hilum, probably due to enlarged central pulmonary artery; follow-up upright PA and lateral chest radiographs recommended when patient's condition permits to further evaluate. Electronically Signed   By: Elta Guadeloupe  Thornton Papas M.D.   On: 07/27/2022 10:51   CT L-SPINE NO CHARGE  Result Date: 07/23/2022 CLINICAL DATA:  Low back pain. EXAM: CT LUMBAR SPINE WITHOUT CONTRAST TECHNIQUE: Multidetector CT imaging of the lumbar spine was performed without intravenous contrast administration. Multiplanar CT image reconstructions were also generated. RADIATION DOSE REDUCTION: This exam was performed according to the departmental dose-optimization program which includes automated exposure control, adjustment of the mA and/or kV according to patient size and/or use of iterative reconstruction technique. COMPARISON:  None Available. FINDINGS: Segmentation: 5 lumbar type vertebrae. Alignment: Mild anterolisthesis of L4 Vertebrae: No acute fracture or focal pathologic process. Osteopenia. Paraspinal and other soft tissues: Negative. Disc levels: T12-L1: Disc height loss and mild spinal canal  stenosis. L1-L2: No significant disc bulge, spinal canal or neural foraminal stenosis. L2-L3: Disc bulge with moderate bilateral lateral recess stenosis no significant neural foraminal stenosis L3-L4: Disc bulge with moderate bilateral lateral recess stenosis. No significant neural foraminal stenosis. Mild facet joint arthropathy L4-L5: Broad-based disc bulge with severe spinal canal and lateral recess stenosis bilaterally. Mild bilateral neural foraminal stenosis L5-S1: Disc bulge with mild bilateral lateral recess stenosis. No significant neural foraminal stenosis. Mild bilateral facet joint arthropathy IMPRESSION: 1. No acute fracture or subluxation. 2. 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 3. Multilevel facet joint arthropathy. Electronically Signed   By: Keane Police D.O.   On: 07/23/2022 23:43   CT ABDOMEN PELVIS W CONTRAST  Result Date: 07/23/2022 CLINICAL DATA:  Abdominal pain, acute EXAM: CT ABDOMEN AND PELVIS WITH CONTRAST TECHNIQUE: Multidetector CT imaging of the abdomen and pelvis was performed using the standard protocol following bolus administration of intravenous contrast. RADIATION DOSE REDUCTION: This exam was performed according to the departmental dose-optimization program which includes automated exposure control, adjustment of the mA and/or kV according to patient size and/or use of iterative reconstruction technique. CONTRAST:  27m OMNIPAQUE IOHEXOL 350 MG/ML SOLN COMPARISON:  Examination dated August 02, 2015 FINDINGS: Lower chest: No acute abnormality.  Left basilar atelectasis. Hepatobiliary: No focal liver abnormality is seen. Status post cholecystectomy. No biliary dilatation. Pancreas: Unremarkable. No pancreatic ductal dilatation or surrounding inflammatory changes. Spleen: Normal in size without focal abnormality. Adrenals/Urinary Tract: Adrenal glands are unremarkable. Multiple punctate bilateral renal calculi  without evidence of hydronephrosis or ureteral calculus. No evidence of pyelonephritis or perinephric fat stranding. Bladder is unremarkable. Stomach/Bowel: Stomach is within normal limits. Appendix appears normal. No evidence of bowel wall thickening, distention, or inflammatory changes. Vascular/Lymphatic: Prominent aortic atherosclerosis of the infrarenal abdominal aorta, which is however normal in caliber. No enlarged abdominal or pelvic lymph nodes. 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 No adnexal mass Other: No abdominal wall hernia or abnormality. No abdominopelvic ascites. Musculoskeletal: Mild multilevel degenerate disc disease of the lumbar spine. No acute osseous abnormality. IMPRESSION: 1. No CT evidence of acute abdominal/pelvic process. 2. Multiple punctate bilateral renal calculi without evidence of hydronephrosis or ureteral calculus. 3. Normal appendix. No evidence of colitis or diverticulitis. 4. 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 5. 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 6. Aortic atherosclerosis. Electronically Signed   By: IKeane PoliceD.O.   On: 07/23/2022 23:33   DG Chest Portable 1 View  Result Date: 07/23/2022 CLINICAL DATA:  Chest pain. EXAM: PORTABLE CHEST 1 VIEW COMPARISON:  08/02/2015. FINDINGS: The heart size and mediastinal contours are within normal limits. There  is atherosclerotic calcification of the aorta. Focal airspace disease is noted in the left upper lobe. Interstitial prominence is present at the lung bases bilaterally. No effusion or pneumothorax. No acute osseous abnormality. IMPRESSION: Focal airspace disease in the left upper lobe with interstitial prominence at the lung bases bilaterally, suspicious for pneumonia. Electronically Signed   By: Brett Fairy M.D.   On: 07/23/2022 20:20    Microbiology: Results for orders placed  or performed during the hospital encounter of 07/23/22  Resp panel by RT-PCR (RSV, Flu A&B, Covid) Anterior Nasal Swab     Status: None   Collection Time: 07/23/22  8:32 PM   Specimen: Anterior Nasal Swab  Result Value Ref Range Status   SARS Coronavirus 2 by RT PCR NEGATIVE NEGATIVE Final   Influenza A by PCR NEGATIVE NEGATIVE Final   Influenza B by PCR NEGATIVE NEGATIVE Final    Comment: (NOTE) The Xpert Xpress SARS-CoV-2/FLU/RSV plus assay is intended as an aid in the diagnosis of influenza from Nasopharyngeal swab specimens and should not be used as a sole basis for treatment. Nasal washings and aspirates are unacceptable for Xpert Xpress SARS-CoV-2/FLU/RSV testing.  Fact Sheet for Patients: EntrepreneurPulse.com.au  Fact Sheet for Healthcare Providers: IncredibleEmployment.be  This test is not yet approved or cleared by the [place] FDA and has been authorized for detection and/or diagnosis of SARS-CoV-2 by FDA under an Emergency Use Authorization (EUA). This EUA will remain in effect (meaning this test can be used) for the duration of the COVID-19 declaration under Section 564(b)(1) of the Act, 21 U.S.C. section 360bbb-3(b)(1), unless the authorization is terminated or revoked.     Resp Syncytial Virus by PCR NEGATIVE NEGATIVE Final    Comment: (NOTE) Fact Sheet for Patients: EntrepreneurPulse.com.au  Fact Sheet for Healthcare Providers: IncredibleEmployment.be  This test is not yet approved or cleared by the [place] FDA and has been authorized for detection and/or diagnosis of SARS-CoV-2 by FDA under an Emergency Use Authorization (EUA). This EUA will remain in effect (meaning this test can be used) for the duration of the COVID-19 declaration under Section 564(b)(1) of the Act, 21 U.S.C. section 360bbb-3(b)(1), unless the authorization is terminated or revoked.  Performed at [facility], [place] [address] [place], [place] [address]   Blood culture (routine x 2)     Status: None   Collection Time: 07/23/22 11:00 PM   Specimen: BLOOD  Result Value Ref Range Status   Specimen Description BLOOD LEFT ANTECUBITAL  Final   Special Requests   Final    BOTTLES DRAWN AEROBIC AND ANAEROBIC Blood Culture results may not be optimal due to an excessive volume of blood received in culture bottles   Culture   Final    NO GROWTH 5 DAYS Performed at [facility], [place] [address], [place], [place] [address]    Report Status 07/28/2022 FINAL  Final  Blood culture (routine x 2)     Status: None   Collection Time: 07/23/22 11:13 PM   Specimen: BLOOD LEFT FOREARM  Result Value Ref Range Status   Specimen Description BLOOD LEFT FOREARM  Final   Special Requests   Final    BOTTLES DRAWN AEROBIC AND ANAEROBIC Blood Culture adequate volume   Culture   Final    NO GROWTH 5 DAYS Performed at [facility], [place] [address], [place], [place] [address]    Report Status 07/28/2022 FINAL  Final    Labs: CBC: Recent Labs  Lab 07/23/22 2032 07/23/22 2039 07/24/22 0243 07/25/22 0726 07/26/22 0314 07/27/22 0351 07/28/22 0113  WBC 13.0*  --  11.4* 10.6* 11.5* 11.3* 11.0*  NEUTROABS 9.7*  --   --   --   --   --   --   HGB 12.2   < > 11.8* 12.0 11.4* 11.3* 11.3*  HCT 38.0   < > 37.3 37.8 34.9* 33.7* 34.0*  MCV 80.0  --  80.6 80.4 78.3* 77.8* 77.8*  PLT 426*  --  411* 371 373 338 367   < > = values in this interval not displayed.   Basic Metabolic Panel: Recent Labs  Lab 07/24/22 0243 07/25/22 0726 07/26/22 0314 07/27/22 0351 07/28/22 0113  NA 136 134* 132* 129* 132*  K 3.1* 3.9 3.9 3.8 3.9  CL 98 102 99 94* 95*  CO2 28 26 24 23 24  $ GLUCOSE 107* 119* 130* 133* 143*  BUN 8 6* 8 12 12  $ CREATININE 0.72 0.63 0.60 0.66 0.61  CALCIUM 8.7* 8.9 8.8* 8.7* 8.7*  MG  --  1.8  --  1.7  --   PHOS  --  3.6  --   --   --    Liver Function Tests: Recent Labs  Lab  07/23/22 2032 07/26/22 0314 07/27/22 0351 07/28/22 0113  AST 20 26 26 $ 79*  ALT 15 18 27 $ 68*  ALKPHOS 62 67 73 90  BILITOT 0.7 0.8 0.8 0.6  PROT 6.8 6.1* 6.2* 6.2*  ALBUMIN 3.5 2.9* 2.7* 2.6*   CBG: Recent Labs  Lab 07/27/22 1150 07/27/22 1616 07/27/22 2038 07/28/22 0436 07/28/22 1151  GLUCAP 173* 153* 147* 143* 126*    Discharge time spent: less than 30 minutes.  Signed: Marylu Lund, MD [facility] 07/28/2022

## 2022-07-28 NOTE — Progress Notes (Signed)
SATURATION QUALIFICATIONS: (This note is used to comply with regulatory documentation for home oxygen)  Patient Saturations on Room Air at Rest = 87%  Patient Saturations on Room Air while Ambulating = unable  Patient Saturations on 3 Liters of oxygen while Ambulating = 90%  Please briefly explain why patient needs home oxygen: Pt requires up to 3 L O2 to maintain SpO2 sats safely during ADLs/mobility

## 2022-07-28 NOTE — Progress Notes (Addendum)
Occupational Therapy Treatment Patient Details Name: Rhonda Blackwell MRN: [id number] DOB: [birth year] Today's Date: 07/28/2022   History of present illness 72 y.o. female admitted 2/4 with mild sepsis, community acquired pneumonia, and RUQ pain. 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   OT comments  Pt inquiring about OOB activities in hopes to progress abilities and discharge home soon. Pt requires Mod A for bed mobility, Min A for functional transfers and min guard for mobility using RW. Pt notably SOB w/ mobility into hallway requiring cues for rest breaks and breathing techniques. Attempted mobility on 2 L O2 with desat to 85% and required increase to 3 L O2 to maintain at 90%. Extended time spent educating pt/spouse re: energy conservation, recs for wheelchair, appropriate SpO2 sats (encouraged obtaining pulse ox for home use), and recs for purchasing bedrail attachment online.    Recommendations for follow up therapy are one component of a multi-disciplinary discharge planning process, led by the attending physician.  Recommendations may be updated based on patient status, additional functional criteria and insurance authorization.    Follow Up Recommendations  Home health OT     Assistance Recommended at Discharge Frequent or constant Supervision/Assistance  Patient can return home with the following  A little help with walking and/or transfers;A lot of help with bathing/dressing/bathroom;Assistance with cooking/housework;Assist for transportation;Help with stairs or ramp for entrance   Equipment Recommendations  Wheelchair cushion (measurements OT);Wheelchair (measurements OT);Other (comment) (bedrail attachments; husband aware of where to look for these online)    Recommendations for Other Services      Precautions / Restrictions Precautions Precautions: Fall Precaution Comments: Monitor  O2 Restrictions Weight Bearing Restrictions: No       Mobility Bed Mobility Overal bed mobility: Needs Assistance Bed Mobility: Supine to Sit     Supine to sit: Mod assist, HOB elevated     General bed mobility comments: assist to lift trunk, using bedrails. Husband also asked how he could help her so encouraged him to jump in, assist around trunk, let her pull and cued for his own body mechanics    Transfers Overall transfer level: Needs assistance Equipment used: Rolling walker (2 wheels), 1 person hand held assist Transfers: Sit to/from Stand, Bed to chair/wheelchair/BSC Sit to Stand: Min guard     Step pivot transfers: Min assist     General transfer comment: multiple stands at bedside with RW and without in prep for walk; Min A for handheld transfer to [facility] at end of session     Balance Overall balance assessment: Needs assistance Sitting-balance support: No upper extremity supported, Feet supported Sitting balance-Leahy Scale: Good     Standing balance support: During functional activity, Bilateral upper extremity supported Standing balance-Leahy Scale: Poor                             ADL either performed or assessed with clinical judgement   ADL Overall ADL's : Needs assistance/impaired                         Toilet Transfer: Minimal assistance;Stand-pivot;BSC/3in1 Toilet Transfer Details (indicate cue type and reason): handheld assist at end of session to turn to [place] ADL Comments: Emphasis on energy conservation, consideration of purchasing bedrail attachement for home use (showed husband on [place] and  pt reports having a [employer] giftcard), monitoring O2. Discussed w/c recs for out of the house use, to/from appts and use on "bad days"    Extremity/Trunk Assessment Upper Extremity Assessment Upper Extremity Assessment: Overall WFL for tasks assessed   Lower Extremity Assessment Lower Extremity Assessment:  Defer to PT evaluation        Vision   Vision Assessment?: No apparent visual deficits   Perception     Praxis      Cognition Arousal/Alertness: Awake/alert Behavior During Therapy: WFL for tasks assessed/performed Overall Cognitive Status: Within Functional Limits for tasks assessed                                          Exercises      Shoulder Instructions       General Comments Husband present and supportive    Pertinent Vitals/ Pain       Pain Assessment Pain Assessment: No/denies pain  Home Living                                          Prior Functioning/Environment              Frequency  Min 2X/week        Progress Toward Goals  OT Goals(current goals can now be found in the care plan section)  Progress towards OT goals: Progressing toward goals  Acute Rehab OT Goals Patient Stated Goal: home soon OT Goal Formulation: With patient Time For Goal Achievement: 08/09/22 Potential to Achieve Goals: Good ADL Goals Pt Will Perform Grooming: with modified independence;standing Pt Will Perform Tub/Shower Transfer: Tub transfer;rolling walker;ambulating;shower seat;with supervision Additional ADL Goal #1: Pt will walk to bathroom and complete all toileting with mod I. Additional ADL Goal #2: Pt will state and implement three things during adl routine she can do to save energy with min cues.  Plan Discharge plan remains appropriate    Co-evaluation                 AM-PAC OT "6 Clicks" Daily Activity     Outcome Measure   Help from another person eating meals?: None Help from another person taking care of personal grooming?: None Help from another person toileting, which includes using toliet, bedpan, or urinal?: A Little Help from another person bathing (including washing, rinsing, drying)?: A Lot Help from another person to put on and taking off regular upper body clothing?: A Little Help from  another person to put on and taking off regular lower body clothing?: A Lot 6 Click Score: 18    End of Session Equipment Utilized During Treatment: Rolling walker (2 wheels);Gait belt;Oxygen  OT Visit Diagnosis: Unsteadiness on feet (R26.81);Pain   Activity Tolerance Patient tolerated treatment well   Patient Left with call bell/phone within reach;with family/visitor present;with nursing/sitter in room;Other (comment) (on [facility]; NT present and aware)   Nurse Communication Mobility status        Time: [id number] OT Time Calculation (min): 33 min  Charges: OT General Charges $OT Visit: 1 Visit OT Treatments $Self Care/Home Management : 8-22 mins $Therapeutic Activity: 8-22 mins  Malachy Chamber, OTR/L Acute Rehab Services Office: [phone]   Layla Maw 07/28/2022, 12:14 PM

## 2022-07-28 NOTE — TOC Transition Note (Signed)
Transition of Care (TOC) - CM/SW Discharge Note Marvetta Gibbons RN, BSN Transitions of Care Unit 4E- RN Case Manager See Treatment Team for direct phone #   Patient Details  Name: Rhonda Blackwell MRN: [id number] Date of Birth: 10-30-1950  Transition of Care [facility]) CM/SW Contact:  Dawayne Patricia, RN Phone Number: 07/28/2022, 2:36 PM   Clinical Narrative:    Pt stable for transition home today, Notified that pt will need home 02 and wheelchair.   Cm spoke with pt at bedside- discussed DME and home 02 needs- pt asking if Wheelchair could be delivered to home- will have w/c and home 02 delivered together to home later today, portable 02 to transport home will be brought to room prior to discharge- pt agreeable to this. Choice offered for provider which she voiced she does not have a preference.   Confirmed HH provider as Al[facility] Lemmings- referral previously sent per CM notes. - Orders in for RN/PT/OT/aide.   Pt voiced her spouse will transport home.   Call made to [facility] with [employer] for DME needs- [employer] to deliver portable 02 to hospital for transport and will deliver w/c and home 02 later today to home.   Barb Merino notified of discharge for start of care needs.    Final next level of care: Home w Home Health Services Barriers to Discharge: Barriers Resolved   Patient Goals and CMS Choice CMS Medicare.gov Compare Post Acute Care list provided to:: Patient Choice offered to / list presented to : Patient, Spouse  Discharge Placement                 Home w/ [facility]        Discharge Plan and Services Additional resources added to the After Visit Summary for     Discharge Planning Services: CM Consult Post Acute Care Choice: Home Health          DME Arranged: Oxygen, Wheelchair manual DME Agency: [employer] Date DME Agency Contacted: 07/28/22 Time DME Agency Contacted: [id number] Representative spoke with at DME Agency: Brenton Grills HH Arranged: PT, OT, RN, Disease Management,  Nurse's Aide [place] Agency: [place] Date [place]: 07/25/22 Time [place]: 1414 Representative spoke with at [place]: [place] Determinants of Health ([place]) Interventions SDOH Screenings   Food Insecurity: No Food Insecurity (07/24/2022)  Housing: Low Risk  (07/24/2022)  Transportation Needs: No Transportation Needs (07/24/2022)  Utilities: Not At Risk (07/24/2022)  Tobacco Use: High Risk (07/24/2022)     Readmission Risk Interventions    07/28/2022    2:35 PM  Readmission Risk Prevention Plan  Transportation Screening Complete  PCP or Specialist Appt within 5-7 Days Complete  Home Care Screening Complete  Medication Review (RN CM) Complete

## 2022-07-28 NOTE — Discharge Instructions (Signed)

## 2022-07-28 NOTE — Progress Notes (Cosign Needed)
   Durable Medical Equipment (From admission, onward)        Start     Ordered  07/28/22 1444  For home use only DME standard manual wheelchair with seat cushion  Once      Comments: Patient suffers from emphysema which impairs their ability to perform daily activities like dressing in the home.  A crutch will not resolve issue with performing activities of daily living. A wheelchair will allow patient to safely perform daily activities. Patient can safely propel the wheelchair in the home or has a caregiver who can provide assistance. Length of need 6 months . Accessories: elevating leg rests (ELRs), wheel locks, extensions and anti-tippers.  07/28/22 1443  07/28/22 1356  For home use only DME oxygen  Once      Question Answer Comment Length of Need 6 Months  Mode or (Route) Nasal cannula  Liters per Minute 2  Frequency Continuous (stationary and portable oxygen unit needed)  Oxygen delivery system Gas    07/28/22 1356

## 2022-07-28 NOTE — Progress Notes (Signed)
PT Cancellation Note  Patient Details Name: Rhonda Blackwell MRN: [id number] DOB: 1950/06/28   Cancelled Treatment:    Reason Eval/Treat Not Completed: Patient declined, no reason specified. Pt adamantly refusing all mobility stating she just wants to go home. Explained need to check SpO2 levels with mobility. Pt has already called husband to pick her up. Explained that there is not a DC order yet in chart. Pt continued to refuse. With O2 turned off SpO2 down to 87% at rest.   [place] 07/28/2022, 9:11 AM [place] Office [phone]

## 2022-07-28 NOTE — Progress Notes (Signed)
Pt discharged home with husband.  O2 and TOC meds delivered to room.  All instructions given and reviewed, all questions answered.    Of note, Pt had extreme difficulty getting into vehicle, requiring max 2 assist, and 2 attempts.

## 2022-08-29 ENCOUNTER — Inpatient Hospital Stay (HOSPITAL_COMMUNITY)
Admission: EM | Admit: 2022-08-29 | Discharge: 2022-09-14 | DRG: 166 | Disposition: A | Discharge status: Skilled Nursing Facility | Payer: Medicare Other | Attending: Internal Medicine | Admitting: Internal Medicine

## 2022-08-29 ENCOUNTER — Emergency Department (HOSPITAL_COMMUNITY): Payer: Medicare Other

## 2022-08-29 ENCOUNTER — Encounter (HOSPITAL_COMMUNITY): Payer: Self-pay

## 2022-08-29 DIAGNOSIS — F17210 Nicotine dependence, cigarettes, uncomplicated: Secondary | ICD-10-CM | POA: Diagnosis present

## 2022-08-29 DIAGNOSIS — J449 Chronic obstructive pulmonary disease, unspecified: Secondary | ICD-10-CM | POA: Diagnosis not present

## 2022-08-29 DIAGNOSIS — J90 Pleural effusion, not elsewhere classified: Secondary | ICD-10-CM | POA: Diagnosis not present

## 2022-08-29 DIAGNOSIS — C7951 Secondary malignant neoplasm of bone: Secondary | ICD-10-CM | POA: Diagnosis present

## 2022-08-29 DIAGNOSIS — M8448XA Pathological fracture, other site, initial encounter for fracture: Secondary | ICD-10-CM | POA: Diagnosis present

## 2022-08-29 DIAGNOSIS — I480 Paroxysmal atrial fibrillation: Secondary | ICD-10-CM | POA: Insufficient documentation

## 2022-08-29 DIAGNOSIS — F419 Anxiety disorder, unspecified: Secondary | ICD-10-CM

## 2022-08-29 DIAGNOSIS — R918 Other nonspecific abnormal finding of lung field: Secondary | ICD-10-CM

## 2022-08-29 DIAGNOSIS — J189 Pneumonia, unspecified organism: Secondary | ICD-10-CM | POA: Diagnosis present

## 2022-08-29 DIAGNOSIS — Z8701 Personal history of pneumonia (recurrent): Secondary | ICD-10-CM

## 2022-08-29 DIAGNOSIS — R0902 Hypoxemia: Principal | ICD-10-CM

## 2022-08-29 DIAGNOSIS — Z888 Allergy status to other drugs, medicaments and biological substances status: Secondary | ICD-10-CM

## 2022-08-29 DIAGNOSIS — C7802 Secondary malignant neoplasm of left lung: Secondary | ICD-10-CM | POA: Diagnosis present

## 2022-08-29 DIAGNOSIS — Z823 Family history of stroke: Secondary | ICD-10-CM

## 2022-08-29 DIAGNOSIS — Z7189 Other specified counseling: Secondary | ICD-10-CM

## 2022-08-29 DIAGNOSIS — I4819 Other persistent atrial fibrillation: Secondary | ICD-10-CM | POA: Diagnosis not present

## 2022-08-29 DIAGNOSIS — J810 Acute pulmonary edema: Secondary | ICD-10-CM | POA: Diagnosis present

## 2022-08-29 DIAGNOSIS — C3492 Malignant neoplasm of unspecified part of left bronchus or lung: Secondary | ICD-10-CM | POA: Diagnosis not present

## 2022-08-29 DIAGNOSIS — R590 Localized enlarged lymph nodes: Secondary | ICD-10-CM | POA: Diagnosis present

## 2022-08-29 DIAGNOSIS — I4891 Unspecified atrial fibrillation: Secondary | ICD-10-CM | POA: Diagnosis not present

## 2022-08-29 DIAGNOSIS — E871 Hypo-osmolality and hyponatremia: Secondary | ICD-10-CM | POA: Diagnosis not present

## 2022-08-29 DIAGNOSIS — J910 Malignant pleural effusion: Secondary | ICD-10-CM | POA: Diagnosis present

## 2022-08-29 DIAGNOSIS — C50919 Malignant neoplasm of unspecified site of unspecified female breast: Secondary | ICD-10-CM | POA: Diagnosis present

## 2022-08-29 DIAGNOSIS — J9811 Atelectasis: Secondary | ICD-10-CM | POA: Diagnosis present

## 2022-08-29 DIAGNOSIS — Z79899 Other long term (current) drug therapy: Secondary | ICD-10-CM

## 2022-08-29 DIAGNOSIS — G893 Neoplasm related pain (acute) (chronic): Secondary | ICD-10-CM | POA: Diagnosis present

## 2022-08-29 DIAGNOSIS — Z1152 Encounter for screening for COVID-19: Secondary | ICD-10-CM

## 2022-08-29 DIAGNOSIS — Z7984 Long term (current) use of oral hypoglycemic drugs: Secondary | ICD-10-CM

## 2022-08-29 DIAGNOSIS — C3402 Malignant neoplasm of left main bronchus: Secondary | ICD-10-CM

## 2022-08-29 DIAGNOSIS — E119 Type 2 diabetes mellitus without complications: Secondary | ICD-10-CM | POA: Diagnosis not present

## 2022-08-29 DIAGNOSIS — R079 Chest pain, unspecified: Secondary | ICD-10-CM | POA: Diagnosis not present

## 2022-08-29 DIAGNOSIS — I639 Cerebral infarction, unspecified: Secondary | ICD-10-CM

## 2022-08-29 DIAGNOSIS — Z539 Procedure and treatment not carried out, unspecified reason: Secondary | ICD-10-CM | POA: Diagnosis not present

## 2022-08-29 DIAGNOSIS — J440 Chronic obstructive pulmonary disease with acute lower respiratory infection: Secondary | ICD-10-CM | POA: Diagnosis present

## 2022-08-29 DIAGNOSIS — J441 Chronic obstructive pulmonary disease with (acute) exacerbation: Secondary | ICD-10-CM | POA: Diagnosis present

## 2022-08-29 DIAGNOSIS — D509 Iron deficiency anemia, unspecified: Secondary | ICD-10-CM | POA: Diagnosis present

## 2022-08-29 DIAGNOSIS — C3490 Malignant neoplasm of unspecified part of unspecified bronchus or lung: Secondary | ICD-10-CM | POA: Diagnosis present

## 2022-08-29 DIAGNOSIS — R970 Elevated carcinoembryonic antigen [CEA]: Secondary | ICD-10-CM | POA: Diagnosis not present

## 2022-08-29 DIAGNOSIS — Z8673 Personal history of transient ischemic attack (TIA), and cerebral infarction without residual deficits: Secondary | ICD-10-CM | POA: Diagnosis not present

## 2022-08-29 DIAGNOSIS — R0602 Shortness of breath: Secondary | ICD-10-CM | POA: Diagnosis not present

## 2022-08-29 DIAGNOSIS — Z7951 Long term (current) use of inhaled steroids: Secondary | ICD-10-CM

## 2022-08-29 DIAGNOSIS — Z720 Tobacco use: Secondary | ICD-10-CM

## 2022-08-29 DIAGNOSIS — Z515 Encounter for palliative care: Secondary | ICD-10-CM

## 2022-08-29 DIAGNOSIS — R933 Abnormal findings on diagnostic imaging of other parts of digestive tract: Secondary | ICD-10-CM | POA: Diagnosis not present

## 2022-08-29 DIAGNOSIS — I10 Essential (primary) hypertension: Secondary | ICD-10-CM | POA: Diagnosis not present

## 2022-08-29 DIAGNOSIS — I6389 Other cerebral infarction: Secondary | ICD-10-CM | POA: Diagnosis not present

## 2022-08-29 DIAGNOSIS — J9621 Acute and chronic respiratory failure with hypoxia: Secondary | ICD-10-CM | POA: Diagnosis not present

## 2022-08-29 DIAGNOSIS — Z794 Long term (current) use of insulin: Secondary | ICD-10-CM | POA: Diagnosis not present

## 2022-08-29 DIAGNOSIS — Z7901 Long term (current) use of anticoagulants: Secondary | ICD-10-CM

## 2022-08-29 DIAGNOSIS — C3412 Malignant neoplasm of upper lobe, left bronchus or lung: Secondary | ICD-10-CM | POA: Diagnosis not present

## 2022-08-29 DIAGNOSIS — Z8619 Personal history of other infectious and parasitic diseases: Secondary | ICD-10-CM

## 2022-08-29 DIAGNOSIS — Z923 Personal history of irradiation: Secondary | ICD-10-CM

## 2022-08-29 DIAGNOSIS — Z9221 Personal history of antineoplastic chemotherapy: Secondary | ICD-10-CM

## 2022-08-29 DIAGNOSIS — I959 Hypotension, unspecified: Secondary | ICD-10-CM | POA: Diagnosis present

## 2022-08-29 DIAGNOSIS — M25519 Pain in unspecified shoulder: Secondary | ICD-10-CM | POA: Diagnosis not present

## 2022-08-29 DIAGNOSIS — Z91199 Patient's noncompliance with other medical treatment and regimen due to unspecified reason: Secondary | ICD-10-CM

## 2022-08-29 DIAGNOSIS — I2721 Secondary pulmonary arterial hypertension: Secondary | ICD-10-CM | POA: Diagnosis present

## 2022-08-29 DIAGNOSIS — M8458XA Pathological fracture in neoplastic disease, other specified site, initial encounter for fracture: Secondary | ICD-10-CM | POA: Diagnosis present

## 2022-08-29 DIAGNOSIS — E876 Hypokalemia: Secondary | ICD-10-CM | POA: Diagnosis not present

## 2022-08-29 DIAGNOSIS — R002 Palpitations: Secondary | ICD-10-CM | POA: Diagnosis not present

## 2022-08-29 DIAGNOSIS — Z833 Family history of diabetes mellitus: Secondary | ICD-10-CM

## 2022-08-29 LAB — COMPREHENSIVE METABOLIC PANEL
ALT: 12 U/L (ref 0–44)
AST: 18 U/L (ref 15–41)
Albumin: 3.2 g/dL — ABNORMAL LOW (ref 3.5–5.0)
Alkaline Phosphatase: 97 U/L (ref 38–126)
Anion gap: 16 — ABNORMAL HIGH (ref 5–15)
BUN: 10 mg/dL (ref 8–23)
CO2: 23 mmol/L (ref 22–32)
Calcium: 9.1 mg/dL (ref 8.9–10.3)
Chloride: 97 mmol/L — ABNORMAL LOW (ref 98–111)
Creatinine, Ser: 0.9 mg/dL (ref 0.44–1.00)
GFR, Estimated: >60 mL/min (ref >60)
Glucose, Bld: 146 mg/dL — ABNORMAL HIGH (ref 70–99)
Potassium: 3.8 mmol/L (ref 3.5–5.1)
Sodium: 136 mmol/L (ref 135–145)
Total Bilirubin: 0.7 mg/dL (ref 0.3–1.2)
Total Protein: 7.1 g/dL (ref 6.5–8.1)

## 2022-08-29 LAB — CBC WITH DIFFERENTIAL/PLATELET
Abs Immature Granulocytes: 0.06 10*3/uL (ref 0.00–0.07)
Basophils Absolute: 0.1 10*3/uL (ref 0.0–0.1)
Basophils Relative: 1 %
Eosinophils Absolute: 0 10*3/uL (ref 0.0–0.5)
Eosinophils Relative: 0 %
HCT: 38.2 % (ref 36.0–46.0)
Hemoglobin: 12.2 g/dL (ref 12.0–15.0)
Immature Granulocytes: 1 %
Lymphocytes Relative: 13 %
Lymphs Abs: 1.7 10*3/uL (ref 0.7–4.0)
MCH: 25.4 pg — ABNORMAL LOW (ref 26.0–34.0)
MCHC: 31.9 g/dL (ref 30.0–36.0)
MCV: 79.4 fL — ABNORMAL LOW (ref 80.0–100.0)
Monocytes Absolute: 0.8 10*3/uL (ref 0.1–1.0)
Monocytes Relative: 6 %
Neutro Abs: 9.9 10*3/uL — ABNORMAL HIGH (ref 1.7–7.7)
Neutrophils Relative %: 79 %
Platelets: 575 10*3/uL — ABNORMAL HIGH (ref 150–400)
RBC: 4.81 MIL/uL (ref 3.87–5.11)
RDW: 14 % (ref 11.5–15.5)
WBC: 12.5 10*3/uL — ABNORMAL HIGH (ref 4.0–10.5)
nRBC: 0 % (ref 0.0–0.2)

## 2022-08-29 LAB — CBG MONITORING, ED
Glucose-Capillary: 130 mg/dL — ABNORMAL HIGH (ref 70–99)
Glucose-Capillary: 153 mg/dL — ABNORMAL HIGH (ref 70–99)

## 2022-08-29 LAB — RESP PANEL BY RT-PCR (RSV, FLU A&B, COVID)  RVPGX2
Influenza A by PCR: NEGATIVE
Influenza B by PCR: NEGATIVE
Resp Syncytial Virus by PCR: NEGATIVE
SARS Coronavirus 2 by RT PCR: NEGATIVE

## 2022-08-29 LAB — TROPONIN I (HIGH SENSITIVITY)
Troponin I (High Sensitivity): 30 ng/L — ABNORMAL HIGH (ref <18)
Troponin I (High Sensitivity): 79 ng/L — ABNORMAL HIGH (ref <18)

## 2022-08-29 MED ORDER — ACETAMINOPHEN 325 MG PO TABS
650.0000 mg | ORAL_TABLET | Freq: Three times a day (TID) | ORAL | Status: DC | PRN
  Administered 2022-08-30 – 2022-08-31 (×4): 650 mg via ORAL
  Filled 2022-08-29 (×4): qty 2

## 2022-08-29 MED ORDER — ALBUTEROL SULFATE (2.5 MG/3ML) 0.083% IN NEBU
2.5000 mg | INHALATION_SOLUTION | Freq: Four times a day (QID) | RESPIRATORY_TRACT | Status: DC | PRN
  Administered 2022-08-31: 2.5 mg via RESPIRATORY_TRACT
  Filled 2022-08-29: qty 3

## 2022-08-29 MED ORDER — IOHEXOL 350 MG/ML SOLN
50.0000 mL | Freq: Once | INTRAVENOUS | Status: AC | PRN
  Administered 2022-08-29: 50 mL via INTRAVENOUS

## 2022-08-29 MED ORDER — IRBESARTAN 300 MG PO TABS
150.0000 mg | ORAL_TABLET | Freq: Two times a day (BID) | ORAL | Status: DC
  Administered 2022-08-30 – 2022-08-31 (×3): 150 mg via ORAL
  Filled 2022-08-29 (×3): qty 1

## 2022-08-29 MED ORDER — DILTIAZEM HCL ER COATED BEADS 120 MG PO CP24
120.0000 mg | ORAL_CAPSULE | Freq: Every day | ORAL | Status: DC
  Administered 2022-08-30 – 2022-08-31 (×2): 120 mg via ORAL
  Filled 2022-08-29 (×4): qty 1

## 2022-08-29 MED ORDER — ATORVASTATIN CALCIUM 40 MG PO TABS
40.0000 mg | ORAL_TABLET | Freq: Every day | ORAL | Status: DC
  Administered 2022-08-30 – 2022-08-31 (×2): 40 mg via ORAL
  Filled 2022-08-29 (×2): qty 1

## 2022-08-29 MED ORDER — PANTOPRAZOLE SODIUM 40 MG PO TBEC
40.0000 mg | DELAYED_RELEASE_TABLET | Freq: Every day | ORAL | Status: DC
  Administered 2022-08-30 – 2022-08-31 (×2): 40 mg via ORAL
  Filled 2022-08-29 (×2): qty 1

## 2022-08-29 MED ORDER — LABETALOL HCL 200 MG PO TABS: 100.0000 mg | ORAL_TABLET | Freq: Two times a day (BID) | ORAL | Status: DC

## 2022-08-29 MED ORDER — APIXABAN 5 MG PO TABS
5.0000 mg | ORAL_TABLET | Freq: Two times a day (BID) | ORAL | Status: DC
  Administered 2022-08-29 – 2022-08-31 (×4): 5 mg via ORAL
  Filled 2022-08-29 (×4): qty 1

## 2022-08-29 MED ORDER — LABETALOL HCL 100 MG PO TABS
100.0000 mg | ORAL_TABLET | Freq: Two times a day (BID) | ORAL | Status: DC
  Administered 2022-08-30 – 2022-08-31 (×3): 100 mg via ORAL
  Filled 2022-08-29 (×3): qty 1

## 2022-08-29 MED ORDER — FUROSEMIDE 20 MG PO TABS
20.0000 mg | ORAL_TABLET | Freq: Every day | ORAL | Status: DC
  Administered 2022-08-30 – 2022-08-31 (×2): 20 mg via ORAL
  Filled 2022-08-29 (×2): qty 1

## 2022-08-29 MED ORDER — DULOXETINE HCL 60 MG PO CPEP
60.0000 mg | ORAL_CAPSULE | Freq: Two times a day (BID) | ORAL | Status: DC
  Administered 2022-08-29 – 2022-08-31 (×4): 60 mg via ORAL
  Filled 2022-08-29 (×2): qty 1
  Filled 2022-08-29 (×2): qty 2

## 2022-08-29 MED ORDER — MOMETASONE FURO-FORMOTEROL FUM 200-5 MCG/ACT IN AERO
2.0000 | INHALATION_SPRAY | Freq: Two times a day (BID) | RESPIRATORY_TRACT | Status: DC
  Administered 2022-08-30 – 2022-08-31 (×2): 2 via RESPIRATORY_TRACT
  Filled 2022-08-29 (×2): qty 8.8

## 2022-08-29 MED ORDER — IRBESARTAN 300 MG PO TABS: 150.0000 mg | ORAL_TABLET | Freq: Two times a day (BID) | ORAL | Status: DC

## 2022-08-29 MED ORDER — FLUTICASONE PROPIONATE 50 MCG/ACT NA SUSP
2.0000 | Freq: Every day | NASAL | Status: DC
  Administered 2022-08-30: 2 via NASAL
  Filled 2022-08-29 (×2): qty 16

## 2022-08-29 MED ORDER — SODIUM CHLORIDE 0.9 % IV BOLUS
500.0000 mL | Freq: Once | INTRAVENOUS | Status: AC
  Administered 2022-08-29: 500 mL via INTRAVENOUS

## 2022-08-29 MED ORDER — INSULIN ASPART 100 UNIT/ML IJ SOLN
0.0000 [IU] | Freq: Three times a day (TID) | INTRAMUSCULAR | Status: DC
  Administered 2022-08-31 (×2): 1 [IU] via SUBCUTANEOUS

## 2022-08-29 MED ORDER — PIPERACILLIN-TAZOBACTAM 3.375 G IVPB
3.3750 g | Freq: Three times a day (TID) | INTRAVENOUS | Status: DC
  Administered 2022-08-29 – 2022-08-31 (×5): 3.375 g via INTRAVENOUS
  Filled 2022-08-29 (×7): qty 50

## 2022-08-29 NOTE — ED Provider Notes (Signed)
[facility] Provider Note   CSN: [id number] Arrival date & time: 08/29/22  1336     History {Add pertinent medical, surgical, social history, OB history to HPI:1} Chief Complaint  Patient presents with   Tachycardia    Rhonda Blackwell is a 72 y.o. female.  HPI  patient with hypotensive and diaphoretic.  Found to be tachycardic upon arrival.  Narrow complex tachycardia with recent history of A-fib.  Recent admission to the hospital for pneumonia.  Tachycardic upon arrival.  Reportedly was diaphoretic.  States her chest felt tight.   Past Medical History:  Diagnosis Date   Bronchitis    COPD (chronic obstructive pulmonary disease) ([place])    Diabetes mellitus without complication ([place])    Hypertension     Home Medications Prior to Admission medications   Medication Sig Start Date End Date Taking? Authorizing Provider  acetaminophen (TYLENOL) 650 MG CR tablet Take 650 mg by mouth every 8 (eight) hours as needed for pain.    [provider]  albuterol (PROVENTIL HFA;VENTOLIN HFA) 108 (90 BASE) MCG/ACT inhaler Inhale 2 puffs into the lungs every 6 (six) hours as needed for shortness of breath (cough). 05/28/15   Mesner, Corene Cornea, MD  apixaban (ELIQUIS) 5 MG TABS tablet Take 1 tablet (5 mg total) by mouth 2 (two) times daily. 07/28/22 08/27/22  Donne Hazel, MD  atorvastatin (LIPITOR) 40 MG tablet Take 40 mg by mouth daily.    [provider]  azithromycin (ZITHROMAX) 250 MG tablet Take 1 tablet (250 mg total) by mouth daily for 2 more days 07/28/22   Donne Hazel, MD  Calcium-Magnesium-Vitamin D (CALCIUM 1200+D3 PO) Take 1 capsule by mouth daily.    [provider]  diltiazem (CARDIZEM CD) 120 MG 24 hr capsule Take 1 capsule (120 mg total) by mouth daily. 07/29/22 08/28/22  Donne Hazel, MD  DM-Phenylephrine-Acetaminophen (VICKS DAYQUIL COLD & FLU) 10-5-325 MG CAPS Take 2 capsules by mouth every 6 (six) hours as needed  (for cough).    [provider]  DULoxetine (CYMBALTA) 60 MG capsule Take 60 mg by mouth 2 (two) times daily. 06/05/22   [provider]  fluticasone (FLONASE) 50 MCG/ACT nasal spray Place 2 sprays into both nostrils daily.    [provider]  fluticasone-salmeterol (ADVAIR) 250-50 MCG/ACT AEPB Inhale 1 puff into the lungs in the morning and at bedtime. 11/24/21   [provider]  furosemide (LASIX) 20 MG tablet Take 20 mg by mouth daily. 07/18/22   [provider]  irbesartan (AVAPRO) 150 MG tablet Take 150 mg by mouth 2 (two) times daily.    [provider]  labetalol (NORMODYNE) 100 MG tablet Take 100 mg by mouth 2 (two) times daily.    [provider]  Magnesium 250 MG TABS Take 500 mg by mouth daily.    [provider]  Multiple Vitamins-Minerals (ONE-A-DAY WOMENS 50 PLUS) TABS Take 1 tablet by mouth daily.    [provider]  Omega-3 Fatty Acids ([employer] FISH OIL HALF-THE-SIZE) 500 MG CAPS Take 500 mg by mouth daily.    [provider]  pantoprazole (PROTONIX) 40 MG tablet Take 40 mg by mouth daily.    [provider]  sitaGLIPtan-metformin (JANUMET) 50-500 MG per tablet Take 1 tablet by mouth 2 (two) times daily with a meal.    [provider]      Allergies    Hydrochlorothiazide, Amlodipine, and Nebivolol  Review of Systems   Review of Systems  Physical Exam Updated Vital Signs BP 107/61   Pulse 90   Temp 97.7 F (36.5 C) (Oral)   Resp 19   Ht '5\' 4"'$  (1.626 m)   Wt 65.8 kg   SpO2 92%   BMI 24.89 kg/m  Physical Exam Vitals and nursing note reviewed.  Cardiovascular:     Rate and Rhythm: Regular rhythm. Tachycardia present.  Pulmonary:     Breath sounds: No wheezing.  Abdominal:     Tenderness: There is no abdominal tenderness.  Musculoskeletal:        General: No tenderness.  Skin:    Capillary Refill: Capillary refill takes less than 2 seconds.  Neurological:      Mental Status: She is alert.     ED Results / Procedures / Treatments   Labs (all labs ordered are listed, but only abnormal results are displayed) Labs Reviewed  CBG MONITORING, ED - Abnormal; Notable for the following components:      Result Value   Glucose-Capillary 153 (*)    All other components within normal limits  RESP PANEL BY RT-PCR (RSV, FLU A&B, COVID)  [id number]  COMPREHENSIVE METABOLIC PANEL  CBC WITH DIFFERENTIAL/PLATELET  URINALYSIS, ROUTINE W REFLEX MICROSCOPIC  TROPONIN I (HIGH SENSITIVITY)  TROPONIN I (HIGH SENSITIVITY)    EKG EKG Interpretation  Date/Time:  Tuesday August 29 2022 14:06:11 EDT Ventricular Rate:  87 PR Interval:  179 QRS Duration: 90 QT Interval:  361 QTC Calculation: 435 R Axis:   119 Text Interpretation: Sinus rhythm Right axis deviation Confirmed by Davonna Belling [phone]) on 08/29/2022 2:07:57 PM  Radiology DG Chest Portable 1 View  Result Date: 08/29/2022 CLINICAL DATA:  Chest pain, diaphoresis EXAM: PORTABLE CHEST 1 VIEW COMPARISON:  Portable exam 1412 hours compared to 07/27/2022 FINDINGS: Normal heart size and pulmonary vascularity. Atherosclerotic calcification aorta. Enlargement of RIGHT hilum, questionably LEFT hilum. Emphysematous changes with increased opacity in LEFT upper lobe and BILATERAL lower lobe since previous study question infiltrate. No pleural effusion or pneumothorax. Bones demineralized. IMPRESSION: Increased pulmonary infiltrates question pneumonia. Persistent hilar enlargement mass/adenopathy not excluded; CT chest with contrast recommended for further assessment. Electronically Signed   By: Lavonia Dana M.D.   On: 08/29/2022 14:48    Procedures Procedures  {Document cardiac monitor, telemetry assessment procedure when appropriate:1}  Medications Ordered in ED Medications - No data to display  ED Course/ Medical Decision Making/ A&P   {   Click here for ABCD2, HEART and other calculatorsREFRESH Note before  signing :1}                          Medical Decision Making Amount and/or Complexity of Data Reviewed Radiology: ordered.  Patient with shortness of breath and tachycardia.  History of atrial flutter.  Appears to be on anticoagulation.  Found to be tachycardic upon arrival.  However spontaneously converted.  Will get basic blood work and chest x-ray.  Care will be turned over to Dr. Pearline Cables.   {Document critical care time when appropriate:1} {Document review of labs and clinical decision tools ie heart score, Chads2Vasc2 etc:1}  {Document your independent review of radiology images, and any outside records:1} {Document your discussion with family members, caretakers, and with consultants:1} {Document social determinants of health affecting pt's care:1} {Document your decision making why or why not admission, treatments were needed:1} Final Clinical Impression(s) / ED Diagnoses Final diagnoses:  None    Rx /  DC Orders ED Discharge Orders     None

## 2022-08-29 NOTE — Consult Note (Signed)
NAMEAlessandra Blackwell, MRN:  [id number], DOB:  Apr 18, 1951, LOS: 0 ADMISSION DATE:  08/29/2022, CONSULTATION DATE: 08/29/2022 REFERRING MD: Dr. Flossie Buffy, CHIEF COMPLAINT: Shortness of breath, hypoxemic respiratory failure  History of Present Illness:  Patient presented to the hospital with shortness of breath Cough bringing up yellow phlegm, was recently hospitalized early February for pneumonia, sepsis Has had some chest tightness  Known to have A-fib, on anticoagulation  History of COPD, diabetes, hypertension, bronchitis  Recently diagnosed with metastatic cancer from a biopsy of scapula lesion  Known to have a left lung mass, recently had a bronchoscopy in January-patient does not recollect being told about cancer being found from that procedure, notes did not reveal endobronchial lesion on the left side.  Patient was dismissed from oncology practice secondary to multiple missed appointments-has not seen a pulmonologist in a year  She uses oxygen about 2 and half liters at home, same with exertion Uses inhalers  Pertinent  Medical History   Past Medical History:  Diagnosis Date   Bronchitis    COPD (chronic obstructive pulmonary disease) ([place])    Diabetes mellitus without complication ([place])    Hypertension     Significant Hospital Events: Including procedures, antibiotic start and stop dates in addition to other pertinent events   CT reviewed-left upper lobe mass, with probable lymphangitic spread, mediastinal adenopathy PET/CT 05/17/2022-hypermetabolic left upper lobe nodule, hypermetabolic left hilar and mediastinal lymph nodes, hypermetabolic left scapular lesion, hypermetabolic lesion in acetabulum  Interim History / Subjective:  Cough, shortness of breath, some chest discomfort  Objective   Blood pressure (!) 153/124, pulse 80, temperature 98 F (36.7 C), temperature source Oral, resp. rate 19, height '5\' 4"'$  (1.626 m), weight 65.8 kg, SpO2 100 %.       No intake or output  data in the 24 hours ending 08/29/22 2111 Filed Weights   08/29/22 1350  Weight: 65.8 kg    Examination: General: Elderly, chronically ill-appearing HENT: Moist oral mucosa Lungs: Decreased air movement bilaterally Cardiovascular: S1-S2 appreciated Abdomen: Soft, bowel sounds appreciated Extremities: Finger clubbing Neuro: Alert and oriented x 3 GU:   Resolved Hospital Problem list     Assessment & Plan:  Metastatic lung cancer Progressive disease, metastasis to multiple bones -Noncompliance -Was discharged from oncology practice following missed appointments  Did have recent bronchoscopy -January 16 -Vascular lesion noted in bronchus intermedius-was not biopsied -Did not see the pathology results from the lymph node aspirations  COPD exacerbation Recent admission for pneumonia, sepsis -Bronchodilators -With concern for obstructive pneumonia, start on Zosyn -Sputum for Gram stain and cultures  Acute on chronic hypoxemic respiratory failure -Continue oxygen supplementation -Decrease as tolerated  Atrial fibrillation -On Eliquis.  Bronchoscopy can be planned for when patient is more stable Pulmonary will continue to follow  Will need to be seen by oncology at some point-metastatic lung cancer   Best Practice (right click and "Reselect all SmartList Selections" daily)   Diet/type: Regular consistency (see orders) DVT prophylaxis: SCD GI prophylaxis: N/A Lines: N/A Foley:  N/A Code Status:  full code Last date of multidisciplinary goals of care discussion [pending]  Labs   CBC: Recent Labs  Lab 08/29/22 1400  WBC 12.5*  NEUTROABS 9.9*  HGB 12.2  HCT 38.2  MCV 79.4*  PLT 575*    Basic Metabolic Panel: Recent Labs  Lab 08/29/22 1400  NA 136  K 3.8  CL 97*  CO2 23  GLUCOSE 146*  BUN 10  CREATININE 0.90  CALCIUM 9.1  GFR: Estimated Creatinine Clearance: 53.5 mL/min (by C-G formula based on SCr of 0.9 mg/dL). Recent Labs  Lab  08/29/22 1400  WBC 12.5*    Liver Function Tests: Recent Labs  Lab 08/29/22 1400  AST 18  ALT 12  ALKPHOS 97  BILITOT 0.7  PROT 7.1  ALBUMIN 3.2*   No results for input(s): "LIPASE", "AMYLASE" in the last 168 hours. No results for input(s): "AMMONIA" in the last 168 hours.  ABG    Component Value Date/Time   TCO2 26 07/23/2022 2039     Coagulation Profile: No results for input(s): "INR", "PROTIME" in the last 168 hours.  Cardiac Enzymes: No results for input(s): "CKTOTAL", "CKMB", "CKMBINDEX", "TROPONINI" in the last 168 hours.  HbA1C: Hgb A1c MFr Bld  Date/Time Value Ref Range Status  07/24/2022 12:45 AM 6.3 (H) 4.8 - 5.6 % Final    Comment:    (NOTE) Pre diabetes:          5.7%-6.4%  Diabetes:              >6.4%  Glycemic control for   <7.0% adults with diabetes     CBG: Recent Labs  Lab 08/29/22 1348  GLUCAP 153*    Review of Systems:   Failure to thrive, chest discomfort, cough  Past Medical History:  She,  has a past medical history of Bronchitis, COPD (chronic obstructive pulmonary disease) ([place]), Diabetes mellitus without complication ([place]), and Hypertension.   Surgical History:   Past Surgical History:  Procedure Laterality Date   CESAREAN SECTION     CHOLECYSTECTOMY     FOOT SURGERY     HERNIA REPAIR       Social History:   reports that she has been smoking cigarettes. She has been smoking an average of 1 pack per day. She has never used smokeless tobacco. She reports that she does not drink alcohol and does not use drugs.   Family History:  Her family history includes Diabetes Mellitus II in her sister.   Allergies Allergies  Allergen Reactions   Hydrochlorothiazide Nausea Only and Swelling    Legs were swollen   Amlodipine Swelling   Nebivolol Nausea Only    Abdominal pain    Sherrilyn Rist, MD [facility] Pager: See Shea Evans

## 2022-08-29 NOTE — Assessment & Plan Note (Signed)
-   Patient noted to have been hypotensive outpatient prior to presenting in the ED.  BP was borderline on arrival but now has normalized.  However we will continue to hold nighttime doses of antihypertensives and resume in the morning. -Resume labetalol, irbesartan, diltiazem in the morning

## 2022-08-29 NOTE — H&P (Signed)
History and Physical    Patient: Rhonda Blackwell DOB: 09-02-1950 DOA: 08/29/2022 DOS: the patient was seen and examined on 08/29/2022 PCP: [name], MD  Patient coming from: Home  Chief Complaint:  Chief Complaint  Patient presents with   Tachycardia   HPI: Rhonda Blackwell is a 72 y.o. female with medical history significant of COPD, Type 2 diabetes, CVA, HTN, Atrial fibrillation on Eliquis, metastatic disease with unknown primary who presents with increasing shortness of breath.  Patient recently admitted from 2/4 to 2/8 with sepsis secondary to community-acquired multifocal pneumonia.  She was discharged on 2 L nasal cannula at rest and up to 3 L nasal cannula on ambulation.  Also was newly diagnosed with atrial fibrillation and started on Eliquis.  Ever since discharge, she continues to feel unwell with persistent shortness of breath both at rest and with exertion.  Also feels anterior chest pain.  She also ran out of her Eliquis after 30 days and did not realize she had follow-up to get a refill. She reportedly was at a therapy appointment and became diaphoretic with SBP in the 80s and was sent to the ED.  Reportedly was recently found to have hypermetabolic left upper lung nodule as well as hypermetabolic hilar and mediastinal lymph nodes, left scapular lesion, area of right acetabulum and rectum, concerning for metastatic disease on PET scan.  She was followed by [facility] pulmonology/oncology, underwent CT-guided percutaneous biopsy of left scapular lytic bone lesion on 06/08/2022 and bronchoscopy with EBUS on 07/04/2022.  Bronchoscopy from 07/04/2022 report showed observed normal middle trachea, lower trachea, main carina, left main stem, LUL, lingula, LLL, right main stem, RUL, bronchus intermedius, RML and RLL. In the bronchus intermedius, there is a raised, highly vascular, smooth and pearl-like nodular focus. Biopsy deferred in setting of vascularity.  Pt then did  not follow up with oncology on multiple appointments since she was later hospitalized and also has been feeling unwell.   In the ED, she was afebrile and normotensive.  Initially noted to be in atrial fibrillation with RVR but this resolved spontaneously.  She was initially was placed on 2 L, however continued have significant oxygen desaturation was ultimately placed on 8 L high flow nasal cannula.  Has leukocytosis of 12.5, hemoglobin of 12.2, platelet of 575.  No significant electrolyte abnormalities on CMP other than increased anion gap 16.  Troponin of 30 with second troponin pending.  Negative flu/COVID/RSV.  CT chest was obtained which showed masslike consolidation within the left upper lobe abutting the left hilar concerning for central obstructing neoplasm.  There is peripheral areas of consolidation and interlobar septal thickening throughout the left upper lobe concerning for combination of postobstructive changes, regional metastatic disease and lymphangitic spread of tumor.  Mediastinal and hilar adenopathy consistent with nodule metastasis. 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  EDP consulted pulmonology who will see in consultation.  Hospitalist then consulted for admission.  Review of Systems: As mentioned in the history of present illness. All other systems reviewed and are negative. Past Medical History:  Diagnosis Date   Bronchitis    COPD (chronic obstructive pulmonary disease) ([place])    Diabetes mellitus without complication ([place])    Hypertension    Past Surgical History:  Procedure Laterality Date   CESAREAN SECTION     CHOLECYSTECTOMY     FOOT SURGERY     HERNIA REPAIR     Social History:  reports that  she has been smoking cigarettes. She has been smoking an average of 1 pack per day. She has never used smokeless tobacco. She reports that she does not drink alcohol and does not use drugs.  Allergies   Allergen Reactions   Hydrochlorothiazide Nausea Only and Swelling    Legs were swollen   Amlodipine Swelling   Nebivolol Nausea Only    Abdominal pain    Family History  Problem Relation Age of Onset   Diabetes Mellitus II Sister     Prior to Admission medications   Medication Sig Start Date End Date Taking? Authorizing Provider  acetaminophen (TYLENOL) 650 MG CR tablet Take 650 mg by mouth every 8 (eight) hours as needed for pain.    [provider]  albuterol (PROVENTIL HFA;VENTOLIN HFA) 108 (90 BASE) MCG/ACT inhaler Inhale 2 puffs into the lungs every 6 (six) hours as needed for shortness of breath (cough). 05/28/15   Mesner, Corene Cornea, MD  apixaban (ELIQUIS) 5 MG TABS tablet Take 1 tablet (5 mg total) by mouth 2 (two) times daily. 07/28/22 08/27/22  Donne Hazel, MD  atorvastatin (LIPITOR) 40 MG tablet Take 40 mg by mouth daily.    [provider]  azithromycin (ZITHROMAX) 250 MG tablet Take 1 tablet (250 mg total) by mouth daily for 2 more days 07/28/22   Donne Hazel, MD  Calcium-Magnesium-Vitamin D (CALCIUM 1200+D3 PO) Take 1 capsule by mouth daily.    [provider]  diltiazem (CARDIZEM CD) 120 MG 24 hr capsule Take 1 capsule (120 mg total) by mouth daily. 07/29/22 08/28/22  Donne Hazel, MD  DM-Phenylephrine-Acetaminophen (VICKS DAYQUIL COLD & FLU) 10-5-325 MG CAPS Take 2 capsules by mouth every 6 (six) hours as needed (for cough).    [provider]  DULoxetine (CYMBALTA) 60 MG capsule Take 60 mg by mouth 2 (two) times daily. 06/05/22   [provider]  fluticasone (FLONASE) 50 MCG/ACT nasal spray Place 2 sprays into both nostrils daily.    [provider]  fluticasone-salmeterol (ADVAIR) 250-50 MCG/ACT AEPB Inhale 1 puff into the lungs in the morning and at bedtime. 11/24/21   [provider]  furosemide (LASIX) 20 MG tablet Take 20 mg by mouth daily. 07/18/22   [provider]  irbesartan (AVAPRO) 150 MG  tablet Take 150 mg by mouth 2 (two) times daily.    [provider]  labetalol (NORMODYNE) 100 MG tablet Take 100 mg by mouth 2 (two) times daily.    [provider]  Magnesium 250 MG TABS Take 500 mg by mouth daily.    [provider]  Multiple Vitamins-Minerals (ONE-A-DAY WOMENS 50 PLUS) TABS Take 1 tablet by mouth daily.    [provider]  Omega-3 Fatty Acids ([employer] FISH OIL HALF-THE-SIZE) 500 MG CAPS Take 500 mg by mouth daily.    [provider]  pantoprazole (PROTONIX) 40 MG tablet Take 40 mg by mouth daily.    [provider]  sitaGLIPtan-metformin (JANUMET) 50-500 MG per tablet Take 1 tablet by mouth 2 (two) times daily with a meal.    [provider]    Physical Exam: Vitals:   08/29/22 1915 08/29/22 2000 08/29/22 2045 08/29/22 2100  BP: (!) 137/117 (!) 169/75 (!) 164/84 (!) 153/124  Pulse: 81 80 82 80  Resp: 20 (!) 21 (!) 25 19  Temp:      TempSrc:      SpO2: 100% 100% 98% 100%  Weight:      Height:  Constitutional: NAD, calm, comfortable, nontoxic elderly female laying flat in bed Eyes:  lids and conjunctivae normal ENMT: Mucous membranes are moist.  Neck: normal, supple Respiratory: Diminished lung sounds throughout but no wheezing, no crackles. Normal respiratory effort on 8 L via high flow nasal cannula.  Able to begin full sentences.  No accessory muscle use.  Cardiovascular: Regular rate and rhythm, no murmurs / rubs / gallops. No extremity edema.  Abdomen: no tenderness, soft, non-distended. Bowel sounds positive.  Musculoskeletal: no clubbing / cyanosis. No joint deformity upper and lower extremities. Good ROM, no contractures. Normal muscle tone.  Skin: no rashes, lesions, ulcers. No induration Neurologic: CN 2-12 grossly intact.  Strength 5/5 in all 4.  Psychiatric: Normal judgment and insight. Alert and oriented x 3. Tearful anxious mood after discussion of CT results. Data Reviewed:  See  HPI  Assessment and Plan: * Acute on chronic respiratory failure with hypoxia [facility]) - Patient recently hospitalized in February with sepsis secondary to pneumonia treated with antibiotics and was discharged with 2 L baseline 3 L on ambulation.  However continues to have increasing shortness of breath and now is requiring up to 8 L via night high flow nasal cannula. -She has been following with [facility] oncology for metastatic disease with ongoing workup to establish a primary source.  Subsequently underwent a bronchoscopy on 1/16 for a left upper lung nodule but had otherwise observed normal findings.  Unfortunately CT chest today showed masslike consolidation within the left upper lobe abutting the left hilar concerning for central obstructing neoplasm.peripheral areas of consolidation and interlobar septal thickening throughout the left upper lobe concerning for combination of postobstructive changes, regional metastatic disease and lymphangitic spread of tumor. -symptoms most likely secondary to mechanical obstruction -Pulmonology has been consulted and appreciate recommendations  Essential hypertension - Patient noted to have been hypotensive outpatient prior to presenting in the ED.  BP was borderline on arrival but now has normalized.  However we will continue to hold nighttime doses of antihypertensives and resume in the morning. -Resume labetalol, irbesartan, diltiazem in the morning  Type 2 diabetes mellitus without complications (HCC) - Last hemoglobin A1c of 6.3 on 07/24/2022 -Keep on sensitive sliding scale insulin  Tobacco use -Ongoing one pack per day use and is working towards cessation  Chronic anxiety - Continue home Cymbalta - pt appeared anxious at discussion of her CT chest imaging and potential of aggressive malignancy. Appears to have difficulty accepting and wanted to just discuss her home medications after being given results.   Paroxysmal atrial fibrillation with  RVR ([place]) - Patient presented with in atrial fibrillation with RVR which has resolved spontaneously -This was newly diagnosed in her recent hospitalization in February -Continue home diltiazem, labetalol in the morning -Patient ran out of her 30-day Eliquis about last week and did not realize she needed to follow-up for refill.  Will resume Eliquis today.      Advance Care Planning: Full  Consults: Pulmonology  Family Communication: Discussed with husband Rhonda Blackwell over the phone with patient at bedside  Severity of Illness: The appropriate patient status for this patient is INPATIENT. Inpatient status is judged to be reasonable and necessary in order to provide the required intensity of service to ensure the patient's safety. The patient's presenting symptoms, physical exam findings, and initial radiographic and laboratory data in the context of their chronic comorbidities is felt to place them at high risk for further clinical deterioration. Furthermore, it is not anticipated that the patient will  be medically stable for discharge from the hospital within 2 midnights of admission.   * I certify that at the point of admission it is my clinical judgment that the patient will require inpatient hospital care spanning beyond 2 midnights from the point of admission due to high intensity of service, high risk for further deterioration and high frequency of surveillance required.*  Author: Orene Desanctis, DO 08/29/2022 9:10 PM  For on call review [email or web address].

## 2022-08-29 NOTE — Assessment & Plan Note (Signed)
-   Patient recently hospitalized in February with sepsis secondary to pneumonia treated with antibiotics and was discharged with 2 L baseline 3 L on ambulation.  However continues to have increasing shortness of breath and now is requiring up to 8 L via night high flow nasal cannula. -She has been following with [facility] oncology for metastatic disease with ongoing workup to establish a primary source.  Subsequently underwent a bronchoscopy on 1/16 for a left upper lung nodule but had otherwise observed normal findings.  Unfortunately CT chest today showed masslike consolidation within the left upper lobe abutting the left hilar concerning for central obstructing neoplasm.peripheral areas of consolidation and interlobar septal thickening throughout the left upper lobe concerning for combination of postobstructive changes, regional metastatic disease and lymphangitic spread of tumor. -symptoms most likely secondary to mechanical obstruction -Pulmonology has been consulted and appreciate recommendations

## 2022-08-29 NOTE — ED Provider Triage Note (Signed)
Emergency Medicine Provider Triage Evaluation Note  Rhonda Blackwell , a 72 y.o. female  was evaluated in triage.  Pt complains of hypertension.  Patient was at a therapy appointment earlier today when she began to experience a drop in blood pressure and became diaphoretic.  History of type 2 diabetes and she reports she did not eat this morning.  Is also been complaining some generalized chest pain for the last month or so.  Does have a history of atrial fibrillation and no longer taking her blood thinner.  Does take labetalol and reports that she takes it twice a day and took a dose earlier today..  Review of Systems  Positive: As above Negative: As above  Physical Exam  BP 105/81   Pulse 94   Temp 97.7 F (36.5 C) (Oral)   Resp 18   Ht '5\' 4"'$  (1.626 m)   Wt 65.8 kg   SpO2 92%   BMI 24.89 kg/m  Gen:   Awake, diaphoretic and uncomfortable Resp:  Normal effort  MSK:   Moves extremities without difficulty  Other:    Medical Decision Making  Medically screening exam initiated at 1:56 PM.  Appropriate orders placed.  Rhonda Blackwell was informed that the remainder of the evaluation will be completed by another provider, this initial triage assessment does not replace that evaluation, and the importance of remaining in the ED until their evaluation is complete.     Luvenia Heller, PA-C 08/29/22 1357

## 2022-08-29 NOTE — ED Triage Notes (Signed)
Pt at therapy appointment, pt became diaphoretic, bp dropped to [id number] systolic; pt states she did not eat  breakfast; hx DM, cbg 140 at home; pt c/o generalized cp x 1 month; endorses some N/V, endorses sob; cp described as tight and sharp; hx afib

## 2022-08-29 NOTE — Assessment & Plan Note (Signed)
-   Patient presented with in atrial fibrillation with RVR which has resolved spontaneously -This was newly diagnosed in her recent hospitalization in February -Continue home diltiazem, labetalol in the morning -Patient ran out of her 30-day Eliquis about last week and did not realize she needed to follow-up for refill.  Will resume Eliquis today.

## 2022-08-29 NOTE — ED Notes (Addendum)
Pt intermittently sleeping, oxygen saturations maintaining in the 80s%. Increased flow to [facility] with no improvement. Lowest noted 84%, no distress. Pt placed on NR and RT called for assistance to trial salter with higher O2 flow. MD notified.

## 2022-08-29 NOTE — Assessment & Plan Note (Signed)
-   Continue home Cymbalta - pt appeared anxious at discussion of her CT chest imaging and potential of aggressive malignancy. Appears to have difficulty accepting and wanted to just discuss her home medications after being given results.

## 2022-08-29 NOTE — Assessment & Plan Note (Signed)
-   Last hemoglobin A1c of 6.3 on 07/24/2022 -Keep on sensitive sliding scale insulin

## 2022-08-29 NOTE — ED Notes (Signed)
X-ray at bedside

## 2022-08-29 NOTE — ED Provider Notes (Signed)
  Provider Note MRN:  [id number]  Arrival date & time: 08/29/22    ED Course and Medical Decision Making  Assumed care from [place] at shift change.  See note from prior team for complete details, in brief:  Afib on anticoagulation, back in SR. DIB pta. CXR abn, CT pending. +/- admit  72 year old female history of COPD, DM, HTN, bronchitis A-fib Presents with tachycardia, narrow complex concerning for A-fib RVR Converted to sinus rhythm, rate controlled Dyspnea prior to arrival, chest tightness She is anticoagulated for A-fib  Plan per prior physician follow-up labs, CT imaging, likely admit   Clinical Course as of 08/29/22 2023  Tue Aug 29, 2022  1820 Patient hypoxic on 6L [place] 85%, placed on salter.  No significant respiratory distress. [SG]  1821 CT chest "IMPRESSION: 1. Masslike consolidation within the left upper lobe abutting the left hilum, concerning for central obstructing neoplasm. 2. Peripheral areas of consolidation and interlobular septal thickening throughout the left upper lobe, concerning for a combination of postobstructive changes, regional metastatic disease, and lymphangitic spread of tumor." [SG]  6812 D/w PCCM, will see in consult  [SG]  1933 Admitted to [facility] [SG]    Clinical Course User Index [SG] Jeanell Sparrow, DO   Patient with hypoxia, possibly secondary to large masslike lesions in her lung concerning for neoplasm.  Patient history of lung cancer, she is previously seen in [facility] but was dismissed from practice due to multiple missed appointments.  Has not seen pulmonologist in over a year.  She is requiring high flow nasal cannula, she is typically on 2 L nasal cannula at home, sometimes 3 L if needed.  Now requiring 10 L salter.  Will discuss with pulmonology for consult, will see in AM  Admitted to [facility]   .Critical Care  Performed by: Jeanell Sparrow, DO Authorized by: Jeanell Sparrow, DO   Critical care provider statement:    Critical  care time (minutes):  30   Critical care time was exclusive of:  Separately billable procedures and treating other patients   Critical care was necessary to treat or prevent imminent or life-threatening deterioration of the following conditions:  Respiratory failure   Critical care was time spent personally by me on the following activities:  Development of treatment plan with patient or surrogate, discussions with consultants, evaluation of patient's response to treatment, examination of patient, ordering and review of laboratory studies, ordering and review of radiographic studies, ordering and performing treatments and interventions, pulse oximetry, re-evaluation of patient's condition, review of old charts and obtaining history from patient or surrogate   Care discussed with: admitting provider     Final Clinical Impressions(s) / ED Diagnoses     ICD-10-CM   1. Hypoxia  R09.02     2. Atrial fibrillation with RVR (HCC)  I48.91     3. Lung mass  R91.8       ED Discharge Orders     None       Discharge Instructions   None        Jeanell Sparrow, DO 08/29/22 2024

## 2022-08-29 NOTE — Assessment & Plan Note (Signed)
-  Ongoing one pack per day use and is working towards cessation

## 2022-08-30 DIAGNOSIS — C3402 Malignant neoplasm of left main bronchus: Secondary | ICD-10-CM

## 2022-08-30 DIAGNOSIS — J9621 Acute and chronic respiratory failure with hypoxia: Secondary | ICD-10-CM | POA: Diagnosis not present

## 2022-08-30 DIAGNOSIS — E119 Type 2 diabetes mellitus without complications: Secondary | ICD-10-CM | POA: Diagnosis not present

## 2022-08-30 DIAGNOSIS — F419 Anxiety disorder, unspecified: Secondary | ICD-10-CM | POA: Diagnosis not present

## 2022-08-30 DIAGNOSIS — I10 Essential (primary) hypertension: Secondary | ICD-10-CM | POA: Diagnosis not present

## 2022-08-30 LAB — CBC
HCT: 32.6 % — ABNORMAL LOW (ref 36.0–46.0)
Hemoglobin: 10.4 g/dL — ABNORMAL LOW (ref 12.0–15.0)
MCH: 25.2 pg — ABNORMAL LOW (ref 26.0–34.0)
MCHC: 31.9 g/dL (ref 30.0–36.0)
MCV: 78.9 fL — ABNORMAL LOW (ref 80.0–100.0)
Platelets: 450 10*3/uL — ABNORMAL HIGH (ref 150–400)
RBC: 4.13 MIL/uL (ref 3.87–5.11)
RDW: 13.9 % (ref 11.5–15.5)
WBC: 10.5 10*3/uL (ref 4.0–10.5)
nRBC: 0 % (ref 0.0–0.2)

## 2022-08-30 LAB — BASIC METABOLIC PANEL
Anion gap: 11 (ref 5–15)
BUN: 10 mg/dL (ref 8–23)
CO2: 26 mmol/L (ref 22–32)
Calcium: 8.6 mg/dL — ABNORMAL LOW (ref 8.9–10.3)
Chloride: 98 mmol/L (ref 98–111)
Creatinine, Ser: 0.76 mg/dL (ref 0.44–1.00)
GFR, Estimated: >60 mL/min (ref >60)
Glucose, Bld: 112 mg/dL — ABNORMAL HIGH (ref 70–99)
Potassium: 3.2 mmol/L — ABNORMAL LOW (ref 3.5–5.1)
Sodium: 135 mmol/L (ref 135–145)

## 2022-08-30 LAB — GLUCOSE, CAPILLARY: Glucose-Capillary: 110 mg/dL — ABNORMAL HIGH (ref 70–99)

## 2022-08-30 LAB — CBG MONITORING, ED
Glucose-Capillary: 118 mg/dL — ABNORMAL HIGH (ref 70–99)
Glucose-Capillary: 145 mg/dL — ABNORMAL HIGH (ref 70–99)

## 2022-08-30 MED ORDER — POTASSIUM CHLORIDE CRYS ER 20 MEQ PO TBCR
40.0000 meq | EXTENDED_RELEASE_TABLET | Freq: Once | ORAL | Status: AC
  Administered 2022-08-30: 40 meq via ORAL
  Filled 2022-08-30: qty 2

## 2022-08-30 NOTE — ED Notes (Signed)
Patient turned and reposition

## 2022-08-30 NOTE — ED Notes (Signed)
Patient placed onto bedpan per request.

## 2022-08-30 NOTE — Progress Notes (Addendum)
PROGRESS NOTE    Rhonda Blackwell  [id number] DOB: 04-Oct-1950 DOA: 08/29/2022 PCP: [name], MD    Brief Narrative:  Rhonda Blackwell is a 72 y.o. female with medical history significant of COPD, Type 2 diabetes, CVA, HTN, Atrial fibrillation on Eliquis, metastatic disease with unknown primary presented to the hospital with increasing shortness of breath.  Of note, patient was recently admitted between  07/23/22 to 07/27/22 with sepsis secondary to community-acquired multifocal pneumonia.  She was discharged on 2 L nasal cannula at rest and up to 3 L nasal cannula on ambulation.  Patient was also recently diagnosed atrial fibrillation and was on Eliquis.  Ever since her discharge, patient was feeling unwell and had persistent shortness of breath and had ran out of her Eliquis after 30 days.  History of hypermetabolic left upper lung nodule as well as hypermetabolic hilar and mediastinal lymph nodes, left scapular lesion, area of right acetabulum and rectum, concerning for metastatic disease on PET scan.  Patient was followed by [place] pulmonology/oncology, underwent CT-guided percutaneous biopsy of left scapular lytic bone lesion on 06/08/2022 and bronchoscopy with EBUS on 07/04/2022.  Bronchoscopy from 07/04/2022 report showed observed normal middle trachea, lower trachea, main carina, left main stem, LUL, lingula, LLL, right main stem, RUL, bronchus intermedius, RML and RLL. In the bronchus intermedius, there is a raised, highly vascular, smooth and pearl-like nodular focus. Biopsy deferred in setting of vascularity.  Pt then did not follow up with oncology on multiple appointments.   In the ED, on this presentation, she was afebrile and normotensive.  Initially noted to be in atrial fibrillation with RVR but this  resolved spontaneously.  She was initially was placed on 2 L, however continued have significant oxygen desaturation was ultimately placed on 8 L high flow nasal cannula.  Mild  leukocytosis at 12.5.  Troponin was 30. Negative flu/COVID/RSV. CT chest was obtained which showed masslike consolidation within the left upper lobe abutting the left hilar concerning for central obstructing neoplasm.  There is peripheral areas of consolidation and interlobar septal thickening throughout the left upper lobe concerning for combination of postobstructive changes, regional metastatic disease and lymphangitic spread of tumor.  Mediastinal and hilar adenopathy consistent with nodule metastasis. 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 EDP consulted pulmonology and patient was admitted to the hospital for further evaluation and treatment.  Assessment and Plan:   Acute on chronic respiratory failure with hypoxia  Was on 2 L of oxygen at baseline but currently on 8 L of oxygen by high flow nasal cannula.  Patient following with [facility] oncology for metastatic disease workup.  She was discharged from the oncology office due to lack of follow-up appointments.  History of bronchoscopy on 1/16 for a left upper lung nodule but had otherwise observed normal findings.  Unfortunately CT chest this time shows showed masslike consolidation within the left upper lobe abutting the left hilar concerning for central obstructing neoplasm.peripheral areas of consolidation and interlobar septal thickening throughout the left upper lobe concerning for combination of postobstructive changes, regional metastatic disease and lymphangitic spread of tumor.  Symptoms of respiratory distress likely secondary to mechanical obstruction. Pulmonary on board.  Hypokalemia continue to replenish.  Check levels in a.m.   Essential hypertension Patient was hypotensive as outpatient.  Currently labetalol irbesartan Cardizem    Type 2 diabetes mellitus without complications (HCC) Latest hemoglobin A1c of 6.3 on 07/24/2022 continue sliding scale insulin  Tobacco  use Ongoing tobacco abuse.  Counseling done   Chronic anxiety Cymbalta.   Paroxysmal atrial fibrillation with RVR ([place]) New diagnosis.  Has improved.  Continue Cardizem labetalol, Eliquis has been resumed.     DVT prophylaxis:  apixaban (ELIQUIS) tablet 5 mg   Code Status:     Code Status: Full Code  Disposition: Uncertain at this time.  Status is: Inpatient  Remains inpatient appropriate because: Obstructive malignancy, shortness of breath, hypoxic respiratory failure   Family Communication: None at bedside  Consultants:  Pulmonary  Procedures:  None  Antimicrobials:  Zosyn IV  Anti-infectives (From admission, onward)    Start     Dose/Rate Route Frequency Ordered Stop   08/29/22 2315  piperacillin-tazobactam (ZOSYN) IVPB 3.375 g        3.375 g 12.5 mL/hr over 240 Minutes Intravenous Every 8 hours 08/29/22 2134         Subjective: Today, patient was seen and examined at bedside.  Complains of cough and chest discomfort with shortness of breath.  Objective: Vitals:   08/30/22 0500 08/30/22 0524 08/30/22 0700 08/30/22 1000  BP: (!) 165/76  (!) 170/75 (!) 142/72  Pulse: 80 80 80 93  Resp: '19 19 18 20  '$ Temp:  98.9 F (37.2 C)  98.8 F (37.1 C)  TempSrc:  Oral  Oral  SpO2: 100% 99% 100% 95%  Weight:      Height:        Intake/Output Summary (Last 24 hours) at 08/30/2022 1147 Last data filed at 08/30/2022 0432 Gross per 24 hour  Intake 39.46 ml  Output --  Net 39.46 ml   Filed Weights   08/29/22 1350  Weight: 65.8 kg    Physical Examination: Body mass index is 24.89 kg/m.  General:  Average built, not in obvious distress, on high flow nasal cannula. HENT:   No scleral pallor or icterus noted. Oral mucosa is moist.  Chest:  Diminished breath sounds bilaterally. No crackles or wheezes.  CVS: S1 &S2 heard. No murmur.  Regular rate and rhythm. Abdomen: Soft, nontender, nondistended.  Bowel sounds are heard.   Extremities: No cyanosis, clubbing or  edema.  Peripheral pulses are palpable. Psych: Alert, awake and oriented, CIWA protocol CNS:  No cranial nerve deficits.  Power equal in all extremities.   Skin: Warm and dry.  No rashes noted.  Data Reviewed:   CBC: Recent Labs  Lab 08/29/22 1400 08/30/22 0337  WBC 12.5* 10.5  NEUTROABS 9.9*  --   HGB 12.2 10.4*  HCT 38.2 32.6*  MCV 79.4* 78.9*  PLT 575* 450*    Basic Metabolic Panel: Recent Labs  Lab 08/29/22 1400 08/30/22 0337  NA 136 135  K 3.8 3.2*  CL 97* 98  CO2 23 26  GLUCOSE 146* 112*  BUN 10 10  CREATININE 0.90 0.76  CALCIUM 9.1 8.6*    Liver Function Tests: Recent Labs  Lab 08/29/22 1400  AST 18  ALT 12  ALKPHOS 97  BILITOT 0.7  PROT 7.1  ALBUMIN 3.2*     Radiology Studies: CT Chest W Contrast  Result Date: 08/29/2022 CLINICAL DATA:  History of chest pain and diaphoresis, pneumonia EXAM: CT CHEST WITH CONTRAST TECHNIQUE: Multidetector CT imaging of the chest was performed during intravenous contrast administration. RADIATION DOSE REDUCTION: This exam was performed according to the departmental dose-optimization program which includes automated exposure control, adjustment of the mA and/or kV according to patient size and/or use of iterative reconstruction technique. CONTRAST:  67m OMNIPAQUE IOHEXOL  350 MG/ML SOLN COMPARISON:  08/29/2022 FINDINGS: Cardiovascular: There is a trace pericardial effusion. The heart is not enlarged. Calcifications of the mitral annulus. Diffuse atherosclerosis of the coronary vasculature. Dilated main pulmonary artery measuring 4 cm consistent with pulmonary arterial hypertension. There is insufficient contrast enhancement within the pulmonary vasculature to assess for pulmonary emboli. No evidence of thoracic aortic aneurysm or dissection. Diffuse atherosclerosis of the thoracic aorta without flow limiting stenosis. Mediastinum/Nodes: Thyroid, trachea, and esophagus are unremarkable. Mediastinal and hilar adenopathy,  measuring up to 10 mm in the subcarinal region. Lungs/Pleura: There are small bilateral pleural effusions, left greater than right, volume estimated less than 500 cc each. Upper lobe predominant emphysematous changes are noted. No pneumothorax. There is masslike consolidation within the left upper lobe abutting the left hilum, measuring 4.0 x 4.2 cm reference image 54/4, with more peripheral areas of consolidation and interlobular septal thickening elsewhere within the left upper lobe. Findings are more concerning for central neoplasm and lymphangitic spread of disease within the left upper lobe, rather than acute pneumonia, given the additional findings seen within the thoracic cage. Upper Abdomen: No acute abnormality. Musculoskeletal: There are numerous lytic lesions throughout the visualized thoracolumbar spine, most pronounced at T2, T6, T9, and L2. There are associated pathologic compression fractures at T6, T9, and L2, with less than 25% loss of height and no retropulsion. Additional lytic metastatic lesions are seen within the left scapula and bilateral thoracic cage. Pathologic rib fractures are seen within the right fifth and sixth ribs, as well as within the left second, fifth, and sixth ribs. Reconstructed images demonstrate no additional findings. IMPRESSION: 1. Masslike consolidation within the left upper lobe abutting the left hilum, concerning for central obstructing neoplasm. 2. Peripheral areas of consolidation and interlobular septal thickening throughout the left upper lobe, concerning for a combination of postobstructive changes, regional metastatic disease, and lymphangitic spread of tumor. 3. Mediastinal and hilar adenopathy consistent with nodal metastases. 4. Diffuse lytic bony metastases as above. 5. Small bilateral pleural effusions, left greater than right. 6. Dilated main pulmonary artery consistent with pulmonary arterial hypertension. 7. Trace pericardial effusion. 8. Aortic  Atherosclerosis (ICD10-I70.0) and Emphysema (ICD10-J43.9). Electronically Signed   By: Randa Ngo M.D.   On: 08/29/2022 17:59   DG Chest Portable 1 View  Result Date: 08/29/2022 CLINICAL DATA:  Chest pain, diaphoresis EXAM: PORTABLE CHEST 1 VIEW COMPARISON:  Portable exam 1412 hours compared to 07/27/2022 FINDINGS: Normal heart size and pulmonary vascularity. Atherosclerotic calcification aorta. Enlargement of RIGHT hilum, questionably LEFT hilum. Emphysematous changes with increased opacity in LEFT upper lobe and BILATERAL lower lobe since previous study question infiltrate. No pleural effusion or pneumothorax. Bones demineralized. IMPRESSION: Increased pulmonary infiltrates question pneumonia. Persistent hilar enlargement mass/adenopathy not excluded; CT chest with contrast recommended for further assessment. Electronically Signed   By: Lavonia Dana M.D.   On: 08/29/2022 14:48      LOS: 1 day    Flora Lipps, MD [facility] Available via Epic secure chat 7am-7pm After these hours, please refer to coverage provider listed on [email or web address] 08/30/2022, 11:47 AM

## 2022-08-30 NOTE — ED Notes (Signed)
ED TO INPATIENT HANDOFF REPORT  ED Nurse Name and Phone #:   S Name/Age/Gender Rhonda Blackwell 72 y.o. female Room/Bed: 007C/007C  Code Status   Code Status: Full Code  Home/SNF/Other Home Patient oriented to: self, place, time, and situation Is this baseline? Yes   Triage Complete: Triage complete  Chief Complaint Acute on chronic respiratory failure with hypoxia ([place]) [J96.21]  Triage Note Pt at therapy appointment, pt became diaphoretic, bp dropped to [id number] systolic; pt states she did not eat  breakfast; hx DM, cbg 140 at home; pt c/o generalized cp x 1 month; endorses some N/V, endorses sob; cp described as tight and sharp; hx afib   Allergies Allergies  Allergen Reactions   Hydrochlorothiazide Nausea Only and Swelling    Legs were swollen   Amlodipine Swelling   Nebivolol Nausea Only    Abdominal pain    Level of Care/Admitting Diagnosis ED Disposition     ED Disposition  Admit   Condition  --   [place]: [place] [[id number]]  Level of Care: Progressive [102]  Admit to Progressive based on following criteria: RESPIRATORY PROBLEMS hypoxemic/hypercapnic respiratory failure that is responsive to NIPPV (BiPAP) or High Flow Nasal Cannula (6-80 lpm). Frequent assessment/intervention, no > Q2 hrs < Q4 hrs, to maintain oxygenation and pulmonary hygiene.  May admit patient to [facility] or [facility] if equivalent level of care is available:: No  Covid Evaluation: Asymptomatic - no recent exposure (last 10 days) testing not required  Diagnosis: Acute on chronic respiratory failure with hypoxia [facility]) [id number]  Admitting Physician: Orene Desanctis [id number]  Attending Physician: Orene Desanctis [id number]  Certification:: I certify this patient will need inpatient services for at least 2 midnights  Estimated Length of Stay: 3          B Medical/Surgery History Past Medical History:  Diagnosis Date   Bronchitis    COPD (chronic obstructive  pulmonary disease) ([place])    Diabetes mellitus without complication ([place])    Hypertension    Past Surgical History:  Procedure Laterality Date   CESAREAN SECTION     CHOLECYSTECTOMY     FOOT SURGERY     HERNIA REPAIR       A IV Location/Drains/Wounds Patient Lines/Drains/Airways Status     Active Line/Drains/Airways     Name Placement date Placement time Site Days   Peripheral IV 08/29/22 20 G Right Antecubital 08/29/22  1358  Antecubital  1            Intake/Output Last 24 hours  Intake/Output Summary (Last 24 hours) at 08/30/2022 1541 Last data filed at 08/30/2022 [id number] Gross per 24 hour  Intake 39.46 ml  Output --  Net 39.46 ml    Labs/Imaging Results for orders placed or performed during the hospital encounter of 08/29/22 (from the past 48 hour(s))  CBG monitoring, ED     Status: Abnormal   Collection Time: 08/29/22  1:48 PM  Result Value Ref Range   Glucose-Capillary 153 (H) 70 - 99 mg/dL    Comment: Glucose reference range applies only to samples taken after fasting for at least 8 hours.  Comprehensive metabolic panel     Status: Abnormal   Collection Time: 08/29/22  2:00 PM  Result Value Ref Range   Sodium 136 135 - 145 mmol/L   Potassium 3.8 3.5 - 5.1 mmol/L   Chloride 97 (L) 98 - 111 mmol/L   CO2 23 22 - 32 mmol/L  Glucose, Bld 146 (H) 70 - 99 mg/dL    Comment: Glucose reference range applies only to samples taken after fasting for at least 8 hours.   BUN 10 8 - 23 mg/dL   Creatinine, Ser 0.90 0.44 - 1.00 mg/dL   Calcium 9.1 8.9 - 10.3 mg/dL   Total Protein 7.1 6.5 - 8.1 g/dL   Albumin 3.2 (L) 3.5 - 5.0 g/dL   AST 18 15 - 41 U/L   ALT 12 0 - 44 U/L   Alkaline Phosphatase 97 38 - 126 U/L   Total Bilirubin 0.7 0.3 - 1.2 mg/dL   GFR, Estimated >60 >60 mL/min    Comment: (NOTE) Calculated using the CKD-EPI Creatinine Equation (2021)    Anion gap 16 (H) 5 - 15    Comment: Performed at [facility], [place] [address], [place], [place] [address]   CBC with Differential     Status: Abnormal   Collection Time: 08/29/22  2:00 PM  Result Value Ref Range   WBC 12.5 (H) 4.0 - 10.5 K/uL   RBC 4.81 3.87 - 5.11 MIL/uL   Hemoglobin 12.2 12.0 - 15.0 g/dL   HCT 38.2 36.0 - 46.0 %   MCV 79.4 (L) 80.0 - 100.0 fL   MCH 25.4 (L) 26.0 - 34.0 pg   MCHC 31.9 30.0 - 36.0 g/dL   RDW 14.0 11.5 - 15.5 %   Platelets 575 (H) 150 - 400 K/uL   nRBC 0.0 0.0 - 0.2 %   Neutrophils Relative % 79 %   Neutro Abs 9.9 (H) 1.7 - 7.7 K/uL   Lymphocytes Relative 13 %   Lymphs Abs 1.7 0.7 - 4.0 K/uL   Monocytes Relative 6 %   Monocytes Absolute 0.8 0.1 - 1.0 K/uL   Eosinophils Relative 0 %   Eosinophils Absolute 0.0 0.0 - 0.5 K/uL   Basophils Relative 1 %   Basophils Absolute 0.1 0.0 - 0.1 K/uL   Immature Granulocytes 1 %   Abs Immature Granulocytes 0.06 0.00 - 0.07 K/uL    Comment: Performed at [facility], [place] [address] [place], [place] [address]  Troponin I (High Sensitivity)     Status: Abnormal   Collection Time: 08/29/22  2:00 PM  Result Value Ref Range   Troponin I (High Sensitivity) 30 (H) <18 ng/L    Comment: (NOTE) Elevated high sensitivity troponin I (hsTnI) values and significant  changes across serial measurements may suggest ACS but many other  chronic and acute conditions are known to elevate hsTnI results.  Refer to the "Links" section for chest pain algorithms and additional  guidance. Performed at [facility], [place] [address] [place], [place] [address]   Resp panel by RT-PCR (RSV, Flu A&B, Covid) Anterior Nasal Swab     Status: None   Collection Time: 08/29/22  2:07 PM   Specimen: Anterior Nasal Swab  Result Value Ref Range   SARS Coronavirus 2 by RT PCR NEGATIVE NEGATIVE   Influenza A by PCR NEGATIVE NEGATIVE   Influenza B by PCR NEGATIVE NEGATIVE    Comment: (NOTE) The Xpert Xpress SARS-CoV-2/FLU/RSV plus assay is intended as an aid in the diagnosis of influenza from Nasopharyngeal swab specimens and should not  be used as a sole basis for treatment. Nasal washings and aspirates are unacceptable for Xpert Xpress SARS-CoV-2/FLU/RSV testing.  Fact Sheet for Patients: EntrepreneurPulse.com.au  Fact Sheet for Healthcare Providers: IncredibleEmployment.be  This test is not yet approved or cleared by  the [employer] and has been authorized for detection and/or diagnosis of SARS-CoV-2 by FDA under an Emergency Use Authorization (EUA). This EUA will remain in effect (meaning this test can be used) for the duration of the COVID-19 declaration under Section 564(b)(1) of the Act, 21 U.S.C. section 360bbb-3(b)(1), unless the authorization is terminated or revoked.     Resp Syncytial Virus by PCR NEGATIVE NEGATIVE    Comment: (NOTE) Fact Sheet for Patients: EntrepreneurPulse.com.au  Fact Sheet for Healthcare Providers: IncredibleEmployment.be  This test is not yet approved or cleared by the [place] FDA and has been authorized for detection and/or diagnosis of SARS-CoV-2 by FDA under an Emergency Use Authorization (EUA). This EUA will remain in effect (meaning this test can be used) for the duration of the COVID-19 declaration under Section 564(b)(1) of the Act, 21 U.S.C. section 360bbb-3(b)(1), unless the authorization is terminated or revoked.  Performed at [facility], [place] [address], [place], [place] [address]   Troponin I (High Sensitivity)     Status: Abnormal   Collection Time: 08/29/22  5:00 PM  Result Value Ref Range   Troponin I (High Sensitivity) 79 (H) <18 ng/L    Comment: (NOTE) Elevated high sensitivity troponin I (hsTnI) values and significant  changes across serial measurements may suggest ACS but many other  chronic and acute conditions are known to elevate hsTnI results.  Refer to the "Links" section for chest pain algorithms and additional  guidance. Performed at [place], [place] [address] [place], [place] [address]   CBG monitoring, ED     Status: Abnormal   Collection Time: 08/29/22 11:31 PM  Result Value Ref Range   Glucose-Capillary 130 (H) 70 - 99 mg/dL    Comment: Glucose reference range applies only to samples taken after fasting for at least 8 hours.  CBC     Status: Abnormal   Collection Time: 08/30/22  3:37 AM  Result Value Ref Range   WBC 10.5 4.0 - 10.5 K/uL   RBC 4.13 3.87 - 5.11 MIL/uL   Hemoglobin 10.4 (L) 12.0 - 15.0 g/dL   HCT 32.6 (L) 36.0 - 46.0 %   MCV 78.9 (L) 80.0 - 100.0 fL   MCH 25.2 (L) 26.0 - 34.0 pg   MCHC 31.9 30.0 - 36.0 g/dL   RDW 13.9 11.5 - 15.5 %   Platelets 450 (H) 150 - 400 K/uL   nRBC 0.0 0.0 - 0.2 %    Comment: Performed at [facility] [address] [place], [place] [id number]  Basic metabolic panel     Status: Abnormal   Collection Time: 08/30/22  3:37 AM  Result Value Ref Range   Sodium 135 135 - 145 mmol/L   Potassium 3.2 (L) 3.5 - 5.1 mmol/L   Chloride 98 98 - 111 mmol/L   CO2 26 22 - 32 mmol/L   Glucose, Bld 112 (H) 70 - 99 mg/dL    Comment: Glucose reference range applies only to samples taken after fasting for at least 8 hours.   BUN 10 8 - 23 mg/dL   Creatinine, Ser 0.76 0.44 - 1.00 mg/dL   Calcium 8.6 (L) 8.9 - 10.3 mg/dL   GFR, Estimated >60 >60 mL/min    Comment: (NOTE) Calculated using the CKD-EPI Creatinine Equation (2021)    Anion gap 11 5 - 15    Comment: Performed at [place] [address], [place], [place] [address]  CBG monitoring,  ED     Status: Abnormal   Collection Time: 08/30/22  8:38 AM  Result Value Ref Range   Glucose-Capillary 118 (H) 70 - 99 mg/dL    Comment: Glucose reference range applies only to samples taken after fasting for at least 8 hours.  CBG monitoring, ED     Status: Abnormal   Collection Time: 08/30/22 12:28 PM  Result Value Ref Range   Glucose-Capillary 145 (H) 70 - 99 mg/dL    Comment: Glucose reference range applies only to samples taken  after fasting for at least 8 hours.   CT Chest W Contrast  Result Date: 08/29/2022 CLINICAL DATA:  History of chest pain and diaphoresis, pneumonia EXAM: CT CHEST WITH CONTRAST TECHNIQUE: Multidetector CT imaging of the chest was performed during intravenous contrast administration. RADIATION DOSE REDUCTION: This exam was performed according to the departmental dose-optimization program which includes automated exposure control, adjustment of the mA and/or kV according to patient size and/or use of iterative reconstruction technique. CONTRAST:  34m OMNIPAQUE IOHEXOL 350 MG/ML SOLN COMPARISON:  08/29/2022 FINDINGS: Cardiovascular: There is a trace pericardial effusion. The heart is not enlarged. Calcifications of the mitral annulus. Diffuse atherosclerosis of the coronary vasculature. Dilated main pulmonary artery measuring 4 cm consistent with pulmonary arterial hypertension. There is insufficient contrast enhancement within the pulmonary vasculature to assess for pulmonary emboli. No evidence of thoracic aortic aneurysm or dissection. Diffuse atherosclerosis of the thoracic aorta without flow limiting stenosis. Mediastinum/Nodes: Thyroid, trachea, and esophagus are unremarkable. Mediastinal and hilar adenopathy, measuring up to 10 mm in the subcarinal region. Lungs/Pleura: There are small bilateral pleural effusions, left greater than right, volume estimated less than 500 cc each. Upper lobe predominant emphysematous changes are noted. No pneumothorax. There is masslike consolidation within the left upper lobe abutting the left hilum, measuring 4.0 x 4.2 cm reference image 54/4, with more peripheral areas of consolidation and interlobular septal thickening elsewhere within the left upper lobe. Findings are more concerning for central neoplasm and lymphangitic spread of disease within the left upper lobe, rather than acute pneumonia, given the additional findings seen within the thoracic cage. Upper Abdomen: No  acute abnormality. Musculoskeletal: There are numerous lytic lesions throughout the visualized thoracolumbar spine, most pronounced at T2, T6, T9, and L2. There are associated pathologic compression fractures at T6, T9, and L2, with less than 25% loss of height and no retropulsion. Additional lytic metastatic lesions are seen within the left scapula and bilateral thoracic cage. Pathologic rib fractures are seen within the right fifth and sixth ribs, as well as within the left second, fifth, and sixth ribs. Reconstructed images demonstrate no additional findings. IMPRESSION: 1. Masslike consolidation within the left upper lobe abutting the left hilum, concerning for central obstructing neoplasm. 2. Peripheral areas of consolidation and interlobular septal thickening throughout the left upper lobe, concerning for a combination of postobstructive changes, regional metastatic disease, and lymphangitic spread of tumor. 3. Mediastinal and hilar adenopathy consistent with nodal metastases. 4. Diffuse lytic bony metastases as above. 5. Small bilateral pleural effusions, left greater than right. 6. Dilated main pulmonary artery consistent with pulmonary arterial hypertension. 7. Trace pericardial effusion. 8. Aortic Atherosclerosis (ICD10-I70.0) and Emphysema (ICD10-J43.9). Electronically Signed   By: MRanda NgoM.D.   On: 08/29/2022 17:59   DG Chest Portable 1 View  Result Date: 08/29/2022 CLINICAL DATA:  Chest pain, diaphoresis EXAM: PORTABLE CHEST 1 VIEW COMPARISON:  Portable exam 1412 hours compared to 07/27/2022 FINDINGS: Normal heart size and pulmonary vascularity. Atherosclerotic calcification  aorta. Enlargement of RIGHT hilum, questionably LEFT hilum. Emphysematous changes with increased opacity in LEFT upper lobe and BILATERAL lower lobe since previous study question infiltrate. No pleural effusion or pneumothorax. Bones demineralized. IMPRESSION: Increased pulmonary infiltrates question pneumonia.  Persistent hilar enlargement mass/adenopathy not excluded; CT chest with contrast recommended for further assessment. Electronically Signed   By: Lavonia Dana M.D.   On: 08/29/2022 14:48    Pending Labs Unresulted Labs (From admission, onward)     Start     Ordered   08/31/22 [id number]  Basic metabolic panel  Tomorrow morning,   R        08/30/22 1151   08/31/22 0500  CBC  Tomorrow morning,   R        08/30/22 1151   08/31/22 0500  Magnesium  Tomorrow morning,   R        08/30/22 1151   08/30/22 0713  Rapid urine drug screen (hospital performed)  ONCE - STAT,   STAT        08/30/22 0713   08/29/22 2122  Expectorated Sputum Assessment w Gram Stain, Rflx to Resp Cult  Once,   R        08/29/22 2121   08/29/22 1352  Urinalysis, Routine w reflex microscopic -Urine, Clean Catch  Once,   URGENT       Question Answer Comment  Specimen Source Urine, Clean Catch   Release to patient Immediate      08/29/22 1351            Vitals/Pain Today's Vitals   08/30/22 1200 08/30/22 1300 08/30/22 1353 08/30/22 1406  BP: (!) 147/74 (!) 156/83    Pulse: 78 78    Resp: 20 (!) 23    Temp:    98.9 F (37.2 C)  TempSrc:      SpO2: 99% 98%    Weight:      Height:      PainSc:   0-No pain     Isolation Precautions No active isolations  Medications Medications  diltiazem (CARDIZEM CD) 24 hr capsule 120 mg (120 mg Oral Given 08/30/22 1111)  furosemide (LASIX) tablet 20 mg (20 mg Oral Given 08/30/22 1045)  fluticasone (FLONASE) 50 MCG/ACT nasal spray 2 spray (2 sprays Each Nare Given 08/30/22 1050)  atorvastatin (LIPITOR) tablet 40 mg (40 mg Oral Given 08/30/22 1046)  DULoxetine (CYMBALTA) DR capsule 60 mg (60 mg Oral Given 08/30/22 1045)  pantoprazole (PROTONIX) EC tablet 40 mg (40 mg Oral Given 08/30/22 1045)  albuterol (PROVENTIL) (2.5 MG/3ML) 0.083% nebulizer solution 2.5 mg (has no administration in time range)  mometasone-formoterol (DULERA) 200-5 MCG/ACT inhaler 2 puff (2 puffs Inhalation Given  08/30/22 1049)  acetaminophen (TYLENOL) tablet 650 mg (650 mg Oral Given 08/30/22 1112)  apixaban (ELIQUIS) tablet 5 mg (5 mg Oral Given 08/30/22 1045)  labetalol (NORMODYNE) tablet 100 mg (100 mg Oral Given 08/30/22 1046)  irbesartan (AVAPRO) tablet 150 mg (150 mg Oral Given 08/30/22 1112)  insulin aspart (novoLOG) injection 0-6 Units ( Subcutaneous Not Given 08/30/22 1244)  piperacillin-tazobactam (ZOSYN) IVPB 3.375 g (3.375 g Intravenous New Bag/Given 08/30/22 1320)  iohexol (OMNIPAQUE) 350 MG/ML injection 50 mL (50 mLs Intravenous Contrast Given 08/29/22 1735)  sodium chloride 0.9 % bolus 500 mL (0 mLs Intravenous Stopped 08/30/22 1214)  potassium chloride SA (KLOR-CON M) CR tablet 40 mEq (40 mEq Oral Given 08/30/22 1316)    Mobility non-ambulatory     Focused Assessments Cardiac Assessment Handoff:    No  results found for: "CKTOTAL", "CKMB", "CKMBINDEX", "TROPONINI" No results found for: "DDIMER" Does the Patient currently have chest pain? No    R Recommendations: See Admitting Provider Note  Report given to:   Additional Notes:

## 2022-08-30 NOTE — ED Notes (Signed)
Pt report received from previous nurse. Pt A&O x4, vitals stable, denies needs/complaints. Call bell in reach. No acute distress noted.  

## 2022-08-30 NOTE — Consult Note (Signed)
NAMEJenesa Blackwell, MRN:  [id number], DOB:  1951/01/21, LOS: 1 ADMISSION DATE:  08/29/2022, CONSULTATION DATE: 08/29/2022 REFERRING MD: Dr. Flossie Buffy, CHIEF COMPLAINT: Shortness of breath, hypoxemic respiratory failure  History of Present Illness:  Patient presented to the hospital with shortness of breath Cough bringing up yellow phlegm, was recently hospitalized early February for pneumonia, sepsis Has had some chest tightness  Known to have A-fib, on anticoagulation  History of COPD, diabetes, hypertension, bronchitis  Recently diagnosed with metastatic cancer from a biopsy of scapula lesion  Known to have a left lung mass, recently had a bronchoscopy in January-patient does not recollect being told about cancer being found from that procedure, notes did not reveal endobronchial lesion on the left side.  Patient was dismissed from oncology practice secondary to multiple missed appointments-has not seen a pulmonologist in a year  She uses oxygen about 2 and half liters at home, same with exertion Uses inhalers  Pertinent  Medical History   Past Medical History:  Diagnosis Date   Bronchitis    COPD (chronic obstructive pulmonary disease) ([place])    Diabetes mellitus without complication ([place])    Hypertension     Significant Hospital Events: Including procedures, antibiotic start and stop dates in addition to other pertinent events   CT reviewed-left upper lobe mass, with probable lymphangitic spread, mediastinal adenopathy PET/CT 05/17/2022-hypermetabolic left upper lobe nodule, hypermetabolic left hilar and mediastinal lymph nodes, hypermetabolic left scapular lesion, hypermetabolic lesion in acetabulum  Interim History / Subjective:  Generalized failure to thrive  Objective   Blood pressure (!) 142/72, pulse 93, temperature 98.8 F (37.1 C), temperature source Oral, resp. rate 20, height '5\' 4"'$  (1.626 m), weight 65.8 kg, SpO2 95 %.        Intake/Output Summary (Last 24 hours) at  08/30/2022 1050 Last data filed at 08/30/2022 0432 Gross per 24 hour  Intake 39.46 ml  Output --  Net 39.46 ml   Filed Weights   08/29/22 1350  Weight: 65.8 kg    Examination: General: Frail elderly female HEENT: MM pink/moist no JVD Neuro: Somewhat stunned CV: Heart sounds are regular PULM: Diminished throughout  GI: soft, bsx4 active  GU: Voids Extremities: warm/dry, 2+ edema  Skin: no rashes or lesions   Resolved Hospital Problem list     Assessment & Plan:  Metastatic lung cancer Progressive disease, metastasis to multiple bones Component of noncompliance She has had a left scapula biopsy I cannot find the results in the chart   Did have recent bronchoscopy -January 16 Vascular lesion noted in bronchus intermedius-was not biopsied Did not see the pathology results from the lymph node aspirations May need repeat fiberoptic bronchoscopy   COPD exacerbation Recent admission for pneumonia, sepsis  Bronchodilators Empirical antimicrobial therapy  Acute on chronic hypoxemic respiratory failure O2 as needed Bronchodilators  Atrial fibrillation Continue Eliquis  Bronchoscopy can be planned for when patient is more stable Pulmonary will continue to follow  Will need to be seen by oncology at some point-metastatic lung cancer   Best Practice (right click and "Reselect all SmartList Selections" daily)   Diet/type: Regular consistency (see orders) DVT prophylaxis: SCD GI prophylaxis: N/A Lines: N/A Foley:  N/A Code Status:  full code Last date of multidisciplinary goals of care discussion [pending]  Labs   CBC: Recent Labs  Lab 08/29/22 1400 08/30/22 0337  WBC 12.5* 10.5  NEUTROABS 9.9*  --   HGB 12.2 10.4*  HCT 38.2 32.6*  MCV 79.4* 78.9*  PLT 575*  450*    Basic Metabolic Panel: Recent Labs  Lab 08/29/22 1400 08/30/22 0337  NA 136 135  K 3.8 3.2*  CL 97* 98  CO2 23 26  GLUCOSE 146* 112*  BUN 10 10  CREATININE 0.90 0.76  CALCIUM  9.1 8.6*   GFR: Estimated Creatinine Clearance: 60.2 mL/min (by C-G formula based on SCr of 0.76 mg/dL). Recent Labs  Lab 08/29/22 1400 08/30/22 0337  WBC 12.5* 10.5    Liver Function Tests: Recent Labs  Lab 08/29/22 1400  AST 18  ALT 12  ALKPHOS 97  BILITOT 0.7  PROT 7.1  ALBUMIN 3.2*   No results for input(s): "LIPASE", "AMYLASE" in the last 168 hours. No results for input(s): "AMMONIA" in the last 168 hours.  ABG    Component Value Date/Time   TCO2 26 07/23/2022 2039     Coagulation Profile: No results for input(s): "INR", "PROTIME" in the last 168 hours.  Cardiac Enzymes: No results for input(s): "CKTOTAL", "CKMB", "CKMBINDEX", "TROPONINI" in the last 168 hours.  HbA1C: Hgb A1c MFr Bld  Date/Time Value Ref Range Status  07/24/2022 12:45 AM 6.3 (H) 4.8 - 5.6 % Final    Comment:    (NOTE) Pre diabetes:          5.7%-6.4%  Diabetes:              >6.4%  Glycemic control for   <7.0% adults with diabetes     CBG: Recent Labs  Lab 08/29/22 1348 08/29/22 2331 08/30/22 0838  GLUCAP 153* 130* [place] Adaora Mchaney ACNP Acute Care Nurse Practitioner [place] Please consult Amion 08/30/2022, 10:50 AM

## 2022-08-31 ENCOUNTER — Other Ambulatory Visit: Payer: Self-pay

## 2022-08-31 ENCOUNTER — Ambulatory Visit
Admission: RE | Admit: 2022-08-31 | Discharge: 2022-08-31 | Disposition: A | Discharge status: Home / Self Care | Payer: Medicare Other | Source: Ambulatory Visit | Attending: Radiation Oncology | Admitting: Radiation Oncology

## 2022-08-31 ENCOUNTER — Ambulatory Visit
Admit: 2022-08-31 | Discharge: 2022-08-31 | Disposition: A | Discharge status: Home / Self Care | Attending: Radiation Oncology | Admitting: Radiation Oncology

## 2022-08-31 DIAGNOSIS — C3402 Malignant neoplasm of left main bronchus: Secondary | ICD-10-CM

## 2022-08-31 DIAGNOSIS — R0602 Shortness of breath: Secondary | ICD-10-CM

## 2022-08-31 DIAGNOSIS — J9621 Acute and chronic respiratory failure with hypoxia: Secondary | ICD-10-CM | POA: Diagnosis not present

## 2022-08-31 DIAGNOSIS — I10 Essential (primary) hypertension: Secondary | ICD-10-CM | POA: Diagnosis not present

## 2022-08-31 DIAGNOSIS — C3412 Malignant neoplasm of upper lobe, left bronchus or lung: Secondary | ICD-10-CM | POA: Diagnosis not present

## 2022-08-31 DIAGNOSIS — C7951 Secondary malignant neoplasm of bone: Secondary | ICD-10-CM

## 2022-08-31 DIAGNOSIS — E119 Type 2 diabetes mellitus without complications: Secondary | ICD-10-CM | POA: Diagnosis not present

## 2022-08-31 DIAGNOSIS — M25519 Pain in unspecified shoulder: Secondary | ICD-10-CM

## 2022-08-31 DIAGNOSIS — R079 Chest pain, unspecified: Secondary | ICD-10-CM

## 2022-08-31 DIAGNOSIS — F419 Anxiety disorder, unspecified: Secondary | ICD-10-CM | POA: Diagnosis not present

## 2022-08-31 LAB — URINALYSIS, ROUTINE W REFLEX MICROSCOPIC
Bilirubin Urine: NEGATIVE
Glucose, UA: NEGATIVE mg/dL
Hgb urine dipstick: NEGATIVE
Ketones, ur: NEGATIVE mg/dL
Leukocytes,Ua: NEGATIVE
Nitrite: NEGATIVE
Protein, ur: NEGATIVE mg/dL
Specific Gravity, Urine: 1.011 (ref 1.005–1.030)
pH: 6 (ref 5.0–8.0)

## 2022-08-31 LAB — GLUCOSE, CAPILLARY
Glucose-Capillary: 138 mg/dL — ABNORMAL HIGH (ref 70–99)
Glucose-Capillary: 167 mg/dL — ABNORMAL HIGH (ref 70–99)
Glucose-Capillary: 144 mg/dL — ABNORMAL HIGH (ref 70–99)
Glucose-Capillary: 119 mg/dL — ABNORMAL HIGH (ref 70–99)

## 2022-08-31 LAB — MAGNESIUM: Magnesium: 1.6 mg/dL — ABNORMAL LOW (ref 1.7–2.4)

## 2022-08-31 LAB — CBC
HCT: 32.7 % — ABNORMAL LOW (ref 36.0–46.0)
Hemoglobin: 10.7 g/dL — ABNORMAL LOW (ref 12.0–15.0)
MCH: 25.2 pg — ABNORMAL LOW (ref 26.0–34.0)
MCHC: 32.7 g/dL (ref 30.0–36.0)
MCV: 76.9 fL — ABNORMAL LOW (ref 80.0–100.0)
Platelets: 409 10*3/uL — ABNORMAL HIGH (ref 150–400)
RBC: 4.25 MIL/uL (ref 3.87–5.11)
RDW: 13.9 % (ref 11.5–15.5)
WBC: 11.8 10*3/uL — ABNORMAL HIGH (ref 4.0–10.5)
nRBC: 0 % (ref 0.0–0.2)

## 2022-08-31 LAB — RAPID URINE DRUG SCREEN, HOSP PERFORMED
Amphetamines: NOT DETECTED
Barbiturates: NOT DETECTED
Benzodiazepines: NOT DETECTED
Cocaine: NOT DETECTED
Opiates: NOT DETECTED
Tetrahydrocannabinol: NOT DETECTED

## 2022-08-31 LAB — BASIC METABOLIC PANEL
Anion gap: 9 (ref 5–15)
BUN: 6 mg/dL — ABNORMAL LOW (ref 8–23)
CO2: 26 mmol/L (ref 22–32)
Calcium: 9.1 mg/dL (ref 8.9–10.3)
Chloride: 100 mmol/L (ref 98–111)
Creatinine, Ser: 0.62 mg/dL (ref 0.44–1.00)
GFR, Estimated: >60 mL/min (ref >60)
Glucose, Bld: 129 mg/dL — ABNORMAL HIGH (ref 70–99)
Potassium: 4 mmol/L (ref 3.5–5.1)
Sodium: 135 mmol/L (ref 135–145)

## 2022-08-31 MED ORDER — GUAIFENESIN-DM 100-10 MG/5ML PO SYRP
5.0000 mL | ORAL_SOLUTION | ORAL | Status: DC | PRN
  Administered 2022-08-31 – 2022-09-08 (×7): 5 mL via ORAL
  Filled 2022-08-31 (×10): qty 10

## 2022-08-31 MED ORDER — ACETAMINOPHEN 325 MG PO TABS
650.0000 mg | ORAL_TABLET | Freq: Three times a day (TID) | ORAL | Status: DC | PRN
  Administered 2022-08-31 – 2022-09-12 (×7): 650 mg via ORAL
  Filled 2022-08-31 (×7): qty 2

## 2022-08-31 MED ORDER — INSULIN ASPART 100 UNIT/ML IJ SOLN
0.0000 [IU] | Freq: Three times a day (TID) | INTRAMUSCULAR | Status: DC
  Administered 2022-09-01: 2 [IU] via SUBCUTANEOUS
  Administered 2022-09-01 – 2022-09-03 (×3): 1 [IU] via SUBCUTANEOUS
  Administered 2022-09-03: 3 [IU] via SUBCUTANEOUS
  Administered 2022-09-05: 1 [IU] via SUBCUTANEOUS
  Administered 2022-09-06: 2 [IU] via SUBCUTANEOUS
  Administered 2022-09-06 – 2022-09-07 (×2): 1 [IU] via SUBCUTANEOUS
  Administered 2022-09-07: 2 [IU] via SUBCUTANEOUS
  Administered 2022-09-08: 1 [IU] via SUBCUTANEOUS
  Administered 2022-09-09: 2 [IU] via SUBCUTANEOUS
  Administered 2022-09-09 – 2022-09-14 (×11): 1 [IU] via SUBCUTANEOUS
  Administered 2022-09-14: 3 [IU] via SUBCUTANEOUS
  Administered 2022-09-14: 1 [IU] via SUBCUTANEOUS

## 2022-08-31 MED ORDER — ATORVASTATIN CALCIUM 40 MG PO TABS
40.0000 mg | ORAL_TABLET | Freq: Every day | ORAL | Status: DC
  Administered 2022-09-01 – 2022-09-14 (×14): 40 mg via ORAL
  Filled 2022-08-31 (×14): qty 1

## 2022-08-31 MED ORDER — DULOXETINE HCL 30 MG PO CPEP
60.0000 mg | ORAL_CAPSULE | Freq: Two times a day (BID) | ORAL | Status: DC
  Administered 2022-08-31 – 2022-09-14 (×29): 60 mg via ORAL
  Filled 2022-08-31: qty 2
  Filled 2022-08-31 (×2): qty 1
  Filled 2022-08-31 (×18): qty 2
  Filled 2022-08-31 (×2): qty 1
  Filled 2022-08-31 (×3): qty 2
  Filled 2022-08-31 (×2): qty 1
  Filled 2022-08-31: qty 2

## 2022-08-31 MED ORDER — GUAIFENESIN-DM 100-10 MG/5ML PO SYRP
5.0000 mL | ORAL_SOLUTION | ORAL | Status: DC | PRN
  Administered 2022-08-31 (×2): 5 mL via ORAL
  Filled 2022-08-31 (×2): qty 5

## 2022-08-31 MED ORDER — IRBESARTAN 150 MG PO TABS
150.0000 mg | ORAL_TABLET | Freq: Two times a day (BID) | ORAL | Status: DC
  Administered 2022-08-31 – 2022-09-09 (×17): 150 mg via ORAL
  Filled 2022-08-31 (×18): qty 1

## 2022-08-31 MED ORDER — MORPHINE SULFATE (PF) 2 MG/ML IV SOLN
2.0000 mg | INTRAVENOUS | Status: DC | PRN
  Administered 2022-09-05 – 2022-09-06 (×4): 2 mg via INTRAVENOUS

## 2022-08-31 MED ORDER — FUROSEMIDE 20 MG PO TABS
20.0000 mg | ORAL_TABLET | Freq: Every day | ORAL | Status: DC
  Administered 2022-09-01 – 2022-09-05 (×5): 20 mg via ORAL
  Filled 2022-08-31 (×5): qty 1

## 2022-08-31 MED ORDER — APIXABAN 5 MG PO TABS
5.0000 mg | ORAL_TABLET | Freq: Two times a day (BID) | ORAL | Status: DC
  Administered 2022-08-31 – 2022-09-01 (×3): 5 mg via ORAL
  Filled 2022-08-31 (×3): qty 1

## 2022-08-31 MED ORDER — ALBUTEROL SULFATE (2.5 MG/3ML) 0.083% IN NEBU
2.5000 mg | INHALATION_SOLUTION | Freq: Four times a day (QID) | RESPIRATORY_TRACT | Status: DC | PRN
  Administered 2022-09-02: 2.5 mg via RESPIRATORY_TRACT
  Filled 2022-08-31: qty 3

## 2022-08-31 MED ORDER — MORPHINE SULFATE (PF) 2 MG/ML IV SOLN
2.0000 mg | INTRAVENOUS | Status: DC | PRN
  Filled 2022-08-31 (×5): qty 1

## 2022-08-31 MED ORDER — MOMETASONE FURO-FORMOTEROL FUM 200-5 MCG/ACT IN AERO
2.0000 | INHALATION_SPRAY | Freq: Two times a day (BID) | RESPIRATORY_TRACT | Status: DC
  Administered 2022-09-01 – 2022-09-14 (×28): 2 via RESPIRATORY_TRACT
  Filled 2022-08-31: qty 8.8

## 2022-08-31 MED ORDER — PIPERACILLIN-TAZOBACTAM 3.375 G IVPB
3.3750 g | Freq: Three times a day (TID) | INTRAVENOUS | Status: DC
  Administered 2022-08-31 – 2022-09-05 (×14): 3.375 g via INTRAVENOUS
  Filled 2022-08-31 (×14): qty 50

## 2022-08-31 MED ORDER — MAGNESIUM SULFATE 2 GM/50ML IV SOLN
2.0000 g | Freq: Once | INTRAVENOUS | Status: AC
  Administered 2022-08-31: 2 g via INTRAVENOUS
  Filled 2022-08-31: qty 50

## 2022-08-31 MED ORDER — PANTOPRAZOLE SODIUM 40 MG PO TBEC
40.0000 mg | DELAYED_RELEASE_TABLET | Freq: Every day | ORAL | Status: DC
  Administered 2022-09-01 – 2022-09-14 (×14): 40 mg via ORAL
  Filled 2022-08-31 (×14): qty 1

## 2022-08-31 MED ORDER — LABETALOL HCL 100 MG PO TABS
100.0000 mg | ORAL_TABLET | Freq: Two times a day (BID) | ORAL | Status: DC
  Administered 2022-08-31 – 2022-09-14 (×27): 100 mg via ORAL
  Filled 2022-08-31 (×28): qty 1

## 2022-08-31 MED ORDER — ORAL CARE MOUTH RINSE: 15.0000 mL | OROMUCOSAL | Status: DC | PRN

## 2022-08-31 MED ORDER — FLUTICASONE PROPIONATE 50 MCG/ACT NA SUSP
2.0000 | Freq: Every day | NASAL | Status: DC
  Administered 2022-08-31 – 2022-09-14 (×11): 2 via NASAL
  Filled 2022-08-31 (×2): qty 16

## 2022-08-31 MED ORDER — DILTIAZEM HCL ER COATED BEADS 120 MG PO CP24
120.0000 mg | ORAL_CAPSULE | Freq: Every day | ORAL | Status: DC
  Administered 2022-09-01 – 2022-09-02 (×2): 120 mg via ORAL
  Filled 2022-08-31 (×3): qty 1

## 2022-08-31 MED ORDER — MAGNESIUM OXIDE -MG SUPPLEMENT 400 (240 MG) MG PO TABS
400.0000 mg | ORAL_TABLET | Freq: Two times a day (BID) | ORAL | Status: DC
  Administered 2022-08-31: 400 mg via ORAL
  Filled 2022-08-31: qty 1

## 2022-08-31 MED ORDER — MAGNESIUM OXIDE -MG SUPPLEMENT 400 (240 MG) MG PO TABS
400.0000 mg | ORAL_TABLET | Freq: Two times a day (BID) | ORAL | Status: DC
  Administered 2022-08-31 – 2022-09-14 (×29): 400 mg via ORAL
  Filled 2022-08-31 (×29): qty 1

## 2022-08-31 NOTE — TOC Progression Note (Signed)
Transition of Care [facility]) - Progression Note    Patient Details  Name: Rhonda Blackwell MRN: [id number] Date of Birth: 06-Aug-1950  Transition of Care [facility]) CM/SW Contact  Zenon Mayo, RN Phone Number: 08/31/2022, 12:05 PM  Clinical Narrative:     from home, pna, on 5 liters, has metastatic dz, iv abx. TOC following.        Expected Discharge Plan and Services                                               Social Determinants of Health (SDOH) Interventions SDOH Screenings   Food Insecurity: No Food Insecurity (08/30/2022)  Housing: Low Risk  (08/30/2022)  Transportation Needs: No Transportation Needs (08/30/2022)  Utilities: Not At Risk (08/30/2022)  Tobacco Use: High Risk (08/29/2022)    Readmission Risk Interventions    07/28/2022    2:35 PM  Readmission Risk Prevention Plan  Transportation Screening Complete  PCP or Specialist Appt within 5-7 Days Complete  Home Care Screening Complete  Medication Review (RN CM) Complete

## 2022-08-31 NOTE — Progress Notes (Signed)
PROGRESS NOTE    Rhonda Blackwell  [id number] DOB: February 25, 1951 DOA: 08/29/2022 PCP: [name], MD    Brief Narrative:  Rhonda Blackwell is a 72 y.o. female with medical history significant of COPD, Type 2 diabetes, CVA, hypertension atrial fibrillation on Eliquis, metastatic disease with unknown primary presented to the hospital with increasing shortness of breath.  Of note, patient was recently admitted between  07/23/22 to 07/27/22 with sepsis secondary to community-acquired multifocal pneumonia.  She was discharged on 2 L nasal cannula at rest and up to 3 L nasal cannula on ambulation.  Patient was also recently diagnosed atrial fibrillation and was on Eliquis.  Ever since her discharge, patient was feeling unwell and had persistent shortness of breath and had ran out of her Eliquis after 30 days.  History of hypermetabolic left upper lung nodule as well as hypermetabolic hilar and mediastinal lymph nodes, left scapular lesion, area of right acetabulum and rectum, concerning for metastatic disease on PET scan.  Patient was followed by [facility] pulmonology/oncology, underwent CT-guided percutaneous biopsy of left scapular lytic bone lesion on 06/08/2022 and bronchoscopy with EBUS on 07/04/2022.  Pt then did not follow up with oncology on multiple appointments.   In the ED, on this presentation, she was afebrile and normotensive.  Initially noted to be in atrial fibrillation with RVR but this  resolved spontaneously.  She was initially was placed on 2 L, however continued have significant oxygen desaturation was ultimately placed on 8 L high flow nasal cannula.  Mild leukocytosis at 12.5.  Troponin was 30. Negative flu/COVID/RSV. CT chest was obtained which showed masslike consolidation within the left upper lobe abutting the left hilar concerning for central obstructing neoplasm.  There is peripheral areas of consolidation and interlobar septal thickening throughout the left upper lobe concerning for  combination of postobstructive changes, regional metastatic disease and lymphangitic spread of tumor.  Mediastinal and hilar adenopathy consistent with nodule metastasis. 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 EDP consulted pulmonology and patient was admitted to the hospital for further evaluation and treatment.  Assessment and Plan:   Acute on chronic respiratory failure with hypoxia  Was on 2 L of oxygen at baseline but needed up to 15 L of oxygen, currently on 4 L of oxygen by high flow nasal cannula.  Patient was discharged from the oncology office due to lack of follow-up appointments.  CT chest this time shows showed masslike consolidation within the left upper lobe abutting the left hilar concerning for central obstructing neoplasm.peripheral areas of consolidation and interlobar septal thickening throughout the left upper lobe concerning for combination of postobstructive changes, regional metastatic disease and lymphangitic spread of tumor.  Been started on IV Zosyn for possible underlying pneumonia.  Symptoms of respiratory distress most likely likely secondary to mechanical obstruction with postobstructive pneumonia..  Pulmonary was consulted and at this time pulmonary recommends consultation with oncology and radiation oncology.  I did communicate with pulmonary, Dr. Marin Olp heme oncology and Dr. Tammi Klippel, radiation oncology today regarding the patient and the plan is to transfer to [facility] long hospital.  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  Hypokalemia improved after replacement.  Latest potassium of 4.0.  Mild hypomagnesemia.  Will replenish the context of respiratory difficulty.  Magnesium oxide p.o. to continue and magnesium sulfate 2 g IV x 1.   Essential hypertension Patient was hypotensive as outpatient.  Currently labetalol irbesartan  Cardizem.  Latest  blood pressure at 162/80.   Type 2 diabetes mellitus without complications Latest hemoglobin A1c of 6.3 on 07/24/2022,  continue sliding scale insulin, Accu-Cheks, latest POC glucose of 138.  Tobacco use Ongoing tobacco abuse.  Counseling done   Chronic anxiety Continue Cymbalta.   Paroxysmal atrial fibrillation with RVR  New diagnosis.  Rate controlled at this time.  Continue Cardizem, labetalol, Eliquis has been resumed.     DVT prophylaxis:  apixaban (ELIQUIS) tablet 5 mg   Code Status:     Code Status: Full Code  Disposition: Uncertain at this time.  Will transfer to [facility] long hospital for continuation of care.  Status is: Inpatient  Remains inpatient appropriate because:  Obstructive bronchial neoplasm, shortness of breath, hypoxic respiratory failure, need for possible radiation treatment, oncology opinion.   Family Communication:  I spoke with the patient's spouse Mr. Orea Roesel on on the phone and updated him about the clinical condition of the patient.  Consultants:  Pulmonary Oncology Radiation oncology  Procedures:  None at this time  Antimicrobials:  Zosyn IV  Anti-infectives (From admission, onward)    Start     Dose/Rate Route Frequency Ordered Stop   08/29/22 2315  piperacillin-tazobactam (ZOSYN) IVPB 3.375 g        3.375 g 12.5 mL/hr over 240 Minutes Intravenous Every 8 hours 08/29/22 2134        Subjective: Today, patient was seen and examined at bedside.  Complains of chest discomfort, shortness of breath, cough.  Motivated to get treated at this time and states that she will follow-up as outpatient.  Appears anxious..  Objective: Vitals:   08/30/22 2006 08/30/22 2028 08/31/22 0054 08/31/22 0837  BP: (!) 187/89  (!) 162/80   Pulse: 96  81   Resp: (!) 34 (!) 30 (!) 23   Temp: 98.2 F (36.8 C)  98.2 F (36.8 C)   TempSrc: Oral  Oral   SpO2: 99% 99% 96% 94%  Weight: 66.3 kg  66.3 kg   Height: 5' (1.524 m)      No intake or output data  in the 24 hours ending 08/31/22 1120  Filed Weights   08/29/22 1350 08/30/22 2006 08/31/22 0054  Weight: 65.8 kg 66.3 kg 66.3 kg    Physical Examination: Body mass index is 28.55 kg/m.   General:  Average built,, mildly anxious, on nasal cannula oxygen, in mild respiratory distress, HENT:   No scleral pallor or icterus noted. Oral mucosa is moist.  Chest: Decreased breath sounds noted.  No obvious wheezes.   CVS: S1 &S2 heard.   Regular rate and rhythm. Abdomen: Soft, nontender, nondistended.  Bowel sounds are heard.   Extremities: No cyanosis, clubbing.  Peripheral pulses are palpable. Psych: Alert, awake and Communicative, mildly anxious, CNS:  No cranial nerve deficits.  Power equal in all extremities.   Skin: Warm and dry.  No rashes noted.  Data Reviewed:   CBC: Recent Labs  Lab 08/29/22 1400 08/30/22 0337 08/31/22 0030  WBC 12.5* 10.5 11.8*  NEUTROABS 9.9*  --   --   HGB 12.2 10.4* 10.7*  HCT 38.2 32.6* 32.7*  MCV 79.4* 78.9* 76.9*  PLT 575* 450* 409*     Basic Metabolic Panel: Recent Labs  Lab 08/29/22 1400 08/30/22 0337 08/31/22 0030  NA 136 135 135  K 3.8 3.2* 4.0  CL 97* 98 100  CO2 '23 26 26  '$ GLUCOSE 146* 112* 129*  BUN 10 10 6*  CREATININE 0.90  0.76 0.62  CALCIUM 9.1 8.6* 9.1  MG  --   --  1.6*     Liver Function Tests: Recent Labs  Lab 08/29/22 1400  AST 18  ALT 12  ALKPHOS 97  BILITOT 0.7  PROT 7.1  ALBUMIN 3.2*      Radiology Studies: CT Chest W Contrast  Result Date: 08/29/2022 CLINICAL DATA:  History of chest pain and diaphoresis, pneumonia EXAM: CT CHEST WITH CONTRAST TECHNIQUE: Multidetector CT imaging of the chest was performed during intravenous contrast administration. RADIATION DOSE REDUCTION: This exam was performed according to the departmental dose-optimization program which includes automated exposure control, adjustment of the mA and/or kV according to patient size and/or use of iterative reconstruction technique.  CONTRAST:  50m OMNIPAQUE IOHEXOL 350 MG/ML SOLN COMPARISON:  08/29/2022 FINDINGS: Cardiovascular: There is a trace pericardial effusion. The heart is not enlarged. Calcifications of the mitral annulus. Diffuse atherosclerosis of the coronary vasculature. Dilated main pulmonary artery measuring 4 cm consistent with pulmonary arterial hypertension. There is insufficient contrast enhancement within the pulmonary vasculature to assess for pulmonary emboli. No evidence of thoracic aortic aneurysm or dissection. Diffuse atherosclerosis of the thoracic aorta without flow limiting stenosis. Mediastinum/Nodes: Thyroid, trachea, and esophagus are unremarkable. Mediastinal and hilar adenopathy, measuring up to 10 mm in the subcarinal region. Lungs/Pleura: There are small bilateral pleural effusions, left greater than right, volume estimated less than 500 cc each. Upper lobe predominant emphysematous changes are noted. No pneumothorax. There is masslike consolidation within the left upper lobe abutting the left hilum, measuring 4.0 x 4.2 cm reference image 54/4, with more peripheral areas of consolidation and interlobular septal thickening elsewhere within the left upper lobe. Findings are more concerning for central neoplasm and lymphangitic spread of disease within the left upper lobe, rather than acute pneumonia, given the additional findings seen within the thoracic cage. Upper Abdomen: No acute abnormality. Musculoskeletal: There are numerous lytic lesions throughout the visualized thoracolumbar spine, most pronounced at T2, T6, T9, and L2. There are associated pathologic compression fractures at T6, T9, and L2, with less than 25% loss of height and no retropulsion. Additional lytic metastatic lesions are seen within the left scapula and bilateral thoracic cage. Pathologic rib fractures are seen within the right fifth and sixth ribs, as well as within the left second, fifth, and sixth ribs. Reconstructed images  demonstrate no additional findings. IMPRESSION: 1. Masslike consolidation within the left upper lobe abutting the left hilum, concerning for central obstructing neoplasm. 2. Peripheral areas of consolidation and interlobular septal thickening throughout the left upper lobe, concerning for a combination of postobstructive changes, regional metastatic disease, and lymphangitic spread of tumor. 3. Mediastinal and hilar adenopathy consistent with nodal metastases. 4. Diffuse lytic bony metastases as above. 5. Small bilateral pleural effusions, left greater than right. 6. Dilated main pulmonary artery consistent with pulmonary arterial hypertension. 7. Trace pericardial effusion. 8. Aortic Atherosclerosis (ICD10-I70.0) and Emphysema (ICD10-J43.9). Electronically Signed   By: MRanda NgoM.D.   On: 08/29/2022 17:59   DG Chest Portable 1 View  Result Date: 08/29/2022 CLINICAL DATA:  Chest pain, diaphoresis EXAM: PORTABLE CHEST 1 VIEW COMPARISON:  Portable exam 1412 hours compared to 07/27/2022 FINDINGS: Normal heart size and pulmonary vascularity. Atherosclerotic calcification aorta. Enlargement of RIGHT hilum, questionably LEFT hilum. Emphysematous changes with increased opacity in LEFT upper lobe and BILATERAL lower lobe since previous study question infiltrate. No pleural effusion or pneumothorax. Bones demineralized. IMPRESSION: Increased pulmonary infiltrates question pneumonia. Persistent hilar enlargement mass/adenopathy not excluded; CT  chest with contrast recommended for further assessment. Electronically Signed   By: Lavonia Dana M.D.   On: 08/29/2022 14:48      LOS: 2 days    Flora Lipps, MD [facility] Available via Epic secure chat 7am-7pm After these hours, please refer to coverage provider listed on [email or web address] 08/31/2022, 11:20 AM

## 2022-08-31 NOTE — Consult Note (Addendum)
This is a consult note for [employer].  She is a 72 year old woman from [place].  She has been married for over 40 years.  She married a GI who was in the [employer].  She and he has lived in multiple locations due to his [employer].  It is very difficult to get exact history from her.  As such, most history comes from other notes.  She apparently has had issues with breathing.  She has been treated at [facility] in [facility].  She actually had all of her workup there.  Everything started last year.  She was having shortness of breath and chest pain.  She is having issues with pain in the left shoulder.  She ultimately had a PET scan done.  This was done about 3 months ago.  This showed that she had a hypermetabolic left upper lobe nodule measuring 1.25 mm.  She had hypermetabolic left hilar mediastinal lymph nodes.  She had hypermetabolic activity in the rectum without discrete CT correlate.  She has metabolic lesions in the left scapula.  Also noted was an area in the right acetabulum.  She had a biopsy of the left scapular lesion.  This was done in December 2023.  The pathology report [id number]) showed a poorly differentiated carcinoma.  However, the pathologist felt that this was consistent with a breast cancer.  Estrogen receptor was negative.  Progesterone receptor was negative.  The HER2 was equivocal.  I cannot find any additional studies on FISH.  She has had a mammogram which was negative.  She is a smoker.  She stopped in February.  She has been seen by pulmonary at [facility].  They did do a bronchoscopy on her.  I think this was done in January.  There is nothing that was found endobronchial.  I think biopsies were done of some mediastinal nodes.  I am sure the pathology is somewhere in the system.  She had an MRI of the brain which was negative.  She was supposed to see Oncology at [facility].  She unfortunately Missed her appointments.  She has been at [facility]  She apparently  was admitted back in February with pneumonia.  She really has not gotten much better.  She subsequently was admitted on 08/29/2022 with pneumonia again.  This time, she had a CT scan.  This clearly showed progression of this disease.  She had masslike consolidation within the left upper lobe abutting the left hilum.  I think the mass measured 4 x 4.2 cm.  She had mediastinal hilar adenopathy.  She had diffuse lytic bony lesions.  She had postobstructive changes and possible lymphangitic spread.  She was sent over to [facility] to see about palliative radiation.  Pulmonary has seen her.  They do not feel that she needs any bronchoscopy as she just had bronchoscopy couple months ago.  We are now asked to see her to help in management.  It is hard to say when she really understands what is going on.  She clearly has stage IV malignancy.  I have to believe that we are dealing with a poorly differentiated lung cancer.  Again, I really need to get pathology and look at the molecular studies.  It would be really nice to get a PET scan on her now.  Unfortunately with her being in the hospital, this will not happen.  She is having a little bit of discomfort in the left shoulder.  She has little bit of  shortness of breath.   Her vital signs show temperature of 98.6.  Pulse 76.  Blood pressure 138/70.  Her weight is 144 pounds. Physical Exam Vitals reviewed.  HENT:     Head: Normocephalic and atraumatic.  Eyes:     Pupils: Pupils are equal, round, and reactive to light.  Cardiovascular:     Rate and Rhythm: Normal rate and regular rhythm.     Heart sounds: Normal heart sounds.  Pulmonary:     Effort: Pulmonary effort is normal.     Breath sounds: Normal breath sounds.     Comments: Lungs sound relatively clear on the right side.  She has wheezes and rhonchi on the left side.  She has decent air movement however. Abdominal:     General: Bowel sounds are normal.     Palpations: Abdomen  is soft.  Musculoskeletal:        General: No tenderness or deformity. Normal range of motion.     Cervical back: Normal range of motion.  Lymphadenopathy:     Cervical: No cervical adenopathy.  Skin:    General: Skin is warm and dry.     Findings: No erythema or rash.  Neurological:     Mental Status: She is alert and oriented to person, place, and time.  Psychiatric:        Behavior: Behavior normal.        Thought Content: Thought content normal.        Judgment: Judgment normal.      Her labs today show white cell count of 11.8.  Hemoglobin 10.7.  Platelet count 49,000.  MCV is 79.  Her sodium is 135.  Potassium 4.0.  BUN 6 creatinine 0.62.  Calcium 9.1 with an albumin of 3.2.  I find it incredibly interesting that the PET scan that she had done 3 months ago showed activity in the rectum.  I think she really needs to have a colonoscopy to see if there is actually a mass down there.  Again, we really need to see the pathology from the mediastinal nodes.  I realized that Pulmonary does not want to do a bronchoscopy on her.  However, this might be something that needs to be done.  I will know if she needs any kind of stent placed.  To me, the workup that she had done was at least 2 months ago.  It certainly seems as if she has a fairly quick tumor progression.  I agree with the palliative radiation for right now.  As far as any systemic chemotherapy, I really need to see the pathology and what molecular markers have been sent already.  I will send off iron studies.  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  She is very nice.  Unfortunately, I think she has a very tough problem.  I know were not going to cure this.  We really need to make sure her quality of life is the priority.  We will follow her along.   [name], MD  Jeneen Rinks 1:5

## 2022-08-31 NOTE — Consult Note (Signed)
Radiation Oncology         [phone] ________________________________  Initial inpatient Consultation  Name: Rhonda Blackwell MRN: [id number]  Date of Service: 08/29/2022 DOB: [birth year]  PO:[id number], Corrie Mckusick, LD; Burney Gauze, MD  REFERRING PHYSICIAN: Flora Lipps, LD  DIAGNOSIS: 72 y/o woman with airway obstruction and painful osseous metastases secondary to stage IV NSCLC, adenocarcinoma.    ICD-10-CM   1. Hypoxia  R09.02     2. Atrial fibrillation with RVR (HCC)  I48.91     3. Lung mass  R91.8     4. Paroxysmal atrial fibrillation with RVR (HCC)  I48.0       HISTORY OF PRESENT ILLNESS: Rhonda Blackwell is a 72 y.o. female seen at the request of Dr. Louanne Belton. She has had increasing shortness of breath and mid back and should pain for at least 6 months now and has had work up through [facility] with multiple imaging studies, including a PET scan on 05/24/2022 and biopsy of a lytic left scapular lesion on 06/09/23 and bronchoscopy with EBUS on 07/04/22. This ultimately confirmed metastatic lung cancer but she did not follow up with medical oncology on multiple appointments thereafter. She presented to [facility] ED on 08/28/21 with increased shortness of breath requiring 8 L O2 via high flow nasal canula. CT chest was performed and showed masslike consolidation measuring 4.2 cm within the left upper lobe, abutting the left hilum as well as more peripheral areas of consolidation and interlobular septal thickening elsewhere within the left upper lobe, concerning for central neoplasm and lymphangitic spread of disease within the left upper lobe. Additionally, it showed mediastinal and hilar lymphadenopathy, small bilateral pleural effusions and diffuse lytic bony metastases throughout the thoracolumbar spine, left scapula and multiple ribs. She did have a brain MRI on 06/29/22 that did not show any evidence of intracranial metastases but there was an indeterminate subcentimeter right  parieto-occipital skull lesion.   We have been asked to consult this patient to discuss the potential role of palliative radiation in the management of her disease.  PREVIOUS RADIATION THERAPY: No  PAST MEDICAL HISTORY:  Past Medical History:  Diagnosis Date   Bronchitis    COPD (chronic obstructive pulmonary disease) ([place])    Diabetes mellitus without complication (HCC)    Hypertension       PAST SURGICAL HISTORY: Past Surgical History:  Procedure Laterality Date   CESAREAN SECTION     CHOLECYSTECTOMY     FOOT SURGERY     HERNIA REPAIR      FAMILY HISTORY:  Family History  Problem Relation Age of Onset   Diabetes Mellitus II Sister     SOCIAL HISTORY:  Social History   Socioeconomic History   Marital status: Married    Spouse name: Not on file   Number of children: Not on file   Years of education: Not on file   Highest education level: Not on file  Occupational History   Not on file  Tobacco Use   Smoking status: Some Days    Packs/day: 1    Types: Cigarettes   Smokeless tobacco: Never  Substance and Sexual Activity   Alcohol use: No   Drug use: No   Sexual activity: Not on file  Other Topics Concern   Not on file  Social History Narrative   Not on file   Social Determinants of Health   Financial Resource Strain: Not on file  Food Insecurity: No Food Insecurity (08/30/2022)   Hunger Vital  Sign    Worried About Charity fundraiser in the Last Year: Never true    [place] in the Last Year: Never true  Transportation Needs: No Transportation Needs (08/30/2022)   PRAPARE - Hydrologist (Medical): No    Lack of Transportation (Non-Medical): No  Physical Activity: Not on file  Stress: Not on file  Social Connections: Not on file  Intimate Partner Violence: Not At Risk (08/30/2022)   Humiliation, Afraid, Rape, and Kick questionnaire    Fear of Current or Ex-Partner: No    Emotionally Abused: No    Physically Abused:  No    Sexually Abused: No    ALLERGIES: Bystolic [nebivolol hcl], Hydrochlorothiazide, and Norvasc [amlodipine]  MEDICATIONS:  Current Facility-Administered Medications  Medication Dose Route Frequency Provider Last Rate Last Admin   acetaminophen (TYLENOL) tablet 650 mg  650 mg Oral Q8H PRN Eudelia Bunch, RPH   650 mg at 08/31/22 2121   albuterol (PROVENTIL) (2.5 MG/3ML) 0.083% nebulizer solution 2.5 mg  2.5 mg Inhalation Q6H PRN Leodis Sias T, RPH       apixaban (ELIQUIS) tablet 5 mg  5 mg Oral BID Leodis Sias T, RPH   5 mg at 08/31/22 2121   [START ON 09/01/2022] atorvastatin (LIPITOR) tablet 40 mg  40 mg Oral Daily Eudelia Bunch, RPH       [START ON 09/01/2022] diltiazem (CARDIZEM CD) 24 hr capsule 120 mg  120 mg Oral Daily Leodis Sias T, RPH       DULoxetine (CYMBALTA) DR capsule 60 mg  60 mg Oral BID Eudelia Bunch, RPH   60 mg at 08/31/22 2121   fluticasone (FLONASE) 50 MCG/ACT nasal spray 2 spray  2 spray Each Nare Daily Eudelia Bunch, RPH   2 spray at 08/31/22 2125   [START ON 09/01/2022] furosemide (LASIX) tablet 20 mg  20 mg Oral Daily Bell, Michelle T, RPH       guaiFENesin-dextromethorphan (ROBITUSSIN DM) 100-10 MG/5ML syrup 5 mL  5 mL Oral Q4H PRN Pokhrel, Laxman, MD   5 mL at 08/31/22 1947   [START ON 09/01/2022] insulin aspart (novoLOG) injection 0-6 Units  0-6 Units Subcutaneous TID WC Bell, Michelle T, RPH       irbesartan (AVAPRO) tablet 150 mg  150 mg Oral BID Eudelia Bunch, RPH   150 mg at 08/31/22 2121   labetalol (NORMODYNE) tablet 100 mg  100 mg Oral BID Eudelia Bunch, RPH   100 mg at 08/31/22 2122   magnesium oxide (MAG-OX) tablet 400 mg  400 mg Oral BID Eudelia Bunch, RPH   400 mg at 08/31/22 2121   mometasone-formoterol (DULERA) 200-5 MCG/ACT inhaler 2 puff  2 puff Inhalation BID Leodis Sias T, RPH       morphine (PF) 2 MG/ML injection 2 mg  2 mg Intravenous Q3H PRN Pokhrel, Laxman, MD       morphine (PF) 2 MG/ML injection 2 mg  2 mg  Intravenous Q3H PRN Eudelia Bunch, RPH       Oral care mouth rinse  15 mL Mouth Rinse PRN Leodis Sias T, RPH       [START ON 09/01/2022] pantoprazole (PROTONIX) EC tablet 40 mg  40 mg Oral Daily Leodis Sias T, RPH       piperacillin-tazobactam (ZOSYN) IVPB 3.375 g  3.375 g Intravenous Q8H BellSharyn Lull T, RPH 12.5 mL/hr at 08/31/22 2132 3.375 g at 08/31/22 2132  REVIEW OF SYSTEMS:  On review of systems, the patient reports that she is doing fair in general but is suffering with back and left should pain. The pain is in her mid-back and has been present for several months now, progressively worsening. She denies paraesthesias in the upper or lower extremities, focal weakness or difficulty controlling her bowel or bladder. She denies any chest pain, increased shortness of breath since admission, cough, fevers, chills, night sweats, or recent unintended weight changes. She denies any bowel or bladder disturbances, and denies abdominal pain, nausea or vomiting. She denies any new musculoskeletal or joint aches or pains aside from that mentioned previously. A complete review of systems is obtained and is otherwise negative.    PHYSICAL EXAM:  Wt Readings from Last 3 Encounters:  08/31/22 144 lb 2.9 oz (65.4 kg)  07/24/22 153 lb 7 oz (69.6 kg)  08/02/15 169 lb 14.4 oz (77.1 kg)   Temp Readings from Last 3 Encounters:  08/31/22 98.9 F (37.2 C) (Oral)  07/28/22 98.3 F (36.8 C) (Oral)  05/04/16 98.2 F (36.8 C) (Oral)   BP Readings from Last 3 Encounters:  08/31/22 (!) 156/78  07/28/22 116/72  05/04/16 135/70   Pulse Readings from Last 3 Encounters:  08/31/22 95  07/28/22 81  05/04/16 66   Pain Assessment Pain Score: 0-No pain/10  In general this is a well appearing woman in no acute distress. She's alert and oriented x4 and appropriate throughout the examination. Cardiopulmonary assessment is negative for acute distress and she exhibits normal effort. Sensation is intact  bilaterally in the upper and lower extremities and she is able to move all extremities without difficulty.  KPS = 30  100 - Normal; no complaints; no evidence of disease. 90   - Able to carry on normal activity; minor signs or symptoms of disease. 80   - Normal activity with effort; some signs or symptoms of disease. 60   - Cares for self; unable to carry on normal activity or to do active work. 60   - Requires occasional assistance, but is able to care for most of his personal needs. 50   - Requires considerable assistance and frequent medical care. 47   - Disabled; requires special care and assistance. 24   - Severely disabled; hospital admission is indicated although death not imminent. 31   - Very sick; hospital admission necessary; active supportive treatment necessary. 10   - Moribund; fatal processes progressing rapidly. 0     - Dead  Karnofsky DA, Abelmann Taft, Craver LS and Burchenal [facility] [phone]) The use of the nitrogen mustards in the palliative treatment of carcinoma: with particular reference to bronchogenic carcinoma Cancer 1 [phone]  LABORATORY DATA:  Lab Results  Component Value Date   WBC 11.8 (H) 08/31/2022   HGB 10.7 (L) 08/31/2022   HCT 32.7 (L) 08/31/2022   MCV 76.9 (L) 08/31/2022   PLT 409 (H) 08/31/2022   Lab Results  Component Value Date   NA 135 08/31/2022   K 4.0 08/31/2022   CL 100 08/31/2022   CO2 26 08/31/2022   Lab Results  Component Value Date   ALT 12 08/29/2022   AST 18 08/29/2022   ALKPHOS 97 08/29/2022   BILITOT 0.7 08/29/2022     RADIOGRAPHY: CT Chest W Contrast  Result Date: 08/29/2022 CLINICAL DATA:  History of chest pain and diaphoresis, pneumonia EXAM: CT CHEST WITH CONTRAST TECHNIQUE: Multidetector CT imaging of the chest was performed during intravenous contrast administration. RADIATION  DOSE REDUCTION: This exam was performed according to the departmental dose-optimization program which includes automated exposure control, adjustment of  the mA and/or kV according to patient size and/or use of iterative reconstruction technique. CONTRAST:  41m OMNIPAQUE IOHEXOL 350 MG/ML SOLN COMPARISON:  08/29/2022 FINDINGS: Cardiovascular: There is a trace pericardial effusion. The heart is not enlarged. Calcifications of the mitral annulus. Diffuse atherosclerosis of the coronary vasculature. Dilated main pulmonary artery measuring 4 cm consistent with pulmonary arterial hypertension. There is insufficient contrast enhancement within the pulmonary vasculature to assess for pulmonary emboli. No evidence of thoracic aortic aneurysm or dissection. Diffuse atherosclerosis of the thoracic aorta without flow limiting stenosis. Mediastinum/Nodes: Thyroid, trachea, and esophagus are unremarkable. Mediastinal and hilar adenopathy, measuring up to 10 mm in the subcarinal region. Lungs/Pleura: There are small bilateral pleural effusions, left greater than right, volume estimated less than 500 cc each. Upper lobe predominant emphysematous changes are noted. No pneumothorax. There is masslike consolidation within the left upper lobe abutting the left hilum, measuring 4.0 x 4.2 cm reference image 54/4, with more peripheral areas of consolidation and interlobular septal thickening elsewhere within the left upper lobe. Findings are more concerning for central neoplasm and lymphangitic spread of disease within the left upper lobe, rather than acute pneumonia, given the additional findings seen within the thoracic cage. Upper Abdomen: No acute abnormality. Musculoskeletal: There are numerous lytic lesions throughout the visualized thoracolumbar spine, most pronounced at T2, T6, T9, and L2. There are associated pathologic compression fractures at T6, T9, and L2, with less than 25% loss of height and no retropulsion. Additional lytic metastatic lesions are seen within the left scapula and bilateral thoracic cage. Pathologic rib fractures are seen within the right fifth and sixth  ribs, as well as within the left second, fifth, and sixth ribs. Reconstructed images demonstrate no additional findings. IMPRESSION: 1. Masslike consolidation within the left upper lobe abutting the left hilum, concerning for central obstructing neoplasm. 2. Peripheral areas of consolidation and interlobular septal thickening throughout the left upper lobe, concerning for a combination of postobstructive changes, regional metastatic disease, and lymphangitic spread of tumor. 3. Mediastinal and hilar adenopathy consistent with nodal metastases. 4. Diffuse lytic bony metastases as above. 5. Small bilateral pleural effusions, left greater than right. 6. Dilated main pulmonary artery consistent with pulmonary arterial hypertension. 7. Trace pericardial effusion. 8. Aortic Atherosclerosis (ICD10-I70.0) and Emphysema (ICD10-J43.9). Electronically Signed   By: MRanda NgoM.D.   On: 08/29/2022 17:59   DG Chest Portable 1 View  Result Date: 08/29/2022 CLINICAL DATA:  Chest pain, diaphoresis EXAM: PORTABLE CHEST 1 VIEW COMPARISON:  Portable exam 1412 hours compared to 07/27/2022 FINDINGS: Normal heart size and pulmonary vascularity. Atherosclerotic calcification aorta. Enlargement of RIGHT hilum, questionably LEFT hilum. Emphysematous changes with increased opacity in LEFT upper lobe and BILATERAL lower lobe since previous study question infiltrate. No pleural effusion or pneumothorax. Bones demineralized. IMPRESSION: Increased pulmonary infiltrates question pneumonia. Persistent hilar enlargement mass/adenopathy not excluded; CT chest with contrast recommended for further assessment. Electronically Signed   By: MLavonia DanaM.D.   On: 08/29/2022 14:48      IMPRESSION/PLAN: 1. 72y.o. woman with airway obstruction and painful osseous metastases secondary to stage IV NSCLC, adenocarcinoma.  Today, we talked to the patient and her husband, Rhonda Blackwell about the findings and workup thus far. We discussed the natural  history of metastatic lung cancer and general treatment, highlighting the role of radiotherapy in the management. We discussed the available radiation techniques, and focused  on the details and logistics of delivery. The recommendation is for a 2 week course of daily, palliative radiation to the chest and painful bony metastasis at T6. We reviewed the anticipated acute and late sequelae associated with radiation in this setting. The patient was encouraged to ask questions that were answered to her stated satisfaction.  At the conclusion of our conversation, the patient is in agreement to proceed with the recommended 2 week course of daily, palliative radiation to the chest and painful bony metastasis at T6.  She has been transferred over to [facility] for ease of access to the [place] to begin treatments as soon as possible. She is scheduled for CT SIM/treatment planning this afternoon at 3:30pm in anticipation of beginning her 1st treatment on Friday, 09/01/22 and then resuming the daily treatments on Monday, 09/04/22. We would like to get a few treatments in while she is in-patient and if she is feeling better and stable for discharge prior to completing the full 2 week course of treatment, we discussed that we can certainly continue her treatments on an outpatient basis. We enjoyed meeting her today and look forward to continuing to participate in her care.  We personally spent 70 minutes in this encounter including chart review, reviewing radiological studies, meeting face-to-face with the patient, entering orders and completing documentation.    Nicholos Johns, PA-C    Tyler Pita, MD  [facility] Direct Dial: [phone]  Fax: [phone] [email or web address]  Skype  [email or web address]

## 2022-08-31 NOTE — Progress Notes (Signed)
  Radiation Oncology         [phone] ________________________________  Name: Rhonda Blackwell MRN: [id number]  Date: 08/31/2022  DOB: 04/14/1951  INPATIENT  SIMULATION AND TREATMENT PLANNING NOTE    ICD-10-CM   1. Malignant neoplasm of hilus of left lung (HCC)  C34.02       DIAGNOSIS:  72 yo woman with left-sided airway obstruction from malignancy  NARRATIVE:  The patient was brought to the [facility].  Identity was confirmed.  All relevant records and images related to the planned course of therapy were reviewed.  The patient freely provided informed written consent to proceed with treatment after reviewing the details related to the planned course of therapy. The consent form was witnessed and verified by the simulation staff.  Then, the patient was set-up in a stable reproducible  supine position for radiation therapy.  CT images were obtained.  Surface markings were placed.  The CT images were loaded into the planning software.  Then the target and avoidance structures were contoured.  Treatment planning then occurred.  The radiation prescription was entered and confirmed.  Then, I designed and supervised the construction of a total of 6 medically necessary complex treatment devices, including a BodyFix immobilization mold custom fitted to the patient along with 5 multileaf collimators conformally shaped radiation around the treatment target while shielding critical structures such as the heart and spinal cord maximally.  I have requested : 3D Simulation  I have requested a DVH of the following structures: Left lung, right lung, spinal cord, heart, esophagus, and target.  I have ordered:Nutrition Consult  PLAN:  The patient will receive 6 Gy in 1 fraction and then 24 Gy in 8 more fractions of 3 Gy.  ________________________________  Sheral Apley Tammi Klippel, M.D.

## 2022-08-31 NOTE — Progress Notes (Signed)
NAMEDanise Blackwell, MRN:  [id number], DOB:  08-06-1950, LOS: 2 ADMISSION DATE:  08/29/2022, CONSULTATION DATE: 08/29/2022 REFERRING MD: Dr. Flossie Buffy, CHIEF COMPLAINT: Shortness of breath, hypoxemic respiratory failure  History of Present Illness:  Patient presented to the hospital with shortness of breath Cough bringing up yellow phlegm, was recently hospitalized early February for pneumonia, sepsis Has had some chest tightness  Known to have A-fib, on anticoagulation  History of COPD, diabetes, hypertension, bronchitis  Recently diagnosed with metastatic cancer from a biopsy of scapula lesion  Known to have a left lung mass, recently had a bronchoscopy in January-patient does not recollect being told about cancer being found from that procedure, notes did not reveal endobronchial lesion on the left side.  Patient was dismissed from oncology practice secondary to multiple missed appointments-has not seen a pulmonologist in a year  She uses oxygen about 2 and half liters at home, same with exertion Uses inhalers  Pertinent  Medical History   Past Medical History:  Diagnosis Date   Bronchitis    COPD (chronic obstructive pulmonary disease) ([place])    Diabetes mellitus without complication ([place])    Hypertension     Significant Hospital Events: Including procedures, antibiotic start and stop dates in addition to other pertinent events   CT reviewed-left upper lobe mass, with probable lymphangitic spread, mediastinal adenopathy PET/CT 05/17/2022-hypermetabolic left upper lobe nodule, hypermetabolic left hilar and mediastinal lymph nodes, hypermetabolic left scapular lesion, hypermetabolic lesion in acetabulum  Interim History / Subjective:  Pulmonary critical care will be available as needed  Objective   Blood pressure (!) 162/80, pulse 81, temperature 98.2 F (36.8 C), temperature source Oral, resp. rate (!) 23, height 5' (1.524 m), weight 66.3 kg, SpO2 96 %.       No intake or output  data in the 24 hours ending 08/31/22 0838  Filed Weights   08/29/22 1350 08/30/22 2006 08/31/22 0054  Weight: 65.8 kg 66.3 kg 66.3 kg    Examination: General: Sitting up on side of the bed eating HEENT: MM pink/moist no JVD is appreciated Neuro: Grossly intact CV: Heart sounds are regular PULM: Diminished throughout GI: soft, bsx4 active  GU: Voids Extremities: warm/dry, 2+ edema  Skin: no rashes or lesions    Resolved Hospital Problem list     Assessment & Plan:  Metastatic lung cancer Progressive disease, metastasis to multiple bones Oncology has been called in to evaluate   Did have recent bronchoscopy -January 16 Vascular lesion noted in bronchus intermedius-was not biopsied Sp fob with bx results noted Oncology to see   COPD exacerbation Recent admission for pneumonia, sepsis  Continue bronchodilators and empirical antimicrobial therapy  Acute on chronic hypoxemic respiratory failure O2 as needed  Atrial fibrillation Continue Eliquis  Oncology has been called pulmonary critical care will be available as needed   Best Practice (right click and "Reselect all SmartList Selections" daily)   Diet/type: Regular consistency (see orders) DVT prophylaxis: SCD GI prophylaxis: N/A Lines: N/A Foley:  N/A Code Status:  full code Last date of multidisciplinary goals of care discussion [pending]  Labs   CBC: Recent Labs  Lab 08/29/22 1400 08/30/22 0337 08/31/22 0030  WBC 12.5* 10.5 11.8*  NEUTROABS 9.9*  --   --   HGB 12.2 10.4* 10.7*  HCT 38.2 32.6* 32.7*  MCV 79.4* 78.9* 76.9*  PLT 575* 450* 409*    Basic Metabolic Panel: Recent Labs  Lab 08/29/22 1400 08/30/22 0337 08/31/22 0030  NA 136 135 135  K 3.8 3.2* 4.0  CL 97* 98 100  CO2 '23 26 26  '$ GLUCOSE 146* 112* 129*  BUN 10 10 6*  CREATININE 0.90 0.76 0.62  CALCIUM 9.1 8.6* 9.1  MG  --   --  1.6*   GFR: Estimated Creatinine Clearance: 54.8 mL/min (by C-G formula based on SCr of 0.62  mg/dL). Recent Labs  Lab 08/29/22 1400 08/30/22 0337 08/31/22 0030  WBC 12.5* 10.5 11.8*    Liver Function Tests: Recent Labs  Lab 08/29/22 1400  AST 18  ALT 12  ALKPHOS 97  BILITOT 0.7  PROT 7.1  ALBUMIN 3.2*   No results for input(s): "LIPASE", "AMYLASE" in the last 168 hours. No results for input(s): "AMMONIA" in the last 168 hours.  ABG    Component Value Date/Time   TCO2 26 07/23/2022 2039     Coagulation Profile: No results for input(s): "INR", "PROTIME" in the last 168 hours.  Cardiac Enzymes: No results for input(s): "CKTOTAL", "CKMB", "CKMBINDEX", "TROPONINI" in the last 168 hours.  HbA1C: Hgb A1c MFr Bld  Date/Time Value Ref Range Status  07/24/2022 12:45 AM 6.3 (H) 4.8 - 5.6 % Final    Comment:    (NOTE) Pre diabetes:          5.7%-6.4%  Diabetes:              >6.4%  Glycemic control for   <7.0% adults with diabetes     CBG: Recent Labs  Lab 08/29/22 2331 08/30/22 0838 08/30/22 1228 08/30/22 2238 08/31/22 0608  GLUCAP 130* 118* 145* 110* 138*     Steve Makaylah Oddo ACNP Acute Care Nurse Practitioner [place] Please consult Amion 08/31/2022, 8:38 AM

## 2022-09-01 ENCOUNTER — Other Ambulatory Visit: Payer: Self-pay

## 2022-09-01 ENCOUNTER — Inpatient Hospital Stay (HOSPITAL_COMMUNITY): Payer: Medicare Other

## 2022-09-01 ENCOUNTER — Encounter: Payer: Self-pay | Admitting: *Deleted

## 2022-09-01 ENCOUNTER — Ambulatory Visit
Admission: RE | Admit: 2022-09-01 | Discharge: 2022-09-01 | Disposition: A | Discharge status: Home / Self Care | Payer: Medicare Other | Source: Ambulatory Visit | Attending: Radiation Oncology | Admitting: Radiation Oncology

## 2022-09-01 ENCOUNTER — Encounter: Payer: Self-pay | Admitting: Urology

## 2022-09-01 DIAGNOSIS — R918 Other nonspecific abnormal finding of lung field: Secondary | ICD-10-CM | POA: Diagnosis not present

## 2022-09-01 DIAGNOSIS — I4891 Unspecified atrial fibrillation: Secondary | ICD-10-CM | POA: Diagnosis not present

## 2022-09-01 DIAGNOSIS — J9621 Acute and chronic respiratory failure with hypoxia: Secondary | ICD-10-CM | POA: Diagnosis not present

## 2022-09-01 LAB — BASIC METABOLIC PANEL
Anion gap: 12 (ref 5–15)
BUN: 10 mg/dL (ref 8–23)
CO2: 30 mmol/L (ref 22–32)
Calcium: 9.2 mg/dL (ref 8.9–10.3)
Chloride: 94 mmol/L — ABNORMAL LOW (ref 98–111)
Creatinine, Ser: 0.57 mg/dL (ref 0.44–1.00)
GFR, Estimated: >60 mL/min (ref >60)
Glucose, Bld: 145 mg/dL — ABNORMAL HIGH (ref 70–99)
Potassium: 3.6 mmol/L (ref 3.5–5.1)
Sodium: 136 mmol/L (ref 135–145)

## 2022-09-01 LAB — GLUCOSE, CAPILLARY
Glucose-Capillary: 127 mg/dL — ABNORMAL HIGH (ref 70–99)
Glucose-Capillary: 147 mg/dL — ABNORMAL HIGH (ref 70–99)
Glucose-Capillary: 159 mg/dL — ABNORMAL HIGH (ref 70–99)
Glucose-Capillary: 208 mg/dL — ABNORMAL HIGH (ref 70–99)

## 2022-09-01 LAB — RAD ONC ARIA SESSION SUMMARY
Course Elapsed Days: 0
Plan Fractions Treated to Date: 1
Plan Prescribed Dose Per Fraction: 6 Gy
Plan Total Fractions Prescribed: 1
Plan Total Prescribed Dose: 6 Gy
Reference Point Dosage Given to Date: 6 Gy
Reference Point Session Dosage Given: 6 Gy
Session Number: 1

## 2022-09-01 LAB — CBC
HCT: 34 % — ABNORMAL LOW (ref 36.0–46.0)
Hemoglobin: 10.8 g/dL — ABNORMAL LOW (ref 12.0–15.0)
MCH: 24.8 pg — ABNORMAL LOW (ref 26.0–34.0)
MCHC: 31.8 g/dL (ref 30.0–36.0)
MCV: 78.2 fL — ABNORMAL LOW (ref 80.0–100.0)
Platelets: 426 10*3/uL — ABNORMAL HIGH (ref 150–400)
RBC: 4.35 MIL/uL (ref 3.87–5.11)
RDW: 13.9 % (ref 11.5–15.5)
WBC: 10.7 10*3/uL — ABNORMAL HIGH (ref 4.0–10.5)
nRBC: 0 % (ref 0.0–0.2)

## 2022-09-01 LAB — IRON AND TIBC
Iron: 27 ug/dL — ABNORMAL LOW (ref 28–170)
Saturation Ratios: 11 % (ref 10.4–31.8)
TIBC: 244 ug/dL — ABNORMAL LOW (ref 250–450)
UIBC: 217 ug/dL

## 2022-09-01 LAB — MAGNESIUM: Magnesium: 2 mg/dL (ref 1.7–2.4)

## 2022-09-01 LAB — PREALBUMIN: Prealbumin: 16 mg/dL — ABNORMAL LOW (ref 18–38)

## 2022-09-01 LAB — CANCER ANTIGEN 27.29: CA 27.29: 19.3 U/mL (ref 0.0–38.6)

## 2022-09-01 MED ORDER — TECHNETIUM TC 99M MEDRONATE IV KIT
20.0000 | PACK | Freq: Once | INTRAVENOUS | Status: AC | PRN
  Administered 2022-09-01: 21.5 via INTRAVENOUS

## 2022-09-01 NOTE — Progress Notes (Signed)
PROGRESS NOTE    Rhonda Blackwell  [id number] DOB: 02/26/1951 DOA: 08/29/2022 PCP: [name], MD    Brief Narrative:  Rhonda Blackwell is a 72 y.o. female with medical history significant of COPD, Type 2 diabetes, CVA, hypertension atrial fibrillation on Eliquis, metastatic disease with unknown primary presented to the hospital with increasing shortness of breath.  Of note, patient was recently admitted between  07/23/22 to 07/27/22 with sepsis secondary to community-acquired multifocal pneumonia.  She was discharged on 2 L nasal cannula at rest and up to 3 L nasal cannula on ambulation.  Patient was also recently diagnosed atrial fibrillation and was on Eliquis.  Ever since her discharge, patient was feeling unwell and had persistent shortness of breath and had ran out of her Eliquis after 30 days.  History of hypermetabolic left upper lung nodule as well as hypermetabolic hilar and mediastinal lymph nodes, left scapular lesion, area of right acetabulum and rectum, concerning for metastatic disease on PET scan.  Patient was followed by [place] pulmonology/oncology, underwent CT-guided percutaneous biopsy of left scapular lytic bone lesion on 06/08/2022 and bronchoscopy with EBUS on 07/04/2022.  Pt then did not follow up with oncology on multiple appointments.   In the ED, on this presentation, she was afebrile and normotensive.  Initially noted to be in atrial fibrillation with RVR but this  resolved spontaneously.  She was initially was placed on 2 L, however continued have significant oxygen desaturation was ultimately placed on 8 L high flow nasal cannula.  Mild leukocytosis at 12.5.  Troponin was 30. Negative flu/COVID/RSV. CT chest was obtained which showed masslike consolidation within the left upper lobe abutting the left hilar concerning for central obstructing neoplasm.  There is peripheral areas of consolidation and interlobar septal thickening throughout the left upper lobe concerning for  combination of postobstructive changes, regional metastatic disease and lymphangitic spread of tumor.  Mediastinal and hilar adenopathy consistent with nodule metastasis. 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 EDP consulted pulmonology and patient was admitted to the hospital for further evaluation and treatment.  Assessment and Plan:   Acute on chronic respiratory failure with hypoxia  Was on 2 L of oxygen at baseline but needed up to 15 L of oxygen, currently on 4 L of oxygen by high flow nasal cannula.  Patient was discharged from the oncology office due to lack of follow-up appointments.  CT chest this time shows showed masslike consolidation within the left upper lobe abutting the left hilar concerning for central obstructing neoplasm.peripheral areas of consolidation and interlobar septal thickening throughout the left upper lobe concerning for combination of postobstructive changes, regional metastatic disease and lymphangitic spread of tumor.  Been started on IV Zosyn for possible underlying pneumonia.  Symptoms of respiratory distress most likely likely secondary to mechanical obstruction with postobstructive pneumonia..  Pulmonary was consulted and recommended consultation with oncology and radiation oncology.   -Pt transferred to [facility] where pt has since initiated radiation tx. Per Rad Onc, rec to continue first few radiation tx while she is still in-patient and if/when feeling better, would complete full 2 week course  Hypokalemia  -Normalized  Mild hypomagnesemia.   -Replaced and now normalized   Essential hypertension -Patient was hypotensive as outpatient.   -Currently on labetalol irbesartan and Cardizem.   -BP stable and controlled this afternoon   Type 2 diabetes mellitus without complications Latest hemoglobin A1c of 6.3 on 07/24/2022 -continue sliding scale insulin -Glycemic trends  largely stable  Tobacco use Ongoing  tobacco abuse. -Cessation was reportedly already done   Chronic anxiety Continue Cymbalta.   Paroxysmal atrial fibrillation with RVR  New diagnosis.  Rate controlled at this time.  Continue Cardizem, labetalol, Eliquis has been resumed.     DVT prophylaxis:  apixaban (ELIQUIS) tablet 5 mg   Code Status:     Code Status: Full Code  Disposition: Uncertain at this time.  Possible home if/when pt feels improved after first several radiation treatments  Status is: Inpatient  Remains inpatient appropriate because:  Obstructive bronchial neoplasm, shortness of breath, hypoxic respiratory failure, need for possible radiation treatment, oncology opinion.   Family Communication:  I spoke with the patient's spouse Mr. Arnesia Eyestone on on the phone and updated him about the clinical condition of the patient.  Consultants:  Pulmonary Oncology Radiation oncology  Procedures:  None at this time  Antimicrobials:  Zosyn IV  Anti-infectives (From admission, onward)    Start     Dose/Rate Route Frequency Ordered Stop   08/31/22 2200  piperacillin-tazobactam (ZOSYN) IVPB 3.375 g        3.375 g 12.5 mL/hr over 240 Minutes Intravenous Every 8 hours 08/31/22 1840     08/29/22 2315  piperacillin-tazobactam (ZOSYN) IVPB 3.375 g  Status:  Discontinued        3.375 g 12.5 mL/hr over 240 Minutes Intravenous Every 8 hours 08/29/22 2134 08/31/22 1540      Subjective: Reports feeling better since starting radiation tx today. Reports breathing better  Objective: Vitals:   09/01/22 0021 09/01/22 0514 09/01/22 1240 09/01/22 1342  BP: 138/68 132/73  122/78  Pulse: 84 89  89  Resp: 19 18  18   Temp: 98.7 F (37.1 C) 98.5 F (36.9 C)  98.4 F (36.9 C)  TempSrc: Oral Oral  Oral  SpO2: 94% 93% 91% 94%  Weight:      Height:        Intake/Output Summary (Last 24 hours) at 09/01/2022 1620 Last data filed at 09/01/2022 1100 Gross per 24 hour  Intake 410 ml  Output 1500 ml  Net -1090 ml     Filed Weights   08/30/22 2006 08/31/22 0054 08/31/22 1600  Weight: 66.3 kg 66.3 kg 65.4 kg    Physical Examination: Body mass index is 24.75 kg/m.   General exam: Conversant, in no acute distress Respiratory system: normal chest rise, clear, no audible wheezing Cardiovascular system: regular rhythm, s1-s2 Gastrointestinal system: Nondistended, nontender, pos BS Central nervous system: No seizures, no tremors Extremities: No cyanosis, no joint deformities Skin: No rashes, no pallor Psychiatry: Affect normal // no auditory hallucinations   Data Reviewed:   CBC: Recent Labs  Lab 08/29/22 1400 08/30/22 0337 08/31/22 0030 09/01/22 0545  WBC 12.5* 10.5 11.8* 10.7*  NEUTROABS 9.9*  --   --   --   HGB 12.2 10.4* 10.7* 10.8*  HCT 38.2 32.6* 32.7* 34.0*  MCV 79.4* 78.9* 76.9* 78.2*  PLT 575* 450* 409* 426*     Basic Metabolic Panel: Recent Labs  Lab 08/29/22 1400 08/30/22 0337 08/31/22 0030 09/01/22 0545  NA 136 135 135 136  K 3.8 3.2* 4.0 3.6  CL 97* 98 100 94*  CO2 23 26 26 30   GLUCOSE 146* 112* 129* 145*  BUN 10 10 6* 10  CREATININE 0.90 0.76 0.62 0.57  CALCIUM 9.1 8.6* 9.1 9.2  MG  --   --  1.6* 2.0     Liver Function Tests: Recent Labs  Lab 08/29/22 1400  AST 18  ALT 12  ALKPHOS 97  BILITOT 0.7  PROT 7.1  ALBUMIN 3.2*      Radiology Studies: NM Bone Scan Whole Body  Result Date: 09/01/2022 CLINICAL DATA:  Non-small cell lung cancer. EXAM: NUCLEAR MEDICINE WHOLE BODY BONE SCAN TECHNIQUE: Whole body anterior and posterior images were obtained approximately 3 hours after intravenous injection of radiopharmaceutical. RADIOPHARMACEUTICALS:  21.5 mCi Technetium-6m MDP IV COMPARISON:  None Available. FINDINGS: Several focal accumulation of radiotracer activity within the midthoracic and upper lumbar spine consistent with metastatic disease. Multiple posterior LEFT and RIGHT rib lesions. Abnormal accumulation within the LEFT scapula additionally. Mild  involvement of the LEFT hemipelvis. IMPRESSION: Multifocal skeletal metastasis involving the spine, ribs, LEFT scapula and minimal involvement LEFT hemipelvis. Electronically Signed   By: Suzy Bouchard M.D.   On: 09/01/2022 14:45      LOS: 3 days    Marylu Lund, MD [facility] Available via Epic secure chat 7am-7pm After these hours, please refer to coverage provider listed on [email or web address] 09/01/2022, 4:20 PM

## 2022-09-01 NOTE — Plan of Care (Signed)
  Problem: Fluid Volume: Goal: Ability to maintain a balanced intake and output will improve Outcome: Progressing   Problem: Metabolic: Goal: Ability to maintain appropriate glucose levels will improve Outcome: Progressing   Problem: Nutritional: Goal: Maintenance of adequate nutrition will improve Outcome: Progressing   Problem: Clinical Measurements: Goal: Respiratory complications will improve Outcome: Progressing

## 2022-09-01 NOTE — Progress Notes (Signed)
Dr Marin Olp requesting path results from Care Everywhere. All results provided to Dr Marin Olp however, he is requesting ALK and ROS results which don't seem to be available.   Called Joelene Millin, RN Naviagtor [phone]) and left message requesting that full NGS results be faxed to the office.   Received results. Given to Dr Marin Olp.   Oncology Nurse Navigator Documentation     09/01/2022    9:15 AM  Oncology Nurse Navigator Flowsheets  Navigator Location [facility]  Navigator Encounter Type Pathology Review  Interventions Coordination of Care  Coordination of Care Pathology  Time Spent with Patient 30

## 2022-09-02 ENCOUNTER — Inpatient Hospital Stay (HOSPITAL_COMMUNITY): Payer: Medicare Other

## 2022-09-02 DIAGNOSIS — I639 Cerebral infarction, unspecified: Secondary | ICD-10-CM

## 2022-09-02 DIAGNOSIS — F419 Anxiety disorder, unspecified: Secondary | ICD-10-CM | POA: Diagnosis not present

## 2022-09-02 DIAGNOSIS — I10 Essential (primary) hypertension: Secondary | ICD-10-CM | POA: Diagnosis not present

## 2022-09-02 DIAGNOSIS — J9621 Acute and chronic respiratory failure with hypoxia: Secondary | ICD-10-CM | POA: Diagnosis not present

## 2022-09-02 DIAGNOSIS — I480 Paroxysmal atrial fibrillation: Secondary | ICD-10-CM | POA: Diagnosis not present

## 2022-09-02 DIAGNOSIS — R918 Other nonspecific abnormal finding of lung field: Secondary | ICD-10-CM

## 2022-09-02 LAB — COMPREHENSIVE METABOLIC PANEL
ALT: 13 U/L (ref 0–44)
AST: 13 U/L — ABNORMAL LOW (ref 15–41)
Albumin: 3 g/dL — ABNORMAL LOW (ref 3.5–5.0)
Alkaline Phosphatase: 95 U/L (ref 38–126)
Anion gap: 8 (ref 5–15)
BUN: 14 mg/dL (ref 8–23)
CO2: 29 mmol/L (ref 22–32)
Calcium: 8.9 mg/dL (ref 8.9–10.3)
Chloride: 94 mmol/L — ABNORMAL LOW (ref 98–111)
Creatinine, Ser: 0.67 mg/dL (ref 0.44–1.00)
GFR, Estimated: >60 mL/min (ref >60)
Glucose, Bld: 141 mg/dL — ABNORMAL HIGH (ref 70–99)
Potassium: 3.3 mmol/L — ABNORMAL LOW (ref 3.5–5.1)
Sodium: 131 mmol/L — ABNORMAL LOW (ref 135–145)
Total Bilirubin: 1.2 mg/dL (ref 0.3–1.2)
Total Protein: 6.9 g/dL (ref 6.5–8.1)

## 2022-09-02 LAB — GLUCOSE, CAPILLARY
Glucose-Capillary: 138 mg/dL — ABNORMAL HIGH (ref 70–99)
Glucose-Capillary: 180 mg/dL — ABNORMAL HIGH (ref 70–99)
Glucose-Capillary: 107 mg/dL — ABNORMAL HIGH (ref 70–99)
Glucose-Capillary: 176 mg/dL — ABNORMAL HIGH (ref 70–99)

## 2022-09-02 LAB — CBC
HCT: 33.5 % — ABNORMAL LOW (ref 36.0–46.0)
Hemoglobin: 11 g/dL — ABNORMAL LOW (ref 12.0–15.0)
MCH: 25.3 pg — ABNORMAL LOW (ref 26.0–34.0)
MCHC: 32.8 g/dL (ref 30.0–36.0)
MCV: 77.2 fL — ABNORMAL LOW (ref 80.0–100.0)
Platelets: 465 10*3/uL — ABNORMAL HIGH (ref 150–400)
RBC: 4.34 MIL/uL (ref 3.87–5.11)
RDW: 13.7 % (ref 11.5–15.5)
WBC: 9.9 10*3/uL (ref 4.0–10.5)
nRBC: 0 % (ref 0.0–0.2)

## 2022-09-02 LAB — APTT: aPTT: 55 seconds — ABNORMAL HIGH (ref 24–36)

## 2022-09-02 LAB — CEA: CEA: 1408 ng/mL — ABNORMAL HIGH (ref 0.0–4.7)

## 2022-09-02 MED ORDER — HEPARIN (PORCINE) 25000 UT/250ML-% IV SOLN
850.0000 [IU]/h | INTRAVENOUS | Status: AC
  Administered 2022-09-02: 750 [IU]/h via INTRAVENOUS
  Filled 2022-09-02: qty 250

## 2022-09-02 MED ORDER — GADOBUTROL 1 MMOL/ML IV SOLN
6.5000 mL | Freq: Once | INTRAVENOUS | Status: AC | PRN
  Administered 2022-09-02: 6.5 mL via INTRAVENOUS

## 2022-09-02 MED ORDER — POTASSIUM CHLORIDE CRYS ER 10 MEQ PO TBCR
40.0000 meq | EXTENDED_RELEASE_TABLET | Freq: Once | ORAL | Status: AC
  Administered 2022-09-02: 40 meq via ORAL
  Filled 2022-09-02: qty 4

## 2022-09-02 MED ORDER — FLEET ENEMA 7-19 GM/118ML RE ENEM
1.0000 | ENEMA | Freq: Once | RECTAL | Status: AC
  Administered 2022-09-02: 1 via RECTAL
  Filled 2022-09-02: qty 1

## 2022-09-02 MED ORDER — ZOLEDRONIC ACID 4 MG/5ML IV CONC
4.0000 mg | Freq: Once | INTRAVENOUS | Status: AC
  Administered 2022-09-02: 4 mg via INTRAVENOUS
  Filled 2022-09-02: qty 5

## 2022-09-02 MED ORDER — IOHEXOL 350 MG/ML SOLN
75.0000 mL | Freq: Once | INTRAVENOUS | Status: AC | PRN
  Administered 2022-09-02: 75 mL via INTRAVENOUS

## 2022-09-02 MED ORDER — ENOXAPARIN SODIUM 80 MG/0.8ML IJ SOSY: 70.0000 mg | PREFILLED_SYRINGE | Freq: Two times a day (BID) | INTRAMUSCULAR | Status: DC

## 2022-09-02 MED ORDER — STROKE: EARLY STAGES OF RECOVERY BOOK
1.0000 | Freq: Once | Status: AC
  Administered 2022-09-03: 1
  Filled 2022-09-02: qty 1

## 2022-09-02 MED ORDER — SODIUM CHLORIDE 0.9 % IV SOLN: INTRAVENOUS | Status: DC

## 2022-09-02 MED ORDER — FLEET ENEMA 7-19 GM/118ML RE ENEM
1.0000 | ENEMA | Freq: Once | RECTAL | Status: AC
  Administered 2022-09-03: 1 via RECTAL
  Filled 2022-09-02: qty 1

## 2022-09-02 MED ORDER — SODIUM CHLORIDE 0.9 % IV SOLN
250.0000 mg | Freq: Every day | INTRAVENOUS | Status: AC
  Administered 2022-09-02 – 2022-09-04 (×3): 250 mg via INTRAVENOUS
  Filled 2022-09-02 (×3): qty 20

## 2022-09-02 MED ORDER — DRONABINOL 2.5 MG PO CAPS
2.5000 mg | ORAL_CAPSULE | Freq: Two times a day (BID) | ORAL | Status: DC
  Administered 2022-09-02 – 2022-09-14 (×19): 2.5 mg via ORAL
  Filled 2022-09-02 (×20): qty 1

## 2022-09-02 NOTE — Progress Notes (Signed)
ANTICOAGULATION CONSULT NOTE - Initial Consult  Pharmacy Consult for heparin Indication:  atrial fibrillation, Eliquis currently on hold  Allergies  Allergen Reactions   Bystolic [Nebivolol Hcl] Nausea Only and Other (See Comments)    Abdominal pain   Hydrochlorothiazide Nausea Only and Swelling    Leg swelling   Norvasc [Amlodipine] Swelling    Leg swelling    Patient Measurements: Height: 5\' 4"  (162.6 cm) Weight: 65.4 kg (144 lb 2.9 oz) IBW/kg (Calculated) : 54.7 Heparin Dosing Weight: 65.4 kg  Vital Signs: Temp: 97.8 F (36.6 C) (03/16 1247) Temp Source: Oral (03/16 1247) BP: 128/99 (03/16 1247) Pulse Rate: 87 (03/16 1247)  Labs: Recent Labs    08/31/22 0030 09/01/22 0545 09/02/22 0524  HGB 10.7* 10.8* 11.0*  HCT 32.7* 34.0* 33.5*  PLT 409* 426* 465*  CREATININE 0.62 0.57 0.67    Estimated Creatinine Clearance: 55.7 mL/min (by C-G formula based on SCr of 0.67 mg/dL).   Medical History: Past Medical History:  Diagnosis Date   Bronchitis    COPD (chronic obstructive pulmonary disease) ([place])    Diabetes mellitus without complication ([place])    Hypertension      Assessment: 72 year old female on Eliquis for atrial fibrillation, which has been continued while admitted - last dose 3/15 2100 then held in anticipation of port placement. 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 Abnormal findings noted on PET scan concerning for hypermetabolic activity in the rectum, GI is planning flexible sigmoidoscopy 3/17 (currently on schedule for 10 am). 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 Further stroke workup by Neurology. Pharmacy consulted for heparin management by Neurology for atrial fibrillation - neuro scale.  Goal of Therapy:  Heparin level 0.3-0.5 units/ml aPTT 66-85 seconds Monitor platelets by anticoagulation protocol: Yes   Plan:  -Start heparin infusion at 750 units/hr -Check aPTT at  2300 -Anticipate heparin level will be elevated in setting of recent Eliquis use - will follow aPTT until correlation with heparin level -Daily heparin level, CBC while on heparin -Heparin to stop at 0400 for GI procedure (on schedule for 1000) -Follow for resumption of heparin vs DOAC following procedure  Tawnya Crook, PharmD, BCPS Clinical Pharmacist 09/02/2022 4:23 PM

## 2022-09-02 NOTE — Progress Notes (Signed)
ANTICOAGULATION CONSULT NOTE - Initial Consult  Pharmacy Consult for switch from Eliquis to Lovenox Indication: atrial fibrillation  Allergies  Allergen Reactions   Bystolic [Nebivolol Hcl] Nausea Only and Other (See Comments)    Abdominal pain   Hydrochlorothiazide Nausea Only and Swelling    Leg swelling   Norvasc [Amlodipine] Swelling    Leg swelling    Patient Measurements: Height: 5\' 4"  (162.6 cm) Weight: 65.4 kg (144 lb 2.9 oz) IBW/kg (Calculated) : 54.7  Vital Signs: Temp: 98.2 F (36.8 C) (03/16 0526) Temp Source: Oral (03/16 0526) BP: 139/70 (03/16 0526) Pulse Rate: 81 (03/16 0526)  Labs: Recent Labs    08/31/22 0030 09/01/22 0545 09/02/22 0524  HGB 10.7* 10.8* 11.0*  HCT 32.7* 34.0* 33.5*  PLT 409* 426* 465*  CREATININE 0.62 0.57 0.67    Estimated Creatinine Clearance: 55.7 mL/min (by C-G formula based on SCr of 0.67 mg/dL).   Medical History: Past Medical History:  Diagnosis Date   Bronchitis    COPD (chronic obstructive pulmonary disease) ([place])    Diabetes mellitus without complication ([facility])    Hypertension     Medications:  Scheduled:   atorvastatin  40 mg Oral Daily   diltiazem  120 mg Oral Daily   dronabinol  2.5 mg Oral BID AC   DULoxetine  60 mg Oral BID   enoxaparin (LOVENOX) injection  70 mg Subcutaneous Q12H   fluticasone  2 spray Each Nare Daily   furosemide  20 mg Oral Daily   insulin aspart  0-6 Units Subcutaneous TID WC   irbesartan  150 mg Oral BID   labetalol  100 mg Oral BID   magnesium oxide  400 mg Oral BID   mometasone-formoterol  2 puff Inhalation BID   pantoprazole  40 mg Oral Daily   potassium chloride  40 mEq Oral Once   Infusions:   ferric gluconate (FERRLECIT) IVPB     piperacillin-tazobactam 3.375 g (09/02/22 0514)   zoledronic acid (ZOMETA) 4 mg in sodium chloride 0.9 % 100 mL IVPB     PRN: acetaminophen, albuterol, guaiFENesin-dextromethorphan, morphine injection, morphine injection, mouth  rinse  Assessment 72 y/o F with metastatic adenocarcinoma of lung.  On chronic Eliquis for anticoagulation in setting of atrial fibrillation. Orders received to convert Eliquis to Lovenox in anticipation of need to interrupt anticoagulation for Port-A-Cath placement. CrCl is well above 30 mL/min.   Plan:  DC Eliquis Begin Lovenox 70 mg (1 mg/kg) [place] q12h Follow-up for word on timing of interruption of anticoagulation for Port-A-Cath placement.  Clayburn Pert, PharmD, BCPS Clinical Pharmacist 09/02/2022  8:34 AM

## 2022-09-02 NOTE — Plan of Care (Signed)
  Problem: Coping: Goal: Ability to adjust to condition or change in health will improve Outcome: Progressing   Problem: Skin Integrity: Goal: Risk for impaired skin integrity will decrease Outcome: Progressing   Problem: Tissue Perfusion: Goal: Adequacy of tissue perfusion will improve Outcome: Progressing   

## 2022-09-02 NOTE — Progress Notes (Signed)
Ms. Rhonda Blackwell seems to be doing okay this morning.  She is on supplemental oxygen.  She did go down for simulation yesterday.  She may have had her first radiation treatment.  I was able to look at her prior pathology.  This is in my opinion a poorly differentiated adenocarcinoma of the lung.  She does not have any targetable mutations on her molecular analysis that was done previously.  I still find it interesting that she has this area of thickening down to the colorectum.  I really think that we need to have this area evaluated.  She is not eating much.  Hopefully, her family can bring some food in for her.  She did have a bone scan done which does show metastatic disease to her bones.  We will go ahead and give her some Zometa.  I will also have her on some Marinol to see if this may help with her appetite.  Unfortunately, we are going to have to use chemo immunotherapy on her.  There is no role for targeted therapy given that there is no targetable mutations.  I am still not sure how much she really understands as to what is going on.  I tried to get a hold of her husband this morning.  I just cannot get through.  Hopefully, I will be able to talk to her family next week.  This is going to be a very difficult problem to treat.  I do not think she has the best performance status.  Thankfully, her prealbumin is not that bad at 16.  She is iron deficient.  We will have to give her some IV iron.  Again, I really think she needs to have some GI evaluation to see what might be going on done in the colorectum.  She is on Eliquis.  This may need to be switched over to heparin for right now.  She is going need to have a Port-A-Cath placed to prevent do any kind of chemotherapy.  I know this is incredibly complex.  I do appreciate everybody's help with her.  Rhonda Haw, MD  Ex[place] 14:14

## 2022-09-02 NOTE — Consult Note (Signed)
Referring Provider:  [place] Primary Care Physician:  [name], MD Primary Gastroenterologist: [facility]  Reason for Consultation: Abnormal PET scan, evaluate for colonoscopy  HPI: Rhonda Blackwell is a 72 y.o. female with past medical history of recently diagnosed likely metastatic lung cancer, shortness of breath transferred to [facility] to initiate treatment with oncology including chemo and radiation. PET scan on November 2023 showed hypermetabolic activity in the rectum without any discrete CT correlation possibly reflecting physiological activity.  GI is consulted for further evaluation.  Patient seen and examined at bedside.  Limited history obtained from the patient.  Patient denies any GI symptoms.  Denies seeing any blood in the stool or black stool.  Denies any diarrhea or constipation.  Last colonoscopy in 2018 was normal according to outside GI physician notes. Past Medical History:  Diagnosis Date   Bronchitis    COPD (chronic obstructive pulmonary disease) ([place])    Diabetes mellitus without complication ([place])    Hypertension     Past Surgical History:  Procedure Laterality Date   CESAREAN SECTION     CHOLECYSTECTOMY     FOOT SURGERY     HERNIA REPAIR      Prior to Admission medications   Medication Sig Start Date End Date Taking? Authorizing Provider  acetaminophen (TYLENOL) 650 MG CR tablet Take 650 mg by mouth every 8 (eight) hours as needed for pain.   Yes [provider]  albuterol (PROVENTIL HFA;VENTOLIN HFA) 108 (90 BASE) MCG/ACT inhaler Inhale 2 puffs into the lungs every 6 (six) hours as needed for shortness of breath (cough). 05/28/15  Yes Mesner, Corene Cornea, MD  apixaban (ELIQUIS) 5 MG TABS tablet Take 1 tablet (5 mg total) by mouth 2 (two) times daily. 07/28/22 08/30/22 Yes Donne Hazel, MD  atorvastatin (LIPITOR) 40 MG tablet Take 40 mg by mouth daily.   Yes [provider]  calcium carbonate (OSCAL) 1500 (600 Ca) MG TABS tablet  Take 600 mg of elemental calcium by mouth daily.   Yes [provider]  diltiazem (CARDIZEM CD) 120 MG 24 hr capsule Take 1 capsule (120 mg total) by mouth daily. 07/29/22 10/28/22 Yes Donne Hazel, MD  DULoxetine (CYMBALTA) 60 MG capsule Take 60 mg by mouth 2 (two) times daily. 06/05/22  Yes [provider]  fluticasone (FLONASE) 50 MCG/ACT nasal spray Place 2 sprays into both nostrils daily.   Yes [provider]  fluticasone-salmeterol (ADVAIR) 250-50 MCG/ACT AEPB Inhale 1 puff into the lungs 2 (two) times daily as needed (wheezing, shortness of breath.). 11/24/21  Yes [provider]  furosemide (LASIX) 20 MG tablet Take 20 mg by mouth daily. 07/18/22  Yes [provider]  irbesartan (AVAPRO) 150 MG tablet Take 150 mg by mouth 2 (two) times daily.   Yes [provider]  labetalol (NORMODYNE) 100 MG tablet Take 100 mg by mouth 2 (two) times daily.   Yes [provider]  pantoprazole (PROTONIX) 40 MG tablet Take 40 mg by mouth daily.   Yes [provider]  sitaGLIPtan-metformin (JANUMET) 50-500 MG per tablet Take 1 tablet by mouth 2 (two) times daily with a meal.   Yes [provider]  azithromycin (ZITHROMAX) 250 MG tablet Take 1 tablet (250 mg total) by mouth daily for 2 more days Patient not taking: Reported on 08/30/2022 07/28/22   Donne Hazel, MD    Scheduled Meds:  atorvastatin  40 mg Oral Daily   diltiazem  120 mg Oral Daily  dronabinol  2.5 mg Oral BID AC   DULoxetine  60 mg Oral BID   enoxaparin (LOVENOX) injection  70 mg Subcutaneous Q12H   fluticasone  2 spray Each Nare Daily   furosemide  20 mg Oral Daily   insulin aspart  0-6 Units Subcutaneous TID WC   irbesartan  150 mg Oral BID   labetalol  100 mg Oral BID   magnesium oxide  400 mg Oral BID   mometasone-formoterol  2 puff Inhalation BID   pantoprazole  40 mg Oral Daily   potassium chloride  40 mEq Oral Once   Continuous Infusions:  ferric  gluconate (FERRLECIT) IVPB     piperacillin-tazobactam 3.375 g (09/02/22 0514)   zoledronic acid (ZOMETA) 4 mg in sodium chloride 0.9 % 100 mL IVPB     PRN Meds:.acetaminophen, albuterol, guaiFENesin-dextromethorphan, morphine injection, morphine injection, mouth rinse  Allergies as of 08/29/2022 - Review Complete 08/29/2022  Allergen Reaction Noted   Hydrochlorothiazide Nausea Only and Swelling 07/23/2022   Amlodipine Swelling 08/16/2015   Nebivolol Nausea Only 08/16/2015    Family History  Problem Relation Age of Onset   Diabetes Mellitus II Sister     Social History   Socioeconomic History   Marital status: Married    Spouse name: Not on file   Number of children: Not on file   Years of education: Not on file   Highest education level: Not on file  Occupational History   Not on file  Tobacco Use   Smoking status: Some Days    Packs/day: 1    Types: Cigarettes   Smokeless tobacco: Never  Substance and Sexual Activity   Alcohol use: No   Drug use: No   Sexual activity: Not on file  Other Topics Concern   Not on file  Social History Narrative   Not on file   Social Determinants of Health   Financial Resource Strain: Not on file  Food Insecurity: No Food Insecurity (08/30/2022)   Hunger Vital Sign    Worried About Running Out of Food in the Last Year: Never true    Ran Out of Food in the Last Year: Never true  Transportation Needs: No Transportation Needs (08/30/2022)   PRAPARE - Hydrologist (Medical): No    Lack of Transportation (Non-Medical): No  Physical Activity: Not on file  Stress: Not on file  Social Connections: Not on file  Intimate Partner Violence: Not At Risk (08/30/2022)   Humiliation, Afraid, Rape, and Kick questionnaire    Fear of Current or Ex-Partner: No    Emotionally Abused: No    Physically Abused: No    Sexually Abused: No    Review of Systems: All negative except as stated above in HPI.  Physical  Exam: Vital signs: Vitals:   09/02/22 0526 09/02/22 0821  BP: 139/70   Pulse: 81   Resp: (!) 24   Temp: 98.2 F (36.8 C)   SpO2: 95% 93%   Last BM Date : 08/30/22 General:   Alert,  Well-developed, well-nourished, pleasant and cooperative in NAD Lungs: No visible respiratory distress Heart:  Regular rate and rhythm; no murmurs, clicks, rubs,  or gallops. Abdomen: Soft, nontender, nondistended, bowel sounds present, no peritoneal signs Alert and oriented x 3 Not in acute distress Rectal:  Deferred  GI:  Lab Results: Recent Labs    08/31/22 0030 09/01/22 0545 09/02/22 0524  WBC 11.8* 10.7* 9.9  HGB 10.7* 10.8* 11.0*  HCT 32.7* 34.0*  33.5*  PLT 409* 426* 465*   BMET Recent Labs    08/31/22 0030 09/01/22 0545 09/02/22 0524  NA 135 136 131*  K 4.0 3.6 3.3*  CL 100 94* 94*  CO2 26 30 29   GLUCOSE 129* 145* 141*  BUN 6* 10 14  CREATININE 0.62 0.57 0.67  CALCIUM 9.1 9.2 8.9   LFT Recent Labs    09/02/22 0524  PROT 6.9  ALBUMIN 3.0*  AST 13*  ALT 13  ALKPHOS 95  BILITOT 1.2   PT/INR No results for input(s): "LABPROT", "INR" in the last 72 hours.   Studies/Results: NM Bone Scan Whole Body  Result Date: 09/01/2022 CLINICAL DATA:  Non-small cell lung cancer. EXAM: NUCLEAR MEDICINE WHOLE BODY BONE SCAN TECHNIQUE: Whole body anterior and posterior images were obtained approximately 3 hours after intravenous injection of radiopharmaceutical. RADIOPHARMACEUTICALS:  21.5 mCi Technetium-80m MDP IV COMPARISON:  None Available. FINDINGS: Several focal accumulation of radiotracer activity within the midthoracic and upper lumbar spine consistent with metastatic disease. Multiple posterior LEFT and RIGHT rib lesions. Abnormal accumulation within the LEFT scapula additionally. Mild involvement of the LEFT hemipelvis. IMPRESSION: Multifocal skeletal metastasis involving the spine, ribs, LEFT scapula and minimal involvement LEFT hemipelvis. Electronically Signed   By: Suzy Bouchard M.D.   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 - atrial fibrillation.  Eliquis on hold. -Respiratory failure.  Improving.  Recommendations ----------------------- -Recommend to do flexible sigmoidoscopy tomorrow to evaluate abnormal finding on the PET scan concerning for hypermetabolic activity in the rectum. -Discussed with Dr. Grandville Silos.  Hold anticoagulation for today. -ok  to have full liquid diet today.  Keep n.p.o. past midnight.  1 Fleet enema today and 1 Fleet enema tomorrow morning.  Risks (bleeding, infection, bowel perforation that could require surgery, sedation-related changes in cardiopulmonary systems), benefits (identification and possible treatment of source of symptoms, exclusion of certain causes of symptoms), and alternatives (watchful waiting, radiographic imaging studies, empiric medical treatment)  were explained to patient/family in detail and patient wishes to proceed.     LOS: 4 days   Otis Brace  MD, FACP 09/02/2022, 9:12 AM  Contact #  [phone]

## 2022-09-02 NOTE — Progress Notes (Signed)
ANTICOAGULATION CONSULT NOTE - Follow-up Consult  Pharmacy Consult for heparin Indication:  atrial fibrillation, Eliquis currently on hold  Allergies  Allergen Reactions   Bystolic [Nebivolol Hcl] Nausea Only and Other (See Comments)    Abdominal pain   Hydrochlorothiazide Nausea Only and Swelling    Leg swelling   Norvasc [Amlodipine] Swelling    Leg swelling    Patient Measurements: Height: 5\' 4"  (162.6 cm) Weight: 65.4 kg (144 lb 2.9 oz) IBW/kg (Calculated) : 54.7 Heparin Dosing Weight: 65.4 kg  Vital Signs: Temp: 99.4 F (37.4 C) (03/16 2257) Temp Source: Oral (03/16 2257) BP: 155/80 (03/16 2257) Pulse Rate: 98 (03/16 2257)  Labs: Recent Labs    08/31/22 0030 09/01/22 0545 09/02/22 0524 09/02/22 2248  HGB 10.7* 10.8* 11.0*  --   HCT 32.7* 34.0* 33.5*  --   PLT 409* 426* 465*  --   APTT  --   --   --  55*  CREATININE 0.62 0.57 0.67  --      Estimated Creatinine Clearance: 55.7 mL/min (by C-G formula based on SCr of 0.67 mg/dL).   Medical History: Past Medical History:  Diagnosis Date   Bronchitis    COPD (chronic obstructive pulmonary disease) ([place])    Diabetes mellitus without complication ([place])    Hypertension      Assessment: 72 year old female on Eliquis for atrial fibrillation, which has been continued while admitted - last dose 3/15 2100 then held in anticipation of port placement. 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 Abnormal findings noted on PET scan concerning for hypermetabolic activity in the rectum, GI is planning flexible sigmoidoscopy 3/17 (currently on schedule for 10 am). 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 Further stroke workup by Neurology. Pharmacy consulted for heparin management by Neurology for atrial fibrillation - neuro scale.  09/02/22 aPTT = 55 sec (subtherapeutic) with heparin gtt @ 750 units/hr No complications of therapy noted  Goal of Therapy:  Heparin  level 0.3-0.5 units/ml aPTT 66-85 seconds Monitor platelets by anticoagulation protocol: Yes   Plan:  -Increase heparin infusion to 850 units/hr -Anticipate heparin level will be elevated in setting of recent Eliquis use - will follow aPTT until correlation with heparin level -Daily heparin level, CBC while on heparin -Heparin to stop at 0400 for GI procedure (on schedule for 1000) -Follow for resumption of heparin vs DOAC following procedure  Everette Rank, PharmD 09/02/2022 11:55 PM

## 2022-09-02 NOTE — Progress Notes (Addendum)
PROGRESS NOTE    Rhonda Blackwell  [id number] DOB: [birth year] DOA: 08/29/2022 PCP: [name], MD    Chief Complaint  Patient presents with   Tachycardia    Brief Narrative:  Ledonna Boney is a 72 y.o. female with medical history significant of COPD, Type 2 diabetes, CVA, hypertension atrial fibrillation on Eliquis, metastatic disease with unknown primary presented to the hospital with increasing shortness of breath.  Of note, patient was recently admitted between  07/23/22 to 07/27/22 with sepsis secondary to community-acquired multifocal pneumonia.  She was discharged on 2 L nasal cannula at rest and up to 3 L nasal cannula on ambulation.  Patient was also recently diagnosed atrial fibrillation and was on Eliquis.  Ever since her discharge, patient was feeling unwell and had persistent shortness of breath and had ran out of her Eliquis after 30 days.  History of hypermetabolic left upper lung nodule as well as hypermetabolic hilar and mediastinal lymph nodes, left scapular lesion, area of right acetabulum and rectum, concerning for metastatic disease on PET scan.  Patient was followed by [facility] pulmonology/oncology, underwent CT-guided percutaneous biopsy of left scapular lytic bone lesion on 06/08/2022 and bronchoscopy with EBUS on 07/04/2022.  Pt then did not follow up with oncology on multiple appointments.    In the ED, on this presentation, she was afebrile and normotensive.  Initially noted to be in atrial fibrillation with RVR but this  resolved spontaneously.  She was initially was placed on 2 L, however continued have significant oxygen desaturation was ultimately placed on 8 L high flow nasal cannula.  Mild leukocytosis at 12.5.  Troponin was 30. Negative flu/COVID/RSV. CT chest was obtained which showed masslike consolidation within the left upper lobe abutting the left hilar concerning for central obstructing neoplasm.  There is peripheral areas of consolidation and interlobar  septal thickening throughout the left upper lobe concerning for combination of postobstructive changes, regional metastatic disease and lymphangitic spread of tumor.  Mediastinal and hilar adenopathy consistent with nodule metastasis. 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 EDP consulted pulmonology and patient was admitted to the hospital for further evaluation and treatment.   Assessment & Plan:   Principal Problem:   Acute on chronic respiratory failure with hypoxia (HCC) Active Problems:   Type 2 diabetes mellitus without complications (HCC)   Essential hypertension   Paroxysmal atrial fibrillation with RVR (HCC)   History of CVA (cerebrovascular accident)   Chronic anxiety   Tobacco use   Malignant neoplasm of hilus of left lung (HCC)   Lung mass   Acute CVA (cerebrovascular accident) ([place])  #1 acute on chronic respiratory failure with hypoxia/lung mass -Patient noted to have been on 2 L of O2 at baseline by needed up to 15 L of O2 initially on presentation, O2 requirements improving currently down to 4 L nasal cannula with sats of 93 to 95%. -Patient initially noted to have been discharged from oncology office due to lack of follow-up for scheduled appointments. -CT chest during this hospitalization showed masslike consolidation within the left upper lobe abutting the left hilar concerning for central obstructing neoplasm.  Peripheral areas of consolidation interlobar septal thickening throughout the left upper lobe concerning for combination of postobstructive changes, regional metastatic disease and lymphangitic spread of tumor. -Patient currently on IV Zosyn due to concern for postobstructive pneumonia. -Patient's presenting symptoms of respiratory distress likely secondary to mechanical obstruction with postobstructive pneumonia. -Patient seen by PCCM and recommended  consultation with oncology and radiation oncology. -Patient  subsequently transferred from [facility] to [facility] long [facility]. -Oncology is currently following and concern for adenocarcinoma of the lung, oncology also concern about findings of PET scan with hypermetabolic activity in the rectum and recommending GI evaluation.. -Patient seen by radiation oncology and underwent simulation and first treatment 09/01/2022. -Radiation oncology recommended to continue first few treatments of radiation while in-house and if and when patient better and stable could complete a 2-week course in the outpatient setting. -Appreciate oncology and radiation oncology input and recommendations.  2.  Hypokalemia -Potassium at 3.3 this morning. -Replete.  3.  Small acute infarct left frontal white matter/prior history of CVA -MRI done 09/02/2022 for staging with small acute infarct in the left frontal white matter.  Chronic small vessel ischemia with multiple chronic lacunar infarcts.  Multiple calvarial metastases, largest measuring 2 cm in the right parietal bone with transcortical growth.  Negative for brain metastases. -Patient noted out to have been on Eliquis throughout the hospitalization currently on hold in anticipation of possible flexible sigmoidoscopy to be done 09/03/2022. -Will undergo stroke workup, including 2D echo, carotid Dopplers, may need CT angiogram head and neck however will defer until patient is seen in consultation by neurology. -RN to do bedside swallow evaluation. -SLP, PT, OT. -Check a fasting lipid panel -Hemoglobin A1c noted at 6.3 on 07/24/2022. -Continue statin. -Consult with neurology for further evaluation and management. -Case discussed with neurology who are recommending heparin at this time in anticipation of procedure tomorrow and does not feel patient needs a 2D echo.  MRA head to be ordered per neurology.  4.  Hypomagnesemia -Repleted. -Magnesium at 2.0 from 1.6. -Continue oral magnesium supplementation. -Repeat labs in the AM.  5.   Hypertension -BP currently controlled on Cardizem, Lasix, Avapro, labetalol.  6.  Tobacco use -Tobacco cessation already done provide primary MD.  7.  Type 2 diabetes mellitus without complications -Hemoglobin A1c noted at 6.3 on 07/24/2022. -CBG 138 this morning. -SSI.  8.  Paroxysmal atrial fibrillation with RVR -Patient currently on Cardizem, labetalol for rate control. -Was on Eliquis which has been resumed however currently held today in anticipation of possible flexible sigmoidoscopy to be done tomorrow per GI.  9.  Chronic anxiety -Cymbalta.  10.  Abnormal PET scan -Patient noted to have an abnormal PET scan November 2023 that showed hypermetabolic activity in the rectum without any discrete CT correlation possibly reflecting physiologic activity. -Oncology recommending GI evaluation. -Patient seen in consultation by GI 09/02/2022 who are recommending a flexible sigmoidoscopy to be done tomorrow to evaluate for abnormal finding noted on PET scan concerning for hypermetabolic activity in the rectum. -Per GI.   DVT prophylaxis: Lovenox on hold in anticipation of possible flexible sigmoidoscopy tomorrow. Code Status: Full Family Communication: Updated patient.  No family at bedside. Disposition: TBD  Status is: Inpatient Remains inpatient appropriate because: Severity of illness   Consultants:  Hematology/oncology: Dr. Marin Olp 08/31/2022 Gastroenterology: Dr. Alessandra Bevels 09/02/2022 Radiation oncology: Dr. Tammi Klippel 08/31/2022 PCCM: Dr.Olalere 08/29/2022  Procedures:  CT chest 08/29/2022 Chest x-ray 08/29/2022 MRI brain 09/02/2022 Bone scan 09/01/2022  Antimicrobials:  Anti-infectives (From admission, onward)    Start     Dose/Rate Route Frequency Ordered Stop   08/31/22 2200  piperacillin-tazobactam (ZOSYN) IVPB 3.375 g        3.375 g 12.5 mL/hr over 240 Minutes Intravenous Every 8 hours 08/31/22 1840     08/29/22 2315  piperacillin-tazobactam (ZOSYN) IVPB 3.375 g  Status:  Discontinued        3.375 g 12.5 mL/hr over 240 Minutes Intravenous Every 8 hours 08/29/22 2134 08/31/22 1540         Subjective: Laying in bed eating grits.  Feels some improvement with shortness of breath.  No chest pain.  No abdominal pain.  Seems to have tolerated radiation treatment yesterday.  Objective: Vitals:   09/01/22 2011 09/02/22 0526 09/02/22 0821 09/02/22 1247  BP: (!) 156/78 139/70  (!) 128/99  Pulse: 83 81  87  Resp: 18 (!) 24  19  Temp: 98.9 F (37.2 C) 98.2 F (36.8 C)  97.8 F (36.6 C)  TempSrc: Oral Oral  Oral  SpO2: 98% 95% 93% 95%  Weight:  65.4 kg    Height:        Intake/Output Summary (Last 24 hours) at 09/02/2022 1532 Last data filed at 09/02/2022 1100 Gross per 24 hour  Intake 229.84 ml  Output 1275 ml  Net -1045.16 ml   Filed Weights   08/31/22 0054 08/31/22 1600 09/02/22 0526  Weight: 66.3 kg 65.4 kg 65.4 kg    Examination:  General exam: Appears calm and comfortable  Respiratory system: Some scattered coarse breath sounds.  Minimal expiratory wheezing.  No crackles.  Fair air movement.  Speaking in full sentences.   Cardiovascular system: S1 & S2 heard, RRR. No JVD, murmurs, rubs, gallops or clicks. No pedal edema. Gastrointestinal system: Abdomen is nondistended, soft and nontender. No organomegaly or masses felt. Normal bowel sounds heard. Central nervous system: Alert and oriented. No focal neurological deficits. Extremities: Symmetric 5 x 5 power. Skin: No rashes, lesions or ulcers Psychiatry: Judgement and insight appear normal. Mood & affect appropriate.     Data Reviewed: I have personally reviewed following labs and imaging studies  CBC: Recent Labs  Lab 08/29/22 1400 08/30/22 0337 08/31/22 0030 09/01/22 0545 09/02/22 0524  WBC 12.5* 10.5 11.8* 10.7* 9.9  NEUTROABS 9.9*  --   --   --   --   HGB 12.2 10.4* 10.7* 10.8* 11.0*  HCT 38.2 32.6* 32.7* 34.0* 33.5*  MCV 79.4* 78.9* 76.9* 78.2* 77.2*  PLT 575* 450* 409*  426* 465*    Basic Metabolic Panel: Recent Labs  Lab 08/29/22 1400 08/30/22 0337 08/31/22 0030 09/01/22 0545 09/02/22 0524  NA 136 135 135 136 131*  K 3.8 3.2* 4.0 3.6 3.3*  CL 97* 98 100 94* 94*  CO2 23 26 26 30 29   GLUCOSE 146* 112* 129* 145* 141*  BUN 10 10 6* 10 14  CREATININE 0.90 0.76 0.62 0.57 0.67  CALCIUM 9.1 8.6* 9.1 9.2 8.9  MG  --   --  1.6* 2.0  --     GFR: Estimated Creatinine Clearance: 55.7 mL/min (by C-G formula based on SCr of 0.67 mg/dL).  Liver Function Tests: Recent Labs  Lab 08/29/22 1400 09/02/22 0524  AST 18 13*  ALT 12 13  ALKPHOS 97 95  BILITOT 0.7 1.2  PROT 7.1 6.9  ALBUMIN 3.2* 3.0*    CBG: Recent Labs  Lab 09/01/22 1126 09/01/22 1641 09/01/22 2341 09/02/22 0748 09/02/22 1156  GLUCAP 208* 159* 127* 138* 180*     Recent Results (from the past 240 hour(s))  Resp panel by RT-PCR (RSV, Flu A&B, Covid) Anterior Nasal Swab     Status: None   Collection Time: 08/29/22  2:07 PM   Specimen: Anterior Nasal Swab  Result Value Ref Range Status   SARS Coronavirus 2 by RT PCR NEGATIVE NEGATIVE  Final   Influenza A by PCR NEGATIVE NEGATIVE Final   Influenza B by PCR NEGATIVE NEGATIVE Final    Comment: (NOTE) The Xpert Xpress SARS-CoV-2/FLU/RSV plus assay is intended as an aid in the diagnosis of influenza from Nasopharyngeal swab specimens and should not be used as a sole basis for treatment. Nasal washings and aspirates are unacceptable for Xpert Xpress SARS-CoV-2/FLU/RSV testing.  Fact Sheet for Patients: EntrepreneurPulse.com.au  Fact Sheet for Healthcare Providers: IncredibleEmployment.be  This test is not yet approved or cleared by the [place] FDA and has been authorized for detection and/or diagnosis of SARS-CoV-2 by FDA under an Emergency Use Authorization (EUA). This EUA will remain in effect (meaning this test can be used) for the duration of the COVID-19 declaration under Section  564(b)(1) of the Act, 21 U.S.C. section 360bbb-3(b)(1), unless the authorization is terminated or revoked.     Resp Syncytial Virus by PCR NEGATIVE NEGATIVE Final    Comment: (NOTE) Fact Sheet for Patients: EntrepreneurPulse.com.au  Fact Sheet for Healthcare Providers: IncredibleEmployment.be  This test is not yet approved or cleared by the [place] FDA and has been authorized for detection and/or diagnosis of SARS-CoV-2 by FDA under an Emergency Use Authorization (EUA). This EUA will remain in effect (meaning this test can be used) for the duration of the COVID-19 declaration under Section 564(b)(1) of the Act, 21 U.S.C. section 360bbb-3(b)(1), unless the authorization is terminated or revoked.  Performed at [facility], [place] [address], [place], [place] [address]   Expectorated Sputum Assessment w Gram Stain, Rflx to Resp Cult     Status: None (Preliminary result)   Collection Time: 08/29/22  9:22 PM   Specimen: Expectorated Sputum  Result Value Ref Range Status   Specimen Description EXPECTORATED SPUTUM  Final   Special Requests NONE  Final   Sputum evaluation   Final    THIS SPECIMEN IS ACCEPTABLE FOR SPUTUM CULTURE Performed at [facility], [place] [address], [place], [place] [address]    Report Status PENDING  Incomplete  Culture, Respiratory w Gram Stain     Status: None (Preliminary result)   Collection Time: 08/29/22  9:22 PM  Result Value Ref Range Status   Specimen Description EXPECTORATED SPUTUM  Final   Special Requests NONE Reflexed from [id number]  Final   Gram Stain   Final    RARE WBC PRESENT, PREDOMINANTLY MONONUCLEAR NO ORGANISMS SEEN    Culture   Final    RARE Consistent with normal respiratory flora. Performed at [facility], [place] [address], [place], [place] [address]    Report Status PENDING  Incomplete         Radiology Studies: MR BRAIN W WO CONTRAST  Result Date: 09/02/2022 CLINICAL  DATA:  Non-small cell lung cancer staging EXAM: MRI HEAD WITHOUT AND WITH CONTRAST TECHNIQUE: Multiplanar, multiecho pulse sequences of the brain and surrounding structures were obtained without and with intravenous contrast. CONTRAST:  6.28mL GADAVIST GADOBUTROL 1 MMOL/ML IV SOLN COMPARISON:  None Available. FINDINGS: Brain: Focus of restricted diffusion in the high left frontal white matter is nonenhancing and elongated, consistent with lacunar infarct. Multiple lacunar infarcts are seen in the deep cerebral white matter with adjacent chronic ischemic gliosis. Chronic left basal ganglia perforator infarct. Generalized cerebral volume loss. No enhancing metastatic disease to the brain. Vascular: Major flow voids and vascular enhancements are preserved Skull and upper cervical spine: Multiple bone lesions, most notably a 2 cm deposit in the posterior right parietal  bone extending through both inner and outer table. 15 mm lesion at the left paramedian vertex reaches the inner table as do some smaller right parietal bone metastases. Sinuses/Orbits: Negative IMPRESSION: 1. Small acute infarct in the left frontal white matter. Chronic small vessel ischemia with multiple chronic lacunar infarcts. 2. 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 3. Negative for brain metastasis. Electronically Signed   By: Jorje Guild M.D.   On: 09/02/2022 12:38   NM Bone Scan Whole Body  Result Date: 09/01/2022 CLINICAL DATA:  Non-small cell lung cancer. EXAM: NUCLEAR MEDICINE WHOLE BODY BONE SCAN TECHNIQUE: Whole body anterior and posterior images were obtained approximately 3 hours after intravenous injection of radiopharmaceutical. RADIOPHARMACEUTICALS:  21.5 mCi Technetium-19m MDP IV COMPARISON:  None Available. FINDINGS: Several focal accumulation of radiotracer activity within the midthoracic and upper lumbar spine consistent with metastatic disease. Multiple  posterior LEFT and RIGHT rib lesions. Abnormal accumulation within the LEFT scapula additionally. Mild involvement of the LEFT hemipelvis. IMPRESSION: Multifocal skeletal metastasis involving the spine, ribs, LEFT scapula and minimal involvement LEFT hemipelvis. Electronically Signed   By: Suzy Bouchard M.D.   On: 09/01/2022 14:45        Scheduled Meds:  atorvastatin  40 mg Oral Daily   diltiazem  120 mg Oral Daily   dronabinol  2.5 mg Oral BID AC   DULoxetine  60 mg Oral BID   fluticasone  2 spray Each Nare Daily   furosemide  20 mg Oral Daily   insulin aspart  0-6 Units Subcutaneous TID WC   irbesartan  150 mg Oral BID   labetalol  100 mg Oral BID   magnesium oxide  400 mg Oral BID   mometasone-formoterol  2 puff Inhalation BID   pantoprazole  40 mg Oral Daily   [START ON 09/03/2022] sodium phosphate  1 enema Rectal Once   Continuous Infusions:  ferric gluconate (FERRLECIT) IVPB 250 mg (09/02/22 1149)   piperacillin-tazobactam 3.375 g (09/02/22 1439)     LOS: 4 days    Time spent: 40 minutes    Irine Seal, MD [facility]   To contact the attending provider between 7A-7P or the covering provider during after hours 7P-7A, please log into the web site [email or web address] and access using universal [place] password for that web site. If you do not have the password, please call the hospital operator.  09/02/2022, 3:32 PM

## 2022-09-03 ENCOUNTER — Encounter (HOSPITAL_COMMUNITY)
Admission: EM | Disposition: A | Discharge status: Skilled Nursing Facility | Payer: Self-pay | Source: Home / Self Care | Attending: Internal Medicine

## 2022-09-03 ENCOUNTER — Inpatient Hospital Stay (HOSPITAL_COMMUNITY): Payer: Medicare Other

## 2022-09-03 ENCOUNTER — Encounter (HOSPITAL_COMMUNITY): Payer: Self-pay | Admitting: Certified Registered Nurse Anesthetist

## 2022-09-03 DIAGNOSIS — I480 Paroxysmal atrial fibrillation: Secondary | ICD-10-CM | POA: Diagnosis not present

## 2022-09-03 DIAGNOSIS — J9621 Acute and chronic respiratory failure with hypoxia: Secondary | ICD-10-CM | POA: Diagnosis not present

## 2022-09-03 DIAGNOSIS — J90 Pleural effusion, not elsewhere classified: Secondary | ICD-10-CM | POA: Diagnosis not present

## 2022-09-03 LAB — GLUCOSE, CAPILLARY
Glucose-Capillary: 168 mg/dL — ABNORMAL HIGH (ref 70–99)
Glucose-Capillary: 180 mg/dL — ABNORMAL HIGH (ref 70–99)
Glucose-Capillary: 155 mg/dL — ABNORMAL HIGH (ref 70–99)
Glucose-Capillary: 284 mg/dL — ABNORMAL HIGH (ref 70–99)
Glucose-Capillary: 115 mg/dL — ABNORMAL HIGH (ref 70–99)
Glucose-Capillary: 176 mg/dL — ABNORMAL HIGH (ref 70–99)

## 2022-09-03 LAB — CBC WITH DIFFERENTIAL/PLATELET
Abs Immature Granulocytes: 0.05 10*3/uL (ref 0.00–0.07)
Basophils Absolute: 0 10*3/uL (ref 0.0–0.1)
Basophils Relative: 0 %
Eosinophils Absolute: 0 10*3/uL (ref 0.0–0.5)
Eosinophils Relative: 0 %
HCT: 35.3 % — ABNORMAL LOW (ref 36.0–46.0)
Hemoglobin: 11.1 g/dL — ABNORMAL LOW (ref 12.0–15.0)
Immature Granulocytes: 1 %
Lymphocytes Relative: 8 %
Lymphs Abs: 0.9 10*3/uL (ref 0.7–4.0)
MCH: 24.4 pg — ABNORMAL LOW (ref 26.0–34.0)
MCHC: 31.4 g/dL (ref 30.0–36.0)
MCV: 77.6 fL — ABNORMAL LOW (ref 80.0–100.0)
Monocytes Absolute: 0.5 10*3/uL (ref 0.1–1.0)
Monocytes Relative: 5 %
Neutro Abs: 9.4 10*3/uL — ABNORMAL HIGH (ref 1.7–7.7)
Neutrophils Relative %: 86 %
Platelets: 456 10*3/uL — ABNORMAL HIGH (ref 150–400)
RBC: 4.55 MIL/uL (ref 3.87–5.11)
RDW: 13.8 % (ref 11.5–15.5)
WBC: 11 10*3/uL — ABNORMAL HIGH (ref 4.0–10.5)
nRBC: 0 % (ref 0.0–0.2)

## 2022-09-03 LAB — APTT
aPTT: 131 seconds — ABNORMAL HIGH (ref 24–36)
aPTT: 105 seconds — ABNORMAL HIGH (ref 24–36)

## 2022-09-03 LAB — LACTATE DEHYDROGENASE, PLEURAL OR PERITONEAL FLUID: LD, Fluid: 1363 U/L — ABNORMAL HIGH (ref 3–23)

## 2022-09-03 LAB — BASIC METABOLIC PANEL
Anion gap: 12 (ref 5–15)
BUN: 13 mg/dL (ref 8–23)
CO2: 26 mmol/L (ref 22–32)
Calcium: 8.8 mg/dL — ABNORMAL LOW (ref 8.9–10.3)
Chloride: 94 mmol/L — ABNORMAL LOW (ref 98–111)
Creatinine, Ser: 0.79 mg/dL (ref 0.44–1.00)
GFR, Estimated: >60 mL/min (ref >60)
Glucose, Bld: 177 mg/dL — ABNORMAL HIGH (ref 70–99)
Potassium: 3.6 mmol/L (ref 3.5–5.1)
Sodium: 132 mmol/L — ABNORMAL LOW (ref 135–145)

## 2022-09-03 LAB — LIPID PANEL
Cholesterol: 114 mg/dL (ref 0–200)
HDL: 42 mg/dL (ref >40)
LDL Cholesterol: 51 mg/dL (ref 0–99)
Total CHOL/HDL Ratio: 2.7 RATIO
Triglycerides: 106 mg/dL (ref <150)
VLDL: 21 mg/dL (ref 0–40)

## 2022-09-03 LAB — URINALYSIS, W/ REFLEX TO CULTURE (INFECTION SUSPECTED)
Bacteria, UA: NONE SEEN
Bilirubin Urine: NEGATIVE
Glucose, UA: NEGATIVE mg/dL
Hgb urine dipstick: NEGATIVE
Ketones, ur: NEGATIVE mg/dL
Leukocytes,Ua: NEGATIVE
Nitrite: NEGATIVE
Protein, ur: NEGATIVE mg/dL
Specific Gravity, Urine: 1.021 (ref 1.005–1.030)
pH: 6 (ref 5.0–8.0)

## 2022-09-03 LAB — BODY FLUID CELL COUNT WITH DIFFERENTIAL
Eos, Fluid: 0 %
Lymphs, Fluid: 7 %
Monocyte-Macrophage-Serous Fluid: 83 % (ref 50–90)
Neutrophil Count, Fluid: 10 % (ref 0–25)
Total Nucleated Cell Count, Fluid: 3668 cu mm — ABNORMAL HIGH (ref 0–1000)

## 2022-09-03 LAB — HEPARIN LEVEL (UNFRACTIONATED): Heparin Unfractionated: >1.1 IU/mL — ABNORMAL HIGH (ref 0.30–0.70)

## 2022-09-03 LAB — LACTIC ACID, PLASMA: Lactic Acid, Venous: 1.3 mmol/L (ref 0.5–1.9)

## 2022-09-03 LAB — PROTEIN, PLEURAL OR PERITONEAL FLUID: Total protein, fluid: 3.8 g/dL

## 2022-09-03 LAB — MRSA NEXT GEN BY PCR, NASAL: MRSA by PCR Next Gen: NOT DETECTED

## 2022-09-03 LAB — CULTURE, RESPIRATORY W GRAM STAIN: Culture: NORMAL

## 2022-09-03 LAB — MAGNESIUM: Magnesium: 2 mg/dL (ref 1.7–2.4)

## 2022-09-03 LAB — GLUCOSE, PLEURAL OR PERITONEAL FLUID: Glucose, Fluid: 108 mg/dL

## 2022-09-03 SURGERY — CANCELLED PROCEDURE

## 2022-09-03 MED ORDER — HEPARIN (PORCINE) 25000 UT/250ML-% IV SOLN
750.0000 [IU]/h | INTRAVENOUS | Status: DC
  Administered 2022-09-03: 850 [IU]/h via INTRAVENOUS
  Administered 2022-09-04: 750 [IU]/h via INTRAVENOUS
  Filled 2022-09-03: qty 250

## 2022-09-03 MED ORDER — DILTIAZEM HCL-DEXTROSE 125-5 MG/125ML-% IV SOLN (PREMIX)
5.0000 mg/h | INTRAVENOUS | Status: DC
  Administered 2022-09-03: 5 mg/h via INTRAVENOUS
  Administered 2022-09-04: 10 mg/h via INTRAVENOUS
  Administered 2022-09-04: 5 mg/h via INTRAVENOUS
  Filled 2022-09-03 (×3): qty 125

## 2022-09-03 MED ORDER — METOPROLOL TARTRATE 5 MG/5ML IV SOLN
2.5000 mg | Freq: Once | INTRAVENOUS | Status: DC
  Filled 2022-09-03: qty 5

## 2022-09-03 MED ORDER — METOPROLOL TARTRATE 5 MG/5ML IV SOLN
5.0000 mg | Freq: Once | INTRAVENOUS | Status: AC
  Administered 2022-09-03: 5 mg via INTRAVENOUS
  Filled 2022-09-03: qty 5

## 2022-09-03 MED ORDER — CHLORHEXIDINE GLUCONATE CLOTH 2 % EX PADS
6.0000 | MEDICATED_PAD | Freq: Every day | CUTANEOUS | Status: DC
  Administered 2022-09-04 – 2022-09-14 (×11): 6 via TOPICAL

## 2022-09-03 MED ORDER — DILTIAZEM HCL-DEXTROSE 125-5 MG/125ML-% IV SOLN (PREMIX)
5.0000 mg/h | INTRAVENOUS | Status: AC
  Administered 2022-09-03: 5 mg/h via INTRAVENOUS
  Filled 2022-09-03: qty 125

## 2022-09-03 MED ORDER — ORAL CARE MOUTH RINSE: 15.0000 mL | OROMUCOSAL | Status: DC | PRN

## 2022-09-03 MED ORDER — LACTATED RINGERS IV BOLUS
250.0000 mL | Freq: Once | INTRAVENOUS | Status: AC
  Administered 2022-09-03: 250 mL via INTRAVENOUS

## 2022-09-03 MED ORDER — DILTIAZEM HCL 30 MG PO TABS
30.0000 mg | ORAL_TABLET | Freq: Four times a day (QID) | ORAL | Status: DC
  Administered 2022-09-03: 30 mg via ORAL
  Filled 2022-09-03: qty 1

## 2022-09-03 MED ORDER — ONDANSETRON HCL 4 MG/2ML IJ SOLN
4.0000 mg | Freq: Once | INTRAMUSCULAR | Status: AC
  Administered 2022-09-03: 4 mg via INTRAVENOUS
  Filled 2022-09-03: qty 2

## 2022-09-03 MED ORDER — DILTIAZEM HCL 60 MG PO TABS
60.0000 mg | ORAL_TABLET | Freq: Four times a day (QID) | ORAL | Status: DC
  Administered 2022-09-03: 60 mg via ORAL
  Filled 2022-09-03: qty 1

## 2022-09-03 NOTE — Progress Notes (Addendum)
       Overnight   NAME: Korinne Diiorio MRN: [id number] DOB : Jul 16, 1950    Date of Service   09/03/2022   HPI/Events of Note   Notified by RN for consistent Afib in 130-160s  Patient found to have Temperature >100F Initially will attempt Temperature control with meds and environmental control , may require Cardizem drip if all meds ineffective    Interventions/ Plan   Temperature control Pain control as needed Consider Cardizem Drip      Update: Cardizem drip started currently being titrated. During bedside visit patient temperature decreased to normal range Heart rate is now normal range with Cardizem drip running Cardizem drip is being titrated down currently Lactated Ringer's fluid bolus in progress.   Gershon Cull BSN MSNA MSN ACNPC-AG Acute Care Nurse Practitioner [place]

## 2022-09-03 NOTE — Progress Notes (Signed)
Pharmacy: [facility]  Pt's a 71 y.o F with metastatic lung cancer to bones and hx afib on Eliquis PTA who presented to the ED on 08/29/22 with c/o SOB. During this admission, she was found to have acute CVA and concern for hypermetabolic activity in the rectum. She's now on heparin drip while Eliquis is on hold.   - 3/17: She was scheduled for flexible sigmoidoscopy today, but procedure was cancelled due to her clinical status. She had thoracentesis at ~4p.  - aPTT collected at 6p was supra-therapeutic at 131 secs, but this was drawn on the same arm that heparin was infusing.  Repeat heparin level collected at 7:30p is 105 secs. - per pt's RN, no bleeding noted   Goal of Therapy:  Heparin level 0.3-0.5 units/ml aPTT 66-85 seconds Monitor platelets by anticoagulation protocol: Yes  Plan: - Decrease heparin drip to 750 units/hr - check 8 hr aPTT and heparin level  - monitor for s/[place] bleeding  Dia Sitter, PharmD, BCPS 09/03/2022 8:28 PM

## 2022-09-03 NOTE — Progress Notes (Signed)
PROGRESS NOTE    Rhonda Blackwell  [id number] DOB: 1950/11/04 DOA: 08/29/2022 PCP: [name], MD    Chief Complaint  Patient presents with   Tachycardia    Brief Narrative:  Rhonda Blackwell is a 72 y.o. female with medical history significant of COPD, Type 2 diabetes, CVA, hypertension atrial fibrillation on Eliquis, metastatic disease with unknown primary presented to the hospital with increasing shortness of breath.  Of note, patient was recently admitted between  07/23/22 to 07/27/22 with sepsis secondary to community-acquired multifocal pneumonia.  She was discharged on 2 L nasal cannula at rest and up to 3 L nasal cannula on ambulation.  Patient was also recently diagnosed atrial fibrillation and was on Eliquis.  Ever since her discharge, patient was feeling unwell and had persistent shortness of breath and had ran out of her Eliquis after 30 days.  History of hypermetabolic left upper lung nodule as well as hypermetabolic hilar and mediastinal lymph nodes, left scapular lesion, area of right acetabulum and rectum, concerning for metastatic disease on PET scan.  Patient was followed by [facility] pulmonology/oncology, underwent CT-guided percutaneous biopsy of left scapular lytic bone lesion on 06/08/2022 and bronchoscopy with EBUS on 07/04/2022.  Pt then did not follow up with oncology on multiple appointments.    In the ED, on this presentation, she was afebrile and normotensive.  Initially noted to be in atrial fibrillation with RVR but this  resolved spontaneously.  She was initially was placed on 2 L, however continued have significant oxygen desaturation was ultimately placed on 8 L high flow nasal cannula.  Mild leukocytosis at 12.5.  Troponin was 30. Negative flu/COVID/RSV. CT chest was obtained which showed masslike consolidation within the left upper lobe abutting the left hilar concerning for central obstructing neoplasm.  There is peripheral areas of consolidation and interlobar  septal thickening throughout the left upper lobe concerning for combination of postobstructive changes, regional metastatic disease and lymphangitic spread of tumor.  Mediastinal and hilar adenopathy consistent with nodule metastasis. 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 EDP consulted pulmonology and patient was admitted to the hospital for further evaluation and treatment.   Assessment & Plan:   Principal Problem:   Acute on chronic respiratory failure with hypoxia (HCC) Active Problems:   Type 2 diabetes mellitus without complications (HCC)   Essential hypertension   Paroxysmal atrial fibrillation with RVR (HCC)   History of CVA (cerebrovascular accident)   Chronic anxiety   Tobacco use   Malignant neoplasm of hilus of left lung (HCC)   Lung mass   Acute CVA (cerebrovascular accident) ([place])   Pleural effusion on left  #1 acute on chronic respiratory failure with hypoxia/lung mass -Patient noted to have been on 2 L of O2 at baseline by needed up to 15 L of O2 initially on presentation, O2 requirements initially improving however patient went into A-fib overnight, increased O2 requirements, chest x-ray done this morning concerning for large left pleural effusion. -Patient initially noted to have been discharged from oncology office due to lack of follow-up for scheduled appointments. -CT chest during this hospitalization showed masslike consolidation within the left upper lobe abutting the left hilar concerning for central obstructing neoplasm.  Peripheral areas of consolidation interlobar septal thickening throughout the left upper lobe concerning for combination of postobstructive changes, regional metastatic disease and lymphangitic spread of tumor. -Patient currently on IV Zosyn due to concern for postobstructive pneumonia. -Patient's presenting symptoms of respiratory distress  likely secondary to mechanical obstruction with  postobstructive pneumonia. -Patient seen by PCCM and recommended consultation with oncology and radiation oncology. -Patient subsequently transferred from [facility]. -Oncology is currently following and concern for adenocarcinoma of the lung, oncology also concern about findings of PET scan with hypermetabolic activity in the rectum and recommending GI evaluation.. -Patient seen by radiation oncology and underwent simulation and first treatment 09/01/2022. -Radiation oncology recommended to continue first few treatments of radiation while in-house and if and when patient better and stable could complete a 2-week course in the outpatient setting. -Due to large left pleural effusion will have patient reassessed by PCCM for further evaluation and management. -Appreciate oncology and radiation oncology input and recommendations.  2.  Hypokalemia -Repleted.   -Potassium at 3.6.   3.  Small acute infarct left frontal white matter/prior history of CVA -MRI done 09/02/2022 for staging with small acute infarct in the left frontal white matter.  Chronic small vessel ischemia with multiple chronic lacunar infarcts.  Multiple calvarial metastases, largest measuring 2 cm in the right parietal bone with transcortical growth.  Negative for brain metastases. -Patient noted out to have been on Eliquis throughout the hospitalization and subsequently transition to heparin as patient noted to be scheduled for flexible sigmoidoscopy to be done 09/03/2022 however canceled as patient noted to go into A-fib with RVR.  -Stroke workup underway. -CT angiogram head and neck done with no LVO.  Multiple,.  Metastases largest located in the right parietal bone with transcortical extension, masslike consolidation in the left upper lobe as characterized on recent chest CT, bilateral pleural effusions. -Patient with recent 2D echo and as such 2D echo not obtained.  -Patient noted to have passed bedside  swallow evaluation.  -Fasting lipid panel with an LDL of 51, total cholesterol 114. -SLP, PT, OT. -Hemoglobin A1c noted at 6.3 on 07/24/2022. -Continue statin. -Currently on heparin drip. -Neurology consulted and recommended continuation of heparin drip and may switch back to Eliquis when no further procedures are planned. -Appreciate neurology input and recommendations.  4 large left pleural effusion -Noted on chest x-ray. -Patient admitted with acute on chronic respiratory failure with concerns for right upper lobe mass and concern for postobstructive pneumonia. -Consult with pulmonary to reevaluate for further management of large left pleural effusion.  5.  Hypomagnesemia -Repleted. -Magnesium at 2.0 from 1.6. -Continue oral magnesium supplementation. -Repeat labs in the AM.  6.  Hypertension -BP currently controlled on Cardizem, Lasix, Avapro, labetalol.  7.  Tobacco use -Tobacco cessation already done per prior MD.  8.  Type 2 diabetes mellitus without complications -Hemoglobin A1c noted at 6.3 on 07/24/2022. -CBG 155 this morning.   -SSI.    9.  Paroxysmal atrial fibrillation with RVR -Patient noted to have been on Cardizem and labetalol for rate control.   -Was on Eliquis and subsequently transition to heparin in anticipation of possible flexible sigmoidoscopy.   -Patient noted to go into A-fib with RVR overnight with some associated shortness of breath currently on Cardizem drip.   -Patient noted to have converted back to normal sinus rhythm at 3 AM this morning.  -Placed on Cardizem 60 mg p.o. every 6 hours, discontinue Cardizem drip 2 hours after oral Cardizem is being given.   -Continue home regimen labetalol for better rate control.   -Heparin resumed as flex sigmoidoscopy has been canceled.    10.  Chronic anxiety -Continue Cymbalta.    11.  Abnormal PET scan -Patient noted to  have an abnormal PET scan November 2023 that showed hypermetabolic activity in the rectum  without any discrete CT correlation possibly reflecting physiologic activity. -Oncology recommending GI evaluation. -Patient seen in consultation by GI 09/02/2022 who are recommending a flexible sigmoidoscopy which was to be done today for further evaluation of abnormal finding noted on PET scan concerning for metabolic Activity in the rectum, however due to patient's A-fib overnight and on Cardizem drip, anesthesia recommended procedure be rescheduled and as such procedure canceled for today.  -Per GI.   DVT prophylaxis: Heparin drip. Code Status: Full Family Communication: Updated patient.  No family at bedside. Disposition: TBD  Status is: Inpatient Remains inpatient appropriate because: Severity of illness   Consultants:  Hematology/oncology: Dr. Marin Olp 08/31/2022 Gastroenterology: Dr. Alessandra Bevels 09/02/2022 Radiation oncology: Dr. Tammi Klippel 08/31/2022 PCCM: Dr.Olalere 08/29/2022 Neurology: Dr.Khaliqdina 09/03/2022  Procedures:  CT chest 08/29/2022 Chest x-ray 08/29/2022, 09/03/2022 MRI brain 09/02/2022 Bone scan 09/01/2022  Antimicrobials:  Anti-infectives (From admission, onward)    Start     Dose/Rate Route Frequency Ordered Stop   08/31/22 2200  piperacillin-tazobactam (ZOSYN) IVPB 3.375 g        3.375 g 12.5 mL/hr over 240 Minutes Intravenous Every 8 hours 08/31/22 1840     08/29/22 2315  piperacillin-tazobactam (ZOSYN) IVPB 3.375 g  Status:  Discontinued        3.375 g 12.5 mL/hr over 240 Minutes Intravenous Every 8 hours 08/29/22 2134 08/31/22 1540         Subjective: Patient laying in bed eating breakfast.  States she felt pretty short of breath last night however shortness of breath is improved this morning.  Noted to have gone into A-fib overnight requiring Cardizem drip.  Currently on Cardizem drip at 5 mics per hour.  Denies any chest pain.  No abdominal pain.  Still with poor appetite.   Objective: Vitals:   09/03/22 1511 09/03/22 1515 09/03/22 1524 09/03/22 1534   BP:    101/64  Pulse: (!) 107   74  Resp: (!) 29     Temp:    99.4 F (37.4 C)  TempSrc:    Oral  SpO2: (!) 84% 93% 91% 98%  Weight:      Height:        Intake/Output Summary (Last 24 hours) at 09/03/2022 1537 Last data filed at 09/03/2022 1030 Gross per 24 hour  Intake 1604.55 ml  Output 2150 ml  Net -545.45 ml   Filed Weights   08/31/22 0054 08/31/22 1600 09/02/22 0526  Weight: 66.3 kg 65.4 kg 65.4 kg    Examination:  General exam: Appears calm and comfortable  Respiratory system: Some scattered coarse breath sounds anterior lung fields.  Decreased breath sounds on the left.  Fair air movement.  Speaking in full sentences.   Cardiovascular system: Irregularly irregular.  No JVD.  No murmurs rubs or gallops.  No lower extremity edema. Gastrointestinal system: Abdomen is soft, nontender, nondistended, positive bowel sounds.  No rebound.  No guarding.  Central nervous system: Alert and oriented. No focal neurological deficits. Extremities: Symmetric 5 x 5 power. Skin: No rashes, lesions or ulcers Psychiatry: Judgement and insight appear normal. Mood & affect appropriate.     Data Reviewed: I have personally reviewed following labs and imaging studies  CBC: Recent Labs  Lab 08/29/22 1400 08/30/22 0337 08/31/22 0030 09/01/22 0545 09/02/22 0524 09/03/22 0351  WBC 12.5* 10.5 11.8* 10.7* 9.9 11.0*  NEUTROABS 9.9*  --   --   --   --  9.4*  HGB 12.2 10.4* 10.7* 10.8* 11.0* 11.1*  HCT 38.2 32.6* 32.7* 34.0* 33.5* 35.3*  MCV 79.4* 78.9* 76.9* 78.2* 77.2* 77.6*  PLT 575* 450* 409* 426* 465* 456*    Basic Metabolic Panel: Recent Labs  Lab 08/30/22 0337 08/31/22 0030 09/01/22 0545 09/02/22 0524 09/03/22 0351  NA 135 135 136 131* 132*  K 3.2* 4.0 3.6 3.3* 3.6  CL 98 100 94* 94* 94*  CO2 26 26 30 29 26   GLUCOSE 112* 129* 145* 141* 177*  BUN 10 6* 10 14 13   CREATININE 0.76 0.62 0.57 0.67 0.79  CALCIUM 8.6* 9.1 9.2 8.9 8.8*  MG  --  1.6* 2.0  --  2.0     GFR: Estimated Creatinine Clearance: 55.7 mL/min (by C-G formula based on SCr of 0.79 mg/dL).  Liver Function Tests: Recent Labs  Lab 08/29/22 1400 09/02/22 0524  AST 18 13*  ALT 12 13  ALKPHOS 97 95  BILITOT 0.7 1.2  PROT 7.1 6.9  ALBUMIN 3.2* 3.0*    CBG: Recent Labs  Lab 09/02/22 2113 09/03/22 0150 09/03/22 0152 09/03/22 0735 09/03/22 1205  GLUCAP 176* 180* 168* 155* 284*     Recent Results (from the past 240 hour(s))  Resp panel by RT-PCR (RSV, Flu A&B, Covid) Anterior Nasal Swab     Status: None   Collection Time: 08/29/22  2:07 PM   Specimen: Anterior Nasal Swab  Result Value Ref Range Status   SARS Coronavirus 2 by RT PCR NEGATIVE NEGATIVE Final   Influenza A by PCR NEGATIVE NEGATIVE Final   Influenza B by PCR NEGATIVE NEGATIVE Final    Comment: (NOTE) The Xpert Xpress SARS-CoV-2/FLU/RSV plus assay is intended as an aid in the diagnosis of influenza from Nasopharyngeal swab specimens and should not be used as a sole basis for treatment. Nasal washings and aspirates are unacceptable for Xpert Xpress SARS-CoV-2/FLU/RSV testing.  Fact Sheet for Patients: EntrepreneurPulse.com.au  Fact Sheet for Healthcare Providers: IncredibleEmployment.be  This test is not yet approved or cleared by the [place] FDA and has been authorized for detection and/or diagnosis of SARS-CoV-2 by FDA under an Emergency Use Authorization (EUA). This EUA will remain in effect (meaning this test can be used) for the duration of the COVID-19 declaration under Section 564(b)(1) of the Act, 21 U.S.C. section 360bbb-3(b)(1), unless the authorization is terminated or revoked.     Resp Syncytial Virus by PCR NEGATIVE NEGATIVE Final    Comment: (NOTE) Fact Sheet for Patients: EntrepreneurPulse.com.au  Fact Sheet for Healthcare Providers: IncredibleEmployment.be  This test is not yet approved or cleared  by the [place] FDA and has been authorized for detection and/or diagnosis of SARS-CoV-2 by FDA under an Emergency Use Authorization (EUA). This EUA will remain in effect (meaning this test can be used) for the duration of the COVID-19 declaration under Section 564(b)(1) of the Act, 21 U.S.C. section 360bbb-3(b)(1), unless the authorization is terminated or revoked.  Performed at [facility], [place] [address] [place], [place] [address]   Expectorated Sputum Assessment w Gram Stain, Rflx to Resp Cult     Status: None (Preliminary result)   Collection Time: 08/29/22  9:22 PM   Specimen: Expectorated Sputum  Result Value Ref Range Status   Specimen Description EXPECTORATED SPUTUM  Final   Special Requests NONE  Final   Sputum evaluation   Final    THIS SPECIMEN IS ACCEPTABLE FOR SPUTUM CULTURE Performed at [facility], [place],  [place] [address]    Report Status PENDING  Incomplete  Culture, Respiratory w Gram Stain     Status: None   Collection Time: 08/29/22  9:22 PM  Result Value Ref Range Status   Specimen Description EXPECTORATED SPUTUM  Final   Special Requests NONE Reflexed from [id number]  Final   Gram Stain   Final    RARE WBC PRESENT, PREDOMINANTLY MONONUCLEAR NO ORGANISMS SEEN    Culture   Final    RARE Normal respiratory flora-no Staph aureus or Pseudomonas seen Performed at [facility], [place] [address], [place], [place] [address]    Report Status 09/03/2022 FINAL  Final         Radiology Studies: DG CHEST PORT 1 VIEW  Result Date: 09/03/2022 CLINICAL DATA:  Shortness of breath EXAM: PORTABLE CHEST 1 VIEW COMPARISON:  08/29/2022 FINDINGS: Cardiac shadow is stable but obscured by enlarging left-sided pleural effusion and left opacity. Right lung remains clear. No bony abnormality is noted. IMPRESSION: Significant increased opacity in the left hemithorax consistent with new pleural effusion and likely increasing consolidation.  These results will be called to the ordering clinician or representative by the Radiologist Assistant, and communication documented in the PACS or [employer]. Electronically Signed   By: Inez Catalina M.D.   On: 09/03/2022 12:00   CT ANGIO HEAD NECK W WO CM  Result Date: 09/02/2022 CLINICAL DATA:  Acute neurologic deficit EXAM: CT ANGIOGRAPHY HEAD AND NECK TECHNIQUE: Multidetector CT imaging of the head and neck was performed using the standard protocol during bolus administration of intravenous contrast. Multiplanar CT image reconstructions and MIPs were obtained to evaluate the vascular anatomy. Carotid stenosis measurements (when applicable) are obtained utilizing NASCET criteria, using the distal internal carotid diameter as the denominator. RADIATION DOSE REDUCTION: This exam was performed according to the departmental dose-optimization program which includes automated exposure control, adjustment of the mA and/or kV according to patient size and/or use of iterative reconstruction technique. CONTRAST:  41mL OMNIPAQUE IOHEXOL 350 MG/ML SOLN COMPARISON:  None Available. FINDINGS: CT HEAD FINDINGS Brain: There is no mass, hemorrhage or extra-axial collection. The size and configuration of the ventricles and extra-axial CSF spaces are normal. Multiple old infarcts of the deep gray nuclei. There is hypoattenuation of the periventricular white matter, most commonly indicating chronic ischemic microangiopathy. Incidentally noted partially empty sella. Skull: Multiple lucent lesions of the calvarium, largest located in the right parietal bone with transcortical extension. Sinuses/Orbits: No fluid levels or advanced mucosal thickening of the visualized paranasal sinuses. No mastoid or middle ear effusion. The orbits are normal. CTA NECK FINDINGS SKELETON: There is no bony spinal canal stenosis. No lytic or blastic lesion. OTHER NECK: Normal pharynx, larynx and major salivary glands. No cervical lymphadenopathy.  Unremarkable thyroid gland. UPPER CHEST: Consolidative opacity in the left upper lobe. Bilateral pleural effusions. AORTIC ARCH: There is calcific atherosclerosis of the aortic arch. There is no aneurysm, dissection or hemodynamically significant stenosis of the visualized portion of the aorta. Conventional 3 vessel aortic branching pattern. The visualized proximal subclavian arteries are widely patent. RIGHT CAROTID SYSTEM: No dissection, occlusion or aneurysm. Mild atherosclerotic calcification at the carotid bifurcation without hemodynamically significant stenosis. LEFT CAROTID SYSTEM: No dissection, occlusion or aneurysm. Mild atherosclerotic calcification at the carotid bifurcation without hemodynamically significant stenosis. VERTEBRAL ARTERIES: Codominant configuration. Both origins are clearly patent. There is no dissection, occlusion or flow-limiting stenosis to the skull base (V1-V3 segments). CTA HEAD FINDINGS POSTERIOR CIRCULATION: --Vertebral arteries: Normal V4 segments. --Inferior cerebellar  arteries: Normal. --Basilar artery: Normal. --Superior cerebellar arteries: Normal. --Posterior cerebral arteries (PCA): Normal. ANTERIOR CIRCULATION: --Intracranial internal carotid arteries: Normal. --Anterior cerebral arteries (ACA): Normal. Both A1 segments are present. Patent anterior communicating artery (a-comm). --Middle cerebral arteries (MCA): Normal. VENOUS SINUSES: As permitted by contrast timing, patent. ANATOMIC VARIANTS: None Review of the MIP images confirms the above findings. IMPRESSION: 1. No emergent large vessel occlusion or hemodynamically significant stenosis of the head or neck. 2. 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 3. Masslike consolidation in the left upper lobe, as characterized on recent chest CT. 4. Bilateral pleural effusions. Aortic atherosclerosis (ICD10-I70.0). Electronically Signed   By: Ulyses Jarred M.D.   On:  09/02/2022 23:17   MR BRAIN W WO CONTRAST  Result Date: 09/02/2022 CLINICAL DATA:  Non-small cell lung cancer staging EXAM: MRI HEAD WITHOUT AND WITH CONTRAST TECHNIQUE: Multiplanar, multiecho pulse sequences of the brain and surrounding structures were obtained without and with intravenous contrast. CONTRAST:  6.35mL GADAVIST GADOBUTROL 1 MMOL/ML IV SOLN COMPARISON:  None Available. FINDINGS: Brain: Focus of restricted diffusion in the high left frontal white matter is nonenhancing and elongated, consistent with lacunar infarct. Multiple lacunar infarcts are seen in the deep cerebral white matter with adjacent chronic ischemic gliosis. Chronic left basal ganglia perforator infarct. Generalized cerebral volume loss. No enhancing metastatic disease to the brain. Vascular: Major flow voids and vascular enhancements are preserved Skull and upper cervical spine: Multiple bone lesions, most notably a 2 cm deposit in the posterior right parietal bone extending through both inner and outer table. 15 mm lesion at the left paramedian vertex reaches the inner table as do some smaller right parietal bone metastases. Sinuses/Orbits: Negative IMPRESSION: 1. Small acute infarct in the left frontal white matter. Chronic small vessel ischemia with multiple chronic lacunar infarcts. 2. 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 3. Negative for brain metastasis. Electronically Signed   By: Jorje Guild M.D.   On: 09/02/2022 12:38        Scheduled Meds:  atorvastatin  40 mg Oral Daily   diltiazem  60 mg Oral Q6H   dronabinol  2.5 mg Oral BID AC   DULoxetine  60 mg Oral BID   fluticasone  2 spray Each Nare Daily   furosemide  20 mg Oral Daily   insulin aspart  0-6 Units Subcutaneous TID WC   irbesartan  150 mg Oral BID   labetalol  100 mg Oral BID   magnesium oxide  400 mg Oral BID   metoprolol tartrate  2.5 mg Intravenous Once    mometasone-formoterol  2 puff Inhalation BID   pantoprazole  40 mg Oral Daily   Continuous Infusions:  sodium chloride     ferric gluconate (FERRLECIT) IVPB 250 mg (09/03/22 1013)   heparin 850 Units/hr (09/03/22 0925)   piperacillin-tazobactam 3.375 g (09/03/22 1304)     LOS: 5 days    Time spent: 45 minutes    Irine Seal, MD [facility]   To contact the attending provider between 7A-7P or the covering provider during after hours 7P-7A, please log into the web site [email or web address] and access using universal  password for that web site. If you do not have the password, please call the hospital operator.  09/03/2022, 3:37 PM

## 2022-09-03 NOTE — Progress Notes (Signed)
   09/03/22 0145  Vitals  Temp (!) 100.5 F (38.1 C)  Temp Source Oral  BP 114/86  MAP (mmHg) 97  BP Location Left Arm  BP Method Automatic  Patient Position (if appropriate) Lying  Pulse Rate (!) 153 (RN at Bedside)  Pulse Rate Source Dinamap  ECG Heart Rate (!) 144  Resp (!) 26  MEWS COLOR  MEWS Score Color Red  Oxygen Therapy  SpO2 (!) 89 %  O2 Device Nasal Cannula  O2 Flow Rate (L/min) 7 L/min  MEWS Score  MEWS Temp 1  MEWS Systolic 0  MEWS Pulse 3  MEWS RR 2  MEWS LOC 0  MEWS Score 6  Provider Notification  Provider Name/Title Gershon Cull.NP  Date Provider Notified 09/03/22  Time Provider Notified [phone]  Method of Notification  (Secure chat)  Notification Reason New onset of dysrhythmia  Provider response See new orders  Date of Provider Response 09/03/22  Time of Provider Response 0147  Rapid Response Notification  Name of Rapid Response RN Notified Lorretta Harp RN  Date Rapid Response Notified 09/03/22  Time Rapid Response Notified 0150

## 2022-09-03 NOTE — Progress Notes (Signed)
ANTICOAGULATION CONSULT NOTE - Follow-up Consult  Pharmacy Consult for heparin Indication:  atrial fibrillation, Eliquis currently on hold  Allergies  Allergen Reactions   Bystolic [Nebivolol Hcl] Nausea Only and Other (See Comments)    Abdominal pain   Hydrochlorothiazide Nausea Only and Swelling    Leg swelling   Norvasc [Amlodipine] Swelling    Leg swelling    Patient Measurements: Height: 5\' 4"  (162.6 cm) Weight: 65.4 kg (144 lb 2.9 oz) IBW/kg (Calculated) : 54.7 Heparin Dosing Weight: 65.4 kg  Vital Signs: Temp: 99.2 F (37.3 C) (03/17 0700) Temp Source: Oral (03/17 0700) BP: 119/59 (03/17 0700) Pulse Rate: 79 (03/17 0700)  Labs: Recent Labs    09/01/22 0545 09/02/22 0524 09/02/22 2248 09/03/22 0351  HGB 10.8* 11.0*  --  11.1*  HCT 34.0* 33.5*  --  35.3*  PLT 426* 465*  --  456*  APTT  --   --  55*  --   HEPARINUNFRC  --   --   --  >1.10*  CREATININE 0.57 0.67  --  0.79     Estimated Creatinine Clearance: 55.7 mL/min (by C-G formula based on SCr of 0.79 mg/dL).   Medical History: Past Medical History:  Diagnosis Date   Bronchitis    COPD (chronic obstructive pulmonary disease) ([place])    Diabetes mellitus without complication ([place])    Hypertension      Assessment: 72 year old female on Eliquis for atrial fibrillation, which has been continued while admitted - last dose 3/15 2100 then held in anticipation of port placement. 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 Abnormal findings noted on PET scan concerning for hypermetabolic activity in the rectum, GI is planning flexible sigmoidoscopy 3/17 (currently on schedule for 10 am). 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 Further stroke workup by Neurology. Pharmacy consulted for heparin management by Neurology for atrial fibrillation - neuro scale.  Significant Events: 3/17: heparin drip stopped at 0400 for planned flex sig; procedure cancelled  due to change in cardiac and respiratory status. Heparin drip restarted at 0930  09/03/22 aPTT = 55 sec last night (subtherapeutic) with heparin gtt @ 750 units/hr Heparin level >1.1 as expected with recent DOAC use CBC: Hgb stable at 11.1, Plts WNL No bleeding noted per RN  Goal of Therapy:  Heparin level 0.3-0.5 units/ml aPTT 66-85 seconds Monitor platelets by anticoagulation protocol: Yes   Plan:  -Restart heparin infusion at increased rate of 850 units/hr -Check aPTT 8hrs after restart. Anticipate heparin level will be elevated in setting of recent Eliquis use - will follow aPTT until correlation with heparin level -Daily heparin level, CBC while on heparin -Follow up plans for potential reschedule of GI procedure and/or resumption of DOAC   Peggyann Juba, PharmD, BCPS Pharmacy: [phone] 09/03/2022 9:06 AM

## 2022-09-03 NOTE — Progress Notes (Signed)
OT Cancellation Note  Patient Details Name: Taleia Nishihara MRN: [id number] DOB: 1950/11/07   Cancelled Treatment:    Reason Eval/Treat Not Completed: Medical issues which prohibited therapy Patient back into A fib with nurse asking for therapy to hold at this time. OT to continue to follow and check back as schedule will allow.  Rennie Plowman, MS Acute Rehabilitation Department Office# [phone]  09/03/2022, 2:53 PM

## 2022-09-03 NOTE — Plan of Care (Signed)
  Problem: Coping: Goal: Ability to adjust to condition or change in health will improve Outcome: Progressing   Problem: Skin Integrity: Goal: Risk for impaired skin integrity will decrease Outcome: Progressing   Problem: Tissue Perfusion: Goal: Adequacy of tissue perfusion will improve Outcome: Progressing   Problem: Education: Goal: Knowledge of disease or condition will improve Outcome: Progressing Goal: Knowledge of secondary prevention will improve (MUST DOCUMENT ALL) Outcome: Progressing Goal: Knowledge of patient specific risk factors will improve Elta [place] N/A or DELETE if not current risk factor) Outcome: Progressing   Problem: Ischemic Stroke/TIA Tissue Perfusion: Goal: Complications of ischemic stroke/TIA will be minimized Outcome: Progressing   Problem: Coping: Goal: Will verbalize positive feelings about self Outcome: Progressing Goal: Will identify appropriate support needs Outcome: Progressing

## 2022-09-03 NOTE — Consult Note (Signed)
NEUROLOGY CONSULTATION NOTE   Date of service: September 03, 2022 Patient Name: Rhonda Blackwell MRN:  [id number] DOB:  Apr 30, 1951 Reason for consult: "stroke on MRI brain" Requesting Provider: Eugenie Filler, MD _ _ _   _ __   _ __ _ _  __ __   _ __   __ _  History of Present Illness  Rhonda Blackwell is a 72 y.o. female with medical history significant of COPD, Type 2 diabetes, CVA, hypertension atrial fibrillation on Eliquis, metastatic disease with ?lung primary presented to the hospital 3/12 with increasing shortness of breath.  Patient  MRI 3/16 showed small acute infarct in the left frontal white matter. Chronic small vessel ischemia with multiple chronic lacunar infarcts    Of note, patient was recently admitted between  07/23/22 to 07/27/22 with sepsis secondary to community-acquired multifocal pneumonia.  She was discharged on 2 L nasal cannula at rest and up to 3 L nasal cannula on ambulation.  Patient was also recently diagnosed atrial fibrillation and was on Eliquis.  Ever since her discharge, patient was feeling unwell and had persistent shortness of breath and had ran out of her Eliquis after 30 days.  History of hypermetabolic left upper lung nodule as well as hypermetabolic hilar and mediastinal lymph nodes, left scapular lesion, area of right acetabulum and rectum, concerning for metastatic disease on PET scan.   ROS   Constitutional + weight loss, but denies fever and chills.   HEENT Denies changes in vision and hearing.   Respiratory + SOB and cough.   CV Denies palpitations and CP   GI Denies abdominal pain, nausea, vomiting and diarrhea.   GU Denies dysuria and urinary frequency.   MSK Denies myalgia and joint pain.   Skin Denies rash and pruritus.   Neurological Denies headache and syncope.   Psychiatric Denies recent changes in mood. Denies anxiety and depression.    Past History   Past Medical History:  Diagnosis Date   Bronchitis    COPD (chronic obstructive pulmonary  disease) ([place])    Diabetes mellitus without complication ([place])    Hypertension    Past Surgical History:  Procedure Laterality Date   CESAREAN SECTION     CHOLECYSTECTOMY     FOOT SURGERY     HERNIA REPAIR     Family History  Problem Relation Age of Onset   Diabetes Mellitus II Sister    Social History   Socioeconomic History   Marital status: Married    Spouse name: Not on file   Number of children: Not on file   Years of education: Not on file   Highest education level: Not on file  Occupational History   Not on file  Tobacco Use   Smoking status: Some Days    Packs/day: 1    Types: Cigarettes   Smokeless tobacco: Never  Substance and Sexual Activity   Alcohol use: No   Drug use: No   Sexual activity: Not on file  Other Topics Concern   Not on file  Social History Narrative   Not on file   Social Determinants of Health   Financial Resource Strain: Not on file  Food Insecurity: No Food Insecurity (08/30/2022)   Hunger Vital Sign    Worried About Running Out of Food in the Last Year: Never true    Ran Out of Food in the Last Year: Never true  Transportation Needs: No Transportation Needs (08/30/2022)   PRAPARE - Transportation    Lack  of Transportation (Medical): No    Lack of Transportation (Non-Medical): No  Physical Activity: Not on file  Stress: Not on file  Social Connections: Not on file   Allergies  Allergen Reactions   Bystolic [Nebivolol Hcl] Nausea Only and Other (See Comments)    Abdominal pain   Hydrochlorothiazide Nausea Only and Swelling    Leg swelling   Norvasc [Amlodipine] Swelling    Leg swelling    Medications   Medications Prior to Admission  Medication Sig Dispense Refill Last Dose   acetaminophen (TYLENOL) 650 MG CR tablet Take 650 mg by mouth every 8 (eight) hours as needed for pain.   Past Week   albuterol (PROVENTIL HFA;VENTOLIN HFA) 108 (90 BASE) MCG/ACT inhaler Inhale 2 puffs into the lungs every 6 (six) hours as needed for  shortness of breath (cough). 1 Inhaler 0 UNK   apixaban (ELIQUIS) 5 MG TABS tablet Take 1 tablet (5 mg total) by mouth 2 (two) times daily. 60 tablet 0 Past Week   atorvastatin (LIPITOR) 40 MG tablet Take 40 mg by mouth daily.   08/29/2022   calcium carbonate (OSCAL) 1500 (600 Ca) MG TABS tablet Take 600 mg of elemental calcium by mouth daily.   08/29/2022   diltiazem (CARDIZEM CD) 120 MG 24 hr capsule Take 1 capsule (120 mg total) by mouth daily. 30 capsule 0 Past Week   DULoxetine (CYMBALTA) 60 MG capsule Take 60 mg by mouth 2 (two) times daily.   08/29/2022   fluticasone (FLONASE) 50 MCG/ACT nasal spray Place 2 sprays into both nostrils daily.   08/29/2022   fluticasone-salmeterol (ADVAIR) 250-50 MCG/ACT AEPB Inhale 1 puff into the lungs 2 (two) times daily as needed (wheezing, shortness of breath.).   08/29/2022   furosemide (LASIX) 20 MG tablet Take 20 mg by mouth daily.   08/29/2022   irbesartan (AVAPRO) 150 MG tablet Take 150 mg by mouth 2 (two) times daily.   08/29/2022   labetalol (NORMODYNE) 100 MG tablet Take 100 mg by mouth 2 (two) times daily.   08/29/2022 at 0900   pantoprazole (PROTONIX) 40 MG tablet Take 40 mg by mouth daily.   08/29/2022   sitaGLIPtan-metformin (JANUMET) 50-500 MG per tablet Take 1 tablet by mouth 2 (two) times daily with a meal.   08/29/2022   azithromycin (ZITHROMAX) 250 MG tablet Take 1 tablet (250 mg total) by mouth daily for 2 more days (Patient not taking: Reported on 08/30/2022) 2 each 0 Completed Course     Vitals   Vitals:   09/03/22 0600 09/03/22 0700 09/03/22 1013 09/03/22 1135  BP: 99/64 (!) 119/59 126/66 (!) 114/53  Pulse:  79 83 69  Resp:  (!) 26    Temp:  99.2 F (37.3 C)  99.1 F (37.3 C)  TempSrc:  Oral  Oral  SpO2:  93%  94%  Weight:      Height:         Body mass index is 24.75 kg/m.  Physical Exam   General: Laying comfortably in bed; in no acute distress.  HENT: Normal oropharynx and mucosa. Normal external appearance of ears and nose.   Neck: Supple, no pain or tenderness  CV: No JVD. No peripheral edema.  Pulmonary: Symmetric Chest rise. Normal respiratory effort.  Abdomen: Soft to touch, non-tender.  Ext: No cyanosis, edema, or deformity  Skin: No rash. Normal palpation of skin.   Musculoskeletal: Normal digits and nails by inspection. No clubbing.   Neurologic Examination  Mental status/Cognition: Alert,  oriented to self, place, month and year, good attention.  Speech/language: Fluent, comprehension intact, object naming intact, repetition intact.  Cranial nerves:   CN II Pupils equal and reactive to light, no VF deficits    CN III,IV,VI EOM intact, no gaze preference or deviation, no nystagmus    CN V normal sensation in V1, V2, and V3 segments bilaterally    CN VII no asymmetry, no nasolabial fold flattening    CN VIII normal hearing to speech    CN IX & X normal palatal elevation, no uvular deviation    CN XI 5/5 head turn and 5/5 shoulder shrug bilaterally    CN XII midline tongue protrusion    Motor:  Muscle bulk: poor, tone normal  Desats to below 88 when exerting any significant effort. Mvmt Root Nerve  Muscle Right Left Comments  SA C5/6 Ax Deltoid     EF C5/6 Mc Biceps 5 5   EE C6/7/8 Rad Triceps 5 5   WF C6/7 Med FCR     WE C7/8 PIN ECU     F Ab C8/T1 U ADM/FDI 5 5   HF L1/2/3 Fem Illopsoas 5 5   KE L2/3/4 Fem Quad     DF L4/5 D Peron Tib Ant 5 5   PF S1/2 Tibial Grc/Sol 5 5    Sensation:  Light touch Intact throughout   Pin prick    Temperature    Vibration   Proprioception    Coordination/Complex Motor:  - Finger to Nose intact BL - Heel to shin unalbe to do due to shortness of breath - Rapid alternating movement are slowed - Gait: deferred for patient safety.  Labs   CBC:  Recent Labs  Lab 08/29/22 1400 08/30/22 0337 09/02/22 0524 09/03/22 0351  WBC 12.5*   < > 9.9 11.0*  NEUTROABS 9.9*  --   --  9.4*  HGB 12.2   < > 11.0* 11.1*  HCT 38.2   < > 33.5* 35.3*  MCV 79.4*    < > 77.2* 77.6*  PLT 575*   < > 465* 456*   < > = values in this interval not displayed.    Basic Metabolic Panel:  Lab Results  Component Value Date   NA 132 (L) 09/03/2022   K 3.6 09/03/2022   CO2 26 09/03/2022   GLUCOSE 177 (H) 09/03/2022   BUN 13 09/03/2022   CREATININE 0.79 09/03/2022   CALCIUM 8.8 (L) 09/03/2022   GFRNONAA >60 09/03/2022   GFRAA >60 08/02/2015   Lipid Panel:  Lab Results  Component Value Date   LDLCALC 51 09/03/2022   HgbA1c:  Lab Results  Component Value Date   HGBA1C 6.3 (H) 07/24/2022   Urine Drug Screen:     Component Value Date/Time   LABOPIA NONE DETECTED 08/31/2022 0510   COCAINSCRNUR NONE DETECTED 08/31/2022 0510   LABBENZ NONE DETECTED 08/31/2022 0510   AMPHETMU NONE DETECTED 08/31/2022 0510   THCU NONE DETECTED 08/31/2022 0510   LABBARB NONE DETECTED 08/31/2022 0510    Alcohol Level No results found for: "ETH"  CT angio Head and Neck with contrast(Personally reviewed): 1. No emergent large vessel occlusion or hemodynamically significant stenosis of the head or neck. 2. 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 3. Masslike consolidation in the left upper lobe, as characterized on recent chest CT. 4. Bilateral pleural effusions.  MRI Brain(Personally reviewed): 1. Small acute infarct in the left frontal white matter. Chronic  small vessel ischemia with multiple chronic lacunar infarcts. 2. 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 3. Negative for brain metastasis.  Impression   72 y.o. female with medical history significant of COPD, Type 2 diabetes, CVA, hypertension atrial fibrillation on Eliquis, metastatic disease with ?lung primary presented to the hospital 3/12 with increasing shortness of breath.  Patient  MRI 3/16 showed small acute infarct in the left frontal white matter. Chronic small  vessel ischemia with multiple chronic lacunar infarcts   She has no focal deficit, the noted stroke is incidental. Likely etiology of stroke is embolic in the setting of running out of eliquis. The noted area on the MRI is not typical location for perforator/small vessels.  Recommendations  - Frequent Neuro checks per stroke unit protocol - no need for TTE given known hx of Afibb. - LDL is 51. Continue lipitor 40 daily - most recent HbA1c of 6.3 from a month ago - Hep gtt for now. Can switch back to Eliquis when no further procedures are planned. - SBP goal - aim for gradual normotension - Recommend Telemetry monitoring - Recommend bedside swallow screen prior to PO intake. - Stroke education booklet - Recommend PT/OT/SLP consult - we will signoff. Please feel free to contact us with any questions or concerns.  ______________________________________________________________________   Thank you for the opportunity to take part in the care of this patient. If you have any further questions, please contact the neurology consultation attending.  Signed,  [place] Pager Number [phone] _ _ _   _ __   _ __ _ _  __ __   _ __   __ _

## 2022-09-03 NOTE — Progress Notes (Signed)
Was called by RN that patient went back into rapid A-fib with heart rate sustaining in the 130s to the 150s with increased O2 requirements currently on 12 L high flow nasal cannula.  Post thoracentesis. Came and assessed patient.  Patient with some complaints of chest pain that she states has been ongoing since admission with no new significant changes.  Patient does endorse some palpitations.  Patient still with some shortness of breath however denies any extreme shortness of breath. General: NAD Respiratory: Some decreased breath sounds on the left.  Some diffuse coarse breath sounds.  No wheezing.  Fair air movement.  On 12 L high flow nasal cannula. Cardiovascular: Irregularly irregular GI: Abdomen is soft, nontender, nondistended, positive bowel sounds.  No rebound.  No guarding. Extremities: No clubbing cyanosis or edema  Assessment/plan #1 acute hypoxic respiratory failure/large left pleural effusion -Likely multifactorial secondary to lung mass, postobstructive pneumonia in the setting of large left pleural effusion. -Patient status post thoracentesis per PCCM this afternoon with 800 cc of dark serous pleural fluid obtained sample sent for cell count, cytology, Gram stain, infection analysis, LDH. -Patient now with increased O2 requirements. -Transferred to stepdown unit for closer monitoring. -BiPAP as needed. -PCCM following and appreciate input and recommendations.  2.  A-fib with RVR Likely secondary to large pleural effusion, acute lung issues with ongoing postobstructive pneumonia or mass. -Patient noted to go into A-fib with RVR overnight, converted back to normal sinus rhythm was initially transitioned to oral Cardizem however patient going in and out of A-fib. -Patient postprocedure noted not to be sustaining heart rates in the 130s to the 150s. -Placed back on Cardizem drip. -Discontinue oral Cardizem. -Continue labetalol. -Continue heparin for anticoagulation.   Time  spent: 40 minutes.

## 2022-09-03 NOTE — Progress Notes (Signed)
Called for pre-procedure report this morning around 0645. RN states patient's respiratory and cardiac status changed overnight, causing an increase in O2 requirement and Cardizem gtt. This RN made Anesthesia provider Dr. Lanetta Inch and GI provider Dr. Alessandra Bevels aware, who chose to cancel the procedure. Cancellation orders placed and recommended to RN per Dr. Alessandra Bevels to restart Heparin gtt.

## 2022-09-03 NOTE — Progress Notes (Signed)
Pt developed a temperature and  A-fib which required a cardizem gtt see MAR. Pt HR returned to SR pt will remain in current location per [facility], NP

## 2022-09-03 NOTE — Progress Notes (Signed)
   NAMEJemmie Blackwell, MRN:  [id number], DOB:  05/01/1951, LOS: 5 ADMISSION DATE:  08/29/2022, CONSULTATION DATE: 08/29/2022 REFERRING MD: Dr. Flossie Buffy, CHIEF COMPLAINT: Shortness of breath, hypoxemic respiratory failure  History of Present Illness:  Patient presented to the hospital with shortness of breath Cough bringing up yellow phlegm, was recently hospitalized early February for pneumonia, sepsis Has had some chest tightness  Known to have A-fib, on anticoagulation  History of COPD, diabetes, hypertension, bronchitis  Recently diagnosed with metastatic cancer from a biopsy of scapula lesion  Known to have a left lung mass, recently had a bronchoscopy in January-patient does not recollect being told about cancer being found from that procedure, notes did not reveal endobronchial lesion on the left side.  Patient was dismissed from oncology practice secondary to multiple missed appointments-has not seen a pulmonologist in a year  She uses oxygen about 2 and half liters at home, same with exertion Uses inhalers  Pertinent  Medical History   Past Medical History:  Diagnosis Date   Bronchitis    COPD (chronic obstructive pulmonary disease) ([place])    Diabetes mellitus without complication ([place])    Hypertension     Significant Hospital Events: Including procedures, antibiotic start and stop dates in addition to other pertinent events   CT reviewed-left upper lobe mass, with probable lymphangitic spread, mediastinal adenopathy PET/CT 05/17/2022-hypermetabolic left upper lobe nodule, hypermetabolic left hilar and mediastinal lymph nodes, hypermetabolic left scapular lesion, hypermetabolic lesion in acetabulum 3/14 transferred to [facility] 3/15 received radiation therapy 3/17 A-fib with RVR, colonoscopy canceled  Interim History / Subjective:  Asked by Dr. Grandville Silos to see patient Chest x-ray showing large left pleural effusion  Objective   Blood pressure (!) 114/53, pulse 69,  temperature 99.1 F (37.3 C), temperature source Oral, resp. rate (!) 26, height 5\' 4"  (1.626 m), weight 65.4 kg, SpO2 94 %.        Intake/Output Summary (Last 24 hours) at 09/03/2022 1427 Last data filed at 09/03/2022 1030 Gross per 24 hour  Intake 1604.55 ml  Output 2150 ml  Net -545.45 ml    Filed Weights   08/31/22 0054 08/31/22 1600 09/02/22 0526  Weight: 66.3 kg 65.4 kg 65.4 kg    Examination: General: Chronically ill-appearing HEENT: Moist oral mucosa Neuro: Grossly intact, moving all extremities CV: Irregularly irregular pulses, S1-S2 appreciated PULM: Diminished breath sounds bilaterally GI: Bowel sounds appreciated Extremities: warm/dry, 2+ edema  Skin: no rashes or lesions    Resolved Hospital Problem list     Assessment & Plan:   Metastatic lung cancer Progressive disease, metastasis to multiple bones -Seen by oncology -Receiving palliative radiation treatments  Metastatic lung cancer -Bronchoscopy on 1/16 and revealed malignant cells in station 7 and 10-adenopathy  COPD exacerbation Recent admission for pneumonia, sepsis -Currently being treated for postobstructive pneumonia with antibiotics -Worsening chest x-ray findings showing large effusion -Will plan for thoracentesis  Acute on chronic hypoxemic respiratory failure O2 as needed  Atrial fibrillation Continue Eliquis  Sherrilyn Rist, MD [place] [facility] Pager: See Shea Evans

## 2022-09-03 NOTE — Progress Notes (Signed)
[facility] Gastroenterology Progress Note  Rhonda Blackwell 72 y.o. 02/26/1951  CC: Metastatic cancer, abnormal PET scan   Subjective: Patient seen and examined at bedside.  Recent events noted.  Patient developed A-fib with RVR as well as hypoxemia.  Also developed fever. MRI yesterday showed acute infarct.  ROS : Positive for shortness of breath, negative for vomiting   Objective: Vital signs in last 24 hours: Vitals:   09/03/22 0700 09/03/22 1013  BP: (!) 119/59 126/66  Pulse: 79 83  Resp: (!) 26   Temp: 99.2 F (37.3 C)   SpO2: 93%     Physical Exam: Elderly patient, not in acute distress.  Oxygen by nasal cannula.  Abdominal exam benign.  Lab Results: Recent Labs    09/01/22 0545 09/02/22 0524 09/03/22 0351  NA 136 131* 132*  K 3.6 3.3* 3.6  CL 94* 94* 94*  CO2 30 29 26   GLUCOSE 145* 141* 177*  BUN 10 14 13   CREATININE 0.57 0.67 0.79  CALCIUM 9.2 8.9 8.8*  MG 2.0  --  2.0   Recent Labs    09/02/22 0524  AST 13*  ALT 13  ALKPHOS 95  BILITOT 1.2  PROT 6.9  ALBUMIN 3.0*   Recent Labs    09/02/22 0524 09/03/22 0351  WBC 9.9 11.0*  NEUTROABS  --  9.4*  HGB 11.0* 11.1*  HCT 33.5* 35.3*  MCV 77.2* 77.6*  PLT 465* 456*   No results for input(s): "LABPROT", "INR" in the last 72 hours.    Assessment/Plan: - metastatic lung cancer with mets to bones and PET scan in November 2023 concerning for hypermetabolic activity in the rectum which could be physiological in nature because there was no corresponding CT scan finding. - atrial fibrillation with RVR.  Improving -Respiratory failure.  Improving. -Acute CVA   Recommendations ----------------------- -Patient was scheduled for flexible sigmoidoscopy today which was canceled by anesthesia team because of recent respiratory issues as well as A-fib with RVR. -Okay to resume anticoagulation from GI standpoint. -Dr. Adriana Mccallum will follow-up tomorrow to decide timing for flexible sigmoidoscopy.  Otis Brace  MD, [place] 09/03/2022, 10:21 AM  Contact #  [phone]

## 2022-09-03 NOTE — Procedures (Addendum)
Thoracentesis Procedure Note  Pre-operative Diagnosis: left pleural effusion  Post-operative Diagnosis: same  Indications: large left pleural effusion  Procedure Details   Consent: Informed consent was obtained. Risks of the procedure were discussed including: infection, bleeding, pain, pneumothorax.  Under sterile conditions the patient was positioned. Betadine solution and sterile drapes were utilized.  1% plain lidocaine was used to anesthetize the 7 rib space. Fluid was obtained without any difficulties and minimal blood loss.  A dressing was applied to the wound and wound care instructions were provided.   Findings 800 ml of dark serous pleural fluid was obtained. A sample was sent to Pathology for cytology, cell counts, as well as for infection analysis.  Complications:  None; patient tolerated the procedure well.        Condition: stable  Plan A follow up chest x-ray was ordered. Bed Rest for 0 hours. Tylenol 650 mg. for pain.  Attending Attestation: I performed the procedure.   Pleural send fluid sent for analysis including cytology-large bag

## 2022-09-03 NOTE — Progress Notes (Signed)
eLink Physician-Brief Progress Note Patient Name: Beronica Michaelides DOB: 1951/05/26 MRN: [id number]   Date of Service  09/03/2022  HPI/Events of Note  72/F with COPD, DM, metastatic malignancy of unclear primary origin, admitted 3/12 for hypoxemic respiratory failure, attributed to obstructive pneumonia. Earlier today, she underwent diagnostic/therapeutic thoracentesis of a large L pleural effusion. Post procedure CXR shows clearing of the L hemithorax. This evening, RRT was called as pt was tachycardic to the 150s, with associated hypoxemia. She was started on cardizem, O2 per nasal cannula was increased, and she was transferred to the ICU.    eICU Interventions  1. Hypoxemic respiratory failure - Continue supplemental O2. Titrate to maintain SpO2 ~90% - Would repeat CXR to further evalaluate R hemithorax (?apical PTX vs. skin fold) - Continue antibiotics, bronchodilators, systemic steroids.   2. Afib RVR - Started on cardizem drip. Will titrate per protocol. Will monitor for hypotension - Started on heparin drip as well. Will monitor for bleed, worsening thrombocytopenia.         [place] 09/03/2022, 7:39 PM

## 2022-09-03 NOTE — Plan of Care (Signed)
  Problem: Education: Goal: Knowledge of disease or condition will improve Outcome: Progressing   Problem: Coping: Goal: Will identify appropriate support needs Outcome: Progressing   

## 2022-09-03 NOTE — Progress Notes (Signed)
Assisted with bedside thoracentesis. Prior to initiation of procedure, pt O2 saturation 84% on 5L [place]. Increased to 10L [place], see flowsheets. 800 mL amber fluid drained, samples delivered to lab.

## 2022-09-03 NOTE — Progress Notes (Signed)
   09/03/22 1511  Assess: MEWS Score  Pulse Rate (!) 107  ECG Heart Rate (!) 114  Resp (!) 29  SpO2 (!) 84 %  O2 Device HFNC  O2 Flow Rate (L/min) 10 L/min  Assess: MEWS Score  MEWS Temp 0  MEWS Systolic 0  MEWS Pulse 2  MEWS RR 2  MEWS LOC 0  MEWS Score 4  MEWS Score Color Red  Assess: if the MEWS score is Yellow or Red  Were vital signs taken at a resting state?  (VS during bedside thoracentesis)  MEWS guidelines implemented  No, previously red, continue vital signs every 4 hours  Assess: SIRS CRITERIA  SIRS Temperature  0  SIRS Pulse 1  SIRS Respirations  1  SIRS WBC 0  SIRS Score Sum  2

## 2022-09-03 NOTE — Progress Notes (Signed)
   09/03/22 1430  Provider Notification  Provider Name/Title Grandville Silos  Date Provider Notified 09/03/22  Time Provider Notified 1431  Method of Notification Page (and text page)  Notification Reason Other (Comment);Change in status (Patoient converted back to Afib within 30 mins of cardizem gtt being DC'd)  Provider response See new orders  Date of Provider Response 09/03/22  Time of Provider Response 1439

## 2022-09-03 NOTE — Progress Notes (Signed)
PT Cancellation Note  Patient Details Name: Rhonda Blackwell MRN: [id number] DOB: 26-Aug-1950   Cancelled Treatment:    Reason Eval/Treat Not Completed: Medical issues which prohibited therapy. Pt back in afib, going for thoracentesis this pm; with neurologist currently. PT will hold off for now, discussed with  RN who is in agreement   [facility] 09/03/2022, 2:34 PM

## 2022-09-03 NOTE — Progress Notes (Signed)
eLink Physician-Brief Progress Note Patient Name: Rhonda Blackwell DOB: [birth year] MRN: [id number]   Date of Service  09/03/2022  HPI/Events of Note  Repeat CXR reviewed. Peristennt L perihliar, LLL disease. No pneumothorax.   eICU Interventions  No intervention indicated at this time.  Will continue present management.        [place] 09/03/2022, 9:02 PM

## 2022-09-03 NOTE — Progress Notes (Signed)
   09/03/22 1755  Oxygen Therapy  O2 Device HFNC  O2 Flow Rate (L/min) 12 L/min  Provider Notification  Provider Name/Title Grandville Silos  Date Provider Notified 09/03/22  Time Provider Notified [id number]  Method of Notification Page  Notification Reason Change in status (Back in afib w/ HR up to 157, increased o2 demand)  Provider response See new orders;At bedside  Date of Provider Response 09/03/22  Time of Provider Response 1800  Rapid Response Notification  Name of Rapid Response RN Notified Chloe RN  Date Rapid Response Notified 09/03/22  Time Rapid Response Notified [id number] (per CN)

## 2022-09-03 NOTE — Progress Notes (Signed)
RT note: Pt. found on 12 lpm HFNC([place] device), RT added humidity per Protocol and weaned setting down to 8 lpm with Oxygen saturations @ 95-96%.

## 2022-09-03 NOTE — Progress Notes (Signed)
Pt's daughter on unit when I returned, Updated pt's condition and location

## 2022-09-04 ENCOUNTER — Encounter (HOSPITAL_COMMUNITY): Payer: Self-pay | Admitting: Family Medicine

## 2022-09-04 ENCOUNTER — Inpatient Hospital Stay (HOSPITAL_COMMUNITY)
Admit: 2022-09-04 | Discharge: 2022-09-04 | Disposition: A | Discharge status: Home / Self Care | Payer: Medicare Other | Attending: Hematology & Oncology | Admitting: Hematology & Oncology

## 2022-09-04 ENCOUNTER — Inpatient Hospital Stay: Admitting: Radiology

## 2022-09-04 ENCOUNTER — Ambulatory Visit
Admission: RE | Admit: 2022-09-04 | Discharge: 2022-09-04 | Disposition: A | Discharge status: Home / Self Care | Payer: Medicare Other | Source: Ambulatory Visit | Attending: Radiation Oncology | Admitting: Radiation Oncology

## 2022-09-04 ENCOUNTER — Encounter (HOSPITAL_COMMUNITY): Payer: Self-pay | Admitting: Hematology & Oncology

## 2022-09-04 ENCOUNTER — Other Ambulatory Visit: Payer: Self-pay

## 2022-09-04 DIAGNOSIS — E119 Type 2 diabetes mellitus without complications: Secondary | ICD-10-CM | POA: Diagnosis not present

## 2022-09-04 DIAGNOSIS — I639 Cerebral infarction, unspecified: Secondary | ICD-10-CM | POA: Diagnosis not present

## 2022-09-04 DIAGNOSIS — C3492 Malignant neoplasm of unspecified part of left bronchus or lung: Secondary | ICD-10-CM

## 2022-09-04 DIAGNOSIS — I10 Essential (primary) hypertension: Secondary | ICD-10-CM | POA: Diagnosis not present

## 2022-09-04 DIAGNOSIS — J9621 Acute and chronic respiratory failure with hypoxia: Secondary | ICD-10-CM | POA: Diagnosis not present

## 2022-09-04 HISTORY — PX: IR IMAGING GUIDED PORT INSERTION: IMG5740

## 2022-09-04 HISTORY — DX: Malignant neoplasm of unspecified part of left bronchus or lung: C34.92

## 2022-09-04 LAB — COMPREHENSIVE METABOLIC PANEL
ALT: 17 U/L (ref 0–44)
AST: 19 U/L (ref 15–41)
Albumin: 2.7 g/dL — ABNORMAL LOW (ref 3.5–5.0)
Alkaline Phosphatase: 103 U/L (ref 38–126)
Anion gap: 8 (ref 5–15)
BUN: 14 mg/dL (ref 8–23)
CO2: 25 mmol/L (ref 22–32)
Calcium: 7.5 mg/dL — ABNORMAL LOW (ref 8.9–10.3)
Chloride: 93 mmol/L — ABNORMAL LOW (ref 98–111)
Creatinine, Ser: 0.75 mg/dL (ref 0.44–1.00)
GFR, Estimated: >60 mL/min (ref >60)
Glucose, Bld: 131 mg/dL — ABNORMAL HIGH (ref 70–99)
Potassium: 3.5 mmol/L (ref 3.5–5.1)
Sodium: 126 mmol/L — ABNORMAL LOW (ref 135–145)
Total Bilirubin: 0.8 mg/dL (ref 0.3–1.2)
Total Protein: 6.3 g/dL — ABNORMAL LOW (ref 6.5–8.1)

## 2022-09-04 LAB — RAD ONC ARIA SESSION SUMMARY
Course Elapsed Days: 3
Plan Fractions Treated to Date: 1
Plan Prescribed Dose Per Fraction: 3 Gy
Plan Total Fractions Prescribed: 8
Plan Total Prescribed Dose: 24 Gy
Reference Point Dosage Given to Date: 3 Gy
Reference Point Session Dosage Given: 3 Gy
Session Number: 2

## 2022-09-04 LAB — CBC WITH DIFFERENTIAL/PLATELET
Abs Immature Granulocytes: 0.07 10*3/uL (ref 0.00–0.07)
Basophils Absolute: 0 10*3/uL (ref 0.0–0.1)
Basophils Relative: 0 %
Eosinophils Absolute: 0 10*3/uL (ref 0.0–0.5)
Eosinophils Relative: 0 %
HCT: 31.2 % — ABNORMAL LOW (ref 36.0–46.0)
Hemoglobin: 10.1 g/dL — ABNORMAL LOW (ref 12.0–15.0)
Immature Granulocytes: 1 %
Lymphocytes Relative: 6 %
Lymphs Abs: 0.7 10*3/uL (ref 0.7–4.0)
MCH: 25 pg — ABNORMAL LOW (ref 26.0–34.0)
MCHC: 32.4 g/dL (ref 30.0–36.0)
MCV: 77.2 fL — ABNORMAL LOW (ref 80.0–100.0)
Monocytes Absolute: 0.6 10*3/uL (ref 0.1–1.0)
Monocytes Relative: 5 %
Neutro Abs: 10.8 10*3/uL — ABNORMAL HIGH (ref 1.7–7.7)
Neutrophils Relative %: 88 %
Platelets: 408 10*3/uL — ABNORMAL HIGH (ref 150–400)
RBC: 4.04 MIL/uL (ref 3.87–5.11)
RDW: 14 % (ref 11.5–15.5)
WBC: 12.2 10*3/uL — ABNORMAL HIGH (ref 4.0–10.5)
nRBC: 0 % (ref 0.0–0.2)

## 2022-09-04 LAB — GLUCOSE, CAPILLARY
Glucose-Capillary: 155 mg/dL — ABNORMAL HIGH (ref 70–99)
Glucose-Capillary: 152 mg/dL — ABNORMAL HIGH (ref 70–99)
Glucose-Capillary: 139 mg/dL — ABNORMAL HIGH (ref 70–99)
Glucose-Capillary: 154 mg/dL — ABNORMAL HIGH (ref 70–99)

## 2022-09-04 LAB — PHOSPHORUS: Phosphorus: 3.2 mg/dL (ref 2.5–4.6)

## 2022-09-04 LAB — HEPARIN LEVEL (UNFRACTIONATED): Heparin Unfractionated: 0.89 IU/mL — ABNORMAL HIGH (ref 0.30–0.70)

## 2022-09-04 LAB — OSMOLALITY: Osmolality: 277 mOsm/kg (ref 275–295)

## 2022-09-04 LAB — APTT: aPTT: 59 seconds — ABNORMAL HIGH (ref 24–36)

## 2022-09-04 LAB — LACTATE DEHYDROGENASE: LDH: 250 U/L — ABNORMAL HIGH (ref 98–192)

## 2022-09-04 LAB — MAGNESIUM: Magnesium: 2 mg/dL (ref 1.7–2.4)

## 2022-09-04 MED ORDER — DILTIAZEM HCL 60 MG PO TABS
60.0000 mg | ORAL_TABLET | Freq: Three times a day (TID) | ORAL | Status: DC
  Administered 2022-09-04 – 2022-09-07 (×7): 60 mg via ORAL
  Filled 2022-09-04 (×9): qty 1

## 2022-09-04 MED ORDER — HEPARIN (PORCINE) 25000 UT/250ML-% IV SOLN: 800.0000 [IU]/h | INTRAVENOUS | Status: DC

## 2022-09-04 MED ORDER — SODIUM CHLORIDE 0.9 % IV SOLN: INTRAVENOUS | Status: DC

## 2022-09-04 MED ORDER — FENTANYL CITRATE (PF) 100 MCG/2ML IJ SOLN
INTRAMUSCULAR | Status: AC | PRN
  Administered 2022-09-04: 25 ug via INTRAVENOUS

## 2022-09-04 MED ORDER — MIDAZOLAM HCL 2 MG/2ML IJ SOLN
INTRAMUSCULAR | Status: AC
  Filled 2022-09-04: qty 2

## 2022-09-04 MED ORDER — LIDOCAINE HCL 1 % IJ SOLN
INTRAMUSCULAR | Status: AC
  Administered 2022-09-04: 10 mL via INTRADERMAL
  Filled 2022-09-04: qty 20

## 2022-09-04 MED ORDER — FENTANYL CITRATE (PF) 100 MCG/2ML IJ SOLN
INTRAMUSCULAR | Status: AC
  Filled 2022-09-04: qty 2

## 2022-09-04 MED ORDER — MIDAZOLAM HCL 2 MG/2ML IJ SOLN
INTRAMUSCULAR | Status: AC | PRN
  Administered 2022-09-04: .5 mg via INTRAVENOUS

## 2022-09-04 MED ORDER — LIDOCAINE-EPINEPHRINE 1 %-1:100000 IJ SOLN
INTRAMUSCULAR | Status: AC
  Administered 2022-09-04: 7 mL via INTRADERMAL
  Filled 2022-09-04: qty 1

## 2022-09-04 NOTE — Evaluation (Signed)
Physical Therapy Evaluation Patient Details Name: Rhonda Blackwell MRN: [id number] DOB: [birth year] Today's Date: 09/04/2022  History of Present Illness  72 y.o. female with medical history significant of COPD, Type 2 diabetes, CVA, hypertension atrial fibrillation on Eliquis, metastatic disease with unknown primary presented to the hospital with increasing shortness of breath.  Of note, patient was recently admitted between  07/23/22 to 07/27/22 with sepsis secondary to community-acquired multifocal pneumonia.  She was discharged on 2 L nasal cannula at rest and up to 3 L nasal cannula on ambulation. Dx of pleural effusion, hypokalemia, Small acute infarct left frontal white matter.  Clinical Impression  Pt admitted with above diagnosis. Mod assist for supine to sit, +2 mod assist sit to stand BLEs weak and mildly buckling for stand pivot transfer to recliner with RW.   Pt currently with functional limitations due to the deficits listed below (see PT Problem List). Pt will benefit from skilled PT to increase their independence and safety with mobility to allow discharge to the venue listed below.          Recommendations for follow up therapy are one component of a multi-disciplinary discharge planning process, led by the attending physician.  Recommendations may be updated based on patient status, additional functional criteria and insurance authorization.  Follow Up Recommendations Skilled nursing-short term rehab (<3 hours/day) Can patient physically be transported by private vehicle: No    Assistance Recommended at Discharge Frequent or constant Supervision/Assistance  Patient can return home with the following  Two people to help with walking and/or transfers;A lot of help with bathing/dressing/bathroom;Assistance with cooking/housework;Assist for transportation;Help with stairs or ramp for entrance    Equipment Recommendations None recommended by PT  Recommendations for Other Services        Functional Status Assessment Patient has had a recent decline in their functional status and demonstrates the ability to make significant improvements in function in a reasonable and predictable amount of time.     Precautions / Restrictions Precautions Precautions: Fall Precaution Comments: denies falls in past 6 months Restrictions Weight Bearing Restrictions: No      Mobility  Bed Mobility Overal bed mobility: Needs Assistance Bed Mobility: Supine to Sit     Supine to sit: Mod assist     General bed mobility comments: assist to raise trunk and pivot hips to edge of bed    Transfers Overall transfer level: Needs assistance Equipment used: Rolling walker (2 wheels) Transfers: Sit to/from Stand, Bed to chair/wheelchair/BSC Sit to Stand: Mod assist, +2 physical assistance   Step pivot transfers: +2 physical assistance, Min assist       General transfer comment: assist to power up, steadying assist to pivot, BLEs weak with mild buckling, HR 80 max, SpO2 92% on 7 L with activity    Ambulation/Gait               General Gait Details: NT 2* fatigue with pivot to recliner  Stairs            Wheelchair Mobility    Modified Rankin (Stroke Patients Only)       Balance Overall balance assessment: Needs assistance Sitting-balance support: Feet supported, Single extremity supported Sitting balance-Leahy Scale: Good     Standing balance support: Bilateral upper extremity supported, During functional activity, Reliant on assistive device for balance Standing balance-Leahy Scale: Poor  Pertinent Vitals/Pain Pain Assessment Pain Assessment: No/denies pain    Home Living Family/patient expects to be discharged to:: Private residence Living Arrangements: Spouse/significant other Available Help at Discharge: Family;Available 24 hours/day Type of Home: House         Home Layout: One level Home Equipment:  Advice worker (2 wheels);Cane - single point;Rollator (4 wheels);Wheelchair - manual      Prior Function Prior Level of Function : Needs assist             Mobility Comments: denies falls in past 6 months, uses SPC ADLs Comments: Reports husband assisted with dressing occasionally, mostly socks and coat. Able to get in/out of bath tub, uses shower chair.     Hand Dominance   Dominant Hand: Right    Extremity/Trunk Assessment   Upper Extremity Assessment Upper Extremity Assessment: Defer to OT evaluation    Lower Extremity Assessment Lower Extremity Assessment: Generalized weakness;RLE deficits/detail;LLE deficits/detail RLE Deficits / Details: knee ext -4/5 RLE Sensation: WNL LLE Deficits / Details: knee ext -4/5 LLE Sensation: WNL    Cervical / Trunk Assessment Cervical / Trunk Assessment: Normal  Communication   Communication: No difficulties  Cognition Arousal/Alertness: Awake/alert Behavior During Therapy: WFL for tasks assessed/performed Overall Cognitive Status: Within Functional Limits for tasks assessed                                          General Comments      Exercises     Assessment/Plan    PT Assessment Patient needs continued PT services  PT Problem List Decreased strength;Decreased balance;Decreased mobility;Decreased activity tolerance;Cardiopulmonary status limiting activity       PT Treatment Interventions Therapeutic activities;Gait training;Functional mobility training;Patient/family education;DME instruction;Therapeutic exercise    PT Goals (Current goals can be found in the Care Plan section)  Acute Rehab PT Goals Patient Stated Goal: to get strong enough to go home PT Goal Formulation: With patient Time For Goal Achievement: 09/18/22 Potential to Achieve Goals: Fair    Frequency Min 2X/week     Co-evaluation               AM-PAC PT "6 Clicks" Mobility  Outcome Measure Help needed  turning from your back to your side while in a flat bed without using bedrails?: A Little Help needed moving from lying on your back to sitting on the side of a flat bed without using bedrails?: A Lot Help needed moving to and from a bed to a chair (including a wheelchair)?: Total Help needed standing up from a chair using your arms (e.g., wheelchair or bedside chair)?: A Lot Help needed to walk in hospital room?: Total Help needed climbing 3-5 steps with a railing? : Total 6 Click Score: 10    End of Session Equipment Utilized During Treatment: Gait belt;Oxygen Activity Tolerance: Patient limited by fatigue Patient left: in chair;with chair alarm set;with call bell/phone within reach;with nursing/sitter in room Nurse Communication: Mobility status PT Visit Diagnosis: Difficulty in walking, not elsewhere classified (R26.2)    Time: [id number] PT Time Calculation (min) (ACUTE ONLY): 23 min   Charges:   PT Evaluation $PT Eval Moderate Complexity: 1 Mod PT Treatments $Therapeutic Activity: 8-22 mins        Blondell Reveal Kistler PT 09/04/2022  Acute Rehabilitation Services  Office [phone]

## 2022-09-04 NOTE — Progress Notes (Signed)
PROGRESS NOTE    Rhonda Blackwell  [id number] DOB: 1950-12-19 DOA: 08/29/2022 PCP: [name], MD    Chief Complaint  Patient presents with   Tachycardia    Brief Narrative:  Rhonda Blackwell is a 72 y.o. female with medical history significant of COPD, Type 2 diabetes, CVA, hypertension atrial fibrillation on Eliquis, metastatic disease with unknown primary presented to the hospital with increasing shortness of breath.  Of note, patient was recently admitted between  07/23/22 to 07/27/22 with sepsis secondary to community-acquired multifocal pneumonia.  She was discharged on 2 L nasal cannula at rest and up to 3 L nasal cannula on ambulation.  Patient was also recently diagnosed atrial fibrillation and was on Eliquis.  Ever since her discharge, patient was feeling unwell and had persistent shortness of breath and had ran out of her Eliquis after 30 days.  History of hypermetabolic left upper lung nodule as well as hypermetabolic hilar and mediastinal lymph nodes, left scapular lesion, area of right acetabulum and rectum, concerning for metastatic disease on PET scan.  Patient was followed by [facility]monology/oncology, underwent CT-guided percutaneous biopsy of left scapular lytic bone lesion on 06/08/2022 and bronchoscopy with EBUS on 07/04/2022.  Pt then did not follow up with oncology on multiple appointments.    In the ED, on this presentation, she was afebrile and normotensive.  Initially noted to be in atrial fibrillation with RVR but this  resolved spontaneously.  She was initially was placed on 2 L, however continued have significant oxygen desaturation was ultimately placed on 8 L high flow nasal cannula.  Mild leukocytosis at 12.5.  Troponin was 30. Negative flu/COVID/RSV. CT chest was obtained which showed masslike consolidation within the left upper lobe abutting the left hilar concerning for central obstructing neoplasm.  There is peripheral areas of consolidation and interlobar  septal thickening throughout the left upper lobe concerning for combination of postobstructive changes, regional metastatic disease and lymphangitic spread of tumor.  Mediastinal and hilar adenopathy consistent with nodule metastasis. 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 EDP consulted pulmonology and patient was admitted to the hospital for further evaluation and treatment.   Assessment & Plan:   Principal Problem:   Acute on chronic respiratory failure with hypoxia (HCC) Active Problems:   Type 2 diabetes mellitus without complications (HCC)   Essential hypertension   Paroxysmal atrial fibrillation with RVR (HCC)   History of CVA (cerebrovascular accident)   Chronic anxiety   Tobacco use   Malignant neoplasm of hilus of left lung (HCC)   Lung mass   Acute CVA (cerebrovascular accident) ([place])   Pleural effusion on left  #1 acute on chronic respiratory failure with hypoxia/lung mass/large left pleural effusion -Acute on chronic respiratory failure likely multifactorial secondary to lung mass, postobstructive pneumonia in the setting of large left pleural effusion. -Patient noted to have been on 2 L of O2 at baseline by needed up to 15 L of O2 initially on presentation, O2 requirements initially improving however patient went into A-fib RVR with increased O2 requirements, chest x-ray concerning for large left pleural effusion. -Patient seen by PCCM, underwent thoracentesis 09/03/2022 with 800 cc of fluid removed and subsequently the evening of 09/03/2022 noted to have increased O2 requirements requiring up as high as 12 L high flow nasal cannula and subsequently transferred to the stepdown unit.  Currently on 8 L high flow nasal cannula. -Patient initially noted to have been discharged from oncology office  due to lack of follow-up for scheduled appointments. -CT chest during this hospitalization showed masslike consolidation within the  left upper lobe abutting the left hilar concerning for central obstructing neoplasm.  Peripheral areas of consolidation interlobar septal thickening throughout the left upper lobe concerning for combination of postobstructive changes, regional metastatic disease and lymphangitic spread of tumor. -Patient currently on IV Zosyn due to concern for postobstructive pneumonia. -Patient's presenting symptoms of respiratory distress likely secondary to mechanical obstruction with postobstructive pneumonia. -Patient seen by PCCM and recommended consultation with oncology and radiation oncology early on in the hospitalization. -Patient subsequently transferred from [facility] to [facility] long hospital. -Oncology is currently following and concern for adenocarcinoma of the lung, oncology also concern about findings of PET scan with hypermetabolic activity in the rectum and the presence of elevated CEA and recommending GI evaluation.. -Patient seen by radiation oncology and underwent simulation and first treatment 09/01/2022. -Radiation oncology recommended to continue first few treatments of radiation while in-house and if and when patient better and stable could complete a 2-week course in the outpatient setting. -Due to large left pleural effusion patient seen by PCCM underwent thoracentesis as stated above with 800 cc of fluid removed. -Patient seen by oncology this morning recommending Port-A-Cath placement F2 patient may need to be started on low-dose chemotherapy to see if this can improve the response so her lung can open up. -IR consulted for Port-A-Cath placement. -Appreciate oncology and radiation oncology input and recommendations.  2.  Hypokalemia -Repleted.   -Potassium at 3.5  3.  Small acute infarct left frontal white matter/prior history of CVA -MRI done 09/02/2022 for staging with small acute infarct in the left frontal white matter.  Chronic small vessel ischemia with multiple chronic lacunar  infarcts.  Multiple calvarial metastases, largest measuring 2 cm in the right parietal bone with transcortical growth.  Negative for brain metastases. -Patient noted out to have been on Eliquis throughout the hospitalization and subsequently transition to heparin as patient noted to be scheduled for flexible sigmoidoscopy to be done 09/03/2022 however canceled as patient noted to go into A-fib with RVR.  -Stroke workup underway. -CT angiogram head and neck done with no LVO.  Multiple,.  Metastases largest located in the right parietal bone with transcortical extension, masslike consolidation in the left upper lobe as characterized on recent chest CT, bilateral pleural effusions. -Patient with recent 2D echo and as such 2D echo not obtained.  -Patient noted to have passed bedside swallow evaluation.  -Fasting lipid panel with an LDL of 51, total cholesterol 114. -SLP, PT, OT. -Hemoglobin A1c noted at 6.3 on 07/24/2022. -Continue statin. -Currently on heparin drip. -Neurology consulted and recommended continuation of heparin drip and may switch back to Eliquis versus Lovenox when no further procedures are planned. -Choice of Eliquis versus Lovenox will be deferred to hematology/oncology. -Appreciate neurology input and recommendations.  4 large exudative left pleural effusion -Noted on chest x-ray. -Patient admitted with acute on chronic respiratory failure with concerns for right upper lobe mass and concern for postobstructive pneumonia. -Patient seen in consultation by PCCM, patient underwent thoracentesis with 800 cc of dark serous pleural fluid obtained, cultures with no growth to date so far.  By lights criteria likely exudate.  Cytology pending. -PCCM following and feels likely a malignant pleural effusion recommending intermittent checks x-rays and if reaccumulation occurs we will probably try pigtail with tach pleurocentesis rather than Pleurx to prevent old outpatient follow-up needed.  Hope  is that radiation helps with  this. -PCCM following and appreciate input and recommendations.  5.  Hypomagnesemia -Repleted. -Magnesium at 2.0 from 1.6. -Continue oral magnesium supplementation. -Repeat labs in the AM.  6.  Hypertension -BP controlled on Cardizem drip, Lasix, Avapro, labetalol.  7.  Tobacco use -Tobacco cessation already done per prior MD.  8.  Type 2 diabetes mellitus without complications -Hemoglobin A1c noted at 6.3 on 07/24/2022. -CBG 155 this morning.   -SSI.    9.  Paroxysmal atrial fibrillation with RVR -Patient noted to have been on Cardizem and labetalol for rate control.   -Was on Eliquis and subsequently transitioned to heparin in anticipation of possible flexible sigmoidoscopy.   -Patient noted to go into A-fib with RVR the evening of 09/02/2022, with some associated shortness of breath and was on Cardizem drip.   -Patient noted to have converted to normal sinus rhythm at 3 AM 09/03/2022, was initially transitioned off Cardizem drip however went back into A-fib with RVR with heart rate sustained in the 150s and had to be placed back on the Cardizem drip.   -Continue home regimen labetalol.   -Heparin resumed as flex sigmoidoscopy has been canceled.   -Continue to try to wean Cardizem drip and could potentially transition to oral Cardizem 60 mg every 6 hours in the next 24 hours if heart rate remains controlled.  10.  Chronic anxiety -Cymbalta  11.  Abnormal PET scan -Patient noted to have an abnormal PET scan November 2023 that showed hypermetabolic activity in the rectum without any discrete CT correlation possibly reflecting physiologic activity. -Oncology recommending GI evaluation. -CEA elevated at 1408 -Patient seen in consultation by GI 09/02/2022 who are recommending a flexible sigmoidoscopy which was to be done today for further evaluation of abnormal finding noted on PET scan concerning for metabolic Activity in the rectum, however due to patient's  A-fib the evening of 09/02/2022 and on Cardizem drip, anesthesia recommended procedure be rescheduled..  -Per GI.  12.  Hyponatremia -Sodium levels this morning at 126. -Possibly likely a component of SIADH due to concern for obstructive lung cancer versus hypovolemic hyponatremia. -Check a serum osmolality, urine osmolality. -Check a urine sodium, urine creatinine. -TSH noted at 1.374 on 07/27/2022. -Place on gentle hydration.   DVT prophylaxis: Heparin drip. Code Status: Full Family Communication: Updated patient.  Updated grandsons at bedside.  Disposition: TBD  Status is: Inpatient Remains inpatient appropriate because: Severity of illness   Consultants:  Hematology/oncology: Dr. Myna Hidalgo 08/31/2022 Gastroenterology: Dr. Levora Angel 09/02/2022 Radiation oncology: Dr. Kathrynn Running 08/31/2022 PCCM: Dr.Olalere 08/29/2022 Neurology: Dr.Khaliqdina 09/03/2022 Interventional radiology  Procedures:  CT chest 08/29/2022 Chest x-ray 08/29/2022, 09/03/2022 MRI brain 09/02/2022 Bone scan 09/01/2022 Thoracentesis per PCCM, Dr.Olalere 09/03/2022  Antimicrobials:  Anti-infectives (From admission, onward)    Start     Dose/Rate Route Frequency Ordered Stop   08/31/22 2200  piperacillin-tazobactam (ZOSYN) IVPB 3.375 g        3.375 g 12.5 mL/hr over 240 Minutes Intravenous Every 8 hours 08/31/22 1840     08/29/22 2315  piperacillin-tazobactam (ZOSYN) IVPB 3.375 g  Status:  Discontinued        3.375 g 12.5 mL/hr over 240 Minutes Intravenous Every 8 hours 08/29/22 2134 08/31/22 1540         Subjective: Laying in bed.  Currently on Cardizem drip heart rate better controlled in the 80s to 90s.  Denies any palpitations today.  Feels breathing somewhat improved from yesterday after thoracentesis.  Denies any chest pain.  Grandsons at bedside.    Objective:  Vitals:   09/04/22 0400 09/04/22 0500 09/04/22 0600 09/04/22 0715  BP: 118/64 (!) 117/51 (!) 124/54   Pulse: 85 81 77   Resp: (!) 24 (!) 24 (!)  25   Temp:    98.5 F (36.9 C)  TempSrc:    Axillary  SpO2: 93% 93% 95%   Weight:   66.5 kg   Height:        Intake/Output Summary (Last 24 hours) at 09/04/2022 0948 Last data filed at 09/04/2022 0611 Gross per 24 hour  Intake 1001.98 ml  Output 900 ml  Net 101.98 ml   Filed Weights   09/02/22 0526 09/03/22 1900 09/04/22 0600  Weight: 65.4 kg 65.4 kg 66.5 kg    Examination:  General exam: NAD.  On 8 L high flow nasal cannula. Respiratory system: Some decreased breath sounds in the left base.  No wheezing.  Fair air movement.  Speaking in full sentences.  On 8 L high flow nasal cannula with sats of 93%.  Cardiovascular system: Irregularly irregular.  No JVD.  No murmurs rubs or gallops.  No lower extremity edema.   Gastrointestinal system: Abdomen is soft, nontender, nondistended, positive bowel sounds.  No rebound.  No guarding.  Central nervous system: Alert and oriented. No focal neurological deficits. Extremities: Symmetric 5 x 5 power. Skin: No rashes, lesions or ulcers Psychiatry: Judgement and insight appear normal. Mood & affect appropriate.     Data Reviewed: I have personally reviewed following labs and imaging studies  CBC: Recent Labs  Lab 08/29/22 1400 08/30/22 0337 08/31/22 0030 09/01/22 0545 09/02/22 0524 09/03/22 0351 09/04/22 0614  WBC 12.5*   < > 11.8* 10.7* 9.9 11.0* 12.2*  NEUTROABS 9.9*  --   --   --   --  9.4* 10.8*  HGB 12.2   < > 10.7* 10.8* 11.0* 11.1* 10.1*  HCT 38.2   < > 32.7* 34.0* 33.5* 35.3* 31.2*  MCV 79.4*   < > 76.9* 78.2* 77.2* 77.6* 77.2*  PLT 575*   < > 409* 426* 465* 456* 408*   < > = values in this interval not displayed.    Basic Metabolic Panel: Recent Labs  Lab 08/31/22 0030 09/01/22 0545 09/02/22 0524 09/03/22 0351 09/04/22 0614  NA 135 136 131* 132* 126*  K 4.0 3.6 3.3* 3.6 3.5  CL 100 94* 94* 94* 93*  CO2 26 30 29 26 25   GLUCOSE 129* 145* 141* 177* 131*  BUN 6* 10 14 13 14   CREATININE 0.62 0.57 0.67 0.79  0.75  CALCIUM 9.1 9.2 8.9 8.8* 7.5*  MG 1.6* 2.0  --  2.0 2.0  PHOS  --   --   --   --  3.2    GFR: Estimated Creatinine Clearance: 60.5 mL/min (by C-G formula based on SCr of 0.75 mg/dL).  Liver Function Tests: Recent Labs  Lab 08/29/22 1400 09/02/22 0524 09/04/22 0614  AST 18 13* 19  ALT 12 13 17   ALKPHOS 97 95 103  BILITOT 0.7 1.2 0.8  PROT 7.1 6.9 6.3*  ALBUMIN 3.2* 3.0* 2.7*    CBG: Recent Labs  Lab 09/03/22 0735 09/03/22 1205 09/03/22 1642 09/03/22 2114 09/04/22 0736  GLUCAP 155* 284* 115* 176* 155*     Recent Results (from the past 240 hour(s))  Resp panel by RT-PCR (RSV, Flu A&B, Covid) Anterior Nasal Swab     Status: None   Collection Time: 08/29/22  2:07 PM   Specimen: Anterior Nasal Swab  Result Value Ref  Range Status   SARS Coronavirus 2 by RT PCR NEGATIVE NEGATIVE Final   Influenza A by PCR NEGATIVE NEGATIVE Final   Influenza B by PCR NEGATIVE NEGATIVE Final    Comment: (NOTE) The Xpert Xpress SARS-CoV-2/FLU/RSV plus assay is intended as an aid in the diagnosis of influenza from Nasopharyngeal swab specimens and should not be used as a sole basis for treatment. Nasal washings and aspirates are unacceptable for Xpert Xpress SARS-CoV-2/FLU/RSV testing.  Fact Sheet for Patients: EntrepreneurPulse.com.au  Fact Sheet for Healthcare Providers: IncredibleEmployment.be  This test is not yet approved or cleared by the [place] FDA and has been authorized for detection and/or diagnosis of SARS-CoV-2 by FDA under an Emergency Use Authorization (EUA). This EUA will remain in effect (meaning this test can be used) for the duration of the COVID-19 declaration under Section 564(b)(1) of the Act, 21 U.S.C. section 360bbb-3(b)(1), unless the authorization is terminated or revoked.     Resp Syncytial Virus by PCR NEGATIVE NEGATIVE Final    Comment: (NOTE) Fact Sheet for  Patients: EntrepreneurPulse.com.au  Fact Sheet for Healthcare Providers: IncredibleEmployment.be  This test is not yet approved or cleared by the [place] FDA and has been authorized for detection and/or diagnosis of SARS-CoV-2 by FDA under an Emergency Use Authorization (EUA). This EUA will remain in effect (meaning this test can be used) for the duration of the COVID-19 declaration under Section 564(b)(1) of the Act, 21 U.S.C. section 360bbb-3(b)(1), unless the authorization is terminated or revoked.  Performed at [facility], [place] [address] [place], [place] [address]   Expectorated Sputum Assessment w Gram Stain, Rflx to Resp Cult     Status: None (Preliminary result)   Collection Time: 08/29/22  9:22 PM   Specimen: Expectorated Sputum  Result Value Ref Range Status   Specimen Description EXPECTORATED SPUTUM  Final   Special Requests NONE  Final   Sputum evaluation   Final    THIS SPECIMEN IS ACCEPTABLE FOR SPUTUM CULTURE Performed at [facility], [place] [address] [place], [place] [address]    Report Status PENDING  Incomplete  Culture, Respiratory w Gram Stain     Status: None   Collection Time: 08/29/22  9:22 PM  Result Value Ref Range Status   Specimen Description EXPECTORATED SPUTUM  Final   Special Requests NONE Reflexed from [id number]  Final   Gram Stain   Final    RARE WBC PRESENT, PREDOMINANTLY MONONUCLEAR NO ORGANISMS SEEN    Culture   Final    RARE Normal respiratory flora-no Staph aureus or Pseudomonas seen Performed at [facility], [place] [address] [place], [place] [address]    Report Status 09/03/2022 FINAL  Final  Body fluid culture w Gram Stain     Status: None (Preliminary result)   Collection Time: 09/03/22  3:03 PM   Specimen: Pleural Fluid  Result Value Ref Range Status   Specimen Description   Final    PLEURAL Performed at [place] [address], [place],  [place] [address]    Special Requests   Final    NONE Performed at [facility], [place] [address], [place], [place] [address]    Gram Stain   Final    RARE WBC PRESENT, PREDOMINANTLY PMN NO ORGANISMS SEEN    Culture   Final    NO GROWTH < 24 HOURS Performed at [facility] [address] [place], [place] [address]  Report Status PENDING  Incomplete  MRSA Next Gen by PCR, Nasal     Status: None   Collection Time: 09/03/22  7:39 PM   Specimen: Nasal Mucosa; Nasal Swab  Result Value Ref Range Status   MRSA by PCR Next Gen NOT DETECTED NOT DETECTED Final    Comment: (NOTE) The GeneXpert MRSA Assay (FDA approved for NASAL specimens only), is one component of a comprehensive MRSA colonization surveillance program. It is not intended to diagnose MRSA infection nor to guide or monitor treatment for MRSA infections. Test performance is not FDA approved in patients less than 71 years old. Performed at [facility], [place] [address] [place], [place] [address]          Radiology Studies: DG CHEST PORT 1 VIEW  Result Date: 09/03/2022 CLINICAL DATA:  Post thoracentesis EXAM: PORTABLE CHEST 1 VIEW COMPARISON:  09/03/2022 FINDINGS: Decreasing left effusion following thoracentesis. Airspace disease in the left upper lobe and left lower lobe. Heart is borderline in size. Aortic atherosclerosis. Prominent hila bilaterally shown on prior CT to reflect dilated pulmonary arteries compatible with pulmonary arterial hypertension. No pneumothorax. IMPRESSION: Decreasing left effusion following thoracentesis.  No pneumothorax. Left upper lobe and left lower lobe airspace disease concerning for pneumonia. Electronically Signed   By: Rolm Baptise M.D.   On: 09/03/2022 20:08   DG CHEST PORT 1 VIEW  Result Date: 09/03/2022 CLINICAL DATA:  Shortness of breath EXAM: PORTABLE CHEST 1 VIEW COMPARISON:  09/03/2022 FINDINGS: Heart upper limits normal in size. Aortic  atherosclerosis. Bilateral hilar fullness shown on prior CT to reflect dilated pulmonary arteries compatible with pulmonary arterial hypertension. Left perihilar and lower lobe airspace disease, similar to earlier study. No confluent opacity on the right. No visible effusions or acute bony abnormality. IMPRESSION: Left perihilar and lower lobe airspace disease, similar prior study. Borderline heart size. Dilated pulmonary arteries compatible with pulmonary arterial hypertension. No significant change. Electronically Signed   By: Rolm Baptise M.D.   On: 09/03/2022 20:07   DG CHEST PORT 1 VIEW  Result Date: 09/03/2022 CLINICAL DATA:  Shortness of breath EXAM: PORTABLE CHEST 1 VIEW COMPARISON:  08/29/2022 FINDINGS: Cardiac shadow is stable but obscured by enlarging left-sided pleural effusion and left opacity. Right lung remains clear. No bony abnormality is noted. IMPRESSION: Significant increased opacity in the left hemithorax consistent with new pleural effusion and likely increasing consolidation. These results will be called to the ordering clinician or representative by the Radiologist Assistant, and communication documented in the PACS or [employer]. Electronically Signed   By: Inez Catalina M.D.   On: 09/03/2022 12:00   CT ANGIO HEAD NECK W WO CM  Result Date: 09/02/2022 CLINICAL DATA:  Acute neurologic deficit EXAM: CT ANGIOGRAPHY HEAD AND NECK TECHNIQUE: Multidetector CT imaging of the head and neck was performed using the standard protocol during bolus administration of intravenous contrast. Multiplanar CT image reconstructions and MIPs were obtained to evaluate the vascular anatomy. Carotid stenosis measurements (when applicable) are obtained utilizing NASCET criteria, using the distal internal carotid diameter as the denominator. RADIATION DOSE REDUCTION: This exam was performed according to the departmental dose-optimization program which includes automated exposure control, adjustment of the  mA and/or kV according to patient size and/or use of iterative reconstruction technique. CONTRAST:  31mL OMNIPAQUE IOHEXOL 350 MG/ML SOLN COMPARISON:  None Available. FINDINGS: CT HEAD FINDINGS Brain: There is no mass, hemorrhage or extra-axial collection. The size and configuration of the ventricles and extra-axial CSF spaces are normal.  Multiple old infarcts of the deep gray nuclei. There is hypoattenuation of the periventricular white matter, most commonly indicating chronic ischemic microangiopathy. Incidentally noted partially empty sella. Skull: Multiple lucent lesions of the calvarium, largest located in the right parietal bone with transcortical extension. Sinuses/Orbits: No fluid levels or advanced mucosal thickening of the visualized paranasal sinuses. No mastoid or middle ear effusion. The orbits are normal. CTA NECK FINDINGS SKELETON: There is no bony spinal canal stenosis. No lytic or blastic lesion. OTHER NECK: Normal pharynx, larynx and major salivary glands. No cervical lymphadenopathy. Unremarkable thyroid gland. UPPER CHEST: Consolidative opacity in the left upper lobe. Bilateral pleural effusions. AORTIC ARCH: There is calcific atherosclerosis of the aortic arch. There is no aneurysm, dissection or hemodynamically significant stenosis of the visualized portion of the aorta. Conventional 3 vessel aortic branching pattern. The visualized proximal subclavian arteries are widely patent. RIGHT CAROTID SYSTEM: No dissection, occlusion or aneurysm. Mild atherosclerotic calcification at the carotid bifurcation without hemodynamically significant stenosis. LEFT CAROTID SYSTEM: No dissection, occlusion or aneurysm. Mild atherosclerotic calcification at the carotid bifurcation without hemodynamically significant stenosis. VERTEBRAL ARTERIES: Codominant configuration. Both origins are clearly patent. There is no dissection, occlusion or flow-limiting stenosis to the skull base (V1-V3 segments). CTA HEAD  FINDINGS POSTERIOR CIRCULATION: --Vertebral arteries: Normal V4 segments. --Inferior cerebellar arteries: Normal. --Basilar artery: Normal. --Superior cerebellar arteries: Normal. --Posterior cerebral arteries (PCA): Normal. ANTERIOR CIRCULATION: --Intracranial internal carotid arteries: Normal. --Anterior cerebral arteries (ACA): Normal. Both A1 segments are present. Patent anterior communicating artery (a-comm). --Middle cerebral arteries (MCA): Normal. VENOUS SINUSES: As permitted by contrast timing, patent. ANATOMIC VARIANTS: None Review of the MIP images confirms the above findings. IMPRESSION: 1. No emergent large vessel occlusion or hemodynamically significant stenosis of the head or neck. 2. 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 3. Masslike consolidation in the left upper lobe, as characterized on recent chest CT. 4. Bilateral pleural effusions. Aortic atherosclerosis (ICD10-I70.0). Electronically Signed   By: Ulyses Jarred M.D.   On: 09/02/2022 23:17   MR BRAIN W WO CONTRAST  Result Date: 09/02/2022 CLINICAL DATA:  Non-small cell lung cancer staging EXAM: MRI HEAD WITHOUT AND WITH CONTRAST TECHNIQUE: Multiplanar, multiecho pulse sequences of the brain and surrounding structures were obtained without and with intravenous contrast. CONTRAST:  6.70mL GADAVIST GADOBUTROL 1 MMOL/ML IV SOLN COMPARISON:  None Available. FINDINGS: Brain: Focus of restricted diffusion in the high left frontal white matter is nonenhancing and elongated, consistent with lacunar infarct. Multiple lacunar infarcts are seen in the deep cerebral white matter with adjacent chronic ischemic gliosis. Chronic left basal ganglia perforator infarct. Generalized cerebral volume loss. No enhancing metastatic disease to the brain. Vascular: Major flow voids and vascular enhancements are preserved Skull and upper cervical spine: Multiple bone lesions, most notably a 2 cm deposit  in the posterior right parietal bone extending through both inner and outer table. 15 mm lesion at the left paramedian vertex reaches the inner table as do some smaller right parietal bone metastases. Sinuses/Orbits: Negative IMPRESSION: 1. Small acute infarct in the left frontal white matter. Chronic small vessel ischemia with multiple chronic lacunar infarcts. 2. 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 3. Negative for brain metastasis. Electronically Signed   By: Jorje Guild M.D.   On: 09/02/2022 12:38        Scheduled Meds:  atorvastatin  40 mg Oral Daily   Chlorhexidine Gluconate Cloth  6 each  Topical Daily   dronabinol  2.5 mg Oral BID AC   DULoxetine  60 mg Oral BID   fluticasone  2 spray Each Nare Daily   furosemide  20 mg Oral Daily   insulin aspart  0-6 Units Subcutaneous TID WC   irbesartan  150 mg Oral BID   labetalol  100 mg Oral BID   magnesium oxide  400 mg Oral BID   mometasone-formoterol  2 puff Inhalation BID   pantoprazole  40 mg Oral Daily   Continuous Infusions:  sodium chloride     diltiazem (CARDIZEM) infusion 10 mg/hr (09/04/22 [id number])   ferric gluconate (FERRLECIT) IVPB 250 mg (09/03/22 1013)   heparin     piperacillin-tazobactam 12.5 mL/hr at 09/04/22 0611     LOS: 6 days    Time spent: 50 minutes    Irine Seal, MD [facility]   To contact the attending provider between 7A-7P or the covering provider during after hours 7P-7A, please log into the web site [email or web address] and access using universal [place] password for that web site. If you do not have the password, please call the hospital operator.  09/04/2022, 9:48 AM

## 2022-09-04 NOTE — Progress Notes (Addendum)
ANTICOAGULATION CONSULT NOTE - Follow Up Consult  Pharmacy Consult for Heparin Indication: atrial fibrillation (apixaban on hold)  Allergies  Allergen Reactions   Bystolic [Nebivolol Hcl] Nausea Only and Other (See Comments)    Abdominal pain   Hydrochlorothiazide Nausea Only and Swelling    Leg swelling   Norvasc [Amlodipine] Swelling    Leg swelling    Patient Measurements: Height: 5\' 4"  (162.6 cm) Weight: 66.5 kg (146 lb 9.7 oz) IBW/kg (Calculated) : 54.7 Heparin Dosing Weight: TBW  Vital Signs: Temp: 97.8 F (36.6 C) (03/18 0335) Temp Source: Axillary (03/18 0335) BP: 124/54 (03/18 0600) Pulse Rate: 77 (03/18 0600)  Labs: Recent Labs    09/02/22 0524 09/02/22 2248 09/03/22 0351 09/03/22 1804 09/03/22 1929 09/04/22 0614  HGB 11.0*  --  11.1*  --   --   --   HCT 33.5*  --  35.3*  --   --   --   PLT 465*  --  456*  --   --   --   APTT  --  55*  --  131* 105*  --   HEPARINUNFRC  --   --  >1.10*  --   --  0.89*  CREATININE 0.67  --  0.79  --   --   --     Estimated Creatinine Clearance: 60.5 mL/min (by C-G formula based on SCr of 0.79 mg/dL).   Medications:  Infusions:   sodium chloride     diltiazem (CARDIZEM) infusion 10 mg/hr (09/04/22 [id number])   ferric gluconate (FERRLECIT) IVPB 250 mg (09/03/22 1013)   heparin 750 Units/hr (09/04/22 [id number])   piperacillin-tazobactam 12.5 mL/hr at 09/04/22 [id number]    Assessment: 31 yoF admitted on 3/12 with history of afib on Eliquis, which was continued through 3/15 2100, then held in anticipation of port placement. 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  GI was planning flexible sigmoidoscopy 3/17 to investigate abnormal PET scan findings, but was delayed d/t clinical instability.  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  Pharmacy consulted for heparin management by Neurology for atrial fibrillation - neuro scale.   Significant Events: 3/17: heparin drip stopped  at 0400 for planned flex sig; procedure cancelled due to change in cardiac and respiratory status. Heparin drip restarted at 0930   Today, 09/04/2022:  aPTT 59, sub-therapeutic on heparin 750 units/hr Heparin level at 0.89, decreasing, but remains supratherapeutic as expected with recent DOAC use.   CBC: Hgb decreased to 10.1, Plts elevated No bleeding noted per RN, no IV interruptions or pauses  Goal of Therapy:  Heparin level 0.3-0.5 units/ml aPTT 66-85 seconds Monitor platelets by anticoagulation protocol: Yes   Plan:  Increase to heparin IV infusion 800 units/hr -Check aPTT 8hrs after rate change. Anticipate heparin level will be elevated in setting of recent Eliquis use - will follow aPTT until correlation with heparin level -Daily heparin level, CBC while on heparin -Follow up plans for potential reschedule of GI procedure and Port placement planned for 3/18 (discussed with IR, ok to continue heparin infusion with no stop time.  Will be stopped on-call to IR)   Gretta Arab PharmD, BCPS [facility] main pharmacy [phone] 09/04/2022 7:23 AM

## 2022-09-04 NOTE — Procedures (Signed)
Interventional Radiology Procedure:   Indications: Metastatic lung cancer  Procedure: Port placement  Findings: Right jugular port, tip at SVC/RA junction  Complications: None     EBL: Minimal, less than 10 ml  Plan: Keep incisions dry for 24 hours.  Port is accessed and ready to use.    Wofford Stratton R. Anselm Pancoast, MD  Pager: [phone]

## 2022-09-04 NOTE — Progress Notes (Signed)
RN Anderson Malta called RN floor nurse Molinda Bailiff to discuss pt condition this am. [place] stated that the pt had been transferred to ICU for Afib RVR and increased 02 demand after a rapid response. She stated the pt remains on 02 at 8L and that she is on a Cardizem gtt for her hear rate control (currently keeping HR at 80-100). Rn communicated this information to [facility] RN and included Dr. Tammi Klippel in messaging as well. Rn Anderson Malta will continue to monitor pt condition and update staff.

## 2022-09-04 NOTE — Progress Notes (Signed)
START OFF PATHWAY REGIMEN - Non-Small Cell Lung   [id number]:Carboplatin + Paclitaxel (2/50) + RT weekly x 6 weeks:   Administer weekly during RT:     Paclitaxel      Carboplatin   **Always confirm dose/schedule in your pharmacy ordering system**  Patient Characteristics: Stage IV Metastatic, Nonsquamous, Molecular Analysis Completed, Molecular Alteration Present and Targeted Therapy Exhausted OR KRAS G12C+ or HER2+ Present and No Prior Chemo/Immunotherapy OR No Alteration Present, Initial Chemotherapy/Immunotherapy, PS =  2, No Alteration Present, No Alteration Present, PD-L1 Expression Positive 1-49% (TPS) / Negative / Not Tested / Awaiting Test Results Therapeutic Status: Stage IV Metastatic Histology: Nonsquamous Cell Broad Molecular Profiling Status: Molecular Analysis Completed Molecular Analysis Results: No Alteration Present ECOG Performance Status: 2 Chemotherapy/Immunotherapy Line of Therapy: Initial Chemotherapy/Immunotherapy EGFR Exons 18-21 Mutation Testing Status: Completed and Negative ALK Fusion/Rearrangement Testing Status: Completed and Negative BRAF V600 Mutation Testing Status: Completed and Negative KRAS G12C Mutation Testing Status: Completed and Negative MET Exon 14 Mutation Testing Status: Completed and Negative RET Fusion/Rearrangement Testing Status: Completed and Negative HER2 Mutation Testing Status: Completed and Negative NTRK Fusion/Rearrangement Testing Status: Completed and Negative ROS1 Fusion/Rearrangement Testing Status: Completed and Negative PD-L1 Expression Status: PD-L1 Positive 1-49% (TPS) Intent of Therapy: Non-Curative / Palliative Intent, Discussed with Patient

## 2022-09-04 NOTE — Progress Notes (Signed)
OT Cancellation Note  Patient Details Name: Rhonda Blackwell MRN: [id number] DOB: 1950/11/17   Cancelled Treatment:    Reason Eval/Treat Not Completed: Patient not medically ready Nurse asking for therapy to hold at this time with patient pending port placement. OT to continue to follow and check back as schedule will allow.  Rennie Plowman, [facility] [facility] Office# [phone]  09/04/2022, 9:57 AM

## 2022-09-04 NOTE — Progress Notes (Signed)
   NAMEAlitza Blackwell, MRN:  [id number], DOB:  12-27-1950, LOS: 6 ADMISSION DATE:  08/29/2022, CONSULTATION DATE: 08/29/2022 REFERRING MD: Dr. Flossie Buffy, CHIEF COMPLAINT: Shortness of breath, hypoxemic respiratory failure  History of Present Illness:  Patient presented to the hospital with shortness of breath Cough bringing up yellow phlegm, was recently hospitalized early February for pneumonia, sepsis Has had some chest tightness  Known to have A-fib, on anticoagulation  History of COPD, diabetes, hypertension, bronchitis  Recently diagnosed with metastatic cancer from a biopsy of scapula lesion  Known to have a left lung mass, recently had a bronchoscopy in January-patient does not recollect being told about cancer being found from that procedure, notes did not reveal endobronchial lesion on the left side.  Patient was dismissed from oncology practice secondary to multiple missed appointments-has not seen a pulmonologist in a year  She uses oxygen about 2 and half liters at home, same with exertion Uses inhalers  Pertinent  Medical History   Past Medical History:  Diagnosis Date   Bronchitis    COPD (chronic obstructive pulmonary disease) ([place])    Diabetes mellitus without complication ([place])    Hypertension     Significant Hospital Events: Including procedures, antibiotic start and stop dates in addition to other pertinent events   CT reviewed-left upper lobe mass, with probable lymphangitic spread, mediastinal adenopathy PET/CT 05/17/2022-hypermetabolic left upper lobe nodule, hypermetabolic left hilar and mediastinal lymph nodes, hypermetabolic left scapular lesion, hypermetabolic lesion in acetabulum 3/14 transferred to [facility] 3/15 received radiation therapy 3/17 A-fib with RVR, colonoscopy canceled  Interim History / Subjective:  Seems to be doing better after thoracentesis. Remains in afib on dilt gtt.  Objective   Blood pressure (!) 124/54, pulse 77, temperature 97.8  F (36.6 C), temperature source Axillary, resp. rate (!) 25, height 5\' 4"  (1.626 m), weight 66.5 kg, SpO2 95 %.        Intake/Output Summary (Last 24 hours) at 09/04/2022 0744 Last data filed at 09/04/2022 [id number] Gross per 24 hour  Intake 1001.98 ml  Output 1100 ml  Net -98.02 ml     Filed Weights   09/02/22 0526 09/03/22 1900 09/04/22 0600  Weight: 65.4 kg 65.4 kg 66.5 kg    Examination: Sleeping no distress RR in low 20s Near absent breath sounds on L, R is clear Ext trace edema  No new chest imaging No new labs  Resolved Hospital Problem list     Assessment & Plan:  Afib/RVR- per primary Metastatic cancer with threatened obstruction on L Likely MPE on L   - Will check intermittent CXRs, if she has reaccumulation would probably try pigtail with talc pleurodesis rather than pleurX to prevent all the outpatient f/u needs she would need with latter; hopefully radiation obviates this decision - Will follow peripherally  Erskine Emery MD PCCM

## 2022-09-04 NOTE — Progress Notes (Signed)
Unfortunately, Rhonda Blackwell is now down in the ICU.  She came down yesterday.  She went to rapid A-fib.  She had large left pleural effusion drained.  800 cc of fluid was removed.  A niece was with her.  I was able to talk to her niece about the situation.  She had an MRI of the brain which did not show any intraparenchymal brain lesions.  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  I told her niece that this was stage IV disease.  This is some that we certainly cannot cure.  I do worry about the fact that she has this obstruction.  I know she started radiation.  I think we will going to have to throw in some low-dose chemotherapy to see if we cannot improve the response so that the lung can open up.  She is going need to have a Port-A-Cath placed.  I probably would use low-dose Taxol/carboplatinum with the radiation.  Hopefully, we can get the Port-A-Cath put in tomorrow.  This is certainly a very tricky situation.  This is a fairly aggressive disease.  She does have quite a bit his disease in the chest itself.   Of note, she had a CEA level of 1400.  This is quite unusual for a lung cancer.  I was told that she was supposed to have a sigmoidoscopy to see about a rectal lesion that was seen on a PET scan couple months ago.  I think this is incredibly important.  It is hard to tell if her appetite is doing okay.  I think I put her on some Marinol to try to help with her appetite.  She does not seem to be in any distress which is nice to see.  Her vital signs show temperature 97.8.  Pulse 77.  Blood pressure 124/54.  Her lungs sound relatively clear over on the right side.  She may have some decrease over on the left side.  She has a couple wheezes.  Cardiac exam regular rate and rhythm consistent with the atrial fibrillation.  Extremity shows no clubbing, cyanosis or edema.  Abdominal exam is soft.  Bowel sounds are present.  Again, this is a tough situation.  Again I am impressed by the  markedly elevated CEA level.  Again this would be a little unusual for adenocarcinoma of the lung to have such a high CEA level.  However, it is certainly not unusual for metastatic rectal cancer to have this kind of level.  Again I think this is why a sigmoidoscopy would be helpful to see if there is a rectal primary that we might be overlooking.  We will have to see about a Port-A-Cath.  I think she will continue her radiation therapy to try to open up the obstructive bronchus.  I know that she will get incredible care from everybody in the ICU.  Rhonda Haw, MD  Rhonda Blackwell 1:37

## 2022-09-04 NOTE — Consult Note (Signed)
Chief Complaint: Patient was seen in consultation today for port a cath placement Chief Complaint  Patient presents with   Tachycardia    Referring Physician(s): Ennever,P  Supervising Physician: Markus Daft  Patient Status: [facility] - In-pt  History of Present Illness: Rhonda Blackwell is a 72 y.o. female smoker with medical hx including COPD, DM, HTN, bronchitis, afib,  anxiety, pleural effusions, nephrolithiasis, metastatic poorly diff adenoca of lung (underwent scapular bx in Dec 2023 at [facility]), hypermetabolic activity in rectum on outside PET scan, recent small left frontal white matter infarct, multiple calvarial mets. She is s/p recent left thoracentesis by CCM yielding 800 cc fluid. Pleural fluid cx neg to date, cytology pend. Her CEA level is 1408. She is on IV heparin/cardizem/zosyn. Request now received from oncology for port a cath placement to assist with treatment. She is afebrile, WBC 12.2, HGB 10.1, plts 408k.CXR yesterday : Left perihilar and lower lobe airspace disease, similar prior study.   Borderline heart size. Dilated pulmonary arteries compatible with pulmonary arterial hypertension.   No significant change   Past Medical History:  Diagnosis Date   Bronchitis    COPD (chronic obstructive pulmonary disease) ([place])    Diabetes mellitus without complication ([place])    Hypertension     Past Surgical History:  Procedure Laterality Date   CESAREAN SECTION     CHOLECYSTECTOMY     FOOT SURGERY     HERNIA REPAIR      Allergies: Bystolic [nebivolol hcl], Hydrochlorothiazide, and Norvasc [amlodipine]  Medications: Prior to Admission medications   Medication Sig Start Date End Date Taking? Authorizing Provider  acetaminophen (TYLENOL) 650 MG CR tablet Take 650 mg by mouth every 8 (eight) hours as needed for pain.   Yes [provider]  albuterol (PROVENTIL HFA;VENTOLIN HFA) 108 (90 BASE) MCG/ACT inhaler Inhale 2 puffs into the lungs every 6  (six) hours as needed for shortness of breath (cough). 05/28/15  Yes Mesner, Corene Cornea, MD  apixaban (ELIQUIS) 5 MG TABS tablet Take 1 tablet (5 mg total) by mouth 2 (two) times daily. 07/28/22 08/30/22 Yes Donne Hazel, MD  atorvastatin (LIPITOR) 40 MG tablet Take 40 mg by mouth daily.   Yes [provider]  calcium carbonate (OSCAL) 1500 (600 Ca) MG TABS tablet Take 600 mg of elemental calcium by mouth daily.   Yes [provider]  diltiazem (CARDIZEM CD) 120 MG 24 hr capsule Take 1 capsule (120 mg total) by mouth daily. 07/29/22 10/28/22 Yes Donne Hazel, MD  DULoxetine (CYMBALTA) 60 MG capsule Take 60 mg by mouth 2 (two) times daily. 06/05/22  Yes [provider]  fluticasone (FLONASE) 50 MCG/ACT nasal spray Place 2 sprays into both nostrils daily.   Yes [provider]  fluticasone-salmeterol (ADVAIR) 250-50 MCG/ACT AEPB Inhale 1 puff into the lungs 2 (two) times daily as needed (wheezing, shortness of breath.). 11/24/21  Yes [provider]  furosemide (LASIX) 20 MG tablet Take 20 mg by mouth daily. 07/18/22  Yes [provider]  irbesartan (AVAPRO) 150 MG tablet Take 150 mg by mouth 2 (two) times daily.   Yes [provider]  labetalol (NORMODYNE) 100 MG tablet Take 100 mg by mouth 2 (two) times daily.   Yes [provider]  pantoprazole (PROTONIX) 40 MG tablet Take 40 mg by mouth daily.   Yes [provider]  sitaGLIPtan-metformin (JANUMET) 50-500 MG per tablet Take 1 tablet by mouth 2 (two) times daily with a meal.  Yes [provider]  azithromycin (ZITHROMAX) 250 MG tablet Take 1 tablet (250 mg total) by mouth daily for 2 more days Patient not taking: Reported on 08/30/2022 07/28/22   Donne Hazel, MD     Family History  Problem Relation Age of Onset   Diabetes Mellitus II Sister     Social History   Socioeconomic History   Marital status: Married    Spouse name: Not on file   Number of children:  Not on file   Years of education: Not on file   Highest education level: Not on file  Occupational History   Not on file  Tobacco Use   Smoking status: Some Days    Packs/day: 1    Types: Cigarettes   Smokeless tobacco: Never  Substance and Sexual Activity   Alcohol use: No   Drug use: No   Sexual activity: Not on file  Other Topics Concern   Not on file  Social History Narrative   Not on file   Social Determinants of Health   Financial Resource Strain: Not on file  Food Insecurity: No Food Insecurity (08/30/2022)   Hunger Vital Sign    Worried About Running Out of Food in the Last Year: Never true    Ran Out of Food in the Last Year: Never true  Transportation Needs: No Transportation Needs (08/30/2022)   PRAPARE - Hydrologist (Medical): No    Lack of Transportation (Non-Medical): No  Physical Activity: Not on file  Stress: Not on file  Social Connections: Not on file      Review of Systems denies fever,HA, abd/back pain,N/V or bleeding. She does have occ chest discomfort, dyspnea, occ cough  Vital Signs: BP (!) 124/54   Pulse 77   Temp 98.5 F (36.9 C) (Axillary)   Resp (!) 25   Ht 5\' 4"  (1.626 m)   Wt 146 lb 9.7 oz (66.5 kg)   SpO2 95%   BMI 25.16 kg/m      Physical Exam awake/answers questions ok; chest- dim BS on left, clear on right; heart- nl rate, occ ectopy/irregular; abd- soft, few BS,NT; trace pretibial edema  Imaging: DG CHEST PORT 1 VIEW  Result Date: 09/03/2022 CLINICAL DATA:  Post thoracentesis EXAM: PORTABLE CHEST 1 VIEW COMPARISON:  09/03/2022 FINDINGS: Decreasing left effusion following thoracentesis. Airspace disease in the left upper lobe and left lower lobe. Heart is borderline in size. Aortic atherosclerosis. Prominent hila bilaterally shown on prior CT to reflect dilated pulmonary arteries compatible with pulmonary arterial hypertension. No pneumothorax. IMPRESSION: Decreasing left effusion following  thoracentesis.  No pneumothorax. Left upper lobe and left lower lobe airspace disease concerning for pneumonia. Electronically Signed   By: Rolm Baptise M.D.   On: 09/03/2022 20:08   DG CHEST PORT 1 VIEW  Result Date: 09/03/2022 CLINICAL DATA:  Shortness of breath EXAM: PORTABLE CHEST 1 VIEW COMPARISON:  09/03/2022 FINDINGS: Heart upper limits normal in size. Aortic atherosclerosis. Bilateral hilar fullness shown on prior CT to reflect dilated pulmonary arteries compatible with pulmonary arterial hypertension. Left perihilar and lower lobe airspace disease, similar to earlier study. No confluent opacity on the right. No visible effusions or acute bony abnormality. IMPRESSION: Left perihilar and lower lobe airspace disease, similar prior study. Borderline heart size. Dilated pulmonary arteries compatible with pulmonary arterial hypertension. No significant change. Electronically Signed   By: Rolm Baptise M.D.   On: 09/03/2022 20:07   DG CHEST PORT 1 VIEW  Result  Date: 09/03/2022 CLINICAL DATA:  Shortness of breath EXAM: PORTABLE CHEST 1 VIEW COMPARISON:  08/29/2022 FINDINGS: Cardiac shadow is stable but obscured by enlarging left-sided pleural effusion and left opacity. Right lung remains clear. No bony abnormality is noted. IMPRESSION: Significant increased opacity in the left hemithorax consistent with new pleural effusion and likely increasing consolidation. These results will be called to the ordering clinician or representative by the Radiologist Assistant, and communication documented in the PACS or [employer]. Electronically Signed   By: Inez Catalina M.D.   On: 09/03/2022 12:00   CT ANGIO HEAD NECK W WO CM  Result Date: 09/02/2022 CLINICAL DATA:  Acute neurologic deficit EXAM: CT ANGIOGRAPHY HEAD AND NECK TECHNIQUE: Multidetector CT imaging of the head and neck was performed using the standard protocol during bolus administration of intravenous contrast. Multiplanar CT image reconstructions  and MIPs were obtained to evaluate the vascular anatomy. Carotid stenosis measurements (when applicable) are obtained utilizing NASCET criteria, using the distal internal carotid diameter as the denominator. RADIATION DOSE REDUCTION: This exam was performed according to the departmental dose-optimization program which includes automated exposure control, adjustment of the mA and/or kV according to patient size and/or use of iterative reconstruction technique. CONTRAST:  18mL OMNIPAQUE IOHEXOL 350 MG/ML SOLN COMPARISON:  None Available. FINDINGS: CT HEAD FINDINGS Brain: There is no mass, hemorrhage or extra-axial collection. The size and configuration of the ventricles and extra-axial CSF spaces are normal. Multiple old infarcts of the deep gray nuclei. There is hypoattenuation of the periventricular white matter, most commonly indicating chronic ischemic microangiopathy. Incidentally noted partially empty sella. Skull: Multiple lucent lesions of the calvarium, largest located in the right parietal bone with transcortical extension. Sinuses/Orbits: No fluid levels or advanced mucosal thickening of the visualized paranasal sinuses. No mastoid or middle ear effusion. The orbits are normal. CTA NECK FINDINGS SKELETON: There is no bony spinal canal stenosis. No lytic or blastic lesion. OTHER NECK: Normal pharynx, larynx and major salivary glands. No cervical lymphadenopathy. Unremarkable thyroid gland. UPPER CHEST: Consolidative opacity in the left upper lobe. Bilateral pleural effusions. AORTIC ARCH: There is calcific atherosclerosis of the aortic arch. There is no aneurysm, dissection or hemodynamically significant stenosis of the visualized portion of the aorta. Conventional 3 vessel aortic branching pattern. The visualized proximal subclavian arteries are widely patent. RIGHT CAROTID SYSTEM: No dissection, occlusion or aneurysm. Mild atherosclerotic calcification at the carotid bifurcation without hemodynamically  significant stenosis. LEFT CAROTID SYSTEM: No dissection, occlusion or aneurysm. Mild atherosclerotic calcification at the carotid bifurcation without hemodynamically significant stenosis. VERTEBRAL ARTERIES: Codominant configuration. Both origins are clearly patent. There is no dissection, occlusion or flow-limiting stenosis to the skull base (V1-V3 segments). CTA HEAD FINDINGS POSTERIOR CIRCULATION: --Vertebral arteries: Normal V4 segments. --Inferior cerebellar arteries: Normal. --Basilar artery: Normal. --Superior cerebellar arteries: Normal. --Posterior cerebral arteries (PCA): Normal. ANTERIOR CIRCULATION: --Intracranial internal carotid arteries: Normal. --Anterior cerebral arteries (ACA): Normal. Both A1 segments are present. Patent anterior communicating artery (a-comm). --Middle cerebral arteries (MCA): Normal. VENOUS SINUSES: As permitted by contrast timing, patent. ANATOMIC VARIANTS: None Review of the MIP images confirms the above findings. IMPRESSION: 1. No emergent large vessel occlusion or hemodynamically significant stenosis of the head or neck. 2. 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 3. Masslike consolidation in the left upper lobe, as characterized on recent chest CT. 4. Bilateral pleural effusions. Aortic atherosclerosis (ICD10-I70.0). Electronically Signed   By: Ulyses Jarred M.D.   On: 09/02/2022 23:17   MR BRAIN  W WO CONTRAST  Result Date: 09/02/2022 CLINICAL DATA:  Non-small cell lung cancer staging EXAM: MRI HEAD WITHOUT AND WITH CONTRAST TECHNIQUE: Multiplanar, multiecho pulse sequences of the brain and surrounding structures were obtained without and with intravenous contrast. CONTRAST:  6.22mL GADAVIST GADOBUTROL 1 MMOL/ML IV SOLN COMPARISON:  None Available. FINDINGS: Brain: Focus of restricted diffusion in the high left frontal white matter is nonenhancing and elongated, consistent with lacunar infarct. Multiple  lacunar infarcts are seen in the deep cerebral white matter with adjacent chronic ischemic gliosis. Chronic left basal ganglia perforator infarct. Generalized cerebral volume loss. No enhancing metastatic disease to the brain. Vascular: Major flow voids and vascular enhancements are preserved Skull and upper cervical spine: Multiple bone lesions, most notably a 2 cm deposit in the posterior right parietal bone extending through both inner and outer table. 15 mm lesion at the left paramedian vertex reaches the inner table as do some smaller right parietal bone metastases. Sinuses/Orbits: Negative IMPRESSION: 1. Small acute infarct in the left frontal white matter. Chronic small vessel ischemia with multiple chronic lacunar infarcts. 2. 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 3. Negative for brain metastasis. Electronically Signed   By: Jorje Guild M.D.   On: 09/02/2022 12:38   NM Bone Scan Whole Body  Result Date: 09/01/2022 CLINICAL DATA:  Non-small cell lung cancer. EXAM: NUCLEAR MEDICINE WHOLE BODY BONE SCAN TECHNIQUE: Whole body anterior and posterior images were obtained approximately 3 hours after intravenous injection of radiopharmaceutical. RADIOPHARMACEUTICALS:  21.5 mCi Technetium-70m MDP IV COMPARISON:  None Available. FINDINGS: Several focal accumulation of radiotracer activity within the midthoracic and upper lumbar spine consistent with metastatic disease. Multiple posterior LEFT and RIGHT rib lesions. Abnormal accumulation within the LEFT scapula additionally. Mild involvement of the LEFT hemipelvis. IMPRESSION: Multifocal skeletal metastasis involving the spine, ribs, LEFT scapula and minimal involvement LEFT hemipelvis. Electronically Signed   By: Suzy Bouchard M.D.   On: 09/01/2022 14:45   CT Chest W Contrast  Result Date: 08/29/2022 CLINICAL DATA:  History of chest pain and diaphoresis, pneumonia EXAM: CT CHEST  WITH CONTRAST TECHNIQUE: Multidetector CT imaging of the chest was performed during intravenous contrast administration. RADIATION DOSE REDUCTION: This exam was performed according to the departmental dose-optimization program which includes automated exposure control, adjustment of the mA and/or kV according to patient size and/or use of iterative reconstruction technique. CONTRAST:  63mL OMNIPAQUE IOHEXOL 350 MG/ML SOLN COMPARISON:  08/29/2022 FINDINGS: Cardiovascular: There is a trace pericardial effusion. The heart is not enlarged. Calcifications of the mitral annulus. Diffuse atherosclerosis of the coronary vasculature. Dilated main pulmonary artery measuring 4 cm consistent with pulmonary arterial hypertension. There is insufficient contrast enhancement within the pulmonary vasculature to assess for pulmonary emboli. No evidence of thoracic aortic aneurysm or dissection. Diffuse atherosclerosis of the thoracic aorta without flow limiting stenosis. Mediastinum/Nodes: Thyroid, trachea, and esophagus are unremarkable. Mediastinal and hilar adenopathy, measuring up to 10 mm in the subcarinal region. Lungs/Pleura: There are small bilateral pleural effusions, left greater than right, volume estimated less than 500 cc each. Upper lobe predominant emphysematous changes are noted. No pneumothorax. There is masslike consolidation within the left upper lobe abutting the left hilum, measuring 4.0 x 4.2 cm reference image 54/4, with more peripheral areas of consolidation and interlobular septal thickening elsewhere within the left upper lobe. Findings are more concerning for central neoplasm and lymphangitic spread of disease within the left upper lobe, rather than acute pneumonia, given  the additional findings seen within the thoracic cage. Upper Abdomen: No acute abnormality. Musculoskeletal: There are numerous lytic lesions throughout the visualized thoracolumbar spine, most pronounced at T2, T6, T9, and L2. There are  associated pathologic compression fractures at T6, T9, and L2, with less than 25% loss of height and no retropulsion. Additional lytic metastatic lesions are seen within the left scapula and bilateral thoracic cage. Pathologic rib fractures are seen within the right fifth and sixth ribs, as well as within the left second, fifth, and sixth ribs. Reconstructed images demonstrate no additional findings. IMPRESSION: 1. Masslike consolidation within the left upper lobe abutting the left hilum, concerning for central obstructing neoplasm. 2. Peripheral areas of consolidation and interlobular septal thickening throughout the left upper lobe, concerning for a combination of postobstructive changes, regional metastatic disease, and lymphangitic spread of tumor. 3. Mediastinal and hilar adenopathy consistent with nodal metastases. 4. Diffuse lytic bony metastases as above. 5. Small bilateral pleural effusions, left greater than right. 6. Dilated main pulmonary artery consistent with pulmonary arterial hypertension. 7. Trace pericardial effusion. 8. Aortic Atherosclerosis (ICD10-I70.0) and Emphysema (ICD10-J43.9). Electronically Signed   By: Randa Ngo M.D.   On: 08/29/2022 17:59   DG Chest Portable 1 View  Result Date: 08/29/2022 CLINICAL DATA:  Chest pain, diaphoresis EXAM: PORTABLE CHEST 1 VIEW COMPARISON:  Portable exam 1412 hours compared to 07/27/2022 FINDINGS: Normal heart size and pulmonary vascularity. Atherosclerotic calcification aorta. Enlargement of RIGHT hilum, questionably LEFT hilum. Emphysematous changes with increased opacity in LEFT upper lobe and BILATERAL lower lobe since previous study question infiltrate. No pleural effusion or pneumothorax. Bones demineralized. IMPRESSION: Increased pulmonary infiltrates question pneumonia. Persistent hilar enlargement mass/adenopathy not excluded; CT chest with contrast recommended for further assessment. Electronically Signed   By: Lavonia Dana M.D.   On:  08/29/2022 14:48    Labs:  CBC: Recent Labs    09/01/22 0545 09/02/22 0524 09/03/22 0351 09/04/22 0614  WBC 10.7* 9.9 11.0* 12.2*  HGB 10.8* 11.0* 11.1* 10.1*  HCT 34.0* 33.5* 35.3* 31.2*  PLT 426* 465* 456* 408*    COAGS: Recent Labs    07/24/22 0045 07/24/22 0243 09/02/22 2248 09/03/22 1804 09/03/22 1929 09/04/22 0614  INR 1.1 1.0  --   --   --   --   APTT 34  --  55* 131* 105* 59*    BMP: Recent Labs    09/01/22 0545 09/02/22 0524 09/03/22 0351 09/04/22 0614  NA 136 131* 132* 126*  K 3.6 3.3* 3.6 3.5  CL 94* 94* 94* 93*  CO2 30 29 26 25   GLUCOSE 145* 141* 177* 131*  BUN 10 14 13 14   CALCIUM 9.2 8.9 8.8* 7.5*  CREATININE 0.57 0.67 0.79 0.75  GFRNONAA >60 >60 >60 >60    LIVER FUNCTION TESTS: Recent Labs    07/28/22 0113 08/29/22 1400 09/02/22 0524 09/04/22 0614  BILITOT 0.6 0.7 1.2 0.8  AST 79* 18 13* 19  ALT 68* 12 13 17   ALKPHOS 90 97 95 103  PROT 6.2* 7.1 6.9 6.3*  ALBUMIN 2.6* 3.2* 3.0* 2.7*    TUMOR MARKERS: No results for input(s): "AFPTM", "CEA", "CA199", "CHROMGRNA" in the last 8760 hours.  Assessment and Plan: 72 y.o. female smoker with medical hx including COPD, DM, HTN, bronchitis, afib,  anxiety, pleural effusions, nephrolithiasis, metastatic poorly diff adenoca of lung (underwent scapular bx in Dec 2023 at [facility]), hypermetabolic activity in rectum on outside PET scan, recent small left frontal white matter infarct, multiple  calvarial mets. She is s/p recent left thoracentesis by CCM yielding 800 cc fluid. Pleural fluid cx neg to date, cytology pend. Her CEA level is 1408. She is on IV heparin/cardizem/zosyn. Request now received from oncology for port a cath placement to assist with treatment. She is afebrile, WBC 12.2, HGB 10.1, plts 408k.CXR yesterday : Left perihilar and lower lobe airspace disease, similar prior study.   Borderline heart size. Dilated pulmonary arteries compatible with pulmonary arterial  hypertension.   No significant change  Risks and benefits of image guided port-a-catheter placement was discussed with the patient /spouse /family including, but not limited to bleeding, infection, pneumothorax, or fibrin sheath development and need for additional procedures.  All of the patient's questions were answered, patient is agreeable to proceed. Consent signed and in chart.    Thank you for this interesting consult.  I greatly enjoyed meeting Rhonda Blackwell and look forward to participating in their care.  A copy of this report was sent to the requesting provider on this date.  Electronically Signed: D. Rowe Robert, PA-C 09/04/2022, 9:43 AM   I spent a total of  25 minutes   in face to face in clinical consultation, greater than 50% of which was counseling/coordinating care for port a cath placement

## 2022-09-05 ENCOUNTER — Inpatient Hospital Stay (HOSPITAL_COMMUNITY): Payer: Medicare Other

## 2022-09-05 ENCOUNTER — Other Ambulatory Visit: Payer: Self-pay

## 2022-09-05 ENCOUNTER — Ambulatory Visit
Admission: RE | Admit: 2022-09-05 | Discharge: 2022-09-05 | Discharge status: Home / Self Care | Payer: Medicare Other | Source: Ambulatory Visit | Attending: Radiation Oncology

## 2022-09-05 DIAGNOSIS — J9621 Acute and chronic respiratory failure with hypoxia: Secondary | ICD-10-CM | POA: Diagnosis not present

## 2022-09-05 DIAGNOSIS — E871 Hypo-osmolality and hyponatremia: Secondary | ICD-10-CM

## 2022-09-05 DIAGNOSIS — C3492 Malignant neoplasm of unspecified part of left bronchus or lung: Secondary | ICD-10-CM

## 2022-09-05 DIAGNOSIS — I480 Paroxysmal atrial fibrillation: Secondary | ICD-10-CM | POA: Diagnosis not present

## 2022-09-05 DIAGNOSIS — I639 Cerebral infarction, unspecified: Secondary | ICD-10-CM | POA: Diagnosis not present

## 2022-09-05 LAB — HGB FRACTIONATION CASCADE
Hgb A2: 1.9 % (ref 1.8–3.2)
Hgb A: 98.1 % (ref 96.4–98.8)
Hgb F: 0 % (ref 0.0–2.0)
Hgb S: 0 %

## 2022-09-05 LAB — CBC
HCT: 29.5 % — ABNORMAL LOW (ref 36.0–46.0)
Hemoglobin: 9.3 g/dL — ABNORMAL LOW (ref 12.0–15.0)
MCH: 25.2 pg — ABNORMAL LOW (ref 26.0–34.0)
MCHC: 31.5 g/dL (ref 30.0–36.0)
MCV: 79.9 fL — ABNORMAL LOW (ref 80.0–100.0)
Platelets: 383 10*3/uL (ref 150–400)
RBC: 3.69 MIL/uL — ABNORMAL LOW (ref 3.87–5.11)
RDW: 13.9 % (ref 11.5–15.5)
WBC: 9.4 10*3/uL (ref 4.0–10.5)
nRBC: 0.2 % (ref 0.0–0.2)

## 2022-09-05 LAB — COMPREHENSIVE METABOLIC PANEL
ALT: 14 U/L (ref 0–44)
AST: 18 U/L (ref 15–41)
Albumin: 2.5 g/dL — ABNORMAL LOW (ref 3.5–5.0)
Alkaline Phosphatase: 97 U/L (ref 38–126)
Anion gap: 6 (ref 5–15)
BUN: 18 mg/dL (ref 8–23)
CO2: 23 mmol/L (ref 22–32)
Calcium: 6.9 mg/dL — ABNORMAL LOW (ref 8.9–10.3)
Chloride: 98 mmol/L (ref 98–111)
Creatinine, Ser: 0.81 mg/dL (ref 0.44–1.00)
GFR, Estimated: >60 mL/min (ref >60)
Glucose, Bld: 135 mg/dL — ABNORMAL HIGH (ref 70–99)
Potassium: 3.1 mmol/L — ABNORMAL LOW (ref 3.5–5.1)
Sodium: 127 mmol/L — ABNORMAL LOW (ref 135–145)
Total Bilirubin: 0.7 mg/dL (ref 0.3–1.2)
Total Protein: 5.9 g/dL — ABNORMAL LOW (ref 6.5–8.1)

## 2022-09-05 LAB — APTT: aPTT: 66 seconds — ABNORMAL HIGH (ref 24–36)

## 2022-09-05 LAB — HEPARIN LEVEL (UNFRACTIONATED)
Heparin Unfractionated: 0.4 IU/mL (ref 0.30–0.70)
Heparin Unfractionated: 0.45 IU/mL (ref 0.30–0.70)
Heparin Unfractionated: 0.29 IU/mL — ABNORMAL LOW (ref 0.30–0.70)

## 2022-09-05 LAB — GLUCOSE, CAPILLARY
Glucose-Capillary: 142 mg/dL — ABNORMAL HIGH (ref 70–99)
Glucose-Capillary: 167 mg/dL — ABNORMAL HIGH (ref 70–99)

## 2022-09-05 LAB — RAD ONC ARIA SESSION SUMMARY
Course Elapsed Days: 4
Plan Fractions Treated to Date: 2
Plan Prescribed Dose Per Fraction: 3 Gy
Plan Total Fractions Prescribed: 8
Plan Total Prescribed Dose: 24 Gy
Reference Point Dosage Given to Date: 6 Gy
Reference Point Session Dosage Given: 3 Gy
Session Number: 3

## 2022-09-05 LAB — MAGNESIUM: Magnesium: 2.3 mg/dL (ref 1.7–2.4)

## 2022-09-05 LAB — EXPECTORATED SPUTUM ASSESSMENT W GRAM STAIN, RFLX TO RESP C

## 2022-09-05 LAB — PHOSPHORUS: Phosphorus: 2.2 mg/dL — ABNORMAL LOW (ref 2.5–4.6)

## 2022-09-05 LAB — VITAMIN D 25 HYDROXY (VIT D DEFICIENCY, FRACTURES): Vit D, 25-Hydroxy: 50 ng/mL (ref 30–100)

## 2022-09-05 MED ORDER — CALCIUM GLUCONATE-NACL 1-0.675 GM/50ML-% IV SOLN
1.0000 g | Freq: Once | INTRAVENOUS | Status: AC
  Administered 2022-09-05: 1000 mg via INTRAVENOUS
  Filled 2022-09-05: qty 50

## 2022-09-05 MED ORDER — POTASSIUM CHLORIDE CRYS ER 10 MEQ PO TBCR
40.0000 meq | EXTENDED_RELEASE_TABLET | ORAL | Status: DC
  Administered 2022-09-05: 40 meq via ORAL
  Filled 2022-09-05: qty 4

## 2022-09-05 MED ORDER — LIP MEDEX EX OINT: TOPICAL_OINTMENT | CUTANEOUS | Status: DC | PRN

## 2022-09-05 MED ORDER — SODIUM CHLORIDE 0.9% FLUSH
10.0000 mL | Freq: Three times a day (TID) | INTRAVENOUS | Status: DC
  Administered 2022-09-05 – 2022-09-06 (×4): 10 mL via INTRAPLEURAL

## 2022-09-05 MED ORDER — FAMOTIDINE IN NACL 20-0.9 MG/50ML-% IV SOLN
20.0000 mg | Freq: Once | INTRAVENOUS | Status: AC
  Administered 2022-09-06: 20 mg via INTRAVENOUS
  Filled 2022-09-05: qty 50

## 2022-09-05 MED ORDER — SODIUM CHLORIDE 0.9 % IV SOLN
10.0000 mg | Freq: Once | INTRAVENOUS | Status: AC
  Administered 2022-09-06: 10 mg via INTRAVENOUS
  Filled 2022-09-05: qty 1

## 2022-09-05 MED ORDER — FUROSEMIDE 10 MG/ML IJ SOLN
40.0000 mg | Freq: Once | INTRAMUSCULAR | Status: AC
  Administered 2022-09-05: 40 mg via INTRAVENOUS
  Filled 2022-09-05: qty 4

## 2022-09-05 MED ORDER — DIPHENHYDRAMINE HCL 50 MG/ML IJ SOLN
50.0000 mg | Freq: Once | INTRAMUSCULAR | Status: AC
  Administered 2022-09-06: 50 mg via INTRAVENOUS
  Filled 2022-09-05: qty 1

## 2022-09-05 MED ORDER — SODIUM CHLORIDE 0.9 % IV SOLN
3.0000 g | Freq: Four times a day (QID) | INTRAVENOUS | Status: AC
  Administered 2022-09-05 – 2022-09-12 (×29): 3 g via INTRAVENOUS
  Filled 2022-09-05 (×29): qty 8

## 2022-09-05 MED ORDER — PALONOSETRON HCL INJECTION 0.25 MG/5ML
0.2500 mg | Freq: Once | INTRAVENOUS | Status: AC
  Administered 2022-09-06: 0.25 mg via INTRAVENOUS
  Filled 2022-09-05: qty 5

## 2022-09-05 MED ORDER — FLEET ENEMA 7-19 GM/118ML RE ENEM: 2.0000 | ENEMA | Freq: Once | RECTAL | Status: DC

## 2022-09-05 MED ORDER — K PHOS MONO-SOD PHOS DI & MONO 155-852-130 MG PO TABS
500.0000 mg | ORAL_TABLET | Freq: Two times a day (BID) | ORAL | Status: AC
  Administered 2022-09-05 – 2022-09-07 (×6): 500 mg via ORAL
  Filled 2022-09-05 (×6): qty 2

## 2022-09-05 MED ORDER — POTASSIUM CHLORIDE 10 MEQ/100ML IV SOLN
10.0000 meq | INTRAVENOUS | Status: AC
  Administered 2022-09-05 (×4): 10 meq via INTRAVENOUS
  Filled 2022-09-05 (×4): qty 100

## 2022-09-05 MED ORDER — HEPARIN (PORCINE) 25000 UT/250ML-% IV SOLN
1000.0000 [IU]/h | INTRAVENOUS | Status: DC
  Administered 2022-09-05: 900 [IU]/h via INTRAVENOUS
  Administered 2022-09-06: 1000 [IU]/h via INTRAVENOUS
  Filled 2022-09-05 (×2): qty 250

## 2022-09-05 MED ORDER — POTASSIUM CHLORIDE CRYS ER 20 MEQ PO TBCR
40.0000 meq | EXTENDED_RELEASE_TABLET | Freq: Once | ORAL | Status: AC
  Administered 2022-09-05: 40 meq via ORAL
  Filled 2022-09-05: qty 2

## 2022-09-05 NOTE — Progress Notes (Signed)
OT Cancellation Note  Patient Details Name: Rhonda Blackwell MRN: [id number] DOB: 1950/06/22   Cancelled Treatment:    Reason Eval/Treat Not Completed: Medical issues which prohibited therapy Patient is pending radiation and chest tube placement at this time. OT to continue to follow and check back as schedule will allow Rennie Plowman, MS Acute Rehabilitation Department Office# [phone]  09/05/2022, 9:44 AM

## 2022-09-05 NOTE — Progress Notes (Signed)
Found PT off BiPAP and does not appear to be respiratory distress at this time. BiPAP order changed to PRN.

## 2022-09-05 NOTE — Progress Notes (Signed)
ANTICOAGULATION CONSULT NOTE - Follow Up Consult  Pharmacy Consult for Heparin Indication: atrial fibrillation (apixaban on hold)  Allergies  Allergen Reactions   Bystolic [Nebivolol Hcl] Nausea Only and Other (See Comments)    Abdominal pain   Hydrochlorothiazide Nausea Only and Swelling    Leg swelling   Norvasc [Amlodipine] Swelling    Leg swelling    Patient Measurements: Height: 5\' 4"  (162.6 cm) Weight: 66.5 kg (146 lb 9.7 oz) IBW/kg (Calculated) : 54.7 Heparin Dosing Weight: TBW  Vital Signs: Temp: 98.1 F (36.7 C) (03/19 0400) Temp Source: Oral (03/19 0400) BP: 126/61 (03/19 0630) Pulse Rate: 67 (03/19 0530)  Labs: Recent Labs    09/03/22 0351 09/03/22 1804 09/03/22 1929 09/04/22 0614 09/05/22 0313 09/05/22 0501  HGB 11.1*  --   --  10.1* 9.3*  --   HCT 35.3*  --   --  31.2* 29.5*  --   PLT 456*  --   --  408* 383  --   APTT  --    < > 105* 59* 66*  --   HEPARINUNFRC >1.10*  --   --  0.89* 0.40 0.45  CREATININE 0.79  --   --  0.75 0.81  --    < > = values in this interval not displayed.     Estimated Creatinine Clearance: 59.7 mL/min (by C-G formula based on SCr of 0.81 mg/dL).   Medications:  Infusions:   sodium chloride 100 mL/hr at 09/05/22 0600   diltiazem (CARDIZEM) infusion Stopped (09/05/22 0600)   heparin 800 Units/hr (09/05/22 0600)   piperacillin-tazobactam 12.5 mL/hr at 09/05/22 0600    Assessment: 3 yoF admitted on 3/12 with history of afib on Eliquis.  She had run out of Eliquis  at home for about a week, but was continued inpatient through 3/15 at 21:00, then held in anticipation of procedures. 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  GI was planning flexible sigmoidoscopy 3/17 to investigate abnormal PET scan findings, but was delayed d/t clinical instability.  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  Pharmacy consulted for heparin management by Neurology for atrial  fibrillation - neuro scale.   Significant Events: 3/17: heparin drip stopped at 0400 for planned flex sig; procedure cancelled due to change in cardiac and respiratory status. Heparin drip restarted at 0930 3/18 Heparin held briefly while in IR for Port placement   Today, 09/05/2022:  Heparin level at 0.29, decreased to sub-therapeutic on heparin 800 units/hr CBC: Hgb decreased to 9.3, Plts wnl No complications of therapy noted  Goal of Therapy:  Heparin level 0.3-0.5 units/ml aPTT 66-85 seconds Monitor platelets by anticoagulation protocol: Yes   Plan:  - Increase to heparin IV infusion 900 units/hr - Repeat heparin level in 6 hr to confirm therapeutic dose - Daily heparin level, CBC while on heparin - Follow up plans for potential reschedule of GI procedure   Gretta Arab PharmD, BCPS [facility] main pharmacy [phone] 09/05/2022 7:18 AM

## 2022-09-05 NOTE — Progress Notes (Signed)
She seems to be doing better.  Her heart is back in sinus rhythm.  She is on the Cardizem drip.  She is on heparin.  She had her Port-A-Cath placed yesterday.  She is getting radiotherapy.  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  Hopefully, we will be able to do the chemotherapy tomorrow.  She is supposed to have a sigmoidoscopy.  This was put off because of her atrial fibrillation and moving down to the stepdown unit.  I would think that this could still be done since she seems to be more stable.  We really need to make sure that she eats better.  Her labs today show a sodium of 127.  Potassium 3.1.  BUN 18 creatinine 0.81.  Calcium 6.9 with an albumin of 2.5.  Her white count is 9.4.  Hemoglobin 9.3.  Platelet count 383,000.  Her vital signs show temperature 98.1.  Pulse 67.  Blood pressure 126/61.  Her head neck exam shows no ocular or oral lesions.  She has no adenopathy in the neck.  Lungs are clear over on the right side.  Left side shows some slight decrease.  There is wheezing on the left side.  Abdomen is soft.  Bowel sounds are present.  There is no guarding or rebound tenderness.  Her extremities shows no clubbing, cyanosis or edema.  We will continue on the radiotherapy.  Again, we will try to add chemotherapy tomorrow.  It would just be weekly.  It is low-dose.  She will down the ICU.  I would think that she could receive treatment while in the ICU.  Again she does need to have a sigmoidoscopy to evaluate the rectum.  Will really be nice to get another PET scan on her but again, being an inpatient, this will not be done.  I know that she will get incredible care from everybody down the ICU.   Kerby Nora, MD  Colossians 3:23

## 2022-09-05 NOTE — Plan of Care (Signed)
  Problem: Health Behavior/Discharge Planning: Goal: Ability to manage health-related needs will improve Outcome: Progressing   

## 2022-09-05 NOTE — Progress Notes (Addendum)
PROGRESS NOTE    Rhonda Blackwell  [id number] DOB: 01/14/1951 DOA: 08/29/2022 PCP: [name], MD    Chief Complaint  Patient presents with   Tachycardia    Brief Narrative:  Rhonda Blackwell is a 72 y.o. female with medical history significant of COPD, Type 2 diabetes, CVA, hypertension atrial fibrillation on Eliquis, metastatic disease with unknown primary presented to the hospital with increasing shortness of breath.  Of note, patient was recently admitted between  07/23/22 to 07/27/22 with sepsis secondary to community-acquired multifocal pneumonia.  She was discharged on 2 L nasal cannula at rest and up to 3 L nasal cannula on ambulation.  Patient was also recently diagnosed atrial fibrillation and was on Eliquis.  Ever since her discharge, patient was feeling unwell and had persistent shortness of breath and had ran out of her Eliquis after 30 days.  History of hypermetabolic left upper lung nodule as well as hypermetabolic hilar and mediastinal lymph nodes, left scapular lesion, area of right acetabulum and rectum, concerning for metastatic disease on PET scan.  Patient was followed by [facility] pulmonology/oncology, underwent CT-guided percutaneous biopsy of left scapular lytic bone lesion on 06/08/2022 and bronchoscopy with EBUS on 07/04/2022.  Pt then did not follow up with oncology on multiple appointments.    In the ED, on this presentation, she was afebrile and normotensive.  Initially noted to be in atrial fibrillation with RVR but this  resolved spontaneously.  She was initially was placed on 2 L, however continued have significant oxygen desaturation was ultimately placed on 8 L high flow nasal cannula.  Mild leukocytosis at 12.5.  Troponin was 30. Negative flu/COVID/RSV. CT chest was obtained which showed masslike consolidation within the left upper lobe abutting the left hilar concerning for central obstructing neoplasm.  There is peripheral areas of consolidation and interlobar  septal thickening throughout the left upper lobe concerning for combination of postobstructive changes, regional metastatic disease and lymphangitic spread of tumor.  Mediastinal and hilar adenopathy consistent with nodule metastasis. 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 EDP consulted pulmonology and patient was admitted to the hospital for further evaluation and treatment.   Assessment & Plan:   Principal Problem:   Acute on chronic respiratory failure with hypoxia (HCC) Active Problems:   Type 2 diabetes mellitus without complications (HCC)   Essential hypertension   Hyponatremia   Paroxysmal atrial fibrillation with RVR (HCC)   History of CVA (cerebrovascular accident)   Chronic anxiety   Tobacco use   Malignant neoplasm of hilus of left lung (HCC)   Lung mass   Acute CVA (cerebrovascular accident) ([place])   Pleural effusion on left   Lung cancer, primary, with metastasis from lung to other site, left (HCC)   Hypocalcemia   Hypophosphatemia  #1 acute on chronic respiratory failure with hypoxia/lung mass/large left pleural effusion -Acute on chronic respiratory failure likely multifactorial secondary to lung mass, postobstructive pneumonia in the setting of large left pleural effusion. -Patient noted to have been on 2 L of O2 at baseline by needed up to 15 L of O2 initially on presentation, O2 requirements initially improving however patient went into A-fib RVR with increased O2 requirements, chest x-ray concerning for large left pleural effusion. -Patient seen by PCCM, underwent thoracentesis 09/03/2022 with 800 cc of fluid removed and subsequently the evening of 09/03/2022 noted to have increased O2 requirements requiring up as high as 12 L high flow nasal cannula and subsequently transferred  to the stepdown unit.   -Currently on 6 L high flow nasal cannula. -Patient initially noted to have been discharged from oncology office due  to lack of follow-up for scheduled appointments. -CT chest during this hospitalization showed masslike consolidation within the left upper lobe abutting the left hilar concerning for central obstructing neoplasm.  Peripheral areas of consolidation interlobar septal thickening throughout the left upper lobe concerning for combination of postobstructive changes, regional metastatic disease and lymphangitic spread of tumor. -Patient currently on IV Zosyn due to concern for postobstructive pneumonia. -Will narrow from IV Zosyn to IV Unasyn to complete a 14-day course of antibiotic treatment. -Patient's presenting symptoms of respiratory distress likely secondary to mechanical obstruction with postobstructive pneumonia. -Patient seen by PCCM and recommended consultation with oncology and radiation oncology early on in the hospitalization. -Patient subsequently transferred [facility]. -Oncology is currently following and concern for adenocarcinoma of the lung, oncology also concern about findings of PET scan with hypermetabolic activity in the rectum and the presence of elevated CEA and recommending GI evaluation.. -Patient seen by radiation oncology and underwent simulation and first treatment 09/01/2022.  Patient underwent another radiation treatment 09/04/2022. -Radiation oncology recommended to continue first few treatments of radiation while in-house and if and when patient better and stable could complete a 2-week course in the outpatient setting. -Due to large left pleural effusion patient seen by PCCM underwent thoracentesis as stated above with 800 cc of fluid removed.  Cytology pending. -Chest x-ray done this morning with reaccumulation of fluid and patient for probable pigtail catheter with talc pleurodesis today per PCCM. -Patient seen by oncology Port-A-Cath placement recommended which was done 09/04/2022 per IR, patient to be started on low-dose chemotherapy per oncology  tomorrow.  -Appreciate oncology and radiation oncology input and recommendations.  2.  Hypokalemia -Potassium at 3.1. -K-Dur 40 mEq p.o. every 4 hours x 2 doses.  3.  Small acute infarct left frontal white matter/prior history of CVA -MRI done 09/02/2022 for staging with small acute infarct in the left frontal white matter.  Chronic small vessel ischemia with multiple chronic lacunar infarcts.  Multiple calvarial metastases, largest measuring 2 cm in the right parietal bone with transcortical growth.  Negative for brain metastases. -Patient noted out to have been on Eliquis throughout the hospitalization and subsequently transition to heparin as patient noted to be scheduled for flexible sigmoidoscopy to be done 09/03/2022 however canceled as patient noted to go into A-fib with RVR.  -Stroke workup underway. -CT angiogram head and neck done with no LVO.  Multiple,.  Metastases largest located in the right parietal bone with transcortical extension, masslike consolidation in the left upper lobe as characterized on recent chest CT, bilateral pleural effusions. -Patient with recent 2D echo and as such 2D echo not obtained.  -Patient noted to have passed bedside swallow evaluation.  -Fasting lipid panel with an LDL of 51, total cholesterol 114. -SLP, PT, OT. -Hemoglobin A1c noted at 6.3 on 07/24/2022. -Continue statin. -Currently on heparin drip. -Neurology consulted and recommended continuation of heparin drip and may switch back to Eliquis versus Lovenox when no further procedures are planned. -Choice of Eliquis versus Lovenox will be deferred to hematology/oncology. -Appreciate neurology input and recommendations.  4 large exudative left pleural effusion -Noted on chest x-ray. -Patient admitted with acute on chronic respiratory failure with concerns for right upper lobe mass and concern for postobstructive pneumonia. -Patient seen in consultation by PCCM, patient underwent thoracentesis with 800  cc  of dark serous pleural fluid obtained, cultures with no growth to date so far.  By lights criteria likely exudate.  Cytology pending. -PCCM following and feels likely a malignant pleural effusion recommending intermittent checks x-rays and if reaccumulation occurs we will probably try pigtail with tac pleurodesis rather than Pleurx to prevent difficult outpatient follow-up. -Chest x-ray with reaccumulation of pleural fluid and patient for pigtail with tach pleurodesis to be done today.  -Patient also receiving radiation treatments and likely to start chemotherapy tomorrow per oncology. -PCCM following and appreciate input and recommendations.  5.  Hypomagnesemia/hypophosphatemia/hypocalcemia -Magnesium repleted currently at 2.3.   -Phosphorus at 2.2, will give K-Phos 500 twice daily x 3 days.   -Hypocalcemia, corrected calcium of 8.1.   -Check PTH, intact calcium, vitamin D. -Due to A-fib with RVR, will give calcium gluconate 1 g IV x 1.   -Continue oral magnesium supplementation. -Repeat labs in the AM.  6.  Hypertension -Blood pressure controlled on Cardizem drip, Lasix, labetalol, Avapro.   -Blood pressure somewhat soft this morning.   -Decrease Avapro to daily.   7.  Tobacco use -Tobacco cessation already done per prior MD.  8.  Type 2 diabetes mellitus without complications -Hemoglobin A1c noted at 6.3 on 07/24/2022. -CBG 142 this morning.  -SSI.    9.  Paroxysmal atrial fibrillation with RVR -Patient noted to have been on Cardizem and labetalol for rate control.   -Was on Eliquis and subsequently transitioned to heparin in anticipation of possible flexible sigmoidoscopy.   -Patient noted to go into A-fib with RVR the evening of 09/02/2022, with some associated shortness of breath and was on Cardizem drip.   -Patient noted to have converted to normal sinus rhythm at 3 AM 09/03/2022, was initially transitioned off Cardizem drip however went back into A-fib with RVR with heart rate  sustained in the 150s and had to be placed back on the Cardizem drip.   -Patient was off and on Cardizem drip yesterday and noted to have converted back to normal sinus rhythm around 3:29 AM.   -Cardizem drip is being weaned off.   -Continue oral Cardizem 60 mg every 8 hours in addition to labetalol 100 mg twice daily for rate control. -Heparin resumed as flex sigmoidoscopy has been canceled.   -Continue heparin until no further procedures are planned during this hospitalization.  10.  Chronic anxiety -Continue Cymbalta.  11.  Abnormal PET scan -Patient noted to have an abnormal PET scan November 2023 that showed hypermetabolic activity in the rectum without any discrete CT correlation possibly reflecting physiologic activity. -Oncology recommending GI evaluation. -CEA elevated at 1408 -Patient seen in consultation by GI 09/02/2022 who are recommending a flexible sigmoidoscopy which was to be done today for further evaluation of abnormal finding noted on PET scan concerning for metabolic Activity in the rectum, however due to patient's A-fib the evening of 09/02/2022 and on Cardizem drip, anesthesia recommended procedure be rescheduled. -Patient converted to normal sinus rhythm this morning and has been transition to oral Cardizem and labetalol. -Per GI.  12.  Hyponatremia -Sodium levels this morning at 126. -Possibly likely a component of SIADH due to concern for obstructive lung cancer versus hypovolemic hyponatremia. -Serum osmolality of 277, urine osmolality pending.  Urine sodium and urine creatinine pending.   -Slight improvement in hyponatremia with gentle hydration.   -Continue IV fluids for another 24 hours pending urine studies.    DVT prophylaxis: Heparin drip. Code Status: Full Family Communication: Updated patient.  Updated daughter  at bedside.  Disposition: Remain in stepdown unit.   Status is: Inpatient Remains inpatient appropriate because: Severity of illness    Consultants:  Hematology/oncology: Dr. Marin Olp 08/31/2022 Gastroenterology: Dr. Alessandra Bevels 09/02/2022 Radiation oncology: Dr. Tammi Klippel 08/31/2022 PCCM: Dr.Olalere 08/29/2022 Neurology: [place] 09/03/2022 Interventional radiology  Procedures:  CT chest 08/29/2022 Chest x-ray 08/29/2022, 09/03/2022 MRI brain 09/02/2022 Bone scan 09/01/2022 Thoracentesis per PCCM, Dr.Olalere 09/03/2022 Port-A-Cath placement 09/04/2022  Antimicrobials:  Anti-infectives (From admission, onward)    Start     Dose/Rate Route Frequency Ordered Stop   08/31/22 2200  piperacillin-tazobactam (ZOSYN) IVPB 3.375 g        3.375 g 12.5 mL/hr over 240 Minutes Intravenous Every 8 hours 08/31/22 1840     08/29/22 2315  piperacillin-tazobactam (ZOSYN) IVPB 3.375 g  Status:  Discontinued        3.375 g 12.5 mL/hr over 240 Minutes Intravenous Every 8 hours 08/29/22 2134 08/31/22 1540         Subjective: Laying in bed.  Daughter at bedside cutting out patient's breakfast.  Patient denies any chest pain.  Denies any significant worsening shortness of breath.  No abdominal pain.    Objective: Vitals:   09/05/22 0700 09/05/22 0800 09/05/22 0857 09/05/22 0900  BP: 105/68 (!) 112/54  (!) 117/59  Pulse:    75  Resp: (!) 21 (!) 28  (!) 22  Temp:  98 F (36.7 C)    TempSrc:  Oral    SpO2:   93% 94%  Weight:      Height:        Intake/Output Summary (Last 24 hours) at 09/05/2022 0930 Last data filed at 09/05/2022 [id number] Gross per 24 hour  Intake 2983.57 ml  Output 450 ml  Net 2533.57 ml   Filed Weights   09/02/22 0526 09/03/22 1900 09/04/22 0600  Weight: 65.4 kg 65.4 kg 66.5 kg    Examination:  General exam: NAD.  On 6 L high flow nasal cannula. Respiratory system: Decreased breath sounds in the left base.  No wheezing.  Fair air movement.  Speaking in full sentences.  On 6 L high flow nasal cannula with sats of 94 to 96%. Cardiovascular system: RRR no murmurs rubs or gallops.  No JVD.  No lower extremity  edema.  Gastrointestinal system: Abdomen is soft, nontender, nondistended, positive bowel sounds.  No rebound.  No guarding.  Central nervous system: Alert and oriented. No focal neurological deficits. Extremities: Symmetric 5 x 5 power. Skin: No rashes, lesions or ulcers Psychiatry: Judgement and insight appear normal. Mood & affect appropriate.     Data Reviewed: I have personally reviewed following labs and imaging studies  CBC: Recent Labs  Lab 08/29/22 1400 08/30/22 0337 09/01/22 0545 09/02/22 0524 09/03/22 0351 09/04/22 0614 09/05/22 0313  WBC 12.5*   < > 10.7* 9.9 11.0* 12.2* 9.4  NEUTROABS 9.9*  --   --   --  9.4* 10.8*  --   HGB 12.2   < > 10.8* 11.0* 11.1* 10.1* 9.3*  HCT 38.2   < > 34.0* 33.5* 35.3* 31.2* 29.5*  MCV 79.4*   < > 78.2* 77.2* 77.6* 77.2* 79.9*  PLT 575*   < > 426* 465* 456* 408* 383   < > = values in this interval not displayed.    Basic Metabolic Panel: Recent Labs  Lab 08/31/22 0030 09/01/22 0545 09/02/22 0524 09/03/22 0351 09/04/22 0614 09/05/22 0313  NA 135 136 131* 132* 126* 127*  K 4.0 3.6 3.3* 3.6 3.5 3.1*  CL 100 94* 94* 94* 93* 98  CO2 26 30 29 26 25 23   GLUCOSE 129* 145* 141* 177* 131* 135*  BUN 6* 10 14 13 14 18   CREATININE 0.62 0.57 0.67 0.79 0.75 0.81  CALCIUM 9.1 9.2 8.9 8.8* 7.5* 6.9*  MG 1.6* 2.0  --  2.0 2.0 2.3  PHOS  --   --   --   --  3.2 2.2*    GFR: Estimated Creatinine Clearance: 59.7 mL/min (by C-G formula based on SCr of 0.81 mg/dL).  Liver Function Tests: Recent Labs  Lab 08/29/22 1400 09/02/22 0524 09/04/22 0614 09/05/22 0313  AST 18 13* 19 18  ALT 12 13 17 14   ALKPHOS 97 95 103 97  BILITOT 0.7 1.2 0.8 0.7  PROT 7.1 6.9 6.3* 5.9*  ALBUMIN 3.2* 3.0* 2.7* 2.5*    CBG: Recent Labs  Lab 09/04/22 0736 09/04/22 1145 09/04/22 1812 09/04/22 2116 09/05/22 0758  GLUCAP 155* 152* 139* 154* 142*     Recent Results (from the past 240 hour(s))  Resp panel by RT-PCR (RSV, Flu A&B, Covid) Anterior  Nasal Swab     Status: None   Collection Time: 08/29/22  2:07 PM   Specimen: Anterior Nasal Swab  Result Value Ref Range Status   SARS Coronavirus 2 by RT PCR NEGATIVE NEGATIVE Final   Influenza A by PCR NEGATIVE NEGATIVE Final   Influenza B by PCR NEGATIVE NEGATIVE Final    Comment: (NOTE) The Xpert Xpress SARS-CoV-2/FLU/RSV plus assay is intended as an aid in the diagnosis of influenza from Nasopharyngeal swab specimens and should not be used as a sole basis for treatment. Nasal washings and aspirates are unacceptable for Xpert Xpress SARS-CoV-2/FLU/RSV testing.  Fact Sheet for Patients: EntrepreneurPulse.com.au  Fact Sheet for Healthcare Providers: IncredibleEmployment.be  This test is not yet approved or cleared by the [place] FDA and has been authorized for detection and/or diagnosis of SARS-CoV-2 by FDA under an Emergency Use Authorization (EUA). This EUA will remain in effect (meaning this test can be used) for the duration of the COVID-19 declaration under Section 564(b)(1) of the Act, 21 U.S.C. section 360bbb-3(b)(1), unless the authorization is terminated or revoked.     Resp Syncytial Virus by PCR NEGATIVE NEGATIVE Final    Comment: (NOTE) Fact Sheet for Patients: EntrepreneurPulse.com.au  Fact Sheet for Healthcare Providers: IncredibleEmployment.be  This test is not yet approved or cleared by the [place] FDA and has been authorized for detection and/or diagnosis of SARS-CoV-2 by FDA under an Emergency Use Authorization (EUA). This EUA will remain in effect (meaning this test can be used) for the duration of the COVID-19 declaration under Section 564(b)(1) of the Act, 21 U.S.C. section 360bbb-3(b)(1), unless the authorization is terminated or revoked.  Performed at [facility], [place] [address] [place], [place] [address]   Expectorated Sputum Assessment w Gram Stain, Rflx  to Resp Cult     Status: None   Collection Time: 08/29/22  9:22 PM   Specimen: Expectorated Sputum  Result Value Ref Range Status   Specimen Description EXPECTORATED SPUTUM  Final   Special Requests NONE  Final   Sputum evaluation   Final    THIS SPECIMEN IS ACCEPTABLE FOR SPUTUM CULTURE Performed at [facility], [place] [address], [place], [place] [address]    Report Status 09/05/2022 FINAL  Final  Culture, Respiratory w Gram Stain     Status: None   Collection Time: 08/29/22  9:22 PM  Result  Value Ref Range Status   Specimen Description EXPECTORATED SPUTUM  Final   Special Requests NONE Reflexed from [id number]  Final   Gram Stain   Final    RARE WBC PRESENT, PREDOMINANTLY MONONUCLEAR NO ORGANISMS SEEN    Culture   Final    RARE Normal respiratory flora-no Staph aureus or Pseudomonas seen Performed at [facility], [address], [place], [place] [address]    Report Status 09/03/2022 FINAL  Final  Body fluid culture w Gram Stain     Status: None (Preliminary result)   Collection Time: 09/03/22  3:03 PM   Specimen: Pleural Fluid  Result Value Ref Range Status   Specimen Description   Final    PLEURAL Performed at [place] [facility] [address], [place], [place] [address]    Special Requests   Final    NONE Performed at [facility], [place] [address] [place], [place] [address]    Gram Stain   Final    RARE WBC PRESENT, PREDOMINANTLY PMN NO ORGANISMS SEEN    Culture   Final    NO GROWTH < 24 HOURS Performed at [place] [address], [place], [place] [address]    Report Status PENDING  Incomplete  MRSA Next Gen by PCR, Nasal     Status: None   Collection Time: 09/03/22  7:39 PM   Specimen: Nasal Mucosa; Nasal Swab  Result Value Ref Range Status   MRSA by PCR Next Gen NOT DETECTED NOT DETECTED Final    Comment: (NOTE) The GeneXpert MRSA Assay (FDA approved for NASAL specimens only), is one component of a  comprehensive MRSA colonization surveillance program. It is not intended to diagnose MRSA infection nor to guide or monitor treatment for MRSA infections. Test performance is not FDA approved in patients less than 19 years old. Performed at [facility], [place] [address], [place],  [address]          Radiology Studies: DG Chest 1 View  Result Date: 09/05/2022 CLINICAL DATA:  Pleural effusion EXAM: CHEST  1 VIEW COMPARISON:  09/03/2022. FINDINGS: Increasing opacity left hemithorax. Right perihilar masslike opacity. Pulmonary vascular congestion. No pneumothorax identified. Right-sided Port-A-Cath tip mid SVC. Calcified aorta. IMPRESSION: Increasing left hemithorax opacification. Right perihilar masslike density. Electronically Signed   By: Sammie Bench M.D.   On: 09/05/2022 06:52   IR IMAGING GUIDED PORT INSERTION  Result Date: 09/04/2022 INDICATION: 72 year old with metastatic lung cancer. Port-A-Cath needed to assist with patient's treatment. EXAM: FLUOROSCOPIC AND ULTRASOUND GUIDED PLACEMENT OF A SUBCUTANEOUS PORT COMPARISON:  None Available. MEDICATIONS: Moderate sedation ANESTHESIA/SEDATION: Moderate (conscious) sedation was employed during this procedure. A total of Versed 0.5 mg and fentanyl 25 mcg was administered intravenously at the order of the provider performing the procedure. Total intra-service moderate sedation time: 24 minutes. Patient's level of consciousness and vital signs were monitored continuously by radiology nurse throughout the procedure under the supervision of the provider performing the procedure. FLUOROSCOPY TIME:  Radiation Exposure Index (as provided by the fluoroscopic device): 1 mGy Kerma COMPLICATIONS: None immediate. PROCEDURE: The procedure, risks, benefits, and alternatives were explained to the patient/family. Questions regarding the procedure were encouraged and answered. Informed consent was obtained for the procedure. Patient  was placed supine on the interventional table. Ultrasound confirmed a patent right internal jugular vein. Ultrasound image was saved for documentation. The right chest and neck were cleaned with a skin antiseptic and a sterile drape was placed.  Maximal barrier sterile technique was utilized including caps, mask, sterile gowns, sterile gloves, sterile drape, hand hygiene and skin antiseptic. The right neck was anesthetized with 1% lidocaine. Small incision was made in the right neck with a blade. Micropuncture set was placed in the right internal jugular vein with ultrasound guidance. The micropuncture wire was used for measurement purposes. The right chest was anesthetized with 1% lidocaine with epinephrine. #15 blade was used to make an incision and a subcutaneous port pocket was formed. [place] was assembled. Subcutaneous tunnel was formed with a stiff tunneling device. The port catheter was brought through the subcutaneous tunnel. The port was placed in the subcutaneous pocket. The micropuncture set was exchanged for a peel-away sheath. The catheter was placed through the peel-away sheath and the tip was positioned at the superior cavoatrial junction. Catheter placement was confirmed with fluoroscopy. The port was accessed and flushed with saline. The port pocket was closed using two layers of absorbable sutures and Dermabond. The vein skin site was closed using a single layer of absorbable suture and Dermabond. Sterile dressings were applied. Patient tolerated the procedure well without an immediate complication. Ultrasound and fluoroscopic images were taken and saved for this procedure. IMPRESSION: Placement of a subcutaneous power-injectable port device. Catheter tip at the superior cavoatrial junction. Electronically Signed   By: Markus Daft M.D.   On: 09/04/2022 17:45   DG CHEST PORT 1 VIEW  Result Date: 09/03/2022 CLINICAL DATA:  Post thoracentesis EXAM: PORTABLE CHEST 1 VIEW COMPARISON:   09/03/2022 FINDINGS: Decreasing left effusion following thoracentesis. Airspace disease in the left upper lobe and left lower lobe. Heart is borderline in size. Aortic atherosclerosis. Prominent hila bilaterally shown on prior CT to reflect dilated pulmonary arteries compatible with pulmonary arterial hypertension. No pneumothorax. IMPRESSION: Decreasing left effusion following thoracentesis.  No pneumothorax. Left upper lobe and left lower lobe airspace disease concerning for pneumonia. Electronically Signed   By: Rolm Baptise M.D.   On: 09/03/2022 20:08   DG CHEST PORT 1 VIEW  Result Date: 09/03/2022 CLINICAL DATA:  Shortness of breath EXAM: PORTABLE CHEST 1 VIEW COMPARISON:  09/03/2022 FINDINGS: Heart upper limits normal in size. Aortic atherosclerosis. Bilateral hilar fullness shown on prior CT to reflect dilated pulmonary arteries compatible with pulmonary arterial hypertension. Left perihilar and lower lobe airspace disease, similar to earlier study. No confluent opacity on the right. No visible effusions or acute bony abnormality. IMPRESSION: Left perihilar and lower lobe airspace disease, similar prior study. Borderline heart size. Dilated pulmonary arteries compatible with pulmonary arterial hypertension. No significant change. Electronically Signed   By: Rolm Baptise M.D.   On: 09/03/2022 20:07   DG CHEST PORT 1 VIEW  Result Date: 09/03/2022 CLINICAL DATA:  Shortness of breath EXAM: PORTABLE CHEST 1 VIEW COMPARISON:  08/29/2022 FINDINGS: Cardiac shadow is stable but obscured by enlarging left-sided pleural effusion and left opacity. Right lung remains clear. No bony abnormality is noted. IMPRESSION: Significant increased opacity in the left hemithorax consistent with new pleural effusion and likely increasing consolidation. These results will be called to the ordering clinician or representative by the Radiologist Assistant, and communication documented in the PACS or [employer].  Electronically Signed   By: Inez Catalina M.D.   On: 09/03/2022 12:00        Scheduled Meds:  atorvastatin  40 mg Oral Daily   Chlorhexidine Gluconate Cloth  6 each Topical Daily   diltiazem  60 mg Oral Q8H   dronabinol  2.5  mg Oral BID AC   DULoxetine  60 mg Oral BID   fluticasone  2 spray Each Nare Daily   furosemide  20 mg Oral Daily   insulin aspart  0-6 Units Subcutaneous TID WC   irbesartan  150 mg Oral BID   labetalol  100 mg Oral BID   magnesium oxide  400 mg Oral BID   mometasone-formoterol  2 puff Inhalation BID   pantoprazole  40 mg Oral Daily   phosphorus  500 mg Oral BID   potassium chloride  40 mEq Oral Q4H   Continuous Infusions:  sodium chloride 100 mL/hr at 09/05/22 [id number]   calcium gluconate 1,000 mg (09/05/22 0853)   diltiazem (CARDIZEM) infusion Stopped (09/05/22 0600)   heparin 800 Units/hr (09/05/22 0821)   piperacillin-tazobactam 12.5 mL/hr at 09/05/22 0821     LOS: 7 days    Time spent: 50 minutes    Irine Seal, MD [facility]   To contact the attending provider between 7A-7P or the covering provider during after hours 7P-7A, please log into the web site [email or web address] and access using universal [place] password for that web site. If you do not have the password, please call the hospital operator.  09/05/2022, 9:30 AM

## 2022-09-05 NOTE — Evaluation (Signed)
SLP Cancellation Note  Patient Details Name: Rhonda Blackwell MRN: [id number] DOB: June 09, 1951   Cancelled treatment:       Reason Eval/Treat Not Completed: Other (comment);Medical issues which prohibited therapy (pt currently on Bipap for respiratory support, will continue efforts for speech/language/cognitive evaluation)  Kathleen Lime, MS [facility] SLP Acute Rehab Services Office [phone]  Macario Golds 09/05/2022, 12:11 PM

## 2022-09-05 NOTE — Progress Notes (Signed)
ANTICOAGULATION CONSULT NOTE - Follow Up Consult  Pharmacy Consult for Heparin Indication: atrial fibrillation (apixaban on hold)  Allergies  Allergen Reactions   Bystolic [Nebivolol Hcl] Nausea Only and Other (See Comments)    Abdominal pain   Hydrochlorothiazide Nausea Only and Swelling    Leg swelling   Norvasc [Amlodipine] Swelling    Leg swelling    Patient Measurements: Height: 5\' 4"  (162.6 cm) Weight: 66.5 kg (146 lb 9.7 oz) IBW/kg (Calculated) : 54.7 Heparin Dosing Weight: TBW  Vital Signs: Temp: 98.1 F (36.7 C) (03/18 2109) Temp Source: Oral (03/18 2109) BP: 108/57 (03/19 0330) Pulse Rate: 73 (03/19 0330)  Labs: Recent Labs    09/03/22 0351 09/03/22 1804 09/03/22 1929 09/04/22 0614 09/05/22 0313  HGB 11.1*  --   --  10.1* 9.3*  HCT 35.3*  --   --  31.2* 29.5*  PLT 456*  --   --  408* 383  APTT  --    < > 105* 59* 66*  HEPARINUNFRC >1.10*  --   --  0.89* 0.40  CREATININE 0.79  --   --  0.75 0.81   < > = values in this interval not displayed.     Estimated Creatinine Clearance: 59.7 mL/min (by C-G formula based on SCr of 0.81 mg/dL).   Medications:  Infusions:   sodium chloride 100 mL/hr at 09/05/22 0330   diltiazem (CARDIZEM) infusion 5 mg/hr (09/05/22 0330)   heparin 800 Units/hr (09/05/22 0330)   piperacillin-tazobactam Stopped (09/05/22 0232)    Assessment: 28 yoF admitted on 3/12 with history of afib on Eliquis, which was continued through 3/15 2100, then held in anticipation of port placement. 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  GI was planning flexible sigmoidoscopy 3/17 to investigate abnormal PET scan findings, but was delayed d/t clinical instability.  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  Pharmacy consulted for heparin management by Neurology for atrial fibrillation - neuro scale.   Significant Events: 3/17: heparin drip stopped at 0400 for planned flex sig; procedure  cancelled due to change in cardiac and respiratory status. Heparin drip restarted at 0930   Today, 09/05/2022:  aPTT 66, therapeutic on heparin 800 units/hr Heparin level at 0.4, therapeutic    CBC: Hgb decreased to 9.3, Plts wnl No complications of therapy noted  Goal of Therapy:  Heparin level 0.3-0.5 units/ml aPTT 66-85 seconds Monitor platelets by anticoagulation protocol: Yes   Plan:  Continue heparin IV infusion 800 units/hr As aPTT and HL now correlate, will continue monitoring heparin therapy with HL only Repeat heparin level in 8 hr to confirm therapeutic dose -Daily heparin level, CBC while on heparin -Follow up plans for potential reschedule of GI procedure   Leone Haven, PharmD 09/05/2022 4:12 AM

## 2022-09-05 NOTE — Progress Notes (Signed)
Subjective: No complaints.  Objective: Vital signs in last 24 hours: Temp:  [97.3 F (36.3 C)-98.1 F (36.7 C)] 97.3 F (36.3 C) (03/19 1200) Pulse Rate:  [66-125] 78 (03/19 1500) Resp:  [17-31] 31 (03/19 1500) BP: (88-149)/(44-120) 122/50 (03/19 1500) SpO2:  [90 %-97 %] 95 % (03/19 1500) FiO2 (%):  [40 %] 40 % (03/19 1150) Last BM Date : 09/04/22  Intake/Output from previous day: 03/18 0701 - 03/19 0700 In: 2755 [I.V.:2251.5; IV Piggyback:503.5] Out: -  Intake/Output this shift: Total I/O In: 1107.8 [I.V.:1078.5; IV Piggyback:29.4] Out: 450 [Urine:450]  General appearance: Fatigued with some work of breathing  Lab Results: Recent Labs    09/03/22 0351 09/04/22 0614 09/05/22 0313  WBC 11.0* 12.2* 9.4  HGB 11.1* 10.1* 9.3*  HCT 35.3* 31.2* 29.5*  PLT 456* 408* 383   BMET Recent Labs    09/03/22 0351 09/04/22 0614 09/05/22 0313  NA 132* 126* 127*  K 3.6 3.5 3.1*  CL 94* 93* 98  CO2 26 25 23   GLUCOSE 177* 131* 135*  BUN 13 14 18   CREATININE 0.79 0.75 0.81  CALCIUM 8.8* 7.5* 6.9*   LFT Recent Labs    09/05/22 0313  PROT 5.9*  ALBUMIN 2.5*  AST 18  ALT 14  ALKPHOS 97  BILITOT 0.7   PT/INR No results for input(s): "LABPROT", "INR" in the last 72 hours. Hepatitis Panel No results for input(s): "HEPBSAG", "HCVAB", "HEPAIGM", "HEPBIGM" in the last 72 hours. C-Diff No results for input(s): "CDIFFTOX" in the last 72 hours. Fecal Lactopherrin No results for input(s): "FECLLACTOFRN" in the last 72 hours.  Studies/Results: DG CHEST PORT 1 VIEW  Result Date: 09/05/2022 CLINICAL DATA:  Status post tube placement. EXAM: PORTABLE CHEST 1 VIEW COMPARISON:  Same day. FINDINGS: Stable cardiomediastinal silhouette. Right internal jugular Port-A-Cath is unchanged. Interval placement of left-sided chest tube. Left pleural effusion appears to be nearly resolved. Left perihilar infiltrate or mass is noted. IMPRESSION: Interval placement of left-sided chest tube. Left  pleural effusion is nearly resolved. No definite pneumothorax is noted. Left perihilar infiltrate or mass is noted. Electronically Signed   By: Marijo Conception M.D.   On: 09/05/2022 12:16   DG Chest 1 View  Result Date: 09/05/2022 CLINICAL DATA:  Pleural effusion EXAM: CHEST  1 VIEW COMPARISON:  09/03/2022. FINDINGS: Increasing opacity left hemithorax. Right perihilar masslike opacity. Pulmonary vascular congestion. No pneumothorax identified. Right-sided Port-A-Cath tip mid SVC. Calcified aorta. IMPRESSION: Increasing left hemithorax opacification. Right perihilar masslike density. Electronically Signed   By: Sammie Bench M.D.   On: 09/05/2022 06:52   IR IMAGING GUIDED PORT INSERTION  Result Date: 09/04/2022 INDICATION: 72 year old with metastatic lung cancer. Port-A-Cath needed to assist with patient's treatment. EXAM: FLUOROSCOPIC AND ULTRASOUND GUIDED PLACEMENT OF A SUBCUTANEOUS PORT COMPARISON:  None Available. MEDICATIONS: Moderate sedation ANESTHESIA/SEDATION: Moderate (conscious) sedation was employed during this procedure. A total of Versed 0.5 mg and fentanyl 25 mcg was administered intravenously at the order of the provider performing the procedure. Total intra-service moderate sedation time: 24 minutes. Patient's level of consciousness and vital signs were monitored continuously by radiology nurse throughout the procedure under the supervision of the provider performing the procedure. FLUOROSCOPY TIME:  Radiation Exposure Index (as provided by the fluoroscopic device): 1 mGy Kerma COMPLICATIONS: None immediate. PROCEDURE: The procedure, risks, benefits, and alternatives were explained to the patient/family. Questions regarding the procedure were encouraged and answered. Informed consent was obtained for the procedure. Patient was placed supine on the interventional table. Ultrasound  confirmed a patent right internal jugular vein. Ultrasound image was saved for documentation. The right chest  and neck were cleaned with a skin antiseptic and a sterile drape was placed. Maximal barrier sterile technique was utilized including caps, mask, sterile gowns, sterile gloves, sterile drape, hand hygiene and skin antiseptic. The right neck was anesthetized with 1% lidocaine. Small incision was made in the right neck with a blade. Micropuncture set was placed in the right internal jugular vein with ultrasound guidance. The micropuncture wire was used for measurement purposes. The right chest was anesthetized with 1% lidocaine with epinephrine. #15 blade was used to make an incision and a subcutaneous port pocket was formed. [place] was assembled. Subcutaneous tunnel was formed with a stiff tunneling device. The port catheter was brought through the subcutaneous tunnel. The port was placed in the subcutaneous pocket. The micropuncture set was exchanged for a peel-away sheath. The catheter was placed through the peel-away sheath and the tip was positioned at the superior cavoatrial junction. Catheter placement was confirmed with fluoroscopy. The port was accessed and flushed with saline. The port pocket was closed using two layers of absorbable sutures and Dermabond. The vein skin site was closed using a single layer of absorbable suture and Dermabond. Sterile dressings were applied. Patient tolerated the procedure well without an immediate complication. Ultrasound and fluoroscopic images were taken and saved for this procedure. IMPRESSION: Placement of a subcutaneous power-injectable port device. Catheter tip at the superior cavoatrial junction. Electronically Signed   By: Markus Daft M.D.   On: 09/04/2022 17:45   DG CHEST PORT 1 VIEW  Result Date: 09/03/2022 CLINICAL DATA:  Post thoracentesis EXAM: PORTABLE CHEST 1 VIEW COMPARISON:  09/03/2022 FINDINGS: Decreasing left effusion following thoracentesis. Airspace disease in the left upper lobe and left lower lobe. Heart is borderline in size. Aortic  atherosclerosis. Prominent hila bilaterally shown on prior CT to reflect dilated pulmonary arteries compatible with pulmonary arterial hypertension. No pneumothorax. IMPRESSION: Decreasing left effusion following thoracentesis.  No pneumothorax. Left upper lobe and left lower lobe airspace disease concerning for pneumonia. Electronically Signed   By: Rolm Baptise M.D.   On: 09/03/2022 20:08   DG CHEST PORT 1 VIEW  Result Date: 09/03/2022 CLINICAL DATA:  Shortness of breath EXAM: PORTABLE CHEST 1 VIEW COMPARISON:  09/03/2022 FINDINGS: Heart upper limits normal in size. Aortic atherosclerosis. Bilateral hilar fullness shown on prior CT to reflect dilated pulmonary arteries compatible with pulmonary arterial hypertension. Left perihilar and lower lobe airspace disease, similar to earlier study. No confluent opacity on the right. No visible effusions or acute bony abnormality. IMPRESSION: Left perihilar and lower lobe airspace disease, similar prior study. Borderline heart size. Dilated pulmonary arteries compatible with pulmonary arterial hypertension. No significant change. Electronically Signed   By: Rolm Baptise M.D.   On: 09/03/2022 20:07    Medications: Scheduled:  atorvastatin  40 mg Oral Daily   Chlorhexidine Gluconate Cloth  6 each Topical Daily   diltiazem  60 mg Oral Q8H   [START ON 09/06/2022] diphenhydrAMINE  50 mg Intravenous Once   dronabinol  2.5 mg Oral BID AC   DULoxetine  60 mg Oral BID   fluticasone  2 spray Each Nare Daily   furosemide  20 mg Oral Daily   insulin aspart  0-6 Units Subcutaneous TID WC   irbesartan  150 mg Oral BID   labetalol  100 mg Oral BID   magnesium oxide  400 mg Oral BID   mometasone-formoterol  2 puff Inhalation BID   [START ON 09/06/2022] palonosetron  0.25 mg Intravenous Once   pantoprazole  40 mg Oral Daily   phosphorus  500 mg Oral BID   potassium chloride  40 mEq Oral Q4H   sodium chloride flush  10 mL Intrapleural Q8H   Continuous:  sodium  chloride 100 mL/hr at 09/05/22 1559   ampicillin-sulbactam (UNASYN) IV     [START ON 09/06/2022] dexamethasone (DECADRON) IVPB ([facility])     diltiazem (CARDIZEM) infusion Stopped (09/05/22 0600)   [START ON 09/06/2022] famotidine (PEPCID) IV (ONCOLOGY)     heparin Stopped (09/05/22 1525)    Assessment/Plan: 1) Abnormal PET scan. 2) Elevated CEA. 3) Respiratory compromise. 4) Afib. 5) Metastatic cancer - ? Lung primary.   The patient had a chest tube placed today and then she developed flash pulmonary edema.  Currently she is saturating at 98% on 6 liters of nasal cannula.  There is evidence of an increased work of breathing.  She is currently not stable enough for an endoscopic procedure.  LOS: 7 days   Betina Puckett D 09/05/2022, 4:07 PM

## 2022-09-05 NOTE — TOC Progression Note (Signed)
Transition of Care [facility]) - Progression Note    Patient Details  Name: Faria Cullen MRN: [id number] Date of Birth: 1950/08/19  Transition of Care [facility]) CM/SW Contact  Shade Flood, LCSW Phone Number: 09/05/2022, 10:05 AM  Clinical Narrative:     TOC following. Pt is pending chest tube placement and radiation treatment. Will follow for dc planning when pt dc timeframe identified.    Barriers to Discharge: Continued Medical Work up  Expected Discharge Plan and Services                                               Social Determinants of Health (SDOH) Interventions SDOH Screenings   Food Insecurity: No Food Insecurity (08/30/2022)  Housing: Low Risk  (08/30/2022)  Transportation Needs: No Transportation Needs (08/30/2022)  Utilities: Not At Risk (08/30/2022)  Tobacco Use: High Risk (09/04/2022)    Readmission Risk Interventions    07/28/2022    2:35 PM  Readmission Risk Prevention Plan  Transportation Screening Complete  PCP or Specialist Appt within 5-7 Days Complete  Home Care Screening Complete  Medication Review (RN CM) Complete

## 2022-09-05 NOTE — Procedures (Signed)
Insertion of Chest Tube Procedure Note  Rhonda Blackwell  [id number]  01/18/1951  Date:09/05/22  Time:11:33 AM    Provider Performing: Clementeen Graham   Procedure: Chest Tube Insertion [phone])  Indication(s) Effusion  Consent Risks of the procedure as well as the alternatives and risks of each were explained to the patient and/or caregiver.  Consent for the procedure was obtained and is signed in the bedside chart  Anesthesia Topical only with 1% lidocaine    Time Out Verified patient identification, verified procedure, site/side was marked, verified correct patient position, special equipment/implants available, medications/allergies/relevant history reviewed, required imaging and test results available.   Sterile Technique Maximal sterile technique including full sterile barrier drape, hand hygiene, sterile gown, sterile gloves, mask, hair covering, sterile ultrasound probe cover (if used).   Procedure Description Ultrasound used to identify appropriate pleural anatomy for placement and overlying skin marked. Area of placement cleaned and draped in sterile fashion.  A 14 French pigtail pleural catheter was placed into the left pleural space using Seldinger technique. Appropriate return of fluid was obtained.  The tube was connected to atrium and placed on -20 cm H2O wall suction.   Complications/Tolerance None; patient tolerated the procedure well. Chest X-ray is ordered to verify placement.   EBL Minimal  Specimen(s) none  Erick Colace ACNP-BC [place] Pager # [phone] OR # [phone] if no answer

## 2022-09-06 ENCOUNTER — Inpatient Hospital Stay (HOSPITAL_COMMUNITY): Payer: Medicare Other

## 2022-09-06 ENCOUNTER — Other Ambulatory Visit: Payer: Self-pay

## 2022-09-06 ENCOUNTER — Ambulatory Visit
Admission: RE | Admit: 2022-09-06 | Discharge: 2022-09-06 | Discharge status: Home / Self Care | Payer: Medicare Other | Source: Ambulatory Visit | Attending: Radiation Oncology

## 2022-09-06 DIAGNOSIS — C3412 Malignant neoplasm of upper lobe, left bronchus or lung: Secondary | ICD-10-CM | POA: Diagnosis not present

## 2022-09-06 DIAGNOSIS — J9621 Acute and chronic respiratory failure with hypoxia: Secondary | ICD-10-CM | POA: Diagnosis not present

## 2022-09-06 DIAGNOSIS — C7951 Secondary malignant neoplasm of bone: Secondary | ICD-10-CM | POA: Diagnosis not present

## 2022-09-06 DIAGNOSIS — R590 Localized enlarged lymph nodes: Secondary | ICD-10-CM | POA: Diagnosis not present

## 2022-09-06 LAB — GLUCOSE, CAPILLARY
Glucose-Capillary: 117 mg/dL — ABNORMAL HIGH (ref 70–99)
Glucose-Capillary: 173 mg/dL — ABNORMAL HIGH (ref 70–99)
Glucose-Capillary: 139 mg/dL — ABNORMAL HIGH (ref 70–99)
Glucose-Capillary: 216 mg/dL — ABNORMAL HIGH (ref 70–99)

## 2022-09-06 LAB — CBC
HCT: 29.3 % — ABNORMAL LOW (ref 36.0–46.0)
Hemoglobin: 9.2 g/dL — ABNORMAL LOW (ref 12.0–15.0)
MCH: 25.2 pg — ABNORMAL LOW (ref 26.0–34.0)
MCHC: 31.4 g/dL (ref 30.0–36.0)
MCV: 80.3 fL (ref 80.0–100.0)
Platelets: 380 10*3/uL (ref 150–400)
RBC: 3.65 MIL/uL — ABNORMAL LOW (ref 3.87–5.11)
RDW: 14.1 % (ref 11.5–15.5)
WBC: 7.8 10*3/uL (ref 4.0–10.5)
nRBC: 0 % (ref 0.0–0.2)

## 2022-09-06 LAB — RAD ONC ARIA SESSION SUMMARY
Course Elapsed Days: 5
Plan Fractions Treated to Date: 3
Plan Prescribed Dose Per Fraction: 3 Gy
Plan Total Fractions Prescribed: 8
Plan Total Prescribed Dose: 24 Gy
Reference Point Dosage Given to Date: 9 Gy
Reference Point Session Dosage Given: 3 Gy
Session Number: 4

## 2022-09-06 LAB — BODY FLUID CULTURE W GRAM STAIN: Culture: NO GROWTH

## 2022-09-06 LAB — COMPREHENSIVE METABOLIC PANEL
ALT: 14 U/L (ref 0–44)
AST: 15 U/L (ref 15–41)
Albumin: 2.4 g/dL — ABNORMAL LOW (ref 3.5–5.0)
Alkaline Phosphatase: 88 U/L (ref 38–126)
Anion gap: 8 (ref 5–15)
BUN: 13 mg/dL (ref 8–23)
CO2: 21 mmol/L — ABNORMAL LOW (ref 22–32)
Calcium: 7.2 mg/dL — ABNORMAL LOW (ref 8.9–10.3)
Chloride: 103 mmol/L (ref 98–111)
Creatinine, Ser: 0.79 mg/dL (ref 0.44–1.00)
GFR, Estimated: >60 mL/min (ref >60)
Glucose, Bld: 131 mg/dL — ABNORMAL HIGH (ref 70–99)
Potassium: 4.2 mmol/L (ref 3.5–5.1)
Sodium: 132 mmol/L — ABNORMAL LOW (ref 135–145)
Total Bilirubin: 0.5 mg/dL (ref 0.3–1.2)
Total Protein: 6.2 g/dL — ABNORMAL LOW (ref 6.5–8.1)

## 2022-09-06 LAB — MAGNESIUM: Magnesium: 2.2 mg/dL (ref 1.7–2.4)

## 2022-09-06 LAB — PTH, INTACT AND CALCIUM
Calcium, Total (PTH): 7 mg/dL — ABNORMAL LOW (ref 8.7–10.3)
PTH: 33 pg/mL (ref 15–65)

## 2022-09-06 LAB — HEPARIN LEVEL (UNFRACTIONATED)
Heparin Unfractionated: 0.29 IU/mL — ABNORMAL LOW (ref 0.30–0.70)
Heparin Unfractionated: 0.38 IU/mL (ref 0.30–0.70)
Heparin Unfractionated: 0.28 IU/mL — ABNORMAL LOW (ref 0.30–0.70)

## 2022-09-06 LAB — TSH: TSH: 1.644 u[IU]/mL (ref 0.350–4.500)

## 2022-09-06 LAB — PHOSPHORUS: Phosphorus: 2.6 mg/dL (ref 2.5–4.6)

## 2022-09-06 MED ORDER — SODIUM CHLORIDE 0.9 % IV SOLN
50.0000 mg/m2 | Freq: Once | INTRAVENOUS | Status: AC
  Administered 2022-09-06: 84 mg via INTRAVENOUS
  Filled 2022-09-06: qty 14

## 2022-09-06 MED ORDER — HEPARIN (PORCINE) 25000 UT/250ML-% IV SOLN
1050.0000 [IU]/h | INTRAVENOUS | Status: DC
  Administered 2022-09-07: 1050 [IU]/h via INTRAVENOUS
  Filled 2022-09-06: qty 250

## 2022-09-06 MED ORDER — TALC (STERITALC) POWDER FOR INTRAPLEURAL USE: 4.0000 g | Freq: Once | INTRAPLEURAL | Status: DC

## 2022-09-06 MED ORDER — BISACODYL 5 MG PO TBEC
5.0000 mg | DELAYED_RELEASE_TABLET | Freq: Every day | ORAL | Status: DC | PRN
  Administered 2022-09-06: 5 mg via ORAL
  Filled 2022-09-06: qty 1

## 2022-09-06 MED ORDER — FUROSEMIDE 10 MG/ML IJ SOLN
40.0000 mg | Freq: Every day | INTRAMUSCULAR | Status: DC
  Administered 2022-09-06 – 2022-09-14 (×9): 40 mg via INTRAVENOUS
  Filled 2022-09-06 (×9): qty 4

## 2022-09-06 MED ORDER — TALC (STERITALC) POWDER FOR INTRAPLEURAL USE
4.0000 g | Freq: Once | INTRAPLEURAL | Status: AC
  Administered 2022-09-06: 4 g via INTRAPLEURAL
  Filled 2022-09-06: qty 4

## 2022-09-06 MED ORDER — SODIUM CHLORIDE 0.9 % IV SOLN
158.4000 mg | Freq: Once | INTRAVENOUS | Status: AC
  Administered 2022-09-06: 160 mg via INTRAVENOUS
  Filled 2022-09-06: qty 16

## 2022-09-06 MED ORDER — DEXMEDETOMIDINE HCL IN NACL 200 MCG/50ML IV SOLN
0.2000 ug/kg/h | INTRAVENOUS | Status: DC
  Administered 2022-09-06: 0.2 ug/kg/h via INTRAVENOUS
  Filled 2022-09-06 (×2): qty 50

## 2022-09-06 MED ORDER — LIDOCAINE HCL 1 % IJ SOLN: 10.0000 mL | Freq: Once | INTRAMUSCULAR | Status: DC

## 2022-09-06 MED ORDER — LIDOCAINE HCL 1 % IJ SOLN
25.0000 mL | Freq: Once | INTRAMUSCULAR | Status: AC
  Administered 2022-09-06: 25 mL via INTRAPLEURAL
  Filled 2022-09-06: qty 40

## 2022-09-06 NOTE — Progress Notes (Signed)
Patient received chemotherapy for the first time today. She received Taxol/ Carboplatin today. No issues with treatment. Patient tolerated it well. Chemo education was given to family members at bedside.

## 2022-09-06 NOTE — Progress Notes (Signed)
PROGRESS NOTE    Rhonda Blackwell  [id number] DOB: 09/17/1950 DOA: 08/29/2022 PCP: [name], MD   Brief Narrative: 72 year old with past medical history significant for COPD, diabetes type 2, CVA, hypertension, A-fib on Eliquis, metastatic disease initially with unknown primary presented to hospital with worsening shortness of breath.  Of note recent admission from 07/23/2022 1 to 07/27/2022 with sepsis secondary to pneumonia multifocal.  Discharged on 2 L of oxygen.  She was also recently diagnosed with A-fib.  Since her diagnosis she has been having shortness of breath and not feeling well.  She has a history of hypermetabolic left upper lobe nodule as well as hypermetabolic hilar and mediastinal lymph nodes, left scapular lesion, area of right acetabulum and rectum concerning for metastatic disease on PET scan.  He was followed by [facility] pulmonology/oncology, underwent biopsy of left scapular lesion 06/08/2022 and bronchoscopy EBUS on 07/04/2022, she did not follow-up with oncology and multiple appointments.  She presented to ED and was found to be A-fib RVR.  Developed worsening hypoxia requiring up to 8 L of high flow oxygen.  CT chest showed masslike consolidation in the left upper lobe abutting the left hilar concerning for central obstructing neoplasm. peripheral areas of consolidation and interlobar septal thickening throughout the left upper lobe concerning for combination of postobstructive changes, regional metastatic disease and lymphangitic spread of tumor. Mediastinal and hilar adenopathy consistent with nodule metastasis. 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   Patient underwent thoracentesis and subsequently tube placement left pleural space on 3/19.  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  -Acute on chronic respiratory failure likely multifactorial secondary to lung mass, postobstructive pneumonia, large left pleural effusion. -Patient oxygen requirement increased up to 15 L.  CCM consulted and underwent thoracentesis 3/17 yielding 800 cc of fluids. -CT chest showed masslike consolidation left upper lobe abutting left hilar concerning for central obstructing neoplasm. -On IV antibiotics to cover for postobstructive pneumonia. -Oncology following and concern is for adenocarcinoma of the lung. -Chest x-ray showed reaccumulation of pleural fluid.  Underwent left side pigtail catheter placement by CCM on 3/19. -Continue with IV lasix.   2-Metastatic cancer suspect primary Lung. Lung Mass. Left Pleural; effusion.  -Bone scan: Multifocal skeletal metastases involving the spine, ribs, left scapula and minimal involvement of the left hemipelvis. -CT head, neck: Multiple calvarial metastasis, largest located in the right parietal bone with transcortical extension. -Patient evaluated by radiation oncology, patient was a started on radiation treatments.  She will need to complete 2-week course of treatment -Dr. Marin Olp following. -Port cath placement 3/18 by IR -Plan to start chemotherapy 3/20 -Pleural fluid cytology  3-Hypokalemia;    4-Small acute  infarct left frontal white matter, prior recent history of CVA -MRI done on 09/02/2022 for staging show a small acute infarct in the left frontal white matter.  Chronic small vessel ischemia with multiple chronic lacunar infarct.  Multiple calvarial metastasis. -CT angio head and neck no large vessel occlusion. -Had a recent 2D echo.  This has not been repeated. -LDL: 51.  A1c 6.3. -Continue with the statins.  Currently on heparin drip.  Transition to Eliquis or Lovenox when no further procedures are planned.  5-A-fib RVR: On heparin drip. Intermittent episode of A-fib RVR.  Intermittently on Cardizem drip. On oral cardizem. Labetalol.   6-Hypomagnesemia, hypophosphatemia and hypocalcemia Replaced  Hypertension: On Cardizem, Avapro, labetalol.   Tobacco use: Cessation encouraged  Diabetes type 2: A1c 6.3 Sliding scale insulin  Chronic anxiety: Continue with Cymbalta  Hypermetabolic activity in the rectum seen on PET scan: CEA elevated 1400 Supportive for sigmoidoscopy.  Procedure canceled due to A-fib RVR and worsening hypoxia  Hyponatremia: Continue with IV Lasix     Estimated body mass index is 25.16 kg/m as calculated from the following:   Height as of this encounter: 5\' 4"  (1.626 m).   Weight as of this encounter: 66.5 kg.   DVT prophylaxis: Heparin  Code Status: Full code Family Communication: care discussed with patient.  Disposition Plan:  Status is: Inpatient Remains inpatient appropriate because: management of metastatic cancer, malignancy     Consultants:  Radiation oncology Dr Marin Olp CCM Dr Benson Norway , GI Neurology   Procedures:  CT chest 08/29/2022 Chest x-ray 08/29/2022, 09/03/2022 MRI brain 09/02/2022 Bone scan 09/01/2022 Thoracentesis per PCCM, Dr.Olalere 09/03/2022 Port-A-Cath placement 09/04/2022  Antimicrobials:  IV Unasyn   Subjective: She is alert, report breathing of, denies pain.    Objective: Vitals:   09/06/22 0500  09/06/22 0530 09/06/22 0600 09/06/22 0757  BP: 117/62  120/63   Pulse: 65 65 66   Resp: (!) 21 16 17    Temp:    98.4 F (36.9 C)  TempSrc:    Oral  SpO2: 98% 99% 98%   Weight:      Height:        Intake/Output Summary (Last 24 hours) at 09/06/2022 0759 Last data filed at 09/06/2022 0640 Gross per 24 hour  Intake 3035.33 ml  Output 3160 ml  Net -124.67 ml   Filed Weights   09/02/22 0526 09/03/22 1900 09/04/22 0600  Weight: 65.4 kg 65.4 kg 66.5 kg    Examination:  General exam: Appears calm and comfortable  Respiratory system: Tachypnea, BL crackles.  Cardiovascular system: S1 & S2 heard, RRR Gastrointestinal system: Abdomen is nondistended, soft and nontender.  Central nervous system: Alert and oriented.  Extremities: Symmetric 5 x 5 power.    Data Reviewed: I have personally reviewed following labs and imaging studies  CBC: Recent Labs  Lab 09/02/22 0524 09/03/22 0351 09/04/22 0614 09/05/22 0313 09/06/22 0650  WBC 9.9 11.0* 12.2* 9.4 7.8  NEUTROABS  --  9.4* 10.8*  --   --   HGB 11.0* 11.1* 10.1* 9.3* 9.2*  HCT 33.5* 35.3* 31.2* 29.5* 29.3*  MCV 77.2* 77.6* 77.2* 79.9* 80.3  PLT 465* 456* 408* 383 [id number]   Basic Metabolic Panel: Recent Labs  Lab 09/01/22 0545 09/02/22 0524 09/03/22 0351 09/04/22 0614 09/05/22 0313 09/06/22 0650  NA 136 131* 132* 126* 127* 132*  K 3.6 3.3* 3.6 3.5 3.1* 4.2  CL 94* 94* 94* 93* 98 103  CO2 30 29 26 25 23  21*  GLUCOSE 145*  141* 177* 131* 135* 131*  BUN 10 14 13 14 18 13   CREATININE 0.57 0.67 0.79 0.75 0.81 0.79  CALCIUM 9.2 8.9 8.8* 7.5* 6.9* 7.2*  MG 2.0  --  2.0 2.0 2.3 2.2  PHOS  --   --   --  3.2 2.2* 2.6   GFR: Estimated Creatinine Clearance: 60.5 mL/min (by C-G formula based on SCr of 0.79 mg/dL). Liver Function Tests: Recent Labs  Lab 09/02/22 0524 09/04/22 0614 09/05/22 0313 09/06/22 0650  AST 13* 19 18 15   ALT 13 17 14 14   ALKPHOS 95 103 97 88  BILITOT 1.2 0.8 0.7 0.5  PROT 6.9 6.3* 5.9* 6.2*   ALBUMIN 3.0* 2.7* 2.5* 2.4*   No results for input(s): "LIPASE", "AMYLASE" in the last 168 hours. No results for input(s): "AMMONIA" in the last 168 hours. Coagulation Profile: No results for input(s): "INR", "PROTIME" in the last 168 hours. Cardiac Enzymes: No results for input(s): "CKTOTAL", "CKMB", "CKMBINDEX", "TROPONINI" in the last 168 hours. BNP (last 3 results) No results for input(s): "PROBNP" in the last 8760 hours. HbA1C: No results for input(s): "HGBA1C" in the last 72 hours. CBG: Recent Labs  Lab 09/05/22 0758 09/05/22 1219 09/05/22 1613 09/05/22 2113 09/06/22 0718  GLUCAP 142* 167* 117* 173* 139*   Lipid Profile: No results for input(s): "CHOL", "HDL", "LDLCALC", "TRIG", "CHOLHDL", "LDLDIRECT" in the last 72 hours. Thyroid Function Tests: No results for input(s): "TSH", "T4TOTAL", "FREET4", "T3FREE", "THYROIDAB" in the last 72 hours. Anemia Panel: No results for input(s): "VITAMINB12", "FOLATE", "FERRITIN", "TIBC", "IRON", "RETICCTPCT" in the last 72 hours. Sepsis Labs: Recent Labs  Lab 09/03/22 0351  LATICACIDVEN 1.3    Recent Results (from the past 240 hour(s))  Resp panel by RT-PCR (RSV, Flu A&B, Covid) Anterior Nasal Swab     Status: None   Collection Time: 08/29/22  2:07 PM   Specimen: Anterior Nasal Swab  Result Value Ref Range Status   SARS Coronavirus 2 by RT PCR NEGATIVE NEGATIVE Final   Influenza A by PCR NEGATIVE NEGATIVE Final   Influenza B by PCR NEGATIVE NEGATIVE Final    Comment: (NOTE) The Xpert Xpress SARS-CoV-2/FLU/RSV plus assay is intended as an aid in the diagnosis of influenza from Nasopharyngeal swab specimens and should not be used as a sole basis for treatment. Nasal washings and aspirates are unacceptable for Xpert Xpress SARS-CoV-2/FLU/RSV testing.  Fact Sheet for Patients: EntrepreneurPulse.com.au  Fact Sheet for Healthcare Providers: IncredibleEmployment.be  This test is not yet  approved or cleared by the [place] FDA and has been authorized for detection and/or diagnosis of SARS-CoV-2 by FDA under an Emergency Use Authorization (EUA). This EUA will remain in effect (meaning this test can be used) for the duration of the COVID-19 declaration under Section 564(b)(1) of the Act, 21 U.S.C. section 360bbb-3(b)(1), unless the authorization is terminated or revoked.     Resp Syncytial Virus by PCR NEGATIVE NEGATIVE Final    Comment: (NOTE) Fact Sheet for Patients: EntrepreneurPulse.com.au  Fact Sheet for Healthcare Providers: IncredibleEmployment.be  This test is not yet approved or cleared by the [place] FDA and has been authorized for detection and/or diagnosis of SARS-CoV-2 by FDA under an Emergency Use Authorization (EUA). This EUA will remain in effect (meaning this test can be used) for the duration of the COVID-19 declaration under Section 564(b)(1) of the Act, 21 U.S.C. section 360bbb-3(b)(1), unless the authorization is terminated or revoked.  Performed at [facility], [place] [address], [place], [place]  [address]   Expectorated Sputum Assessment w Gram Stain, Rflx to Resp Cult     Status: None   Collection Time: 08/29/22  9:22 PM   Specimen: Expectorated Sputum  Result Value Ref Range Status   Specimen Description EXPECTORATED SPUTUM  Final   Special Requests NONE  Final   Sputum evaluation   Final    THIS SPECIMEN IS ACCEPTABLE FOR SPUTUM CULTURE Performed at [facility], [place] [address] [place], [place] [address]    Report Status 09/05/2022 FINAL  Final  Culture, Respiratory w Gram Stain     Status: None   Collection Time: 08/29/22  9:22 PM  Result Value Ref Range Status   Specimen Description EXPECTORATED SPUTUM  Final   Special Requests NONE Reflexed from [id number]  Final   Gram Stain   Final    RARE WBC PRESENT, PREDOMINANTLY MONONUCLEAR NO ORGANISMS SEEN    Culture   Final     RARE Normal respiratory flora-no Staph aureus or Pseudomonas seen Performed at [facility], [place] [address], [place], [place] [address]    Report Status 09/03/2022 FINAL  Final  Body fluid culture w Gram Stain     Status: None (Preliminary result)   Collection Time: 09/03/22  3:03 PM   Specimen: Pleural Fluid  Result Value Ref Range Status   Specimen Description   Final    PLEURAL Performed at [facility] [address] [place], [place] [address]    Special Requests   Final    NONE Performed at [facility], [place] [address], [place], [place] [address]    Gram Stain   Final    RARE WBC PRESENT, PREDOMINANTLY PMN NO ORGANISMS SEEN    Culture   Final    NO GROWTH 2 DAYS Performed at [facility] [address] [place], [place] [address]    Report Status PENDING  Incomplete  MRSA Next Gen by PCR, Nasal     Status: None   Collection Time: 09/03/22  7:39 PM   Specimen: Nasal Mucosa; Nasal Swab  Result Value Ref Range Status   MRSA by PCR Next Gen NOT DETECTED NOT DETECTED Final    Comment: (NOTE) The GeneXpert MRSA Assay (FDA approved for NASAL specimens only), is one component of a comprehensive MRSA colonization surveillance program. It is not intended to diagnose MRSA infection nor to guide or monitor treatment for MRSA infections. Test performance is not FDA approved in patients less than 59 years old. Performed at [facility], [place] [address] [place],  [address]          Radiology Studies: [facility] Chest Port 1 View  Result Date: 09/06/2022 CLINICAL DATA:  [id number] with pleural effusion and left pigtail chest tube. EXAM: PORTABLE CHEST 1 VIEW COMPARISON:  Lat chest yesterday at 12:09 p.m. FINDINGS: 4:47 a.m. Pigtail left chest tube positioning is stable in the lower lateral left thorax. There is no visible pneumothorax. There is mild cardiomegaly. The aorta is tortuous with calcification in the  transverse segment. Stable mediastinum. Small increased left pleural effusion noted today with increased left lower lobe opacity consistent with atelectasis, pneumonia or aspiration. Left perihilar infiltrate or mass appears similar. The lungs show increased interstitial markings which could be due to edema or pneumonitis. Stable small right pleural effusion. Right-sided aeration is unchanged with no focal consolidation. There is osteopenia and thoracic spondylosis. IMPRESSION: 1. Increased left pleural effusion and left lower lobe opacity  consistent with atelectasis, pneumonia or aspiration. 2. Left perihilar infiltrate or mass appears similar. 3. Increased interstitial markings which could be due to edema or pneumonitis. 4. Stable small right pleural effusion.  Cardiomegaly. 5. Aortic atherosclerosis and uncoiling. Electronically Signed   By: Telford Nab M.D.   On: 09/06/2022 07:12   DG CHEST PORT 1 VIEW  Result Date: 09/05/2022 CLINICAL DATA:  Status post tube placement. EXAM: PORTABLE CHEST 1 VIEW COMPARISON:  Same day. FINDINGS: Stable cardiomediastinal silhouette. Right internal jugular Port-A-Cath is unchanged. Interval placement of left-sided chest tube. Left pleural effusion appears to be nearly resolved. Left perihilar infiltrate or mass is noted. IMPRESSION: Interval placement of left-sided chest tube. Left pleural effusion is nearly resolved. No definite pneumothorax is noted. Left perihilar infiltrate or mass is noted. Electronically Signed   By: Marijo Conception M.D.   On: 09/05/2022 12:16   DG Chest 1 View  Result Date: 09/05/2022 CLINICAL DATA:  Pleural effusion EXAM: CHEST  1 VIEW COMPARISON:  09/03/2022. FINDINGS: Increasing opacity left hemithorax. Right perihilar masslike opacity. Pulmonary vascular congestion. No pneumothorax identified. Right-sided Port-A-Cath tip mid SVC. Calcified aorta. IMPRESSION: Increasing left hemithorax opacification. Right perihilar masslike density.  Electronically Signed   By: Sammie Bench M.D.   On: 09/05/2022 06:52   IR IMAGING GUIDED PORT INSERTION  Result Date: 09/04/2022 INDICATION: 72 year old with metastatic lung cancer. Port-A-Cath needed to assist with patient's treatment. EXAM: FLUOROSCOPIC AND ULTRASOUND GUIDED PLACEMENT OF A SUBCUTANEOUS PORT COMPARISON:  None Available. MEDICATIONS: Moderate sedation ANESTHESIA/SEDATION: Moderate (conscious) sedation was employed during this procedure. A total of Versed 0.5 mg and fentanyl 25 mcg was administered intravenously at the order of the provider performing the procedure. Total intra-service moderate sedation time: 24 minutes. Patient's level of consciousness and vital signs were monitored continuously by radiology nurse throughout the procedure under the supervision of the provider performing the procedure. FLUOROSCOPY TIME:  Radiation Exposure Index (as provided by the fluoroscopic device): 1 mGy Kerma COMPLICATIONS: None immediate. PROCEDURE: The procedure, risks, benefits, and alternatives were explained to the patient/family. Questions regarding the procedure were encouraged and answered. Informed consent was obtained for the procedure. Patient was placed supine on the interventional table. Ultrasound confirmed a patent right internal jugular vein. Ultrasound image was saved for documentation. The right chest and neck were cleaned with a skin antiseptic and a sterile drape was placed. Maximal barrier sterile technique was utilized including caps, mask, sterile gowns, sterile gloves, sterile drape, hand hygiene and skin antiseptic. The right neck was anesthetized with 1% lidocaine. Small incision was made in the right neck with a blade. Micropuncture set was placed in the right internal jugular vein with ultrasound guidance. The micropuncture wire was used for measurement purposes. The right chest was anesthetized with 1% lidocaine with epinephrine. #15 blade was used to make an incision and a  subcutaneous port pocket was formed. [place] was assembled. Subcutaneous tunnel was formed with a stiff tunneling device. The port catheter was brought through the subcutaneous tunnel. The port was placed in the subcutaneous pocket. The micropuncture set was exchanged for a peel-away sheath. The catheter was placed through the peel-away sheath and the tip was positioned at the superior cavoatrial junction. Catheter placement was confirmed with fluoroscopy. The port was accessed and flushed with saline. The port pocket was closed using two layers of absorbable sutures and Dermabond. The vein skin site was closed using a single layer of absorbable suture and Dermabond. Sterile dressings were applied. Patient  tolerated the procedure well without an immediate complication. Ultrasound and fluoroscopic images were taken and saved for this procedure. IMPRESSION: Placement of a subcutaneous power-injectable port device. Catheter tip at the superior cavoatrial junction. Electronically Signed   By: Markus Daft M.D.   On: 09/04/2022 17:45        Scheduled Meds:  atorvastatin  40 mg Oral Daily   CARBOplatin  160 mg Intravenous Once   Chlorhexidine Gluconate Cloth  6 each Topical Daily   diltiazem  60 mg Oral Q8H   dronabinol  2.5 mg Oral BID AC   DULoxetine  60 mg Oral BID   fluticasone  2 spray Each Nare Daily   furosemide  20 mg Oral Daily   insulin aspart  0-6 Units Subcutaneous TID WC   irbesartan  150 mg Oral BID   labetalol  100 mg Oral BID   magnesium oxide  400 mg Oral BID   mometasone-formoterol  2 puff Inhalation BID   PACLitaxel  50 mg/m2 (Treatment Plan Recorded) Intravenous Once   palonosetron  0.25 mg Intravenous Once   pantoprazole  40 mg Oral Daily   phosphorus  500 mg Oral BID   sodium chloride flush  10 mL Intrapleural Q8H   Continuous Infusions:  sodium chloride 100 mL/hr at 09/06/22 0400   ampicillin-sulbactam (UNASYN) IV Stopped (09/06/22 0708)   dexamethasone  (DECADRON) IVPB ([facility]) 10 mg (09/06/22 0753)   diltiazem (CARDIZEM) infusion Stopped (09/05/22 0600)   famotidine (PEPCID) IV (ONCOLOGY)     heparin 1,000 Units/hr (09/06/22 0400)     LOS: 8 days    Time spent: 35 minutes    Ashur Glatfelter A Angeli Demilio, MD [facility]   If 7PM-7AM, please contact night-coverage [email or web address]  09/06/2022, 7:59 AM

## 2022-09-06 NOTE — Progress Notes (Signed)
eLink Physician-Brief Progress Note Patient Name: Rhonda Blackwell DOB: 1951/02/16 MRN: [id number]   Date of Service  09/06/2022  HPI/Events of Note  72 year old with COPD, diabetes, CVA and A-fib on Eliquis that presented to the hospital with metastatic disease complicated by acute on chronic respiratory failure with left-sided pleural effusion and lung mass.  She underwent left-sided pleural cath placement yesterday and subsequently underwent talc pleurodesis today.  Question about whether to maintain intrapleural flushes post procedure.  eICU Interventions  For now, can discontinue intrapleural flushes.  Maintain tube on suction.     Intervention Category Minor Interventions: Routine modifications to care plan (e.g. PRN medications for pain, fever)  Nihal Doan 09/06/2022, 11:07 PM

## 2022-09-06 NOTE — Progress Notes (Signed)
Pharmacy Brief Note - Evening Anticoagulation Follow Up:  Pt is a 36 yoF on heparin with a targeted lower goal while apixaban on hold. For full history, see note by Suzzanne Cloud, PharmD from earlier today.   Assessment: Heparin level = 0.28 is now slightly subtherapeutic on heparin infusion of 1000 units/hr Confirmed with RN that heparin infusing at correct rate. No signs of bleeding. No documented pauses/interruptions on the [facility].  Goal: Heparin level 0.3 - 0.5  Plan: Increase heparin slightly to 1050 units/hr Check heparin level 8 hours after rate change CBC with AM labs  Monitor for signs of bleeding  Lenis Noon, PharmD 09/06/22 4:15 PM

## 2022-09-06 NOTE — Progress Notes (Signed)
ANTICOAGULATION CONSULT NOTE - Follow Up Consult  Pharmacy Consult for Heparin Indication: atrial fibrillation (apixaban on hold)  Allergies  Allergen Reactions   Bystolic [Nebivolol Hcl] Nausea Only and Other (See Comments)    Abdominal pain   Hydrochlorothiazide Nausea Only and Swelling    Leg swelling   Norvasc [Amlodipine] Swelling    Leg swelling    Patient Measurements: Height: 5\' 4"  (162.6 cm) Weight: 66.5 kg (146 lb 9.7 oz) IBW/kg (Calculated) : 54.7 Heparin Dosing Weight: TBW  Vital Signs: Temp: 98.4 F (36.9 C) (03/19 2337) Temp Source: Axillary (03/19 2337) BP: 99/56 (03/19 2300) Pulse Rate: 68 (03/19 2300)  Labs: Recent Labs    09/03/22 0351 09/03/22 1804 09/03/22 1929 09/04/22 0614 09/05/22 0313 09/05/22 0501 09/05/22 1243 09/05/22 2325  HGB 11.1*  --   --  10.1* 9.3*  --   --   --   HCT 35.3*  --   --  31.2* 29.5*  --   --   --   PLT 456*  --   --  408* 383  --   --   --   APTT  --    < > 105* 59* 66*  --   --   --   HEPARINUNFRC >1.10*  --   --  0.89* 0.40 0.45 0.29* 0.29*  CREATININE 0.79  --   --  0.75 0.81  --   --   --    < > = values in this interval not displayed.     Estimated Creatinine Clearance: 59.7 mL/min (by C-G formula based on SCr of 0.81 mg/dL).   Medications:  Infusions:   sodium chloride 100 mL/hr at 09/05/22 2000   ampicillin-sulbactam (UNASYN) IV Stopped (09/05/22 2352)   dexamethasone (DECADRON) IVPB ([facility])     diltiazem (CARDIZEM) infusion Stopped (09/05/22 0600)   famotidine (PEPCID) IV (ONCOLOGY)     heparin 900 Units/hr (09/05/22 2000)    Assessment: 50 yoF admitted on 3/12 with history of afib on Eliquis.  She had run out of Eliquis  at home for about a week, but was continued inpatient through 3/15 at 21:00, then held in anticipation of procedures. 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  GI was planning flexible sigmoidoscopy 3/17 to investigate abnormal PET scan findings, but  was delayed d/t clinical instability.  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  Pharmacy consulted for heparin management by Neurology for atrial fibrillation - neuro scale.   Significant Events: 3/17: heparin drip stopped at 0400 for planned flex sig; procedure cancelled due to change in cardiac and respiratory status. Heparin drip restarted at 0930 3/18 Heparin held briefly while in IR for Port placement   Today, 09/06/2022:  Heparin level (3/19 @2325 ) 0.29, still sub-therapeutic on heparin 900 units/hr CBC: Hgb decreased to 9.3, Plts wnl No complications of therapy reported by RN  Goal of Therapy:  Heparin Level 0.3-0.5 Monitor platelets by anticoagulation protocol: Yes   Plan:  - Increase to heparin IV infusion 1000 units/hr - Repeat heparin level in 8 hr to confirm therapeutic dose - Daily heparin level, CBC while on heparin - Follow up plans for potential reschedule of GI procedure   Netta Cedars, PharmD, BCPS 09/06/2022 12:26 AM

## 2022-09-06 NOTE — Progress Notes (Signed)
UNASSIGNED PATIENT Subjective: Patient is 72 year old Asian female with history of COPD AODM CVA hypertension atrial fibrillation Eliquis and metastatic disease from presumed lung cancer awaiting a flexible sigmoidoscopy for rectal thickening noted on PET scan.  Respiratory compromise the procedure has not been done.  Today when I examined the patient she just recently returned from radiation therapy. She denies having any abdominal pain nausea vomiting.  Her last bowel movement was a couple of days ago.  Objective: Vital signs in last 24 hours: Temp:  [97.8 F (36.6 C)-98.4 F (36.9 C)] 98.3 F (36.8 C) (03/20 1200) Pulse Rate:  [56-79] 68 (03/20 1500) Resp:  [16-27] 17 (03/20 1500) BP: (94-177)/(47-68) 111/55 (03/20 1500) SpO2:  [90 %-100 %] 99 % (03/20 1500) Last BM Date : 09/04/22  Intake/Output from previous day: 03/19 0701 - 03/20 0700 In: 3035.3 [I.V.:2389.8; IV Piggyback:615.5] Out: [id number] [Urine:2500; Chest Tube:660] Intake/Output this shift: Total I/O In: 1389.6 [I.V.:652.5; IV Piggyback:722.1] Out: 2150 [Urine:2050; Chest Tube:100]  General appearance: alert, cooperative, appears stated age, and mild distress Resp: bilateral crackles Cardio: regular rate and rhythm, S1, S2 normal, no murmur, click, rub or gallop GI: soft, non-tender; bowel sounds normal; no masses,  no organomegaly Extremities: extremities normal, atraumatic, no cyanosis or edema  Lab Results: Recent Labs    09/04/22 0614 09/05/22 0313 09/06/22 0650  WBC 12.2* 9.4 7.8  HGB 10.1* 9.3* 9.2*  HCT 31.2* 29.5* 29.3*  PLT 408* 383 380   BMET Recent Labs    09/04/22 0614 09/05/22 0313 09/05/22 0839 09/06/22 0650  NA 126* 127*  --  132*  K 3.5 3.1*  --  4.2  CL 93* 98  --  103  CO2 25 23  --  21*  GLUCOSE 131* 135*  --  131*  BUN 14 18  --  13  CREATININE 0.75 0.81  --  0.79  CALCIUM 7.5* 6.9* 7.0* 7.2*   LFT Recent Labs    09/06/22 0650  PROT 6.2*  ALBUMIN 2.4*  AST 15  ALT 14   ALKPHOS 88  BILITOT 0.5   Studies/Results: DG Chest Port 1 View  Result Date: 09/06/2022 CLINICAL DATA:  [id number] with pleural effusion and left pigtail chest tube. EXAM: PORTABLE CHEST 1 VIEW COMPARISON:  Lat chest yesterday at 12:09 p.m. FINDINGS: 4:47 a.m. Pigtail left chest tube positioning is stable in the lower lateral left thorax. There is no visible pneumothorax. There is mild cardiomegaly. The aorta is tortuous with calcification in the transverse segment. Stable mediastinum. Small increased left pleural effusion noted today with increased left lower lobe opacity consistent with atelectasis, pneumonia or aspiration. Left perihilar infiltrate or mass appears similar. The lungs show increased interstitial markings which could be due to edema or pneumonitis. Stable small right pleural effusion. Right-sided aeration is unchanged with no focal consolidation. There is osteopenia and thoracic spondylosis. IMPRESSION: 1. Increased left pleural effusion and left lower lobe opacity consistent with atelectasis, pneumonia or aspiration. 2. Left perihilar infiltrate or mass appears similar. 3. Increased interstitial markings which could be due to edema or pneumonitis. 4. Stable small right pleural effusion.  Cardiomegaly. 5. Aortic atherosclerosis and uncoiling. Electronically Signed   By: Telford Nab M.D.   On: 09/06/2022 07:12   DG CHEST PORT 1 VIEW  Result Date: 09/05/2022 CLINICAL DATA:  Status post tube placement. EXAM: PORTABLE CHEST 1 VIEW COMPARISON:  Same day. FINDINGS: Stable cardiomediastinal silhouette. Right internal jugular Port-A-Cath is unchanged. Interval placement of left-sided chest tube. Left pleural effusion appears  to be nearly resolved. Left perihilar infiltrate or mass is noted. IMPRESSION: Interval placement of left-sided chest tube. Left pleural effusion is nearly resolved. No definite pneumothorax is noted. Left perihilar infiltrate or mass is noted. Electronically Signed   By:  Marijo Conception M.D.   On: 09/05/2022 12:16   DG Chest 1 View  Result Date: 09/05/2022 CLINICAL DATA:  Pleural effusion EXAM: CHEST  1 VIEW COMPARISON:  09/03/2022. FINDINGS: Increasing opacity left hemithorax. Right perihilar masslike opacity. Pulmonary vascular congestion. No pneumothorax identified. Right-sided Port-A-Cath tip mid SVC. Calcified aorta. IMPRESSION: Increasing left hemithorax opacification. Right perihilar masslike density. Electronically Signed   By: Sammie Bench M.D.   On: 09/05/2022 06:52   IR IMAGING GUIDED PORT INSERTION  Result Date: 09/04/2022 INDICATION: 72 year old with metastatic lung cancer. Port-A-Cath needed to assist with patient's treatment. EXAM: FLUOROSCOPIC AND ULTRASOUND GUIDED PLACEMENT OF A SUBCUTANEOUS PORT COMPARISON:  None Available. MEDICATIONS: Moderate sedation ANESTHESIA/SEDATION: Moderate (conscious) sedation was employed during this procedure. A total of Versed 0.5 mg and fentanyl 25 mcg was administered intravenously at the order of the provider performing the procedure. Total intra-service moderate sedation time: 24 minutes. Patient's level of consciousness and vital signs were monitored continuously by radiology nurse throughout the procedure under the supervision of the provider performing the procedure. FLUOROSCOPY TIME:  Radiation Exposure Index (as provided by the fluoroscopic device): 1 mGy Kerma COMPLICATIONS: None immediate. PROCEDURE: The procedure, risks, benefits, and alternatives were explained to the patient/family. Questions regarding the procedure were encouraged and answered. Informed consent was obtained for the procedure. Patient was placed supine on the interventional table. Ultrasound confirmed a patent right internal jugular vein. Ultrasound image was saved for documentation. The right chest and neck were cleaned with a skin antiseptic and a sterile drape was placed. Maximal barrier sterile technique was utilized including caps, mask,  sterile gowns, sterile gloves, sterile drape, hand hygiene and skin antiseptic. The right neck was anesthetized with 1% lidocaine. Small incision was made in the right neck with a blade. Micropuncture set was placed in the right internal jugular vein with ultrasound guidance. The micropuncture wire was used for measurement purposes. The right chest was anesthetized with 1% lidocaine with epinephrine. #15 blade was used to make an incision and a subcutaneous port pocket was formed. [place] was assembled. Subcutaneous tunnel was formed with a stiff tunneling device. The port catheter was brought through the subcutaneous tunnel. The port was placed in the subcutaneous pocket. The micropuncture set was exchanged for a peel-away sheath. The catheter was placed through the peel-away sheath and the tip was positioned at the superior cavoatrial junction. Catheter placement was confirmed with fluoroscopy. The port was accessed and flushed with saline. The port pocket was closed using two layers of absorbable sutures and Dermabond. The vein skin site was closed using a single layer of absorbable suture and Dermabond. Sterile dressings were applied. Patient tolerated the procedure well without an immediate complication. Ultrasound and fluoroscopic images were taken and saved for this procedure. IMPRESSION: Placement of a subcutaneous power-injectable port device. Catheter tip at the superior cavoatrial junction. Electronically Signed   By: Markus Daft M.D.   On: 09/04/2022 17:45    Medications: I have reviewed the patient's current medications. Prior to Admission:  Medications Prior to Admission  Medication Sig Dispense Refill Last Dose   acetaminophen (TYLENOL) 650 MG CR tablet Take 650 mg by mouth every 8 (eight) hours as needed for pain.   Past Week  albuterol (PROVENTIL HFA;VENTOLIN HFA) 108 (90 BASE) MCG/ACT inhaler Inhale 2 puffs into the lungs every 6 (six) hours as needed for shortness of breath  (cough). 1 Inhaler 0 UNK   apixaban (ELIQUIS) 5 MG TABS tablet Take 1 tablet (5 mg total) by mouth 2 (two) times daily. 60 tablet 0 Past Week   atorvastatin (LIPITOR) 40 MG tablet Take 40 mg by mouth daily.   08/29/2022   calcium carbonate (OSCAL) 1500 (600 Ca) MG TABS tablet Take 600 mg of elemental calcium by mouth daily.   08/29/2022   diltiazem (CARDIZEM CD) 120 MG 24 hr capsule Take 1 capsule (120 mg total) by mouth daily. 30 capsule 0 Past Week   DULoxetine (CYMBALTA) 60 MG capsule Take 60 mg by mouth 2 (two) times daily.   08/29/2022   fluticasone (FLONASE) 50 MCG/ACT nasal spray Place 2 sprays into both nostrils daily.   08/29/2022   fluticasone-salmeterol (ADVAIR) 250-50 MCG/ACT AEPB Inhale 1 puff into the lungs 2 (two) times daily as needed (wheezing, shortness of breath.).   08/29/2022   furosemide (LASIX) 20 MG tablet Take 20 mg by mouth daily.   08/29/2022   irbesartan (AVAPRO) 150 MG tablet Take 150 mg by mouth 2 (two) times daily.   08/29/2022   labetalol (NORMODYNE) 100 MG tablet Take 100 mg by mouth 2 (two) times daily.   08/29/2022 at 0900   pantoprazole (PROTONIX) 40 MG tablet Take 40 mg by mouth daily.   08/29/2022   sitaGLIPtan-metformin (JANUMET) 50-500 MG per tablet Take 1 tablet by mouth 2 (two) times daily with a meal.   08/29/2022   azithromycin (ZITHROMAX) 250 MG tablet Take 1 tablet (250 mg total) by mouth daily for 2 more days (Patient not taking: Reported on 08/30/2022) 2 each 0 Completed Course   Scheduled:  atorvastatin  40 mg Oral Daily   Chlorhexidine Gluconate Cloth  6 each Topical Daily   diltiazem  60 mg Oral Q8H   dronabinol  2.5 mg Oral BID AC   DULoxetine  60 mg Oral BID   fluticasone  2 spray Each Nare Daily   furosemide  40 mg Intravenous Daily   insulin aspart  0-6 Units Subcutaneous TID WC   irbesartan  150 mg Oral BID   labetalol  100 mg Oral BID   magnesium oxide  400 mg Oral BID   mometasone-formoterol  2 puff Inhalation BID   pantoprazole  40 mg Oral  Daily   phosphorus  500 mg Oral BID   sodium chloride flush  10 mL Intrapleural Q8H   Continuous:  ampicillin-sulbactam (UNASYN) IV Stopped (09/06/22 1310)   dexmedetomidine (PRECEDEX) IV infusion 0.2 mcg/kg/hr (09/06/22 1537)   heparin 1,000 Units/hr (09/06/22 1701)   [id number], albuterol, guaiFENesin-dextromethorphan, morphine injection, mouth rinse  Assessment/Plan: 1) Normal PET scan with elevated CEA awaiting flexible sigmoidoscopy that has been postponed due to respiratory compromise.  Dr. Benson Norway to evaluate tomorrow and may be posted for flex sig on Friday, 09/08/2022. 2) Metastatic cancer on with unknown primary. 3) Acute on chronic respiratory failure secondary to left-sided pleural effusion lung mass. 4) Paroxysmal atrial fibrillation with RVR. 5) Hypomagnesemia hypophosphatemia and hypocalcemia-replaced. 6) HTN. 7) AODM. 8) Chronic anxiety.  LOS: 8 days   Juanita Craver 09/06/2022, 5:00 PM

## 2022-09-06 NOTE — Progress Notes (Incomplete)
Subjective: Since I last evaluated the patient ***  Objective: Vital signs in last 24 hours: Temp:  [97.8 F (36.6 C)-98.6 F (37 C)] 98.3 F (36.8 C) (03/20 1200) Pulse Rate:  [56-79] 67 (03/20 1400) Resp:  [16-31] 19 (03/20 1400) BP: (94-177)/(47-70) 117/55 (03/20 1400) SpO2:  [90 %-100 %] 99 % (03/20 1400) Last BM Date : 09/04/22  Intake/Output from previous day: 03/19 0701 - 03/20 0700 In: 3035.3 [I.V.:2389.8; IV Piggyback:615.5] Out: [id number] [Urine:2500; Chest Tube:660] Intake/Output this shift: Total I/O In: 1369 [I.V.:631.9; IV Piggyback:722.1] Out: [id number] [Urine:1550; Chest Tube:100]  [id number]  Lab Results: Recent Labs    09/04/22 0614 09/05/22 0313 09/06/22 0650  WBC 12.2* 9.4 7.8  HGB 10.1* 9.3* 9.2*  HCT 31.2* 29.5* 29.3*  PLT 408* 383 380   BMET Recent Labs    09/04/22 0614 09/05/22 0313 09/05/22 0839 09/06/22 0650  NA 126* 127*  --  132*  K 3.5 3.1*  --  4.2  CL 93* 98  --  103  CO2 25 23  --  21*  GLUCOSE 131* 135*  --  131*  BUN 14 18  --  13  CREATININE 0.75 0.81  --  0.79  CALCIUM 7.5* 6.9* 7.0* 7.2*   LFT Recent Labs    09/06/22 0650  PROT 6.2*  ALBUMIN 2.4*  AST 15  ALT 14  ALKPHOS 88  BILITOT 0.5   PT/INR No results for input(s): "LABPROT", "INR" in the last 72 hours. Hepatitis Panel No results for input(s): "HEPBSAG", "HCVAB", "HEPAIGM", "HEPBIGM" in the last 72 hours. C-Diff No results for input(s): "CDIFFTOX" in the last 72 hours. No results for input(s): "CDIFFPCR" in the last 72 hours. Fecal Lactopherrin No results for input(s): "FECLLACTOFRN" in the last 72 hours.  Studies/Results: DG Chest Port 1 View  Result Date: 09/06/2022 CLINICAL DATA:  [id number] with pleural effusion and left pigtail chest tube. EXAM: PORTABLE CHEST 1 VIEW COMPARISON:  Lat chest yesterday at 12:09 p.m. FINDINGS: 4:47 a.m. Pigtail left chest tube positioning is stable in the lower lateral left thorax. There is no visible pneumothorax. There is  mild cardiomegaly. The aorta is tortuous with calcification in the transverse segment. Stable mediastinum. Small increased left pleural effusion noted today with increased left lower lobe opacity consistent with atelectasis, pneumonia or aspiration. Left perihilar infiltrate or mass appears similar. The lungs show increased interstitial markings which could be due to edema or pneumonitis. Stable small right pleural effusion. Right-sided aeration is unchanged with no focal consolidation. There is osteopenia and thoracic spondylosis. IMPRESSION: 1. Increased left pleural effusion and left lower lobe opacity consistent with atelectasis, pneumonia or aspiration. 2. Left perihilar infiltrate or mass appears similar. 3. Increased interstitial markings which could be due to edema or pneumonitis. 4. Stable small right pleural effusion.  Cardiomegaly. 5. Aortic atherosclerosis and uncoiling. Electronically Signed   By: Telford Nab M.D.   On: 09/06/2022 07:12   DG CHEST PORT 1 VIEW  Result Date: 09/05/2022 CLINICAL DATA:  Status post tube placement. EXAM: PORTABLE CHEST 1 VIEW COMPARISON:  Same day. FINDINGS: Stable cardiomediastinal silhouette. Right internal jugular Port-A-Cath is unchanged. Interval placement of left-sided chest tube. Left pleural effusion appears to be nearly resolved. Left perihilar infiltrate or mass is noted. IMPRESSION: Interval placement of left-sided chest tube. Left pleural effusion is nearly resolved. No definite pneumothorax is noted. Left perihilar infiltrate or mass is noted. Electronically Signed   By: Marijo Conception M.D.   On: 09/05/2022 12:16   DG  Chest 1 View  Result Date: 09/05/2022 CLINICAL DATA:  Pleural effusion EXAM: CHEST  1 VIEW COMPARISON:  09/03/2022. FINDINGS: Increasing opacity left hemithorax. Right perihilar masslike opacity. Pulmonary vascular congestion. No pneumothorax identified. Right-sided Port-A-Cath tip mid SVC. Calcified aorta. IMPRESSION: Increasing left  hemithorax opacification. Right perihilar masslike density. Electronically Signed   By: Sammie Bench M.D.   On: 09/05/2022 06:52   IR IMAGING GUIDED PORT INSERTION  Result Date: 09/04/2022 INDICATION: 72 year old with metastatic lung cancer. Port-A-Cath needed to assist with patient's treatment. EXAM: FLUOROSCOPIC AND ULTRASOUND GUIDED PLACEMENT OF A SUBCUTANEOUS PORT COMPARISON:  None Available. MEDICATIONS: Moderate sedation ANESTHESIA/SEDATION: Moderate (conscious) sedation was employed during this procedure. A total of Versed 0.5 mg and fentanyl 25 mcg was administered intravenously at the order of the provider performing the procedure. Total intra-service moderate sedation time: 24 minutes. Patient's level of consciousness and vital signs were monitored continuously by radiology nurse throughout the procedure under the supervision of the provider performing the procedure. FLUOROSCOPY TIME:  Radiation Exposure Index (as provided by the fluoroscopic device): 1 mGy Kerma COMPLICATIONS: None immediate. PROCEDURE: The procedure, risks, benefits, and alternatives were explained to the patient/family. Questions regarding the procedure were encouraged and answered. Informed consent was obtained for the procedure. Patient was placed supine on the interventional table. Ultrasound confirmed a patent right internal jugular vein. Ultrasound image was saved for documentation. The right chest and neck were cleaned with a skin antiseptic and a sterile drape was placed. Maximal barrier sterile technique was utilized including caps, mask, sterile gowns, sterile gloves, sterile drape, hand hygiene and skin antiseptic. The right neck was anesthetized with 1% lidocaine. Small incision was made in the right neck with a blade. Micropuncture set was placed in the right internal jugular vein with ultrasound guidance. The micropuncture wire was used for measurement purposes. The right chest was anesthetized with 1% lidocaine  with epinephrine. #15 blade was used to make an incision and a subcutaneous port pocket was formed. [place] was assembled. Subcutaneous tunnel was formed with a stiff tunneling device. The port catheter was brought through the subcutaneous tunnel. The port was placed in the subcutaneous pocket. The micropuncture set was exchanged for a peel-away sheath. The catheter was placed through the peel-away sheath and the tip was positioned at the superior cavoatrial junction. Catheter placement was confirmed with fluoroscopy. The port was accessed and flushed with saline. The port pocket was closed using two layers of absorbable sutures and Dermabond. The vein skin site was closed using a single layer of absorbable suture and Dermabond. Sterile dressings were applied. Patient tolerated the procedure well without an immediate complication. Ultrasound and fluoroscopic images were taken and saved for this procedure. IMPRESSION: Placement of a subcutaneous power-injectable port device. Catheter tip at the superior cavoatrial junction. Electronically Signed   By: Markus Daft M.D.   On: 09/04/2022 17:45    Medications: {medication reviewed/display:[id number]}  Assessment/Plan: ***  LOS: 8 days   Rhonda Blackwell 09/06/2022, 2:52 PM

## 2022-09-06 NOTE — Progress Notes (Addendum)
NAMESheritha Blackwell, MRN:  [id number], DOB:  10/19/1950, LOS: 8 ADMISSION DATE:  08/29/2022, CONSULTATION DATE: 08/29/2022 REFERRING MD: Dr. Flossie Blackwell, CHIEF COMPLAINT: Shortness of breath, hypoxemic respiratory failure  History of Present Illness:  Patient presented to the hospital with shortness of breath Cough bringing up yellow phlegm, was recently hospitalized early February for pneumonia, sepsis Has had some chest tightness  Known to have A-fib, on anticoagulation  History of COPD, diabetes, hypertension, bronchitis  Recently diagnosed with metastatic cancer from a biopsy of scapula lesion  Known to have a left lung mass, recently had a bronchoscopy in January-patient does not recollect being told about cancer being found from that procedure, notes did not reveal endobronchial lesion on the left side.  Patient was dismissed from oncology practice secondary to multiple missed appointments-has not seen a pulmonologist in a year  She uses oxygen about 2 and half liters at home, same with exertion Uses inhalers  Pertinent  Medical History   Past Medical History:  Diagnosis Date   Bronchitis    COPD (chronic obstructive pulmonary disease) ([place])    Diabetes mellitus without complication ([place])    Hypertension    Lung cancer, primary, with metastasis from lung to other site, left ([place]) 09/04/2022    Significant Hospital Events: Including procedures, antibiotic start and stop dates in addition to other pertinent events   CT reviewed-left upper lobe mass, with probable lymphangitic spread, mediastinal adenopathy PET/CT 05/17/2022-hypermetabolic left upper lobe nodule, hypermetabolic left hilar and mediastinal lymph nodes, hypermetabolic left scapular lesion, hypermetabolic lesion in acetabulum 3/14 transferred to [facility] 3/15 received radiation therapy 3/17 A-fib with RVR, colonoscopy canceled 3/19: Left thoracostomy tube placed, complicated by what seems to be reexpansion pulmonary  edema treated with Lasix and positive pressure 3/20 improved respiratory status, starting chemotherapy  Interim History / Subjective:  No distress resting comfortably   Objective   Blood pressure 120/63, pulse 66, temperature 97.8 F (36.6 C), temperature source Oral, resp. rate 17, height 5\' 4"  (1.626 m), weight 66.5 kg, SpO2 98 %.    FiO2 (%):  [40 %] 40 %   Intake/Output Summary (Last 24 hours) at 09/06/2022 0754 Last data filed at 09/06/2022 0640 Gross per 24 hour  Intake 3035.33 ml  Output 3160 ml  Net -124.67 ml    Filed Weights   09/02/22 0526 09/03/22 1900 09/04/22 0600  Weight: 65.4 kg 65.4 kg 66.5 kg    Examination: General: Resting in bed currently no acute distress HEENT normocephalic atraumatic no jugular venous distention appreciated Pulmonary: Some scattered rhonchi, otherwise diminished.  The left chest tube has serous output, about 500 mL since insertion Portable chest x-ray personally reviewed: 1 thoracostomy tubes in satisfactory position, increased left lower lobe volume loss, favor atelectasis.  Increased bilateral airspace disease Cardiac: Regular rate and rhythm Abdomen: Soft nontender Extremities: Warm dry brisk cap refill Neuro: Awake oriented no focal deficits.   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  She is better today but CXR still w/ edema and looks like increased volume loss which  is more likely post-obstructive atx  than effusion.  Plan Continuing radiation therapy Scheduled for chemotherapy today Irrigate CT q 8hrs and PRN Keeping chest tube to suction for now, we will plan on talc pleurodesis later this afternoon Continue Unasyn, stop date is ordered Continue Dulera Wean oxygen Continuing IV heparin in lieu of DOAC Rate control with calcium channel blocker Change oral Lasix to IV Follow-up urine osmolality A.m. chest x-ray   Rhonda Blackwell ACNP-BC [place] Pager # [phone] OR # [phone] if no answer

## 2022-09-06 NOTE — Procedures (Signed)
Chest Tube Pleurodesis Note  Rhonda Blackwell  [id number]  1951-01-24  Date:09/06/22  Time:2:48 PM   Provider Performing:Hiawatha Dressel C Tamala Julian   Procedure: Chemical Pleurodesis via Chest Tube (32560)  Indication(s) Induced scarring of pleural space  Consent Risks of the procedure as well as the alternatives and risks of each were explained to the patient and/or caregiver.  Consent for the procedure was obtained.   Anesthesia 10cc 1% Lidocaine injected in with pleurodesis agent   Time Out Verified patient identification, verified procedure, site/side was marked, verified correct patient position, special equipment/implants available, medications/allergies/relevant history reviewed, required imaging and test results available.   Sterile Technique Hand hygiene, gloves   Procedure Description Existing pleural catheter was cleaned and accessed in sterile manner.  Talc 4g in 50cc saline slurry administered into pleural space.  The chest tube will be clamped and patient will rotate positions for 1 hour then chest tube will be placed to suction.   Complications/Tolerance None; patient tolerated the procedure well.   EBL None   Specimen(s) None

## 2022-09-06 NOTE — Progress Notes (Signed)
ANTICOAGULATION CONSULT NOTE - Follow Up Consult  Pharmacy Consult for Heparin Indication: atrial fibrillation (apixaban on hold)  Allergies  Allergen Reactions   Bystolic [Nebivolol Hcl] Nausea Only and Other (See Comments)    Abdominal pain   Hydrochlorothiazide Nausea Only and Swelling    Leg swelling   Norvasc [Amlodipine] Swelling    Leg swelling    Patient Measurements: Height: 5\' 4"  (162.6 cm) Weight: 66.5 kg (146 lb 9.7 oz) IBW/kg (Calculated) : 54.7 Heparin Dosing Weight: 65.4 kg  Vital Signs: Temp: 98.4 F (36.9 C) (03/20 0757) Temp Source: Oral (03/20 0757) BP: 125/60 (03/20 1000) Pulse Rate: 77 (03/20 1000)  Labs: Recent Labs    09/03/22 1929 09/04/22 0614 09/04/22 0614 09/05/22 0313 09/05/22 0501 09/05/22 1243 09/05/22 2325 09/06/22 0650 09/06/22 0910  HGB  --  10.1*   < > 9.3*  --   --   --  9.2*  --   HCT  --  31.2*  --  29.5*  --   --   --  29.3*  --   PLT  --  408*  --  383  --   --   --  380  --   APTT 105* 59*  --  66*  --   --   --   --   --   HEPARINUNFRC  --  0.89*   < > 0.40   < > 0.29* 0.29*  --  0.38  CREATININE  --  0.75  --  0.81  --   --   --  0.79  --    < > = values in this interval not displayed.     Estimated Creatinine Clearance: 60.5 mL/min (by C-G formula based on SCr of 0.79 mg/dL).   Medications:  Infusions:   ampicillin-sulbactam (UNASYN) IV Stopped (09/06/22 0708)   diltiazem (CARDIZEM) infusion Stopped (09/05/22 0600)   heparin 1,000 Units/hr (09/06/22 0908)    Assessment: 81 yoF admitted on 3/12 with history of afib on Eliquis.  She had run out of Eliquis  at home for about a week, but was continued inpatient through 3/15 at 21:00, then held in anticipation of procedures. 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  GI was planning flexible sigmoidoscopy 3/17 to investigate abnormal PET scan findings, but was delayed d/t clinical instability.  MRI 3/16 with small acute infarct in the  left frontal white matter.  Pharmacy consulted for heparin management by Neurology for atrial fibrillation - neuro scale.   Significant Events: 3/17: heparin drip stopped at 0400 for planned flex sig; procedure cancelled due to change in cardiac and respiratory status. Heparin drip restarted at 0930 3/18 Heparin held briefly while in IR for Port placement 3/19 Heparin level (3/19 @2325 ) 0.29, still sub-therapeutic on heparin 900 units/hr and increased to 1000 units/hr   Today, 09/06/2022:  09:10 Heparin level within goal at 0.38 CBC: Hgb decreased to 9.2 but steady, Plts wnl No bleeding and no complications of therapy reported by RN  Goal of Therapy:  Heparin Level 0.3-0.5 Monitor platelets by anticoagulation protocol: Yes   Plan:  - Continue heparin IV infusion at 1000 units/hr - Repeat confirmatory heparin level in 8 hr - Daily heparin level, CBC while on heparin - Follow up plans for potential reschedule of endoscopic procedure    Thank you for allowing pharmacy to be a part of this patient's care.  Royetta Asal, PharmD, BCPS Clinical Pharmacist [place] Please utilize Amion for  appropriate phone number to reach the unit pharmacist ([place]) 09/06/2022 10:28 AM

## 2022-09-06 NOTE — Progress Notes (Signed)
PT Cancellation Note  Patient Details Name: Rhonda Blackwell MRN: [id number] DOB: 07-02-1950   Cancelled Treatment:    Reason Eval/Treat Not Completed: Medical issues which prohibited therapy [place] Office [phone] Weekend [phone]   Claretha Cooper 09/06/2022, 9:22 AM

## 2022-09-06 NOTE — Progress Notes (Signed)
OT Cancellation Note  Patient Details Name: Rhonda Blackwell MRN: [id number] DOB: 07/23/1950   Cancelled Treatment:    Reason Eval/Treat Not Completed: Medical issues which prohibited therapy Patient pending chemo in next few minutes per nurse with nurse requesting therapy hold off with patient having chemo in AM, chest tube removal planned, and radiation in PM. OT to continue to follow and check back on 3/21.  Rennie Plowman, MS Acute Rehabilitation Department Office# [phone]  09/06/2022, 9:17 AM

## 2022-09-06 NOTE — Progress Notes (Signed)
Unfortunately, Rhonda Blackwell had some difficulty yesterday.  Rhonda Blackwell now has a chest tube and onto the left side.  Rhonda Blackwell had was found to be flash pulmonary edema.  Rhonda Blackwell did have some fluid sent off for evaluation.  We will have to see what it shows.  Rhonda Blackwell actually looks pretty good this morning.  Rhonda Blackwell has not complained of any pain.  Rhonda Blackwell is not having any problems with atrial fibrillation.  Heart monitor shows normal sinus rhythm with occasional PAC.  Rhonda Blackwell is getting radiotherapy.  I put the orders in for chemotherapy to start.  Hopefully, this will start today.  I am unsure if we have the staff to be able to do chemotherapy in the ICU, however.  Rhonda Blackwell continues on heparin for the atrial fibrillation.  Rhonda Blackwell labs today show a white cell count of 7.8.  Hemoglobin 9.2.  Platelet count 380,000.  I think that Rhonda Blackwell is eating okay.  There is no nausea or vomiting.  Clearly, I have to believe that this pleural effusion has to be related to Rhonda Blackwell malignancy.  I think this is why we really need to get chemotherapy going with Rhonda Blackwell so we try to get the left lung opened up.  Rhonda Blackwell will definitely need full dose chemotherapy once radiotherapy is finished.  Rhonda Blackwell has had no fever.  There has been no obvious bleeding.  I think Rhonda Blackwell did get some iron for Rhonda Blackwell anemia.  Rhonda Blackwell vital signs show temperature 97.8.  Pulse 66.  Blood pressure 120/63.  Rhonda Blackwell lungs sound clear.  Rhonda Blackwell has some wheezing over on the left side.  Right side is clear.  Cardiac exam regular rate and rhythm.  Abdomen is soft.  Bowel sounds are present.  There is no guarding or rebound tenderness.  Extremity shows no clubbing, cyanosis or edema.  Neurological exam is nonfocal.  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  Rhonda Blackwell does not have any actionable mutations.  We really need to treat Rhonda Blackwell with radiotherapy and chemotherapy right now.  We need to get the left lung open.  This may also help with the pleural effusion.  We will see what the cytology shows on  the pleural effusion.  As always, the ICU staff have done an incredible job with Rhonda Blackwell.  There are always on top of everything!!!!   [name], MD  [name] 41:10

## 2022-09-07 ENCOUNTER — Ambulatory Visit
Admission: RE | Admit: 2022-09-07 | Discharge: 2022-09-07 | Discharge status: Home / Self Care | Payer: Medicare Other | Source: Ambulatory Visit | Attending: Radiation Oncology

## 2022-09-07 ENCOUNTER — Inpatient Hospital Stay (HOSPITAL_COMMUNITY): Payer: Medicare Other

## 2022-09-07 ENCOUNTER — Ambulatory Visit
Admission: RE | Admit: 2022-09-07 | Discharge: 2022-09-07 | Disposition: A | Discharge status: Home / Self Care | Payer: Medicare Other | Source: Ambulatory Visit | Attending: Radiation Oncology | Admitting: Radiation Oncology

## 2022-09-07 ENCOUNTER — Other Ambulatory Visit: Payer: Self-pay

## 2022-09-07 DIAGNOSIS — J9621 Acute and chronic respiratory failure with hypoxia: Secondary | ICD-10-CM | POA: Diagnosis not present

## 2022-09-07 LAB — RAD ONC ARIA SESSION SUMMARY
Course Elapsed Days: 6
Plan Fractions Treated to Date: 4
Plan Prescribed Dose Per Fraction: 3 Gy
Plan Total Fractions Prescribed: 8
Plan Total Prescribed Dose: 24 Gy
Reference Point Dosage Given to Date: 12 Gy
Reference Point Session Dosage Given: 3 Gy
Session Number: 5

## 2022-09-07 LAB — CBC
HCT: 29.9 % — ABNORMAL LOW (ref 36.0–46.0)
Hemoglobin: 9.5 g/dL — ABNORMAL LOW (ref 12.0–15.0)
MCH: 25.4 pg — ABNORMAL LOW (ref 26.0–34.0)
MCHC: 31.8 g/dL (ref 30.0–36.0)
MCV: 79.9 fL — ABNORMAL LOW (ref 80.0–100.0)
Platelets: 407 10*3/uL — ABNORMAL HIGH (ref 150–400)
RBC: 3.74 MIL/uL — ABNORMAL LOW (ref 3.87–5.11)
RDW: 14.1 % (ref 11.5–15.5)
WBC: 7.8 10*3/uL (ref 4.0–10.5)
nRBC: 0 % (ref 0.0–0.2)

## 2022-09-07 LAB — BASIC METABOLIC PANEL
Anion gap: 8 (ref 5–15)
BUN: 12 mg/dL (ref 8–23)
CO2: 23 mmol/L (ref 22–32)
Calcium: 7.1 mg/dL — ABNORMAL LOW (ref 8.9–10.3)
Chloride: 104 mmol/L (ref 98–111)
Creatinine, Ser: 0.7 mg/dL (ref 0.44–1.00)
GFR, Estimated: >60 mL/min (ref >60)
Glucose, Bld: 131 mg/dL — ABNORMAL HIGH (ref 70–99)
Potassium: 3.5 mmol/L (ref 3.5–5.1)
Sodium: 135 mmol/L (ref 135–145)

## 2022-09-07 LAB — MAGNESIUM: Magnesium: 2.2 mg/dL (ref 1.7–2.4)

## 2022-09-07 LAB — GLUCOSE, CAPILLARY
Glucose-Capillary: 164 mg/dL — ABNORMAL HIGH (ref 70–99)
Glucose-Capillary: 205 mg/dL — ABNORMAL HIGH (ref 70–99)
Glucose-Capillary: 126 mg/dL — ABNORMAL HIGH (ref 70–99)

## 2022-09-07 LAB — HEPARIN LEVEL (UNFRACTIONATED)
Heparin Unfractionated: 0.37 IU/mL (ref 0.30–0.70)
Heparin Unfractionated: 0.3 IU/mL (ref 0.30–0.70)
Heparin Unfractionated: 0.28 IU/mL — ABNORMAL LOW (ref 0.30–0.70)

## 2022-09-07 MED ORDER — POTASSIUM CHLORIDE CRYS ER 20 MEQ PO TBCR
40.0000 meq | EXTENDED_RELEASE_TABLET | Freq: Two times a day (BID) | ORAL | Status: AC
  Administered 2022-09-07 (×2): 40 meq via ORAL
  Filled 2022-09-07 (×2): qty 2

## 2022-09-07 MED ORDER — SODIUM CHLORIDE 0.9 % IV SOLN: INTRAVENOUS | Status: DC | PRN

## 2022-09-07 MED ORDER — DILTIAZEM HCL 60 MG PO TABS
60.0000 mg | ORAL_TABLET | Freq: Four times a day (QID) | ORAL | Status: DC
  Administered 2022-09-07 – 2022-09-09 (×8): 60 mg via ORAL
  Filled 2022-09-07 (×8): qty 1

## 2022-09-07 MED ORDER — ENSURE ENLIVE PO LIQD
237.0000 mL | Freq: Two times a day (BID) | ORAL | Status: DC
  Administered 2022-09-07 – 2022-09-13 (×7): 237 mL via ORAL

## 2022-09-07 MED ORDER — HEPARIN (PORCINE) 25000 UT/250ML-% IV SOLN: 1150.0000 [IU]/h | INTRAVENOUS | Status: AC

## 2022-09-07 MED ORDER — FLEET ENEMA 7-19 GM/118ML RE ENEM: 2.0000 | ENEMA | Freq: Once | RECTAL | Status: DC

## 2022-09-07 MED ORDER — GUAIFENESIN ER 600 MG PO TB12
600.0000 mg | ORAL_TABLET | Freq: Two times a day (BID) | ORAL | Status: DC
  Administered 2022-09-07 – 2022-09-14 (×16): 600 mg via ORAL
  Filled 2022-09-07 (×16): qty 1

## 2022-09-07 MED ORDER — FLEET ENEMA 7-19 GM/118ML RE ENEM
2.0000 | ENEMA | Freq: Once | RECTAL | Status: AC
  Administered 2022-09-08: 2 via RECTAL
  Filled 2022-09-07: qty 2

## 2022-09-07 NOTE — Evaluation (Signed)
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 ([place]) 08/31/2022   History of CVA (cerebrovascular accident) 08/29/2022   Chronic anxiety 08/29/2022   Tobacco use 08/29/2022   Paroxysmal atrial fibrillation with RVR ([place]) 07/27/2022   Acute on chronic respiratory failure with hypoxia ([place]) 07/24/2022   Dyslipidemia 07/24/2022   Essential hypertension 07/24/2022   Sepsis due to pneumonia ([place]) 07/23/2022   Nausea vomiting and diarrhea 08/02/2015   Dyslipidemia associated with type 2 diabetes mellitus ([place]) 08/02/2015   Type 2 diabetes mellitus without complications ([place]) [id number]   Hyponatremia 02/22/2012   HTN (hypertension) 02/22/2012   Past Medical History:  Past Medical History:  Diagnosis Date   Bronchitis    COPD (chronic obstructive pulmonary disease) ([place])    Diabetes mellitus without complication ([place])    Hypertension    Lung cancer, primary, with metastasis from lung to other site, left ([place]) 09/04/2022   Past Surgical History:  Past Surgical History:  Procedure Laterality Date   CESAREAN SECTION     CHOLECYSTECTOMY     FOOT SURGERY     HERNIA REPAIR     IR IMAGING GUIDED PORT INSERTION  09/04/2022   HPI:  72 y.o. female, originally from [place], with medical history significant for COPD, Type 2 diabetes, CVA, hypertension atrial fibrillation on Eliquis, metastatic disease - lung cancer,  primary, presented to the hospital with increasing shortness of breath.  Of note, patient was recently admitted between  07/23/22 to 07/27/22 with sepsis secondary to community-acquired multifocal pneumonia.  She was discharged on 2 L nasal cannula at rest and up to 3 L nasal cannula on ambulation. Dx of pleural effusion, hypokalemia, Small acute infarct left frontal white matter.  Assessment / Plan / Recommendation Clinical Impression  Pt presents with normal expressive and receptive language with + ability to follow commands, discriminate among items in room, engage in conversation, name items to confrontation and engage in divergent naming tasks with no s/s of aphasia.  She is oriented x 4; basic memory and attention are WNL. Speech is clear without dysarthria and output is fluent. No SLP needs are identified. Pt agrees. Our service will sign off.    SLP Assessment  SLP Recommendation/Assessment: Patient does not need any further Speech [facility] SLP Visit Diagnosis: Cognitive communication deficit (R41.841)    Recommendations for follow up therapy are one component of a multi-disciplinary discharge planning process, led by the attending physician.  Recommendations may be updated based on patient status, additional functional criteria and insurance authorization.    Follow Up Recommendations  No SLP follow up    Assistance Recommended at Discharge  None                 SLP Evaluation Cognition  Overall Cognitive Status: Within Functional Limits for tasks assessed Arousal/Alertness: Awake/alert Orientation Level: Oriented X4 Attention: Selective Selective Attention: Appears intact Memory: Appears intact       Comprehension  Auditory Comprehension  Overall Auditory Comprehension: Appears within functional limits for tasks assessed Yes/No Questions: Within Functional Limits Commands: Within Functional Limits Conversation: Simple Visual  Recognition/Discrimination Discrimination: Within Function Limits Reading Comprehension Reading Status: Not tested    Expression Expression Primary Mode of Expression: Verbal Verbal Expression Overall Verbal Expression: Appears within functional limits for tasks assessed Initiation: No impairment Automatic Speech: Name;Social Response Level of Generative/Spontaneous Verbalization: Conversation Repetition: No impairment Naming: No impairment Written Expression Dominant Hand: Right Written Expression: Not tested   Oral / Motor  Oral Motor/Sensory Function Overall Oral Motor/Sensory Function: Within functional limits Motor Speech Overall Motor Speech: Appears within functional limits for tasks assessed            Juan Quam Laurice 09/07/2022, 11:32 AM Estill Bamberg L. Tivis Ringer, MA CCC/SLP Clinical Specialist - [place] Office number [phone]

## 2022-09-07 NOTE — Progress Notes (Signed)
PT Cancellation Note  Patient Details Name: Rhonda Blackwell MRN: [id number] DOB: [birth year]   Cancelled Treatment:    Reason Eval/Treat Not Completed: Medical issues which prohibited therapy Attempted x 2 to work with patient but patient not available-eating, CT being removed. Will check back another time [place] Office [phone] Weekend [phone]  Claretha Cooper 09/07/2022, 2:00 PM

## 2022-09-07 NOTE — Progress Notes (Signed)
NAMEKortnee Blackwell, MRN:  [id number], DOB:  08/10/1950, LOS: 9 ADMISSION DATE:  08/29/2022, CONSULTATION DATE: 08/29/2022 REFERRING MD: Dr. Flossie Buffy, CHIEF COMPLAINT: Shortness of breath, hypoxemic respiratory failure  History of Present Illness:  Patient presented to the hospital with shortness of breath Cough bringing up yellow phlegm, was recently hospitalized early February for pneumonia, sepsis Has had some chest tightness  Known to have A-fib, on anticoagulation  History of COPD, diabetes, hypertension, bronchitis  Recently diagnosed with metastatic cancer from a biopsy of scapula lesion  Known to have a left lung mass, recently had a bronchoscopy in January-patient does not recollect being told about cancer being found from that procedure, notes did not reveal endobronchial lesion on the left side.  Patient was dismissed from oncology practice secondary to multiple missed appointments-has not seen a pulmonologist in a year  She uses oxygen about 2 and half liters at home, same with exertion Uses inhalers  Pertinent  Medical History   Past Medical History:  Diagnosis Date   Bronchitis    COPD (chronic obstructive pulmonary disease) ([place])    Diabetes mellitus without complication ([place])    Hypertension    Lung cancer, primary, with metastasis from lung to other site, left ([place]) 09/04/2022    Significant Hospital Events: Including procedures, antibiotic start and stop dates in addition to other pertinent events   CT reviewed-left upper lobe mass, with probable lymphangitic spread, mediastinal adenopathy PET/CT 05/17/2022-hypermetabolic left upper lobe nodule, hypermetabolic left hilar and mediastinal lymph nodes, hypermetabolic left scapular lesion, hypermetabolic lesion in acetabulum 3/14 transferred to [facility] 3/15 received radiation therapy 3/17 A-fib with RVR, colonoscopy canceled 3/19: Left thoracostomy tube placed, complicated by what seems to be reexpansion pulmonary  edema treated with Lasix and positive pressure 3/20 improved respiratory status, starting chemotherapy.  Talc pleurodesis left CT   Interim History / Subjective:  Feels better, denies pain or SOB No output from chest tube overnight 4L   Objective   Blood pressure (!) 153/70, pulse 69, temperature 98.8 F (37.1 C), temperature source Oral, resp. rate 20, height 5\' 4"  (1.626 m), weight 66.5 kg, SpO2 99 %.        Intake/Output Summary (Last 24 hours) at 09/07/2022 1045 Last data filed at 09/07/2022 0700 Gross per 24 hour  Intake 1096.41 ml  Output 2800 ml  Net -1703.59 ml    Filed Weights   09/02/22 0526 09/03/22 1900 09/04/22 0600  Weight: 65.4 kg 65.4 kg 66.5 kg    Examination: General:  Older female sitting upright in bed in NAD HEENT: MM pink/moist Neuro: Alert, appropriate, MAE CV: irir, afib, rate 120, port right chest accessed PULM:  non labored, left scattered insp/ exp wheeze, right clear diminished, left posterior ct to waterseal, no airleak/ tidaling GI: soft, bs+, NT Extremities: warm/dry, no LE edema   No CT output over last 12hrs  UOP 2.7L /24hrs Net -560 ml  Resolved Hospital Problem list     Assessment & Plan:   Metastatic lung cancer w/ Progressive disease, metastasis to multiple bones Presumed malignant left pleural effusion COPD exacerbation Postobstructive pneumonia Acute on chronic hypoxic respiratory failure Atrial fibrillation Hyponatremia  Discussion - Oncology following - Receiving palliative radiation treatments - status post left tube thoracostomy on [id number], complicated by some degree of reexpansion pulmonary edema, better after IV Lasix and brief positive pressure.   Plan - CXR reviewed, overall stable  - no output in CT s/p talc pleurodesis 3/20.  Will d/c pigtail chest tube  today  - cont radiation and chemo per oncology  - cont dulera - wean supplemental O2 for goal sat > 92%, currently on 4L, much improved O2 needs   - cont  unasysn, stop date in place to cover for postobstructive PNA - cont diuresis per primary team  - ongoing pulmonary hygiene  - remainder per primary team    Nothing further to add.  PCCM will sign off.  Please call us back if we can be of any further assistance.     Kennieth Rad, MSN, AG-ACNP-BC [facility] 09/07/2022, 10:45 AM  See Amion for pager If no response to pager, please call PCCM consult pager After 7:00 pm call [place]

## 2022-09-07 NOTE — Progress Notes (Signed)
ANTICOAGULATION CONSULT NOTE - Follow Up Consult  Pharmacy Consult for Heparin Indication: atrial fibrillation (apixaban on hold)  Allergies  Allergen Reactions   Bystolic [Nebivolol Hcl] Nausea Only and Other (See Comments)    Abdominal pain   Hydrochlorothiazide Nausea Only and Swelling    Leg swelling   Norvasc [Amlodipine] Swelling    Leg swelling    Patient Measurements: Height: 5\' 4"  (162.6 cm) Weight: 66.5 kg (146 lb 9.7 oz) IBW/kg (Calculated) : 54.7 Heparin Dosing Weight: 65.4 kg  Vital Signs: Temp: 98.1 F (36.7 C) (03/21 0009) Temp Source: Axillary (03/21 0009) BP: 162/74 (03/21 0200) Pulse Rate: 77 (03/21 0200)  Labs: Recent Labs    09/04/22 0614 09/05/22 0313 09/05/22 0501 09/06/22 0650 09/06/22 0910 09/06/22 1700 09/07/22 0300  HGB 10.1* 9.3*  --  9.2*  --   --  9.5*  HCT 31.2* 29.5*  --  29.3*  --   --  29.9*  PLT 408* 383  --  380  --   --  407*  APTT 59* 66*  --   --   --   --   --   HEPARINUNFRC 0.89* 0.40   < >  --  0.38 0.28* 0.37  CREATININE 0.75 0.81  --  0.79  --   --   --    < > = values in this interval not displayed.     Estimated Creatinine Clearance: 60.5 mL/min (by C-G formula based on SCr of 0.79 mg/dL).   Medications:  Infusions:   ampicillin-sulbactam (UNASYN) IV Stopped (09/07/22 0037)   dexmedetomidine (PRECEDEX) IV infusion Stopped (09/06/22 1537)   heparin 1,050 Units/hr (09/06/22 1856)    Assessment: 39 yoF admitted on 3/12 with history of afib on Eliquis.  She had run out of Eliquis  at home for about a week, but was continued inpatient through 3/15 at 21:00, then held in anticipation of procedures. 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  GI was planning flexible sigmoidoscopy 3/17 to investigate abnormal PET scan findings, but was delayed d/t clinical instability.  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  Pharmacy consulted for heparin management by  Neurology for atrial fibrillation - neuro scale.   Significant Events: 3/17: heparin drip stopped at 0400 for planned flex sig; procedure cancelled due to change in cardiac and respiratory status. Heparin drip restarted at 0930 3/18 Heparin held briefly while in IR for Port placement 3/19 Heparin level (3/19 @2325 ) 0.29, still sub-therapeutic on heparin 900 units/hr and increased to 1000 units/hr   Today, 09/07/2022:  09:10 Heparin level within goal at 0.37 CBC: Hgb low but stable, plts high No bleeding per RN  Goal of Therapy:  Heparin Level 0.3-0.5 Monitor platelets by anticoagulation protocol: Yes   Plan:  - Continue heparin IV infusion at 1050 units/hr - Repeat confirmatory heparin level in 8 hr - Daily heparin level, CBC while on heparin - Follow up plans for potential reschedule of endoscopic procedure    Thank you for allowing pharmacy to be a part of this patient's care.  Dolly Rias RPh 09/07/2022, 3:57 AM

## 2022-09-07 NOTE — Progress Notes (Signed)
ANTICOAGULATION CONSULT NOTE - Follow Up Consult  Pharmacy Consult for Heparin Indication: atrial fibrillation (apixaban on hold)  Allergies  Allergen Reactions   Bystolic [Nebivolol Hcl] Nausea Only and Other (See Comments)    Abdominal pain   Hydrochlorothiazide Nausea Only and Swelling    Leg swelling   Norvasc [Amlodipine] Swelling    Leg swelling    Patient Measurements: Height: 5\' 4"  (162.6 cm) Weight: 66.5 kg (146 lb 9.7 oz) IBW/kg (Calculated) : 54.7 Heparin Dosing Weight: 65.4 kg  Vital Signs: Temp: 97.5 F (36.4 C) (03/21 1630) Temp Source: Oral (03/21 1630) BP: 137/74 (03/21 1800) Pulse Rate: 79 (03/21 1800)  Labs: Recent Labs    09/05/22 0313 09/05/22 0501 09/06/22 0650 09/06/22 0910 09/07/22 0300 09/07/22 0929 09/07/22 1030 09/07/22 1814  HGB 9.3*  --  9.2*  --  9.5*  --   --   --   HCT 29.5*  --  29.3*  --  29.9*  --   --   --   PLT 383  --  380  --  407*  --   --   --   APTT 66*  --   --   --   --   --   --   --   HEPARINUNFRC 0.40   < >  --    < > 0.37  --  0.30 0.28*  CREATININE 0.81  --  0.79  --   --  0.70  --   --    < > = values in this interval not displayed.     Estimated Creatinine Clearance: 60.5 mL/min (by C-G formula based on SCr of 0.7 mg/dL).   Medications:  Infusions:   sodium chloride Stopped (09/07/22 1514)   ampicillin-sulbactam (UNASYN) IV Stopped (09/07/22 1834)   dexmedetomidine (PRECEDEX) IV infusion Stopped (09/06/22 1537)   heparin 1,050 Units/hr (09/07/22 1800)    Assessment: 50 yoF admitted on 3/12 with history of afib on Eliquis.  She had run out of Eliquis  at home for about a week, but was continued inpatient through 3/15 at 21:00, then held in anticipation of procedures. 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  GI was planning flexible sigmoidoscopy 3/17 to investigate abnormal PET scan findings, but was delayed d/t clinical instability.  MRI 3/16 with small acute infarct in the  left frontal white matter.  Pharmacy consulted for heparin management by Neurology for atrial fibrillation - neuro scale.   Significant Events: 3/17: heparin drip stopped at 0400 for planned flex sig; procedure cancelled due to change in cardiac and respiratory status. Heparin drip restarted at 0930 3/18 Heparin held briefly while in IR for Port placement   Today, 09/07/2022:  Heparin level 0.28 on heparin 1050 units/hr No bleeding, interruptions, or complications per RN  Goal of Therapy:  Heparin Level 0.3-0.5 Monitor platelets by anticoagulation protocol: Yes   Plan:  - Increase heparin IV infusion to 1150 units/hr - Per Dr. Benson Norway, d/c heparin at 03:30 on 3/22 for flex sig at 07:30   Thank you for allowing pharmacy to be a part of this patient's care.  Peggyann Juba, PharmD, BCPS [facility] main pharmacy [phone] 09/07/2022 8:07 PM

## 2022-09-07 NOTE — Progress Notes (Signed)
PROGRESS NOTE    Rhonda Blackwell  [id number] DOB: August 29, 1950 DOA: 08/29/2022 PCP: [name], MD   Brief Narrative: 72 year old with past medical history significant for COPD, diabetes type 2, CVA, hypertension, A-fib on Eliquis, metastatic disease initially with unknown primary presented to hospital with worsening shortness of breath.  Of note recent admission from 07/23/2022 1 to 07/27/2022 with sepsis secondary to pneumonia multifocal.  Discharged on 2 L of oxygen.  She was also recently diagnosed with A-fib.  Since her diagnosis she has been having shortness of breath and not feeling well.  She has a history of hypermetabolic left upper lobe nodule as well as hypermetabolic hilar and mediastinal lymph nodes, left scapular lesion, area of right acetabulum and rectum concerning for metastatic disease on PET scan.  He was followed by [facility] pulmonology/oncology, underwent biopsy of left scapular lesion 06/08/2022 and bronchoscopy EBUS on 07/04/2022, she did not follow-up with oncology and multiple appointments.  She presented to ED and was found to be A-fib RVR.  Developed worsening hypoxia requiring up to 8 L of high flow oxygen.  CT chest showed masslike consolidation in the left upper lobe abutting the left hilar concerning for central obstructing neoplasm. peripheral areas of consolidation and interlobar septal thickening throughout the left upper lobe concerning for combination of postobstructive changes, regional metastatic disease and lymphangitic spread of tumor. Mediastinal and hilar adenopathy consistent with nodule metastasis. 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   Patient underwent thoracentesis and subsequently tube placement left pleural space on 3/19.  She is undergoing radiation therapy and has been a started on chemotherapy first dose 3/20  Assessment & Plan:   Principal Problem:   Acute on chronic respiratory  failure with hypoxia (HCC) Active Problems:   Type 2 diabetes mellitus without complications (HCC)   Essential hypertension   Hyponatremia   Paroxysmal atrial fibrillation with RVR (HCC)   History of CVA (cerebrovascular accident)   Chronic anxiety   Tobacco use   Malignant neoplasm of hilus of left lung (HCC)   Lung mass   Acute CVA (cerebrovascular accident) ([place])   Pleural effusion on left   Lung cancer, primary, with metastasis from lung to other site, left (HCC)   Hypocalcemia   Hypophosphatemia  1-Acute on Chronic Hypoxic Respiratory failure secondary to Left-sided Pleural Effusion, Lung mass: Flash Pulmonary edema, lung reexpansion.  -Acute on chronic respiratory failure likely multifactorial secondary to lung mass, postobstructive pneumonia, large left pleural effusion. -Patient oxygen requirement increased up to 15 L.  CCM consulted and underwent thoracentesis 3/17 yielding 800 cc of fluids. -CT chest showed masslike consolidation left upper lobe abutting left hilar concerning for central obstructing neoplasm. -On IV antibiotics to cover for postobstructive pneumonia. -Oncology following and concern is for adenocarcinoma of the lung. -Chest x-ray showed reaccumulation of pleural fluid.  Underwent left side pigtail catheter placement by CCM on 3/19. Underwent Chemical Pleurodesis via chest tube 3/20. -Continue with IV lasix.  -CCM has order to remove chest tube.   2-Metastatic cancer suspect primary Lung. Lung Mass. Left Pleural; effusion.  -Bone scan: Multifocal skeletal metastases involving the spine, ribs, left scapula and minimal involvement of the left hemipelvis. -CT head, neck: Multiple calvarial metastasis, largest located in the right parietal bone with transcortical extension. -Patient evaluated by radiation oncology, patient was a started on radiation treatments.  She will need to complete 2-week course of treatment -Dr. Marin Olp following. -Port cath placement 3/18  by  IR -Started  chemotherapy 3/20 -Pleural fluid cytology: Malignant cell presents, poorly differentiated carcinoma. Likely adenocarcinoma.   3-Hypokalemia; Replaced.   4-Small acute infarct left frontal white matter, prior recent history of CVA -MRI done on 09/02/2022 for staging show a small acute infarct in the left frontal white matter.  Chronic small vessel ischemia with multiple chronic lacunar infarct.  Multiple calvarial metastasis. -CT angio head and neck no large vessel occlusion. -Had a recent 2D echo.  This has not been repeated. -LDL: 51.  A1c 6.3. -Continue with the statins.  Currently on heparin drip.  Transition to Eliquis or Lovenox when no further procedures are planned.  5-A-fib RVR: On heparin drip. Intermittent episode of A-fib RVR.  Intermittently on Cardizem drip. On oral cardizem. Labetalol.  HR elevated this afternoon. Change Cardizem to 60 mg Q 6 hours.   6-Hypomagnesemia, hypophosphatemia and hypocalcemia Replaced  Hypertension: On Cardizem, Avapro, labetalol.   Tobacco use: Cessation encouraged  Diabetes type 2: A1c 6.3 Sliding scale insulin  Chronic anxiety: Continue with Cymbalta  Hypermetabolic activity in the rectum seen on PET scan: CEA elevated 1400 Supportive for sigmoidoscopy.  Procedure canceled due to A-fib RVR and worsening hypoxia  Hyponatremia: Continue with IV Lasix     Estimated body mass index is 25.16 kg/m as calculated from the following:   Height as of this encounter: 5\' 4"  (1.626 m).   Weight as of this encounter: 66.5 kg.   DVT prophylaxis: Heparin  Code Status: Full code Family Communication: care discussed with patient.  Disposition Plan:  Status is: Inpatient Remains inpatient appropriate because: management of metastatic cancer, malignancy     Consultants:  Radiation oncology Dr Marin Olp CCM Dr Benson [place] , GI Neurology   Procedures:  CT chest 08/29/2022 Chest x-ray 08/29/2022, 09/03/2022 MRI brain  09/02/2022 Bone scan 09/01/2022 Thoracentesis per PCCM, Dr.Olalere 09/03/2022 Port-A-Cath placement 09/04/2022  Antimicrobials:  IV Unasyn   Subjective: She is feeling better, dyspnea improved.  Denies pain   Objective: Vitals:   09/07/22 0700 09/07/22 0800 09/07/22 1000 09/07/22 1100  BP: 139/61 (!) 153/70 (!) 157/63 (!) 138/92  Pulse:  69 72 (!) 109  Resp: 17 20 20  (!) 22  Temp:  98.8 F (37.1 C)    TempSrc:  Oral    SpO2:  99% 96% 94%  Weight:      Height:        Intake/Output Summary (Last 24 hours) at 09/07/2022 1343 Last data filed at 09/07/2022 1200 Gross per 24 hour  Intake 751.14 ml  Output 2050 ml  Net -1298.86 ml   Filed Weights   09/02/22 0526 09/03/22 1900 09/04/22 0600  Weight: 65.4 kg 65.4 kg 66.5 kg    Examination:  General exam: NAD Respiratory system: Tachypnea, BL crackles.  Cardiovascular system: S 1, S 2 RRR Gastrointestinal system: BS present, soft, nt Central nervous system: Alert, oriented.  Extremities: Sno edema    Data Reviewed: I have personally reviewed following labs and imaging studies  CBC: Recent Labs  Lab 09/03/22 0351 09/04/22 0614 09/05/22 0313 09/06/22 0650 09/07/22 0300  WBC 11.0* 12.2* 9.4 7.8 7.8  NEUTROABS 9.4* 10.8*  --   --   --   HGB 11.1* 10.1* 9.3* 9.2* 9.5*  HCT 35.3* 31.2* 29.5* 29.3* 29.9*  MCV 77.6* 77.2* 79.9* 80.3 79.9*  PLT 456* 408* 383 380 [id number]*   Basic Metabolic Panel: Recent Labs  Lab 09/03/22 0351 09/04/22 0614 09/05/22 0313 09/05/22 0839 09/06/22 0650 09/07/22 0929  NA 132* 126* 127*  --  132* 135  K 3.6 3.5 3.1*  --  4.2 3.5  CL 94* 93* 98  --  103 104  CO2 26 25 23   --  21* 23  GLUCOSE 177* 131* 135*  --  131* 131*  BUN 13 14 18   --  13 12  CREATININE 0.79 0.75 0.81  --  0.79 0.70  CALCIUM 8.8* 7.5* 6.9* 7.0* 7.2* 7.1*  MG 2.0 2.0 2.3  --  2.2 2.2  PHOS  --  3.2 2.2*  --  2.6  --    GFR: Estimated Creatinine Clearance: 60.5 mL/min (by C-G formula based on SCr of 0.7  mg/dL). Liver Function Tests: Recent Labs  Lab 09/02/22 0524 09/04/22 0614 09/05/22 0313 09/06/22 0650  AST 13* 19 18 15   ALT 13 17 14 14   ALKPHOS 95 103 97 88  BILITOT 1.2 0.8 0.7 0.5  PROT 6.9 6.3* 5.9* 6.2*  ALBUMIN 3.0* 2.7* 2.5* 2.4*   No results for input(s): "LIPASE", "AMYLASE" in the last 168 hours. No results for input(s): "AMMONIA" in the last 168 hours. Coagulation Profile: No results for input(s): "INR", "PROTIME" in the last 168 hours. Cardiac Enzymes: No results for input(s): "CKTOTAL", "CKMB", "CKMBINDEX", "TROPONINI" in the last 168 hours. BNP (last 3 results) No results for input(s): "PROBNP" in the last 8760 hours. HbA1C: No results for input(s): "HGBA1C" in the last 72 hours. CBG: Recent Labs  Lab 09/06/22 0718 09/06/22 1215 09/06/22 1708 09/06/22 2125 09/07/22 0831  GLUCAP 139* 216* 164* 205* 126*   Lipid Profile: No results for input(s): "CHOL", "HDL", "LDLCALC", "TRIG", "CHOLHDL", "LDLDIRECT" in the last 72 hours. Thyroid Function Tests: Recent Labs    09/06/22 0910  TSH 1.644   Anemia Panel: No results for input(s): "VITAMINB12", "FOLATE", "FERRITIN", "TIBC", "IRON", "RETICCTPCT" in the last 72 hours. Sepsis Labs: Recent Labs  Lab 09/03/22 0351  LATICACIDVEN 1.3    Recent Results (from the past 240 hour(s))  Resp panel by RT-PCR (RSV, Flu A&B, Covid) Anterior Nasal Swab     Status: None   Collection Time: 08/29/22  2:07 PM   Specimen: Anterior Nasal Swab  Result Value Ref Range Status   SARS Coronavirus 2 by RT PCR NEGATIVE NEGATIVE Final   Influenza A by PCR NEGATIVE NEGATIVE Final   Influenza B by PCR NEGATIVE NEGATIVE Final    Comment: (NOTE) The Xpert Xpress SARS-CoV-2/FLU/RSV plus assay is intended as an aid in the diagnosis of influenza from Nasopharyngeal swab specimens and should not be used as a sole basis for treatment. Nasal washings and aspirates are unacceptable for Xpert Xpress SARS-CoV-2/FLU/RSV testing.  Fact  Sheet for Patients: EntrepreneurPulse.com.au  Fact Sheet for Healthcare Providers: IncredibleEmployment.be  This test is not yet approved or cleared by the [place] FDA and has been authorized for detection and/or diagnosis of SARS-CoV-2 by FDA under an Emergency Use Authorization (EUA). This EUA will remain in effect (meaning this test can be used) for the duration of the COVID-19 declaration under Section 564(b)(1) of the Act, 21 U.S.C. section 360bbb-3(b)(1), unless the authorization is terminated or revoked.     Resp Syncytial Virus by PCR NEGATIVE NEGATIVE Final    Comment: (NOTE) Fact Sheet for Patients: EntrepreneurPulse.com.au  Fact Sheet for Healthcare Providers: IncredibleEmployment.be  This test is not yet approved or cleared by the [place] FDA and has been authorized for detection and/or diagnosis of SARS-CoV-2 by FDA under an Emergency Use Authorization (EUA). This EUA will remain in effect (meaning this test can  be used) for the duration of the COVID-19 declaration under Section 564(b)(1) of the Act, 21 U.S.C. section 360bbb-3(b)(1), unless the authorization is terminated or revoked.  Performed at [facility], [place] [address], [place], [place] [address]   Expectorated Sputum Assessment w Gram Stain, Rflx to Resp Cult     Status: None   Collection Time: 08/29/22  9:22 PM   Specimen: Expectorated Sputum  Result Value Ref Range Status   Specimen Description EXPECTORATED SPUTUM  Final   Special Requests NONE  Final   Sputum evaluation   Final    THIS SPECIMEN IS ACCEPTABLE FOR SPUTUM CULTURE Performed at [facility], [place] [address], [place], [place] [address]    Report Status 09/05/2022 FINAL  Final  Culture, Respiratory w Gram Stain     Status: None   Collection Time: 08/29/22  9:22 PM  Result Value Ref Range Status   Specimen Description EXPECTORATED SPUTUM  Final    Special Requests NONE Reflexed from [id number]  Final   Gram Stain   Final    RARE WBC PRESENT, PREDOMINANTLY MONONUCLEAR NO ORGANISMS SEEN    Culture   Final    RARE Normal respiratory flora-no Staph aureus or Pseudomonas seen Performed at [facility], [place] [address], [place], [place] [address]    Report Status 09/03/2022 FINAL  Final  Body fluid culture w Gram Stain     Status: None   Collection Time: 09/03/22  3:03 PM   Specimen: Pleural Fluid  Result Value Ref Range Status   Specimen Description   Final    PLEURAL Performed at [place] [address] [place], [place] [address]    Special Requests   Final    NONE Performed at [facility], [place] [address], [place], [place] [address]    Gram Stain   Final    RARE WBC PRESENT, PREDOMINANTLY PMN NO ORGANISMS SEEN    Culture   Final    NO GROWTH 3 DAYS Performed at [place] [address], [place], [place] [address]    Report Status 09/06/2022 FINAL  Final  MRSA Next Gen by PCR, Nasal     Status: None   Collection Time: 09/03/22  7:39 PM   Specimen: Nasal Mucosa; Nasal Swab  Result Value Ref Range Status   MRSA by PCR Next Gen NOT DETECTED NOT DETECTED Final    Comment: (NOTE) The GeneXpert MRSA Assay (FDA approved for NASAL specimens only), is one component of a comprehensive MRSA colonization surveillance program. It is not intended to diagnose MRSA infection nor to guide or monitor treatment for MRSA infections. Test performance is not FDA approved in patients less than 57 years old. Performed at [facility], [place] [address], [place], [place] [address]          Radiology Studies: [facility] Chest Port 1 View  Result Date: 09/07/2022 CLINICAL DATA:  [id number] with pneumonia, left chest tube. EXAM: PORTABLE CHEST 1 VIEW COMPARISON:  Portable chest yesterday at 4:47 a.m. FINDINGS: 4:39 a.m. There is stable cardiomegaly with mild vascular  prominence and interstitial edema. Left upper lobe perihilar opacity extending to the periphery is again noted consistent with pneumonia or mass. Left lower lobe consolidation appears similar with left pleural effusion and unchanged positioning of pigtail left chest tube. There is no visible pneumothorax. No consolidation is seen on the right. Right IJ port catheter positioning is stable in the distal SVC.  Stable mediastinum with calcification and tortuosity of aorta. Overall aeration seems unchanged.  No new abnormality. IMPRESSION: No significant change in the appearance of the chest. Left upper lobe perihilar opacity extending to the periphery is again noted consistent with pneumonia or mass. Left lower lobe consolidation and left pleural effusion appear similar. Cardiomegaly with mild interstitial edema. Electronically Signed   By: Telford Nab M.D.   On: 09/07/2022 06:41   DG Chest Port 1 View  Result Date: 09/06/2022 CLINICAL DATA:  [id number] with pleural effusion and left pigtail chest tube. EXAM: PORTABLE CHEST 1 VIEW COMPARISON:  Lat chest yesterday at 12:09 p.m. FINDINGS: 4:47 a.m. Pigtail left chest tube positioning is stable in the lower lateral left thorax. There is no visible pneumothorax. There is mild cardiomegaly. The aorta is tortuous with calcification in the transverse segment. Stable mediastinum. Small increased left pleural effusion noted today with increased left lower lobe opacity consistent with atelectasis, pneumonia or aspiration. Left perihilar infiltrate or mass appears similar. The lungs show increased interstitial markings which could be due to edema or pneumonitis. Stable small right pleural effusion. Right-sided aeration is unchanged with no focal consolidation. There is osteopenia and thoracic spondylosis. IMPRESSION: 1. Increased left pleural effusion and left lower lobe opacity consistent with atelectasis, pneumonia or aspiration. 2. Left perihilar infiltrate or mass appears  similar. 3. Increased interstitial markings which could be due to edema or pneumonitis. 4. Stable small right pleural effusion.  Cardiomegaly. 5. Aortic atherosclerosis and uncoiling. Electronically Signed   By: Telford Nab M.D.   On: 09/06/2022 07:12   IR IMAGING GUIDED PORT INSERTION  Result Date: 09/04/2022 INDICATION: 72 year old with metastatic lung cancer. Port-A-Cath needed to assist with patient's treatment. EXAM: FLUOROSCOPIC AND ULTRASOUND GUIDED PLACEMENT OF A SUBCUTANEOUS PORT COMPARISON:  None Available. MEDICATIONS: Moderate sedation ANESTHESIA/SEDATION: Moderate (conscious) sedation was employed during this procedure. A total of Versed 0.5 mg and fentanyl 25 mcg was administered intravenously at the order of the provider performing the procedure. Total intra-service moderate sedation time: 24 minutes. Patient's level of consciousness and vital signs were monitored continuously by radiology nurse throughout the procedure under the supervision of the provider performing the procedure. FLUOROSCOPY TIME:  Radiation Exposure Index (as provided by the fluoroscopic device): 1 mGy Kerma COMPLICATIONS: None immediate. PROCEDURE: The procedure, risks, benefits, and alternatives were explained to the patient/family. Questions regarding the procedure were encouraged and answered. Informed consent was obtained for the procedure. Patient was placed supine on the interventional table. Ultrasound confirmed a patent right internal jugular vein. Ultrasound image was saved for documentation. The right chest and neck were cleaned with a skin antiseptic and a sterile drape was placed. Maximal barrier sterile technique was utilized including caps, mask, sterile gowns, sterile gloves, sterile drape, hand hygiene and skin antiseptic. The right neck was anesthetized with 1% lidocaine. Small incision was made in the right neck with a blade. Micropuncture set was placed in the right internal jugular vein with ultrasound  guidance. The micropuncture wire was used for measurement purposes. The right chest was anesthetized with 1% lidocaine with epinephrine. #15 blade was used to make an incision and a subcutaneous port pocket was formed. Capulin was assembled. Subcutaneous tunnel was formed with a stiff tunneling device. The port catheter was brought through the subcutaneous tunnel. The port was placed in the subcutaneous pocket. The micropuncture set was exchanged for a peel-away sheath. The catheter was placed through the peel-away sheath and the tip was positioned at the superior  cavoatrial junction. Catheter placement was confirmed with fluoroscopy. The port was accessed and flushed with saline. The port pocket was closed using two layers of absorbable sutures and Dermabond. The vein skin site was closed using a single layer of absorbable suture and Dermabond. Sterile dressings were applied. Patient tolerated the procedure well without an immediate complication. Ultrasound and fluoroscopic images were taken and saved for this procedure. IMPRESSION: Placement of a subcutaneous power-injectable port device. Catheter tip at the superior cavoatrial junction. Electronically Signed   By: Markus Daft M.D.   On: 09/04/2022 17:45        Scheduled Meds:  atorvastatin  40 mg Oral Daily   Chlorhexidine Gluconate Cloth  6 each Topical Daily   diltiazem  60 mg Oral Q6H   dronabinol  2.5 mg Oral BID AC   DULoxetine  60 mg Oral BID   feeding supplement  237 mL Oral BID BM   fluticasone  2 spray Each Nare Daily   furosemide  40 mg Intravenous Daily   guaiFENesin  600 mg Oral BID   insulin aspart  0-6 Units Subcutaneous TID WC   irbesartan  150 mg Oral BID   labetalol  100 mg Oral BID   magnesium oxide  400 mg Oral BID   mometasone-formoterol  2 puff Inhalation BID   pantoprazole  40 mg Oral Daily   phosphorus  500 mg Oral BID   potassium chloride  40 mEq Oral BID   Continuous Infusions:  sodium chloride 10  mL/hr at 09/07/22 0700   ampicillin-sulbactam (UNASYN) IV Stopped (09/07/22 1219)   dexmedetomidine (PRECEDEX) IV infusion Stopped (09/06/22 1537)   heparin 1,050 Units/hr (09/07/22 0700)     LOS: 9 days    Time spent: 35 minutes    Rhonda Westbrook A Tadarius Maland, MD [facility]   If 7PM-7AM, please contact night-coverage [email or web address]  09/07/2022, 1:43 PM

## 2022-09-07 NOTE — Progress Notes (Signed)
Subjective: No complaints.  Objective: Vital signs in last 24 hours: Temp:  [98 F (36.7 C)-98.8 F (37.1 C)] 98.5 F (36.9 C) (03/21 1150) Pulse Rate:  [69-109] 109 (03/21 1100) Resp:  [14-22] 22 (03/21 1100) BP: (126-164)/(47-117) 138/92 (03/21 1100) SpO2:  [94 %-99 %] 94 % (03/21 1100) Last BM Date : 09/07/22  Intake/Output from previous day: 03/20 0701 - 03/21 0700 In: 1858.6 [I.V.:827.2; IV Piggyback:1016.4] Out: 2800 [Urine:2700; Chest Tube:100] Intake/Output this shift: Total I/O In: 240 [P.O.:240] Out: 900 [Urine:900]  General appearance: alert and no distress GI: soft, non-tender; bowel sounds normal; no masses,  no organomegaly  Lab Results: Recent Labs    09/05/22 0313 09/06/22 0650 09/07/22 0300  WBC 9.4 7.8 7.8  HGB 9.3* 9.2* 9.5*  HCT 29.5* 29.3* 29.9*  PLT 383 380 407*   BMET Recent Labs    09/05/22 0313 09/05/22 0839 09/06/22 0650 09/07/22 0929  NA 127*  --  132* 135  K 3.1*  --  4.2 3.5  CL 98  --  103 104  CO2 23  --  21* 23  GLUCOSE 135*  --  131* 131*  BUN 18  --  13 12  CREATININE 0.81  --  0.79 0.70  CALCIUM 6.9* 7.0* 7.2* 7.1*   LFT Recent Labs    09/06/22 0650  PROT 6.2*  ALBUMIN 2.4*  AST 15  ALT 14  ALKPHOS 88  BILITOT 0.5   PT/INR No results for input(s): "LABPROT", "INR" in the last 72 hours. Hepatitis Panel No results for input(s): "HEPBSAG", "HCVAB", "HEPAIGM", "HEPBIGM" in the last 72 hours. C-Diff No results for input(s): "CDIFFTOX" in the last 72 hours. Fecal Lactopherrin No results for input(s): "FECLLACTOFRN" in the last 72 hours.  Studies/Results: DG Chest Port 1 View  Result Date: 09/07/2022 CLINICAL DATA:  [id number] with pneumonia, left chest tube. EXAM: PORTABLE CHEST 1 VIEW COMPARISON:  Portable chest yesterday at 4:47 a.m. FINDINGS: 4:39 a.m. There is stable cardiomegaly with mild vascular prominence and interstitial edema. Left upper lobe perihilar opacity extending to the periphery is again noted  consistent with pneumonia or mass. Left lower lobe consolidation appears similar with left pleural effusion and unchanged positioning of pigtail left chest tube. There is no visible pneumothorax. No consolidation is seen on the right. Right IJ port catheter positioning is stable in the distal SVC. Stable mediastinum with calcification and tortuosity of aorta. Overall aeration seems unchanged.  No new abnormality. IMPRESSION: No significant change in the appearance of the chest. Left upper lobe perihilar opacity extending to the periphery is again noted consistent with pneumonia or mass. Left lower lobe consolidation and left pleural effusion appear similar. Cardiomegaly with mild interstitial edema. Electronically Signed   By: Telford Nab M.D.   On: 09/07/2022 06:41   DG Chest Port 1 View  Result Date: 09/06/2022 CLINICAL DATA:  [id number] with pleural effusion and left pigtail chest tube. EXAM: PORTABLE CHEST 1 VIEW COMPARISON:  Lat chest yesterday at 12:09 p.m. FINDINGS: 4:47 a.m. Pigtail left chest tube positioning is stable in the lower lateral left thorax. There is no visible pneumothorax. There is mild cardiomegaly. The aorta is tortuous with calcification in the transverse segment. Stable mediastinum. Small increased left pleural effusion noted today with increased left lower lobe opacity consistent with atelectasis, pneumonia or aspiration. Left perihilar infiltrate or mass appears similar. The lungs show increased interstitial markings which could be due to edema or pneumonitis. Stable small right pleural effusion. Right-sided aeration is unchanged with  no focal consolidation. There is osteopenia and thoracic spondylosis. IMPRESSION: 1. Increased left pleural effusion and left lower lobe opacity consistent with atelectasis, pneumonia or aspiration. 2. Left perihilar infiltrate or mass appears similar. 3. Increased interstitial markings which could be due to edema or pneumonitis. 4. Stable small right  pleural effusion.  Cardiomegaly. 5. Aortic atherosclerosis and uncoiling. Electronically Signed   By: Telford Nab M.D.   On: 09/06/2022 07:12   IR IMAGING GUIDED PORT INSERTION  Result Date: 09/04/2022 INDICATION: 72 year old with metastatic lung cancer. Port-A-Cath needed to assist with patient's treatment. EXAM: FLUOROSCOPIC AND ULTRASOUND GUIDED PLACEMENT OF A SUBCUTANEOUS PORT COMPARISON:  None Available. MEDICATIONS: Moderate sedation ANESTHESIA/SEDATION: Moderate (conscious) sedation was employed during this procedure. A total of Versed 0.5 mg and fentanyl 25 mcg was administered intravenously at the order of the provider performing the procedure. Total intra-service moderate sedation time: 24 minutes. Patient's level of consciousness and vital signs were monitored continuously by radiology nurse throughout the procedure under the supervision of the provider performing the procedure. FLUOROSCOPY TIME:  Radiation Exposure Index (as provided by the fluoroscopic device): 1 mGy Kerma COMPLICATIONS: None immediate. PROCEDURE: The procedure, risks, benefits, and alternatives were explained to the patient/family. Questions regarding the procedure were encouraged and answered. Informed consent was obtained for the procedure. Patient was placed supine on the interventional table. Ultrasound confirmed a patent right internal jugular vein. Ultrasound image was saved for documentation. The right chest and neck were cleaned with a skin antiseptic and a sterile drape was placed. Maximal barrier sterile technique was utilized including caps, mask, sterile gowns, sterile gloves, sterile drape, hand hygiene and skin antiseptic. The right neck was anesthetized with 1% lidocaine. Small incision was made in the right neck with a blade. Micropuncture set was placed in the right internal jugular vein with ultrasound guidance. The micropuncture wire was used for measurement purposes. The right chest was anesthetized with 1%  lidocaine with epinephrine. #15 blade was used to make an incision and a subcutaneous port pocket was formed. [place] was assembled. Subcutaneous tunnel was formed with a stiff tunneling device. The port catheter was brought through the subcutaneous tunnel. The port was placed in the subcutaneous pocket. The micropuncture set was exchanged for a peel-away sheath. The catheter was placed through the peel-away sheath and the tip was positioned at the superior cavoatrial junction. Catheter placement was confirmed with fluoroscopy. The port was accessed and flushed with saline. The port pocket was closed using two layers of absorbable sutures and Dermabond. The vein skin site was closed using a single layer of absorbable suture and Dermabond. Sterile dressings were applied. Patient tolerated the procedure well without an immediate complication. Ultrasound and fluoroscopic images were taken and saved for this procedure. IMPRESSION: Placement of a subcutaneous power-injectable port device. Catheter tip at the superior cavoatrial junction. Electronically Signed   By: Markus Daft M.D.   On: 09/04/2022 17:45    Medications: Scheduled:  atorvastatin  40 mg Oral Daily   Chlorhexidine Gluconate Cloth  6 each Topical Daily   diltiazem  60 mg Oral Q6H   dronabinol  2.5 mg Oral BID AC   DULoxetine  60 mg Oral BID   feeding supplement  237 mL Oral BID BM   fluticasone  2 spray Each Nare Daily   furosemide  40 mg Intravenous Daily   guaiFENesin  600 mg Oral BID   insulin aspart  0-6 Units Subcutaneous TID WC   irbesartan  150 mg  Oral BID   labetalol  100 mg Oral BID   magnesium oxide  400 mg Oral BID   mometasone-formoterol  2 puff Inhalation BID   pantoprazole  40 mg Oral Daily   phosphorus  500 mg Oral BID   potassium chloride  40 mEq Oral BID   [START ON 09/08/2022] sodium phosphate  2 enema Rectal Once   Continuous:  sodium chloride 10 mL/hr at 09/07/22 0700   ampicillin-sulbactam (UNASYN) IV  Stopped (09/07/22 1219)   dexmedetomidine (PRECEDEX) IV infusion Stopped (09/06/22 1537)   heparin 1,050 Units/hr (09/07/22 1453)    Assessment/Plan: 1) Abnormal PET scan. 2) Metastatic malignancy - ? Lung primary 3) Acute on Chronic respiratory distress - improved. 4) Afib - controlled.   Nursing reports that her oxygen saturation improved and she maintains an oxygen saturation in the mid to upper 90's.  Her afib is under control.  Plan: 1) FFS tomorrow.  LOS: 9 days   Taylor Levick D 09/07/2022, 3:07 PM

## 2022-09-07 NOTE — Progress Notes (Signed)
RT came to give pt flutter. She is not in the room at this time.

## 2022-09-07 NOTE — Progress Notes (Signed)
ANTICOAGULATION CONSULT NOTE - Follow Up Consult  Pharmacy Consult for Heparin Indication: atrial fibrillation (apixaban on hold)  Allergies  Allergen Reactions   Bystolic [Nebivolol Hcl] Nausea Only and Other (See Comments)    Abdominal pain   Hydrochlorothiazide Nausea Only and Swelling    Leg swelling   Norvasc [Amlodipine] Swelling    Leg swelling    Patient Measurements: Height: 5\' 4"  (162.6 cm) Weight: 66.5 kg (146 lb 9.7 oz) IBW/kg (Calculated) : 54.7 Heparin Dosing Weight: 65.4 kg  Vital Signs: Temp: 98 F (36.7 C) (03/21 0300) Temp Source: Axillary (03/21 0300) BP: 139/61 (03/21 0700) Pulse Rate: 73 (03/21 0500)  Labs: Recent Labs    09/05/22 0313 09/05/22 0501 09/06/22 0650 09/06/22 0910 09/06/22 1700 09/07/22 0300  HGB 9.3*  --  9.2*  --   --  9.5*  HCT 29.5*  --  29.3*  --   --  29.9*  PLT 383  --  380  --   --  407*  APTT 66*  --   --   --   --   --   HEPARINUNFRC 0.40   < >  --  0.38 0.28* 0.37  CREATININE 0.81  --  0.79  --   --   --    < > = values in this interval not displayed.     Estimated Creatinine Clearance: 60.5 mL/min (by C-G formula based on SCr of 0.79 mg/dL).   Medications:  Infusions:   sodium chloride 10 mL/hr at 09/07/22 0700   ampicillin-sulbactam (UNASYN) IV Stopped (09/07/22 0650)   dexmedetomidine (PRECEDEX) IV infusion Stopped (09/06/22 1537)   heparin 1,050 Units/hr (09/07/22 0700)    Assessment: 3 yoF admitted on 3/12 with history of afib on Eliquis.  She had run out of Eliquis  at home for about a week, but was continued inpatient through 3/15 at 21:00, then held in anticipation of procedures. 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  GI was planning flexible sigmoidoscopy 3/17 to investigate abnormal PET scan findings, but was delayed d/t clinical instability.  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  Pharmacy consulted for heparin management by Neurology  for atrial fibrillation - neuro scale.   Significant Events: 3/17: heparin drip stopped at 0400 for planned flex sig; procedure cancelled due to change in cardiac and respiratory status. Heparin drip restarted at 0930 3/18 Heparin held briefly while in IR for Port placement   Today, 09/07/2022:  Heparin level 0.3 on heparin 1050 units/hr CBC: Hgb low but stable at 9.5, plts elevated No bleeding, interruptions, or complications per RN  Goal of Therapy:  Heparin Level 0.3-0.5 Monitor platelets by anticoagulation protocol: Yes   Plan:  - Continue heparin IV infusion at 1050 units/hr - Repeat confirmatory heparin level in 8 hr - Daily heparin level, CBC while on heparin - Follow up plans for potential reschedule of flex sig on 3/22.   Thank you for allowing pharmacy to be a part of this patient's care.  Gretta Arab PharmD, BCPS [facility] main pharmacy [phone] 09/07/2022 11:35 AM

## 2022-09-07 NOTE — Plan of Care (Signed)
Problem: Education: Goal: Ability to describe self-care measures that may prevent or decrease complications (Diabetes Survival Skills Education) will improve Outcome: Progressing Goal: Individualized Educational Video(s) Outcome: Progressing   Problem: Coping: Goal: Ability to adjust to condition or change in health will improve Outcome: Progressing   Problem: Fluid Volume: Goal: Ability to maintain a balanced intake and output will improve Outcome: Progressing   Problem: Health Behavior/Discharge Planning: Goal: Ability to identify and utilize available resources and services will improve Outcome: Progressing Goal: Ability to manage health-related needs will improve Outcome: Progressing   Problem: Metabolic: Goal: Ability to maintain appropriate glucose levels will improve Outcome: Progressing   Problem: Nutritional: Goal: Maintenance of adequate nutrition will improve Outcome: Progressing Goal: Progress toward achieving an optimal weight will improve Outcome: Progressing   Problem: Skin Integrity: Goal: Risk for impaired skin integrity will decrease Outcome: Progressing   Problem: Tissue Perfusion: Goal: Adequacy of tissue perfusion will improve Outcome: Progressing   Problem: Health Behavior/Discharge Planning: Goal: Ability to manage health-related needs will improve Outcome: Progressing   Problem: Clinical Measurements: Goal: Ability to maintain clinical measurements within normal limits will improve Outcome: Progressing Goal: Will remain free from infection Outcome: Progressing Goal: Diagnostic test results will improve Outcome: Progressing Goal: Respiratory complications will improve Outcome: Progressing Goal: Cardiovascular complication will be avoided Outcome: Progressing   Problem: Activity: Goal: Risk for activity intolerance will decrease Outcome: Progressing   Problem: Nutrition: Goal: Adequate nutrition will be maintained Outcome:  Progressing   Problem: Coping: Goal: Level of anxiety will decrease Outcome: Progressing   Problem: Elimination: Goal: Will not experience complications related to bowel motility Outcome: Progressing Goal: Will not experience complications related to urinary retention Outcome: Progressing   Problem: Pain Managment: Goal: General experience of comfort will improve Outcome: Progressing   Problem: Safety: Goal: Ability to remain free from injury will improve Outcome: Progressing   Problem: Skin Integrity: Goal: Risk for impaired skin integrity will decrease Outcome: Progressing   Problem: Education: Goal: Knowledge of General Education information will improve Description: Including pain rating scale, medication(s)/side effects and non-pharmacologic comfort measures Outcome: Progressing   Problem: Health Behavior/Discharge Planning: Goal: Ability to manage health-related needs will improve Outcome: Progressing   Problem: Clinical Measurements: Goal: Ability to maintain clinical measurements within normal limits will improve Outcome: Progressing Goal: Will remain free from infection Outcome: Progressing Goal: Diagnostic test results will improve Outcome: Progressing Goal: Respiratory complications will improve Outcome: Progressing Goal: Cardiovascular complication will be avoided Outcome: Progressing   Problem: Activity: Goal: Risk for activity intolerance will decrease Outcome: Progressing   Problem: Nutrition: Goal: Adequate nutrition will be maintained Outcome: Progressing   Problem: Coping: Goal: Level of anxiety will decrease Outcome: Progressing   Problem: Elimination: Goal: Will not experience complications related to bowel motility Outcome: Progressing Goal: Will not experience complications related to urinary retention Outcome: Progressing   Problem: Pain Managment: Goal: General experience of comfort will improve Outcome: Progressing   Problem:  Safety: Goal: Ability to remain free from injury will improve Outcome: Progressing   Problem: Skin Integrity: Goal: Risk for impaired skin integrity will decrease Outcome: Progressing   Problem: Education: Goal: Knowledge of disease or condition will improve Outcome: Progressing Goal: Knowledge of secondary prevention will improve (MUST DOCUMENT ALL) Outcome: Progressing Goal: Knowledge of patient specific risk factors will improve [place] N/A or DELETE if not current risk factor) Outcome: Progressing   Problem: Ischemic Stroke/TIA Tissue Perfusion: Goal: Complications of ischemic stroke/TIA will be minimized Outcome: Progressing   Problem: Coping:  Goal: Will verbalize positive feelings about self Outcome: Progressing Goal: Will identify appropriate support needs Outcome: Progressing

## 2022-09-08 ENCOUNTER — Other Ambulatory Visit: Payer: Self-pay

## 2022-09-08 ENCOUNTER — Inpatient Hospital Stay (HOSPITAL_COMMUNITY): Payer: Medicare Other | Admitting: Anesthesiology

## 2022-09-08 ENCOUNTER — Encounter (HOSPITAL_COMMUNITY)
Admission: EM | Disposition: A | Discharge status: Skilled Nursing Facility | Payer: Self-pay | Source: Home / Self Care | Attending: Internal Medicine

## 2022-09-08 ENCOUNTER — Encounter (HOSPITAL_COMMUNITY): Payer: Self-pay | Admitting: Family Medicine

## 2022-09-08 ENCOUNTER — Ambulatory Visit
Admission: RE | Admit: 2022-09-08 | Discharge: 2022-09-08 | Disposition: A | Discharge status: Home / Self Care | Payer: Medicare Other | Source: Ambulatory Visit | Attending: Radiation Oncology | Admitting: Radiation Oncology

## 2022-09-08 ENCOUNTER — Ambulatory Visit: Payer: Medicare Other

## 2022-09-08 DIAGNOSIS — I4891 Unspecified atrial fibrillation: Secondary | ICD-10-CM

## 2022-09-08 DIAGNOSIS — F17210 Nicotine dependence, cigarettes, uncomplicated: Secondary | ICD-10-CM

## 2022-09-08 DIAGNOSIS — E119 Type 2 diabetes mellitus without complications: Secondary | ICD-10-CM

## 2022-09-08 DIAGNOSIS — J449 Chronic obstructive pulmonary disease, unspecified: Secondary | ICD-10-CM | POA: Diagnosis not present

## 2022-09-08 DIAGNOSIS — R933 Abnormal findings on diagnostic imaging of other parts of digestive tract: Secondary | ICD-10-CM | POA: Diagnosis not present

## 2022-09-08 DIAGNOSIS — Z794 Long term (current) use of insulin: Secondary | ICD-10-CM

## 2022-09-08 DIAGNOSIS — I10 Essential (primary) hypertension: Secondary | ICD-10-CM

## 2022-09-08 DIAGNOSIS — J9621 Acute and chronic respiratory failure with hypoxia: Secondary | ICD-10-CM | POA: Diagnosis not present

## 2022-09-08 HISTORY — PX: FLEXIBLE SIGMOIDOSCOPY: SHX5431

## 2022-09-08 LAB — RAD ONC ARIA SESSION SUMMARY
Course Elapsed Days: 7
Plan Fractions Treated to Date: 5
Plan Prescribed Dose Per Fraction: 3 Gy
Plan Total Fractions Prescribed: 8
Plan Total Prescribed Dose: 24 Gy
Reference Point Dosage Given to Date: 15 Gy
Reference Point Session Dosage Given: 3 Gy
Session Number: 6

## 2022-09-08 LAB — GLUCOSE, CAPILLARY
Glucose-Capillary: 188 mg/dL — ABNORMAL HIGH (ref 70–99)
Glucose-Capillary: 227 mg/dL — ABNORMAL HIGH (ref 70–99)
Glucose-Capillary: 137 mg/dL — ABNORMAL HIGH (ref 70–99)
Glucose-Capillary: 138 mg/dL — ABNORMAL HIGH (ref 70–99)
Glucose-Capillary: 144 mg/dL — ABNORMAL HIGH (ref 70–99)
Glucose-Capillary: 187 mg/dL — ABNORMAL HIGH (ref 70–99)

## 2022-09-08 LAB — BASIC METABOLIC PANEL
Anion gap: 10 (ref 5–15)
BUN: 13 mg/dL (ref 8–23)
CO2: 21 mmol/L — ABNORMAL LOW (ref 22–32)
Calcium: 7 mg/dL — ABNORMAL LOW (ref 8.9–10.3)
Chloride: 104 mmol/L (ref 98–111)
Creatinine, Ser: 0.74 mg/dL (ref 0.44–1.00)
GFR, Estimated: >60 mL/min (ref >60)
Glucose, Bld: 131 mg/dL — ABNORMAL HIGH (ref 70–99)
Potassium: 4 mmol/L (ref 3.5–5.1)
Sodium: 135 mmol/L (ref 135–145)

## 2022-09-08 LAB — CBC
HCT: 31.3 % — ABNORMAL LOW (ref 36.0–46.0)
Hemoglobin: 9.8 g/dL — ABNORMAL LOW (ref 12.0–15.0)
MCH: 24.7 pg — ABNORMAL LOW (ref 26.0–34.0)
MCHC: 31.3 g/dL (ref 30.0–36.0)
MCV: 78.8 fL — ABNORMAL LOW (ref 80.0–100.0)
Platelets: 434 10*3/uL — ABNORMAL HIGH (ref 150–400)
RBC: 3.97 MIL/uL (ref 3.87–5.11)
RDW: 14.1 % (ref 11.5–15.5)
WBC: 9.6 10*3/uL (ref 4.0–10.5)
nRBC: 0 % (ref 0.0–0.2)

## 2022-09-08 LAB — CYTOLOGY - NON PAP

## 2022-09-08 SURGERY — SIGMOIDOSCOPY, FLEXIBLE
Anesthesia: Monitor Anesthesia Care

## 2022-09-08 MED ORDER — LACTATED RINGERS IV SOLN
INTRAVENOUS | Status: AC | PRN
  Administered 2022-09-08: 1000 mL via INTRAVENOUS

## 2022-09-08 MED ORDER — PROPOFOL 500 MG/50ML IV EMUL
INTRAVENOUS | Status: DC | PRN
  Administered 2022-09-08: 125 ug/kg/min via INTRAVENOUS

## 2022-09-08 MED ORDER — HEPARIN (PORCINE) 25000 UT/250ML-% IV SOLN: 1150.0000 [IU]/h | INTRAVENOUS | Status: DC

## 2022-09-08 MED ORDER — LACTATED RINGERS IV SOLN: INTRAVENOUS | Status: DC | PRN

## 2022-09-08 MED ORDER — DEXTROSE IN LACTATED RINGERS 5 % IV SOLN: INTRAVENOUS | Status: DC

## 2022-09-08 MED ORDER — SODIUM CHLORIDE 0.9 % IV SOLN: INTRAVENOUS | Status: DC

## 2022-09-08 MED ORDER — MELATONIN 3 MG PO TABS
6.0000 mg | ORAL_TABLET | Freq: Every evening | ORAL | Status: AC | PRN
  Administered 2022-09-08 – 2022-09-09 (×2): 6 mg via ORAL
  Filled 2022-09-08 (×2): qty 2

## 2022-09-08 MED ORDER — APIXABAN 5 MG PO TABS
5.0000 mg | ORAL_TABLET | Freq: Two times a day (BID) | ORAL | Status: DC
  Administered 2022-09-08 – 2022-09-14 (×13): 5 mg via ORAL
  Filled 2022-09-08 (×13): qty 1

## 2022-09-08 NOTE — Anesthesia Procedure Notes (Signed)
Procedure Name: MAC Date/Time: 09/08/2022 12:47 PM  Performed by: Cynda Familia, CRNAPre-anesthesia Checklist: Patient identified, Emergency Drugs available, Suction available, Patient being monitored and Timeout performed Patient Re-evaluated:Patient Re-evaluated prior to induction Oxygen Delivery Method: Simple face mask Placement Confirmation: positive ETCO2 and breath sounds checked- equal and bilateral Dental Injury: Teeth and Oropharynx as per pre-operative assessment

## 2022-09-08 NOTE — NC FL2 (Signed)
[place] MEDICAID FL2 LEVEL OF CARE FORM     IDENTIFICATION  Patient Name: Rhonda Blackwell Birthdate: January 09, 1951 Sex: female Admission Date (Current Location): 08/29/2022  [facility] and [place] Number:  Herbalist and Address:  [facility],  [place], [place]      Provider Number: [id number]  Attending Physician Name and Address:  Elmarie Shiley, MD  Relative Name and Phone Number:  Keali Antonellis (spouse) Ph: [phone]    Current Level of Care: Hospital Recommended Level of Care: [place] Prior Approval Number:    Date Approved/Denied:   PASRR Number: [id number]  Discharge Plan: SNF    Current Diagnoses: Patient Active Problem List   Diagnosis Date Noted   Hypocalcemia 09/05/2022   Hypophosphatemia 09/05/2022   Lung cancer, primary, with metastasis from lung to other site, left ([place]) 09/04/2022   Pleural effusion on left 09/03/2022   Lung mass 09/02/2022   Acute CVA (cerebrovascular accident) ([place]) 09/02/2022   Malignant neoplasm of hilus of left lung ([place]) 08/31/2022   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 ([place]) 08/31/2022   History of CVA (cerebrovascular accident) 08/29/2022   Chronic anxiety 08/29/2022   Tobacco use 08/29/2022   Paroxysmal atrial fibrillation with RVR ([place]) 07/27/2022   Acute on chronic respiratory failure with hypoxia ([place]) 07/24/2022   Dyslipidemia 07/24/2022   Essential hypertension 07/24/2022   Sepsis due to pneumonia ([place]) 07/23/2022   Nausea vomiting and diarrhea 08/02/2015   Dyslipidemia associated with type 2 diabetes mellitus ([place]) 08/02/2015   Type 2 diabetes mellitus without complications ([place]) [id number]   Hyponatremia 02/22/2012   HTN (hypertension) 02/22/2012    Orientation RESPIRATION BLADDER Height & Weight     Self, Time, Situation, Place  O2 (3L/min) Incontinent Weight: 146 lb 9.7 oz (66.5 kg) Height:  5\' 4"  (162.6 cm)  BEHAVIORAL SYMPTOMS/MOOD  NEUROLOGICAL BOWEL NUTRITION STATUS   (N/A)  (N/A) Incontinent Diet (See discharge summary)  AMBULATORY STATUS COMMUNICATION OF NEEDS Skin   Extensive Assist Verbally Other (Comment) (Ecchymosis: bilateral legs)                       Personal Care Assistance Level of Assistance  Bathing, Feeding, Dressing Bathing Assistance: Maximum assistance Feeding assistance: Limited assistance Dressing Assistance: Maximum assistance     Functional Limitations Info  Sight, Hearing, Speech Sight Info: Adequate Hearing Info: Impaired Speech Info: Adequate    SPECIAL CARE FACTORS FREQUENCY  PT (By licensed PT), OT (By licensed OT)     PT Frequency: 5x's/week OT Frequency: 5x's/week            Contractures Contractures Info: Not present    Additional Factors Info  Code Status, Allergies, Insulin Sliding Scale, Psychotropic Code Status Info: Full Allergies Info: Bystolic (Nebivolol Hcl), Hydrochlorothiazide, Norvasc (Amlodipine) Psychotropic Info: Cymbalta Insulin Sliding Scale Info: See discharge summary       Current Medications (09/08/2022):  This is the current hospital active medication list Current Facility-Administered Medications  Medication Dose Route Frequency Provider Last Rate Last Admin   [MAR Hold] 0.9 %  sodium chloride infusion   Intravenous PRN Regalado, Belkys A, MD   Paused at 09/07/22 1514   0.9 %  sodium chloride infusion   Intravenous Continuous Carol Ada, MD       [MAR Hold] acetaminophen (TYLENOL) tablet 650 mg  650 mg Oral Q8H PRN Eudelia Bunch, RPH   650 mg at 09/03/22 2024   [MAR Hold] albuterol (  PROVENTIL) (2.5 MG/3ML) 0.083% nebulizer solution 2.5 mg  2.5 mg Inhalation Q6H PRN Leodis Sias T, RPH   2.5 mg at 09/02/22 2049   [MAR Hold] Ampicillin-Sulbactam (UNASYN) 3 g in sodium chloride 0.9 % 100 mL IVPB  3 g Intravenous Q6H Eugenie Filler, MD 200 mL/hr at 09/08/22 1107 3 g at 09/08/22 1107   [MAR Hold] atorvastatin (LIPITOR) tablet 40 mg   40 mg Oral Daily Leodis Sias T, RPH   40 mg at 09/08/22 0923   [MAR Hold] bisacodyl (DULCOLAX) EC tablet 5 mg  5 mg Oral Daily PRN Raenette Rover, NP   5 mg at 09/06/22 2150   [MAR Hold] Chlorhexidine Gluconate Cloth 2 % PADS 6 each  6 each Topical Daily Eugenie Filler, MD   6 each at 09/08/22 0600   dextrose 5 % in lactated ringers infusion   Intravenous Continuous Regalado, Belkys A, MD 30 mL/hr at 09/08/22 0921 New Bag at 09/08/22 0921   [MAR Hold] diltiazem (CARDIZEM) tablet 60 mg  60 mg Oral Q6H Regalado, Belkys A, MD   60 mg at 09/08/22 0548   [MAR Hold] dronabinol (MARINOL) capsule 2.5 mg  2.5 mg Oral BID AC Volanda Napoleon, MD   2.5 mg at 09/07/22 1602   [MAR Hold] DULoxetine (CYMBALTA) DR capsule 60 mg  60 mg Oral BID Eudelia Bunch, RPH   60 mg at 09/08/22 0923   [MAR Hold] feeding supplement (ENSURE ENLIVE / ENSURE PLUS) liquid 237 mL  237 mL Oral BID BM Regalado, Belkys A, MD   237 mL at 09/07/22 1412   [MAR Hold] fluticasone (FLONASE) 50 MCG/ACT nasal spray 2 spray  2 spray Each Nare Daily Leodis Sias T, RPH   2 spray at 09/08/22 0922   [MAR Hold] furosemide (LASIX) injection 40 mg  40 mg Intravenous Daily Erick Colace, NP   40 mg at 09/08/22 0916   [MAR Hold] guaiFENesin (MUCINEX) 12 hr tablet 600 mg  600 mg Oral BID Regalado, Belkys A, MD   600 mg at 09/08/22 0923   [MAR Hold] guaiFENesin-dextromethorphan (ROBITUSSIN DM) 100-10 MG/5ML syrup 5 mL  5 mL Oral Q4H PRN Pokhrel, Laxman, MD   5 mL at 09/08/22 0923   [MAR Hold] insulin aspart (novoLOG) injection 0-6 Units  0-6 Units Subcutaneous TID WC Eudelia Bunch, RPH   2 Units at 09/07/22 1808   [MAR Hold] irbesartan (AVAPRO) tablet 150 mg  150 mg Oral BID Eudelia Bunch, RPH   150 mg at 09/08/22 0923   [MAR Hold] labetalol (NORMODYNE) tablet 100 mg  100 mg Oral BID Eudelia Bunch, RPH   100 mg at 09/08/22 [id number]   lactated ringers infusion    Continuous PRN Carol Ada, MD   1,000 mL at 09/08/22 1237   [MAR Hold]  magnesium oxide (MAG-OX) tablet 400 mg  400 mg Oral BID Eudelia Bunch, RPH   400 mg at 09/08/22 0923   [MAR Hold] mometasone-formoterol (DULERA) 200-5 MCG/ACT inhaler 2 puff  2 puff Inhalation BID Eudelia Bunch, RPH   2 puff at 09/08/22 0820   [MAR Hold] morphine (PF) 2 MG/ML injection 2 mg  2 mg Intravenous Q3H PRN Leodis Sias T, RPH   2 mg at 09/06/22 1437   [MAR Hold] Oral care mouth rinse  15 mL Mouth Rinse PRN Eugenie Filler, MD       [MAR Hold] pantoprazole (PROTONIX) EC tablet 40 mg  40 mg Oral Daily  Eudelia Bunch, RPH   40 mg at 09/08/22 [id number]     Discharge Medications: Please see discharge summary for a list of discharge medications.  Relevant Imaging Results:  Relevant Lab Results:   Additional Information SSN: [id number]. Patient's final radiation treatment scheduled for 09/13/22. Patient will need weekly chemo on Wednesdays.  Sherie Don, LCSW

## 2022-09-08 NOTE — TOC Progression Note (Signed)
Transition of Care [facility]) - Progression Note   Patient Details  Name: Rhonda Blackwell MRN: [id number] Date of Birth: 1950-10-15  Transition of Care [facility]) CM/SW [place], LCSW Phone Number: 09/08/2022, 1:11 PM  Clinical Narrative: PT and OT evaluations recommended SNF. Patient was in procedure, so CSW spoke with patient's spouse, Rhonda Blackwell, regarding recommendations. Spouse agreeable to SNF and reported patient has gone to SNF before. FL2 done; PASRR verified. Initial referral faxed out. TOC awaiting bed offers.   Expected Discharge Plan: [place] Barriers to Discharge: Continued Medical Work up  Expected Discharge Plan and Services Living arrangements for the past 2 months: Single Family Home           DME Arranged: N/A DME Agency: NA  Social Determinants of Health (SDOH) Interventions SDOH Screenings   Food Insecurity: No Food Insecurity (08/30/2022)  Housing: Low Risk  (08/30/2022)  Transportation Needs: No Transportation Needs (08/30/2022)  Utilities: Not At Risk (08/30/2022)  Tobacco Use: High Risk (09/08/2022)   Readmission Risk Interventions    07/28/2022    2:35 PM  Readmission Risk Prevention Plan  Transportation Screening Complete  PCP or Specialist Appt within 5-7 Days Complete  Home Care Screening Complete  Medication Review (RN CM) Complete

## 2022-09-08 NOTE — Progress Notes (Signed)
Correction, there was misunderstanding.  The FFS will proceed today.

## 2022-09-08 NOTE — Anesthesia Postprocedure Evaluation (Signed)
Anesthesia Post Note  Patient: Rhonda Blackwell  Procedure(s) Performed: [place]     Patient location during evaluation: PACU Anesthesia Type: MAC Level of consciousness: awake and alert and oriented Pain management: pain level controlled Vital Signs Assessment: post-procedure vital signs reviewed and stable Respiratory status: spontaneous breathing, nonlabored ventilation, respiratory function stable and patient connected to nasal cannula oxygen Cardiovascular status: blood pressure returned to baseline and stable Postop Assessment: no apparent nausea or vomiting Anesthetic complications: no   No notable events documented.  Last Vitals:  Vitals:   09/08/22 1341 09/08/22 1345  BP: 129/60   Pulse: 75 82  Resp: 19   Temp:    SpO2: 94% 95%    Last Pain:  Vitals:   09/08/22 1314  TempSrc: Temporal  PainSc:                  [name] A.

## 2022-09-08 NOTE — Transfer of Care (Signed)
Immediate Anesthesia Transfer of Care Note  Patient: Rhonda Blackwell  Procedure(s) Performed: FLEXIBLE SIGMOIDOSCOPY  Patient Location: PACU and Endoscopy Unit  Anesthesia Type:MAC  Level of Consciousness: sedated  Airway & Oxygen Therapy: Patient Spontanous Breathing and Patient connected to face mask oxygen  Post-op Assessment: Report given to RN and Post -op Vital signs reviewed and stable  Post vital signs: Reviewed and stable  Last Vitals:  Vitals Value Taken Time  BP    Temp    Pulse    Resp    SpO2      Last Pain:  Vitals:   09/08/22 1230  TempSrc: Temporal  PainSc: 0-No pain      Patients Stated Pain Goal: 0 ([id number])  Complications: No notable events documented.

## 2022-09-08 NOTE — Op Note (Signed)
[facility] Patient Name: Rhonda Blackwell Procedure Date: 09/08/2022 MRN: [id number] Attending MD: Carol Ada , MD, [id number] Date of Birth: 10-26-1950 CSN: [id number] Age: 72 Admit Type: Inpatient Procedure:                Flexible Sigmoidoscopy Indications:              Abnormal PET scan of the GI tract Providers:                Carol Ada, MD, Fanny Skates RN, RN, William Dalton, Technician Referring MD:             Rudell Cobb. Ennever MD, MD Medicines:                Propofol per Anesthesia Complications:            No immediate complications. Estimated Blood Loss:     Estimated blood loss: none. Procedure:                Pre-Anesthesia Assessment:                           - Prior to the procedure, a History and Physical                            was performed, and patient medications and                            allergies were reviewed. The patient's tolerance of                            previous anesthesia was also reviewed. The risks                            and benefits of the procedure and the sedation                            options and risks were discussed with the patient.                            All questions were answered, and informed consent                            was obtained. Prior Anticoagulants: The patient has                            taken no anticoagulant or antiplatelet agents. ASA                            Grade Assessment: III - A patient with severe                            systemic disease. After reviewing the risks and  benefits, the patient was deemed in satisfactory                            condition to undergo the procedure.                           - Sedation was administered by an anesthesia                            professional. Deep sedation was attained.                           After obtaining informed consent, the scope was                             passed under direct vision. The [id number]                            Olympus endoscope was introduced through the anus                            and advanced to the the sigmoid colon. The flexible                            sigmoidoscopy was accomplished without difficulty.                            The patient tolerated the procedure well. The                            quality of the bowel preparation was excellent. Scope In: 12:58:00 PM Scope Out: 12:59:27 PM Total Procedure Duration: 0 hours 1 minute 27 seconds  Findings:      The rectum, recto-sigmoid colon and sigmoid colon appeared normal. Impression:               - The rectum, sigmoid colon and recto-sigmoid colon                            are normal.                           - No specimens collected. Moderate Sedation:      Not Applicable - Patient had care per Anesthesia. Recommendation:           - Return patient to hospital ward for ongoing care.                           - Regular diet.                           - Restart heparin.                           - Signing off. Procedure Code(s):        --- Professional ---  [id number], Sigmoidoscopy, flexible; diagnostic,                            including collection of specimen(s) by brushing or                            washing, when performed (separate procedure) Diagnosis Code(s):        --- Professional ---                           R93.3, Abnormal findings on diagnostic imaging of                            other parts of digestive tract CPT copyright 2022 [employer]. All rights reserved. The codes documented in this report are preliminary and upon coder review may  be revised to meet current compliance requirements. Carol Ada, MD Carol Ada, MD 09/08/2022 1:03:24 PM This report has been signed electronically. Number of Addenda: 0

## 2022-09-08 NOTE — Progress Notes (Signed)
Physical Therapy Treatment Patient Details Name: Rhonda Blackwell MRN: [id number] DOB: July 16, 1950 Today's Date: 09/08/2022   History of Present Illness 72 y.o. female with medical history significant of COPD, Type 2 diabetes, CVA, hypertension atrial fibrillation on Eliquis, metastatic disease with unknown primary presented to the hospital with increasing shortness of breath.  Of note, patient was recently admitted between  07/23/22 to 07/27/22 with sepsis secondary to community-acquired multifocal pneumonia.  She was discharged on 2 L nasal cannula at rest and up to 3 L nasal cannula on ambulation. Dx of pleural effusion, hypokalemia, Small acute infarct left frontal white matter.    PT Comments    The patient is motivated to  mobilize today.  Patient requires mod assistance  for sitting  onto bed edge and transfer pivot to [facility], Unable to  ambulate. Patient stood with support of bed rail,Recliner brought up for patient to sit in as tolerated. Continue PT for progressive mobility.   Recommendations for follow up therapy are one component of a multi-disciplinary discharge planning process, led by the attending physician.  Recommendations may be updated based on patient status, additional functional criteria and insurance authorization.  Follow Up Recommendations  Skilled nursing-short term rehab (<3 hours/day) Can patient physically be transported by private vehicle: No   Assistance Recommended at Discharge Frequent or constant Supervision/Assistance  Patient can return home with the following Two people to help with walking and/or transfers;A lot of help with bathing/dressing/bathroom;Assistance with cooking/housework;Assist for transportation;Help with stairs or ramp for entrance   Equipment Recommendations  None recommended by PT    Recommendations for Other Services       Precautions / Restrictions Precautions Precautions: Fall Precaution Comments: watch O2 and HR Restrictions Weight  Bearing Restrictions: No     Mobility  Bed Mobility                    Transfers                   General transfer comment: assist to power up, steadying assist to pivot, BLEs weak with mild buckling, HR 80 max, SpO2 92% on 7 L with activity    Ambulation/Gait                   Stairs             Wheelchair Mobility    Modified Rankin (Stroke Patients Only)       Balance                                            Cognition                                                Exercises      General Comments        Pertinent Vitals/Pain Pain Assessment Faces Pain Scale: Hurts little more Pain Location: Back Pain Descriptors / Indicators: Tender Pain Intervention(s): Monitored during session    Home Living Family/patient expects to be discharged to:: Private residence Living Arrangements: Spouse/significant other Available Help at Discharge: Family;Available 24 hours/day Type of Home: House Home Access: Stairs to enter Entrance Stairs-Rails: None Entrance Stairs-Number of Steps: 1   Home Layout: One level Home  Equipment: Advice worker (2 wheels);Cane - single point;Rollator (4 wheels);Wheelchair - manual      Prior Function            PT Goals (current goals can now be found in the care plan section) Progress towards PT goals: Progressing toward goals    Frequency    Min 2X/week      PT Plan Current plan remains appropriate    Co-evaluation PT/OT/SLP Co-Evaluation/Treatment: Yes Reason for Co-Treatment: Complexity of the patient's impairments (multi-system involvement);For patient/therapist safety PT goals addressed during session: Mobility/safety with mobility OT goals addressed during session: ADL's and self-care      AM-PAC PT "6 Clicks" Mobility   Outcome Measure  Help needed turning from your back to your side while in a flat bed without using bedrails?:  A Little Help needed moving from lying on your back to sitting on the side of a flat bed without using bedrails?: A Lot Help needed moving to and from a bed to a chair (including a wheelchair)?: A Lot Help needed standing up from a chair using your arms (e.g., wheelchair or bedside chair)?: A Lot Help needed to walk in hospital room?: Total   6 Click Score: 10    End of Session Equipment Utilized During Treatment: Oxygen Activity Tolerance: Patient tolerated treatment well Patient left: in chair;with chair alarm set;with call bell/phone within reach;with nursing/sitter in room Nurse Communication: Mobility status PT Visit Diagnosis: Difficulty in walking, not elsewhere classified (R26.2)     Time: [id number] PT Time Calculation (min) (ACUTE ONLY): 23 min  Charges:  $Therapeutic Activity: 8-22 mins                     [place] Office [phone] Weekend pager-[phone]    Claretha Cooper 09/08/2022, 10:01 AM

## 2022-09-08 NOTE — Progress Notes (Addendum)
ANTICOAGULATION CONSULT NOTE - Follow Up Consult  Pharmacy Consult for Heparin Indication: atrial fibrillation (apixaban on hold)  Allergies  Allergen Reactions   Bystolic [Nebivolol Hcl] Nausea Only and Other (See Comments)    Abdominal pain   Hydrochlorothiazide Nausea Only and Swelling    Leg swelling   Norvasc [Amlodipine] Swelling    Leg swelling    Patient Measurements: Height: 5\' 4"  (162.6 cm) Weight: 66.5 kg (146 lb 9.7 oz) IBW/kg (Calculated) : 54.7 Heparin Dosing Weight: 65 kg  Vital Signs: Temp: 98 F (36.7 C) (03/22 0340) Temp Source: Oral (03/22 0340) BP: 155/69 (03/22 0548) Pulse Rate: 75 (03/22 0548)  Labs: Recent Labs    09/06/22 0650 09/06/22 0910 09/07/22 0300 09/07/22 0929 09/07/22 1030 09/07/22 1814 09/08/22 0347  HGB 9.2*  --  9.5*  --   --   --  9.8*  HCT 29.3*  --  29.9*  --   --   --  31.3*  PLT 380  --  407*  --   --   --  434*  HEPARINUNFRC  --    < > 0.37  --  0.30 0.28*  --   CREATININE 0.79  --   --  0.70  --   --  0.74   < > = values in this interval not displayed.     Estimated Creatinine Clearance: 60.5 mL/min (by C-G formula based on SCr of 0.74 mg/dL).   Medications: Eliquis PTA  Assessment: 68 yoF admitted on 3/12 with history of afib on Eliquis.  She had run out of Eliquis  at home for about a week, but was continued inpatient through 3/15 at 21:00, then held in anticipation of procedures. 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  GI was planning flexible sigmoidoscopy 3/17 to investigate abnormal PET scan findings, but was delayed d/t clinical instability.  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  Pharmacy consulted for heparin management by Neurology for atrial fibrillation - neuro scale.   Significant Events: -3/17: heparin drip stopped at 0400 for planned flex sig; procedure cancelled due to change in cardiac and respiratory status. Heparin drip restarted at  0930 -3/18 Heparin held briefly while in IR for [facility] placement -3/22: Heparin stopped @ 0335 for planned flex sig. Pt subsequently refused procedure   Today, 09/08/2022:  CBC: Hgb low but stable, Plt elevated SCr WNL  Heparin currently not infusing - paused in anticipation for flex sig this morning. Pt has refused procedure. Discussed with TRH and GI - will resume heparin at this time.   Goal of Therapy:  Heparin Level 0.3-0.5 Monitor platelets by anticoagulation protocol: Yes   Plan:  Resume heparin infusion at previous rate of 1150 units/hr Check 8 hour heparin level CBC, heparin level daily Monitor for signs of bleeding Follow for ability to transition back to apixaban if no further interventions planned  Lenis Noon, PharmD 09/08/22 7:26 AM  Addendum: There was a misunderstanding - pt will proceed with flex sig this morning. Heparin has not yet been resumed, off since 0335 this morning. Discussed with GI. Will await instructions on when to resume anticoagulation post-procedure.   Lenis Noon, PharmD 09/08/22 7:44 AM

## 2022-09-08 NOTE — H&P (View-Only) (Signed)
Correction, there was misunderstanding.  The FFS will proceed today.

## 2022-09-08 NOTE — Anesthesia Preprocedure Evaluation (Addendum)
Anesthesia Evaluation  Patient identified by MRN, date of birth, ID band Patient awake    Reviewed: Allergy & Precautions, NPO status , Patient's Chart, lab work & pertinent test results  Airway Mallampati: II  TM Distance: >3 FB     Dental  (+) Edentulous Upper, Edentulous Lower   Pulmonary pneumonia, resolved, COPD,  COPD inhaler, Current Smoker and Patient abstained from smoking.   breath sounds clear to auscultation + decreased breath sounds      Cardiovascular hypertension, + dysrhythmias Atrial Fibrillation  Rhythm:Irregular Rate:Normal     Neuro/Psych   Anxiety     CVA    GI/Hepatic Neg liver ROS,,,Abnormal PET scan   Endo/Other  diabetes    Renal/GU   negative genitourinary   Musculoskeletal   Abdominal  (+) + obese  Peds  Hematology Eliquis therapy- last dose ? Heparin gtt currently off    Anesthesia Other Findings   Reproductive/Obstetrics                              Anesthesia Physical Anesthesia Plan  ASA: 3  Anesthesia Plan: MAC   Post-op Pain Management: Minimal or no pain anticipated   Induction:   PONV Risk Score and Plan: 1  Airway Management Planned: Natural Airway, Simple Face Mask and Nasal Cannula  Additional Equipment: None  Intra-op Plan:   Post-operative Plan:   Informed Consent: I have reviewed the patients History and Physical, chart, labs and discussed the procedure including the risks, benefits and alternatives for the proposed anesthesia with the patient or authorized representative who has indicated his/her understanding and acceptance.       Plan Discussed with: CRNA and Anesthesiologist  Anesthesia Plan Comments:         Anesthesia Quick Evaluation

## 2022-09-08 NOTE — Progress Notes (Signed)
PROGRESS NOTE    Rhonda Blackwell  [id number] DOB: 09/10/1950 DOA: 08/29/2022 PCP: [name], MD   Brief Narrative: 72 year old with past medical history significant for COPD, diabetes type 2, CVA, hypertension, A-fib on Eliquis, metastatic disease initially with unknown primary presented to hospital with worsening shortness of breath.  Of note recent admission from 07/23/2022 1 to 07/27/2022 with sepsis secondary to pneumonia multifocal.  Discharged on 2 L of oxygen.  She was also recently diagnosed with A-fib.  Since her diagnosis she has been having shortness of breath and not feeling well.  She has a history of hypermetabolic left upper lobe nodule as well as hypermetabolic hilar and mediastinal lymph nodes, left scapular lesion, area of right acetabulum and rectum concerning for metastatic disease on PET scan.  He was followed by [facility] pulmonology/oncology, underwent biopsy of left scapular lesion 06/08/2022 and bronchoscopy EBUS on 07/04/2022, she did not follow-up with oncology and multiple appointments.  She presented to ED and was found to be A-fib RVR.  Developed worsening hypoxia requiring up to 8 L of high flow oxygen.  CT chest showed masslike consolidation in the left upper lobe abutting the left hilar concerning for central obstructing neoplasm. peripheral areas of consolidation and interlobar septal thickening throughout the left upper lobe concerning for combination of postobstructive changes, regional metastatic disease and lymphangitic spread of tumor. Mediastinal and hilar adenopathy consistent with nodule metastasis. 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   Patient underwent thoracentesis and subsequently tube placement left pleural space on 3/19.  She is undergoing radiation therapy and has been a started on chemotherapy first dose 3/20  Assessment & Plan:   Principal Problem:   Acute on chronic respiratory  failure with hypoxia (HCC) Active Problems:   Type 2 diabetes mellitus without complications (HCC)   Essential hypertension   Hyponatremia   Paroxysmal atrial fibrillation with RVR (HCC)   History of CVA (cerebrovascular accident)   Chronic anxiety   Tobacco use   Malignant neoplasm of hilus of left lung (HCC)   Lung mass   Acute CVA (cerebrovascular accident) ([place])   Pleural effusion on left   Lung cancer, primary, with metastasis from lung to other site, left (HCC)   Hypocalcemia   Hypophosphatemia  1-Acute on Chronic Hypoxic Respiratory failure secondary to Left-sided Pleural Effusion, Lung mass: Flash Pulmonary edema, lung reexpansion.  -Acute on chronic respiratory failure likely multifactorial secondary to lung mass, postobstructive pneumonia, large left pleural effusion. -Patient oxygen requirement increased up to 15 L.  CCM consulted and underwent thoracentesis 3/17 yielding 800 cc of fluids. -CT chest showed masslike consolidation left upper lobe abutting left hilar concerning for central obstructing neoplasm. -On IV antibiotics to cover for postobstructive pneumonia. -Oncology following and concern is for adenocarcinoma of the lung. -Chest x-ray showed reaccumulation of pleural fluid.  Underwent left side pigtail catheter placement by CCM on 3/19. Underwent Chemical Pleurodesis via chest tube 3/20. -Continue with IV lasix.  -Chest tube removed 3/21. Plan to repeat chest x ray tomorrow.  Stable to 3 L oxygen.   2-Metastatic cancer suspect primary Lung. Lung Mass. Left Pleural; effusion.  -Bone scan: Multifocal skeletal metastases involving the spine, ribs, left scapula and minimal involvement of the left hemipelvis. -CT head, neck: Multiple calvarial metastasis, largest located in the right parietal bone with transcortical extension. -Patient evaluated by radiation oncology, patient was a started on radiation treatments.  She will need to complete 2-week course of  treatment.   -Dr. Marin Olp following. -Port cath placement 3/18 by IR -Started  chemotherapy 3/20 -Pleural fluid cytology: Malignant cell presents, poorly differentiated carcinoma. Likely adenocarcinoma.   3-Hypokalemia; Replaced.   4-Small acute infarct left frontal white matter, prior recent history of CVA -MRI done on 09/02/2022 for staging show a small acute infarct in the left frontal white matter.  Chronic small vessel ischemia with multiple chronic lacunar infarct.  Multiple calvarial metastasis. -CT angio head and neck no large vessel occlusion. -Had a recent 2D echo.  This has not been repeated. -LDL: 51.  A1c 6.3. -Continue with the statins.  Currently on heparin drip.  Transition to Eliquis or Lovenox when no further procedures are planned.  5-A-fib RVR: On heparin drip. Intermittent episode of A-fib RVR.  Intermittently on Cardizem drip. On oral cardizem. Labetalol.  Change Cardizem to 60 mg Q 6 hours on 3/21 for elevated HR Stable today.   6-Hypomagnesemia, hypophosphatemia and hypocalcemia Replaced  Hypertension: On Cardizem, Avapro, labetalol.   Tobacco use: Cessation encouraged  Diabetes type 2: A1c 6.3 Sliding scale insulin  Chronic anxiety: Continue with Cymbalta  Hypermetabolic activity in the rectum seen on PET scan: CEA elevated 1400 for sigmoidoscopy today   Hyponatremia: Continue with IV Lasix     Estimated body mass index is 25.16 kg/m as calculated from the following:   Height as of this encounter: 5\' 4"  (1.626 m).   Weight as of this encounter: 66.5 kg.   DVT prophylaxis: Heparin  Code Status: Full code Family Communication: care discussed with patient. Niece at bedside.  Disposition Plan:  Status is: Inpatient Remains inpatient appropriate because: management of metastatic cancer, malignancy     Consultants:  Radiation oncology Dr Marin Olp CCM Dr Benson Norway , GI Neurology   Procedures:  CT chest 08/29/2022 Chest x-ray 08/29/2022, 09/03/2022 MRI  brain 09/02/2022 Bone scan 09/01/2022 Thoracentesis per PCCM, Dr.Olalere 09/03/2022 Port-A-Cath placement 09/04/2022  Antimicrobials:  IV Unasyn   Subjective: She is sleepy, wake up to voice. She feels well, breathing better.    Objective: Vitals:   09/08/22 0000 09/08/22 0340 09/08/22 0400 09/08/22 0548  BP: 120/62  (!) 142/67 (!) 155/69  Pulse: 71  73 75  Resp: (!) 21  (!) 21 20  Temp: 98.1 F (36.7 C) 98 F (36.7 C)    TempSrc: Oral Oral    SpO2: 95%  94% 93%  Weight:      Height:        Intake/Output Summary (Last 24 hours) at 09/08/2022 0820 Last data filed at 09/08/2022 [id number] Gross per 24 hour  Intake 898.4 ml  Output 1400 ml  Net -501.6 ml    Filed Weights   09/02/22 0526 09/03/22 1900 09/04/22 0600  Weight: 65.4 kg 65.4 kg 66.5 kg    Examination:  General exam: NAD, weak  Respiratory system: Normal respiratory effort, BL air movement  Cardiovascular system: S 1, S 2 RRR Gastrointestinal system: BS present, soft, nt Central nervous system: Alert  Extremities: No edema    Data Reviewed: I have personally reviewed following labs and imaging studies  CBC: Recent Labs  Lab 09/03/22 0351 09/04/22 0614 09/05/22 0313 09/06/22 0650 09/07/22 0300 09/08/22 0347  WBC 11.0* 12.2* 9.4 7.8 7.8 9.6  NEUTROABS 9.4* 10.8*  --   --   --   --   HGB 11.1* 10.1* 9.3* 9.2* 9.5* 9.8*  HCT 35.3* 31.2* 29.5* 29.3* 29.9* 31.3*  MCV 77.6* 77.2* 79.9* 80.3 79.9* 78.8*  PLT 456* 408* 383  380 407* 434*    Basic Metabolic Panel: Recent Labs  Lab 09/03/22 0351 09/04/22 0614 09/05/22 0313 09/05/22 0839 09/06/22 0650 09/07/22 0929 09/08/22 0347  NA 132* 126* 127*  --  132* 135 135  K 3.6 3.5 3.1*  --  4.2 3.5 4.0  CL 94* 93* 98  --  103 104 104  CO2 26 25 23   --  21* 23 21*  GLUCOSE 177* 131* 135*  --  131* 131* 131*  BUN 13 14 18   --  13 12 13   CREATININE 0.79 0.75 0.81  --  0.79 0.70 0.74  CALCIUM 8.8* 7.5* 6.9* 7.0* 7.2* 7.1* 7.0*  MG 2.0 2.0 2.3  --  2.2 2.2   --   PHOS  --  3.2 2.2*  --  2.6  --   --     GFR: Estimated Creatinine Clearance: 60.5 mL/min (by C-G formula based on SCr of 0.74 mg/dL). Liver Function Tests: Recent Labs  Lab 09/02/22 0524 09/04/22 0614 09/05/22 0313 09/06/22 0650  AST 13* 19 18 15   ALT 13 17 14 14   ALKPHOS 95 103 97 88  BILITOT 1.2 0.8 0.7 0.5  PROT 6.9 6.3* 5.9* 6.2*  ALBUMIN 3.0* 2.7* 2.5* 2.4*    No results for input(s): "LIPASE", "AMYLASE" in the last 168 hours. No results for input(s): "AMMONIA" in the last 168 hours. Coagulation Profile: No results for input(s): "INR", "PROTIME" in the last 168 hours. Cardiac Enzymes: No results for input(s): "CKTOTAL", "CKMB", "CKMBINDEX", "TROPONINI" in the last 168 hours. BNP (last 3 results) No results for input(s): "PROBNP" in the last 8760 hours. HbA1C: No results for input(s): "HGBA1C" in the last 72 hours. CBG: Recent Labs  Lab 09/06/22 2125 09/07/22 0831 09/07/22 1221 09/07/22 1640 09/07/22 2207  GLUCAP 205* 126* 188* 227* 137*    Lipid Profile: No results for input(s): "CHOL", "HDL", "LDLCALC", "TRIG", "CHOLHDL", "LDLDIRECT" in the last 72 hours. Thyroid Function Tests: Recent Labs    09/06/22 0910  TSH 1.644    Anemia Panel: No results for input(s): "VITAMINB12", "FOLATE", "FERRITIN", "TIBC", "IRON", "RETICCTPCT" in the last 72 hours. Sepsis Labs: Recent Labs  Lab 09/03/22 0351  LATICACIDVEN 1.3     Recent Results (from the past 240 hour(s))  Resp panel by RT-PCR (RSV, Flu A&B, Covid) Anterior Nasal Swab     Status: None   Collection Time: 08/29/22  2:07 PM   Specimen: Anterior Nasal Swab  Result Value Ref Range Status   SARS Coronavirus 2 by RT PCR NEGATIVE NEGATIVE Final   Influenza A by PCR NEGATIVE NEGATIVE Final   Influenza B by PCR NEGATIVE NEGATIVE Final    Comment: (NOTE) The Xpert Xpress SARS-CoV-2/FLU/RSV plus assay is intended as an aid in the diagnosis of influenza from Nasopharyngeal swab specimens and should  not be used as a sole basis for treatment. Nasal washings and aspirates are unacceptable for Xpert Xpress SARS-CoV-2/FLU/RSV testing.  Fact Sheet for Patients: EntrepreneurPulse.com.au  Fact Sheet for Healthcare Providers: IncredibleEmployment.be  This test is not yet approved or cleared by the [place] FDA and has been authorized for detection and/or diagnosis of SARS-CoV-2 by FDA under an Emergency Use Authorization (EUA). This EUA will remain in effect (meaning this test can be used) for the duration of the COVID-19 declaration under Section 564(b)(1) of the Act, 21 U.S.C. section 360bbb-3(b)(1), unless the authorization is terminated or revoked.     Resp Syncytial Virus by PCR NEGATIVE NEGATIVE Final    Comment: (NOTE)  Fact Sheet for Patients: EntrepreneurPulse.com.au  Fact Sheet for Healthcare Providers: IncredibleEmployment.be  This test is not yet approved or cleared by the [place] FDA and has been authorized for detection and/or diagnosis of SARS-CoV-2 by FDA under an Emergency Use Authorization (EUA). This EUA will remain in effect (meaning this test can be used) for the duration of the COVID-19 declaration under Section 564(b)(1) of the Act, 21 U.S.C. section 360bbb-3(b)(1), unless the authorization is terminated or revoked.  Performed at [facility], [place] [address], [place], [place] [address]   Expectorated Sputum Assessment w Gram Stain, Rflx to Resp Cult     Status: None   Collection Time: 08/29/22  9:22 PM   Specimen: Expectorated Sputum  Result Value Ref Range Status   Specimen Description EXPECTORATED SPUTUM  Final   Special Requests NONE  Final   Sputum evaluation   Final    THIS SPECIMEN IS ACCEPTABLE FOR SPUTUM CULTURE Performed at [facility], [place] [address], [place], [place] [address]    Report Status 09/05/2022 FINAL  Final  Culture, Respiratory w Gram  Stain     Status: None   Collection Time: 08/29/22  9:22 PM  Result Value Ref Range Status   Specimen Description EXPECTORATED SPUTUM  Final   Special Requests NONE Reflexed from [id number]  Final   Gram Stain   Final    RARE WBC PRESENT, PREDOMINANTLY MONONUCLEAR NO ORGANISMS SEEN    Culture   Final    RARE Normal respiratory flora-no Staph aureus or Pseudomonas seen Performed at [facility], [place] [address] [place], [place] [address]    Report Status 09/03/2022 FINAL  Final  Body fluid culture w Gram Stain     Status: None   Collection Time: 09/03/22  3:03 PM   Specimen: Pleural Fluid  Result Value Ref Range Status   Specimen Description   Final    PLEURAL Performed at [facility] [address] [place], [place] [address]    Special Requests   Final    NONE Performed at [facility], [place] [address] [place], [place] [address]    Gram Stain   Final    RARE WBC PRESENT, PREDOMINANTLY PMN NO ORGANISMS SEEN    Culture   Final    NO GROWTH 3 DAYS Performed at [facility] [address] [place], [place] [address]    Report Status 09/06/2022 FINAL  Final  MRSA Next Gen by PCR, Nasal     Status: None   Collection Time: 09/03/22  7:39 PM   Specimen: Nasal Mucosa; Nasal Swab  Result Value Ref Range Status   MRSA by PCR Next Gen NOT DETECTED NOT DETECTED Final    Comment: (NOTE) The GeneXpert MRSA Assay (FDA approved for NASAL specimens only), is one component of a comprehensive MRSA colonization surveillance program. It is not intended to diagnose MRSA infection nor to guide or monitor treatment for MRSA infections. Test performance is not FDA approved in patients less than 43 years old. Performed at [facility], [place] [address], [place], [place] [address]          Radiology Studies: [facility] Chest Port 1 View  Result Date: 09/07/2022 CLINICAL DATA:  [id number] with pneumonia, left chest tube. EXAM:  PORTABLE CHEST 1 VIEW COMPARISON:  Portable chest yesterday at 4:47 a.m. FINDINGS: 4:39 a.m. There is stable cardiomegaly with mild vascular prominence and interstitial edema. Left upper lobe perihilar  opacity extending to the periphery is again noted consistent with pneumonia or mass. Left lower lobe consolidation appears similar with left pleural effusion and unchanged positioning of pigtail left chest tube. There is no visible pneumothorax. No consolidation is seen on the right. Right IJ port catheter positioning is stable in the distal SVC. Stable mediastinum with calcification and tortuosity of aorta. Overall aeration seems unchanged.  No new abnormality. IMPRESSION: No significant change in the appearance of the chest. Left upper lobe perihilar opacity extending to the periphery is again noted consistent with pneumonia or mass. Left lower lobe consolidation and left pleural effusion appear similar. Cardiomegaly with mild interstitial edema. Electronically Signed   By: Telford Nab M.D.   On: 09/07/2022 06:41   IR IMAGING GUIDED PORT INSERTION  Result Date: 09/04/2022 INDICATION: 72 year old with metastatic lung cancer. Port-A-Cath needed to assist with patient's treatment. EXAM: FLUOROSCOPIC AND ULTRASOUND GUIDED PLACEMENT OF A SUBCUTANEOUS PORT COMPARISON:  None Available. MEDICATIONS: Moderate sedation ANESTHESIA/SEDATION: Moderate (conscious) sedation was employed during this procedure. A total of Versed 0.5 mg and fentanyl 25 mcg was administered intravenously at the order of the provider performing the procedure. Total intra-service moderate sedation time: 24 minutes. Patient's level of consciousness and vital signs were monitored continuously by radiology nurse throughout the procedure under the supervision of the provider performing the procedure. FLUOROSCOPY TIME:  Radiation Exposure Index (as provided by the fluoroscopic device): 1 mGy Kerma COMPLICATIONS: None immediate. PROCEDURE: The  procedure, risks, benefits, and alternatives were explained to the patient/family. Questions regarding the procedure were encouraged and answered. Informed consent was obtained for the procedure. Patient was placed supine on the interventional table. Ultrasound confirmed a patent right internal jugular vein. Ultrasound image was saved for documentation. The right chest and neck were cleaned with a skin antiseptic and a sterile drape was placed. Maximal barrier sterile technique was utilized including caps, mask, sterile gowns, sterile gloves, sterile drape, hand hygiene and skin antiseptic. The right neck was anesthetized with 1% lidocaine. Small incision was made in the right neck with a blade. Micropuncture set was placed in the right internal jugular vein with ultrasound guidance. The micropuncture wire was used for measurement purposes. The right chest was anesthetized with 1% lidocaine with epinephrine. #15 blade was used to make an incision and a subcutaneous port pocket was formed. [place] was assembled. Subcutaneous tunnel was formed with a stiff tunneling device. The port catheter was brought through the subcutaneous tunnel. The port was placed in the subcutaneous pocket. The micropuncture set was exchanged for a peel-away sheath. The catheter was placed through the peel-away sheath and the tip was positioned at the superior cavoatrial junction. Catheter placement was confirmed with fluoroscopy. The port was accessed and flushed with saline. The port pocket was closed using two layers of absorbable sutures and Dermabond. The vein skin site was closed using a single layer of absorbable suture and Dermabond. Sterile dressings were applied. Patient tolerated the procedure well without an immediate complication. Ultrasound and fluoroscopic images were taken and saved for this procedure. IMPRESSION: Placement of a subcutaneous power-injectable port device. Catheter tip at the superior cavoatrial  junction. Electronically Signed   By: Markus Daft M.D.   On: 09/04/2022 17:45        Scheduled Meds:  atorvastatin  40 mg Oral Daily   Chlorhexidine Gluconate Cloth  6 each Topical Daily   diltiazem  60 mg Oral Q6H   dronabinol  2.5 mg Oral BID AC  DULoxetine  60 mg Oral BID   feeding supplement  237 mL Oral BID BM   fluticasone  2 spray Each Nare Daily   furosemide  40 mg Intravenous Daily   guaiFENesin  600 mg Oral BID   insulin aspart  0-6 Units Subcutaneous TID WC   irbesartan  150 mg Oral BID   labetalol  100 mg Oral BID   magnesium oxide  400 mg Oral BID   mometasone-formoterol  2 puff Inhalation BID   pantoprazole  40 mg Oral Daily   Continuous Infusions:  sodium chloride Stopped (09/07/22 1514)   ampicillin-sulbactam (UNASYN) IV Stopped (09/08/22 0627)   dextrose 5% lactated ringers       LOS: 10 days    Time spent: 35 minutes    Viyan Rosamond A Fletcher Rathbun, MD [facility]   If 7PM-7AM, please contact night-coverage [email or web address]  09/08/2022, 8:20 AM

## 2022-09-08 NOTE — Evaluation (Signed)
Occupational Therapy Evaluation Patient Details Name: Rhonda Blackwell MRN: [id number] DOB: 02-Aug-1950 Today's Date: 09/08/2022   History of Present Illness 72 y.o. female with medical history significant of COPD, Type 2 diabetes, CVA, hypertension atrial fibrillation on Eliquis, metastatic disease with unknown primary presented to the hospital with increasing shortness of breath.  Of note, patient was recently admitted between  07/23/22 to 07/27/22 with sepsis secondary to community-acquired multifocal pneumonia.  She was discharged on 2 L nasal cannula at rest and up to 3 L nasal cannula on ambulation. Dx of pleural effusion, hypokalemia, Small acute infarct left frontal white matter.   Clinical Impression   Patient admitted for the diagnosis above.  PTA she lives with her spouse, and has assist from her daughter, who works during the day.  Patient stating she walks at times with a 2WRW, but not always, and has assist with ADL care from her daughter.  Patient stating increasing assist with ADL as of late.  Currently she is Mod A for basic transfers and is needing up to Max A for lower body ADL from a sit to stand level.  OT is indicated in the acute setting to address deficits listed.  The patient is hoping to return home, but unless the family can provide the needed 24 hour up to Max A needed to transition home, SNF is recommended for post acute rehab to maximize her functional independence.        Recommendations for follow up therapy are one component of a multi-disciplinary discharge planning process, led by the attending physician.  Recommendations may be updated based on patient status, additional functional criteria and insurance authorization.   Follow Up Recommendations  Skilled nursing-short term rehab (<3 hours/day)     Assistance Recommended at Discharge Frequent or constant Supervision/Assistance  Patient can return home with the following Help with stairs or ramp for entrance;A lot of help  with walking and/or transfers;A lot of help with bathing/dressing/bathroom;Assist for transportation;Assistance with cooking/housework    Functional Status Assessment  Patient has had a recent decline in their functional status and demonstrates the ability to make significant improvements in function in a reasonable and predictable amount of time.  Equipment Recommendations  BSC/3in1    Recommendations for Other Services       Precautions / Restrictions Precautions Precautions: Fall Precaution Comments: watch O2 and HR Restrictions Weight Bearing Restrictions: No      Mobility Bed Mobility Overal bed mobility: Needs Assistance Bed Mobility: Sidelying to Sit   Sidelying to sit: Mod assist         Patient Response: Cooperative  Transfers Overall transfer level: Needs assistance Equipment used: Rolling walker (2 wheels) Transfers: Sit to/from Stand, Bed to chair/wheelchair/BSC Sit to Stand: Mod assist Stand pivot transfers: Mod assist                Balance Overall balance assessment: Needs assistance Sitting-balance support: Feet supported, Single extremity supported Sitting balance-Leahy Scale: Good     Standing balance support: Reliant on assistive device for balance Standing balance-Leahy Scale: Poor                             ADL either performed or assessed with clinical judgement   ADL       Grooming: Wash/dry hands;Wash/dry face;Supervision/safety;Sitting           Upper Body Dressing : Moderate assistance;Sitting   Lower Body Dressing: Sit to/from stand;Maximal assistance  Toilet Transfer: Moderate assistance;Stand-pivot;BSC/3in1                   Vision Patient Visual Report: No change from baseline       Perception     Praxis      Pertinent Vitals/Pain Pain Assessment Pain Assessment: Faces Faces Pain Scale: Hurts little more Pain Location: Back Pain Descriptors / Indicators: Tender Pain  Intervention(s): Monitored during session     Hand Dominance Right   Extremity/Trunk Assessment Upper Extremity Assessment Upper Extremity Assessment: Generalized weakness   Lower Extremity Assessment Lower Extremity Assessment: Defer to PT evaluation   Cervical / Trunk Assessment Cervical / Trunk Assessment: Normal   Communication Communication Communication: No difficulties   Cognition Arousal/Alertness: Awake/alert Behavior During Therapy: Flat affect Overall Cognitive Status: Within Functional Limits for tasks assessed                                       General Comments   O2 did drop to 83% briefly, and HR ranged in the 80's and 90's during the treatment.      Exercises     Shoulder Instructions      Home Living Family/patient expects to be discharged to:: Private residence Living Arrangements: Spouse/significant other Available Help at Discharge: Family;Available 24 hours/day Type of Home: House Home Access: Stairs to enter [employer] of Steps: 1 Entrance Stairs-Rails: None Home Layout: One level     Bathroom Shower/Tub: Corporate investment banker: Standard Bathroom Accessibility: Yes How Accessible: Accessible via walker Home Equipment: [employer] [place] (2 wheels);Cane - single point;Rollator (4 wheels);Wheelchair - manual          Prior Functioning/Environment Prior Level of Function : Needs assist               ADLs Comments: Reports husband assisted with dressing occasionally, mostly socks and coat. Able to get in/out of bath tub, uses shower chair.  Daughter also assisted with ADL        OT Problem List: Decreased strength;Decreased activity tolerance;Impaired balance (sitting and/or standing);Pain      OT Treatment/Interventions: Self-care/ADL training;Therapeutic exercise;Therapeutic activities;Patient/family education;DME and/or AE instruction;Balance training    OT  Goals(Current goals can be found in the care plan section) Acute Rehab OT Goals Patient Stated Goal: Return home OT Goal Formulation: With patient Time For Goal Achievement: 09/22/22 Potential to Achieve Goals: Fair ADL Goals Pt Will Perform Grooming: with set-up;sitting Pt Will Perform Upper Body Bathing: with set-up;sitting Pt Will Perform Lower Body Bathing: with min assist;sit to/from stand Pt Will Perform Upper Body Dressing: with set-up;sitting Pt Will Perform Lower Body Dressing: with min assist;sit to/from stand Pt Will Transfer to Toilet: with supervision;ambulating;regular height toilet Pt/caregiver will Perform Home Exercise Program: Increased strength;Both right and left upper extremity;With Supervision  OT Frequency: Min 2X/week    Co-evaluation PT/OT/SLP Co-Evaluation/Treatment: Yes Reason for Co-Treatment: Complexity of the patient's impairments (multi-system involvement);For patient/therapist safety   OT goals addressed during session: ADL's and self-care      AM-PAC OT "6 Clicks" Daily Activity     Outcome Measure Help from another person eating meals?: None Help from another person taking care of personal grooming?: A Little Help from another person toileting, which includes using toliet, bedpan, or urinal?: A Lot Help from another person bathing (including washing, rinsing, drying)?: A Lot Help from another person to put on and taking  off regular upper body clothing?: A Lot Help from another person to put on and taking off regular lower body clothing?: A Lot 6 Click Score: 15   End of Session Equipment Utilized During Treatment: Rolling walker (2 wheels) Nurse Communication: Mobility status  Activity Tolerance: Patient tolerated treatment well Patient left: in chair;with call bell/phone within reach;with chair alarm set;with family/visitor present  OT Visit Diagnosis: Unsteadiness on feet (R26.81);Muscle weakness (generalized) (M62.81)                Time:  [id number] OT Time Calculation (min): 23 min Charges:  OT General Charges $OT Visit: 1 Visit OT Evaluation $OT Eval Moderate Complexity: 1 Mod  09/08/2022  RP, OTR/L  Acute Rehabilitation Services  Office:  [phone]   Metta Clines 09/08/2022, 8:55 AM

## 2022-09-08 NOTE — Progress Notes (Signed)
My endoscopy staff informed me that she is refusing the [facility] and she wants a different physician.  This is rather puzzling as she was agreeable yesterday and it was a pleasant interaction.  Given the state of her disease, ie, metastatic disease it is unlikely a flexible sigmoidoscopy will change her clinical outcome.  Signing off.

## 2022-09-08 NOTE — Interval H&P Note (Signed)
History and Physical Interval Note:  09/08/2022 12:37 PM  Rhonda Blackwell  has presented today for surgery, with the diagnosis of Abnormal PET scan.  The various methods of treatment have been discussed with the patient and family. After consideration of risks, benefits and other options for treatment, the patient has consented to  Procedure(s): FLEXIBLE SIGMOIDOSCOPY (N/A) as a surgical intervention.  The patient's history has been reviewed, patient examined, no change in status, stable for surgery.  I have reviewed the patient's chart and labs.  Questions were answered to the patient's satisfaction.     Johnathan Tortorelli D

## 2022-09-09 ENCOUNTER — Other Ambulatory Visit: Payer: Self-pay

## 2022-09-09 ENCOUNTER — Inpatient Hospital Stay (HOSPITAL_COMMUNITY): Payer: Medicare Other

## 2022-09-09 DIAGNOSIS — R590 Localized enlarged lymph nodes: Secondary | ICD-10-CM | POA: Diagnosis not present

## 2022-09-09 DIAGNOSIS — C7951 Secondary malignant neoplasm of bone: Secondary | ICD-10-CM | POA: Diagnosis not present

## 2022-09-09 DIAGNOSIS — J9621 Acute and chronic respiratory failure with hypoxia: Secondary | ICD-10-CM | POA: Diagnosis not present

## 2022-09-09 DIAGNOSIS — C3412 Malignant neoplasm of upper lobe, left bronchus or lung: Secondary | ICD-10-CM | POA: Diagnosis not present

## 2022-09-09 DIAGNOSIS — C3402 Malignant neoplasm of left main bronchus: Secondary | ICD-10-CM | POA: Diagnosis not present

## 2022-09-09 LAB — GLUCOSE, CAPILLARY
Glucose-Capillary: 139 mg/dL — ABNORMAL HIGH (ref 70–99)
Glucose-Capillary: 166 mg/dL — ABNORMAL HIGH (ref 70–99)
Glucose-Capillary: 203 mg/dL — ABNORMAL HIGH (ref 70–99)
Glucose-Capillary: 165 mg/dL — ABNORMAL HIGH (ref 70–99)
Glucose-Capillary: 178 mg/dL — ABNORMAL HIGH (ref 70–99)

## 2022-09-09 LAB — BASIC METABOLIC PANEL
Anion gap: 9 (ref 5–15)
BUN: 14 mg/dL (ref 8–23)
CO2: 21 mmol/L — ABNORMAL LOW (ref 22–32)
Calcium: 7.5 mg/dL — ABNORMAL LOW (ref 8.9–10.3)
Chloride: 102 mmol/L (ref 98–111)
Creatinine, Ser: 0.64 mg/dL (ref 0.44–1.00)
GFR, Estimated: >60 mL/min (ref >60)
Glucose, Bld: 153 mg/dL — ABNORMAL HIGH (ref 70–99)
Potassium: 4 mmol/L (ref 3.5–5.1)
Sodium: 132 mmol/L — ABNORMAL LOW (ref 135–145)

## 2022-09-09 LAB — CBC
HCT: 33.1 % — ABNORMAL LOW (ref 36.0–46.0)
Hemoglobin: 10.3 g/dL — ABNORMAL LOW (ref 12.0–15.0)
MCH: 24.6 pg — ABNORMAL LOW (ref 26.0–34.0)
MCHC: 31.1 g/dL (ref 30.0–36.0)
MCV: 79.2 fL — ABNORMAL LOW (ref 80.0–100.0)
Platelets: 449 10*3/uL — ABNORMAL HIGH (ref 150–400)
RBC: 4.18 MIL/uL (ref 3.87–5.11)
RDW: 14.3 % (ref 11.5–15.5)
WBC: 8.9 10*3/uL (ref 4.0–10.5)
nRBC: 0 % (ref 0.0–0.2)

## 2022-09-09 LAB — MAGNESIUM: Magnesium: 2.2 mg/dL (ref 1.7–2.4)

## 2022-09-09 MED ORDER — DILTIAZEM HCL 30 MG PO TABS
30.0000 mg | ORAL_TABLET | Freq: Four times a day (QID) | ORAL | Status: DC
  Administered 2022-09-09 – 2022-09-11 (×8): 30 mg via ORAL
  Filled 2022-09-09 (×8): qty 1

## 2022-09-09 MED ORDER — AMIODARONE HCL IN DEXTROSE 360-4.14 MG/200ML-% IV SOLN
30.0000 mg/h | INTRAVENOUS | Status: DC
  Administered 2022-09-09 – 2022-09-10 (×3): 30 mg/h via INTRAVENOUS
  Filled 2022-09-09 (×3): qty 200

## 2022-09-09 MED ORDER — METOPROLOL TARTRATE 5 MG/5ML IV SOLN
INTRAVENOUS | Status: AC
  Filled 2022-09-09: qty 5

## 2022-09-09 MED ORDER — AMIODARONE HCL IN DEXTROSE 360-4.14 MG/200ML-% IV SOLN
60.0000 mg/h | INTRAVENOUS | Status: AC
  Administered 2022-09-09: 60 mg/h via INTRAVENOUS
  Filled 2022-09-09: qty 200

## 2022-09-09 MED ORDER — METOPROLOL TARTRATE 5 MG/5ML IV SOLN
5.0000 mg | Freq: Four times a day (QID) | INTRAVENOUS | Status: DC | PRN
  Administered 2022-09-09 – 2022-09-13 (×3): 5 mg via INTRAVENOUS
  Filled 2022-09-09 (×2): qty 5

## 2022-09-09 MED ORDER — DILTIAZEM HCL 60 MG PO TABS: 60.0000 mg | ORAL_TABLET | Freq: Four times a day (QID) | ORAL | Status: DC

## 2022-09-09 MED ORDER — DILTIAZEM HCL-DEXTROSE 125-5 MG/125ML-% IV SOLN (PREMIX)
5.0000 mg/h | INTRAVENOUS | Status: DC
  Administered 2022-09-09: 5 mg/h via INTRAVENOUS
  Filled 2022-09-09: qty 125

## 2022-09-09 MED ORDER — SODIUM CHLORIDE 0.9 % IV SOLN: INTRAVENOUS | Status: DC | PRN

## 2022-09-09 NOTE — Progress Notes (Signed)
[name], MRN:  [id number], DOB:  05/26/1951, LOS: 10 ADMISSION DATE:  08/29/2022, CONSULTATION DATE: 08/29/2022 REFERRING MD: Dr. Flossie Buffy, CHIEF COMPLAINT: Shortness of breath, hypoxemic respiratory failure  History of Present Illness:  Patient presented to the hospital with shortness of breath Cough bringing up yellow phlegm, was recently hospitalized early February for pneumonia, sepsis Has had some chest tightness  Known to have A-fib, on anticoagulation  History of COPD, diabetes, hypertension, bronchitis  Recently diagnosed with metastatic cancer from a biopsy of scapula lesion  Known to have a left lung mass, recently had a bronchoscopy in January-patient does not recollect being told about cancer being found from that procedure, notes did not reveal endobronchial lesion on the left side.  Patient was dismissed from oncology practice secondary to multiple missed appointments-has not seen a pulmonologist in a year  She uses oxygen about 2 and half liters at home, same with exertion Uses inhalers  Pertinent  Medical History   Past Medical History:  Diagnosis Date   Bronchitis    COPD (chronic obstructive pulmonary disease) ([place])    Diabetes mellitus without complication ([place])    Hypertension    Lung cancer, primary, with metastasis from lung to other site, left ([place]) 09/04/2022    Significant Hospital Events: Including procedures, antibiotic start and stop dates in addition to other pertinent events   CT reviewed-left upper lobe mass, with probable lymphangitic spread, mediastinal adenopathy PET/CT 05/17/2022-hypermetabolic left upper lobe nodule, hypermetabolic left hilar and mediastinal lymph nodes, hypermetabolic left scapular lesion, hypermetabolic lesion in acetabulum 3/14 transferred to [facility] 3/15 received radiation therapy 3/17 A-fib with RVR, colonoscopy canceled 3/19: Left thoracostomy tube placed, complicated by what seems to be reexpansion pulmonary  edema treated with Lasix and positive pressure 3/20 improved respiratory status, starting chemotherapy.  Talc pleurodesis left CT  3/21 chest tube pulled after successful pleurodesis 3/23 AF with RVR, chest xray obtained, pccm called back  Interim History / Subjective:  Called back with concern for recurrence of effusion. Patient was sent to the floor but on arrival developed AF with RVR and was sent back to ICU. Chest xray obtained. Was called back with concern for worsening effusion. Patient laying flat comfortably on 2LNC 97%. Denies worsening dyspnea or pain  Objective   Blood pressure 121/69, pulse (!) 127, temperature 98.1 F (36.7 C), temperature source Oral, resp. rate (!) 21, height 5\' 4"  (1.626 m), weight 66.5 kg, SpO2 96 %.        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   Plan - called back  today for worsening appearance on chest xray. Clinically she seems stable without worsening dyspnea or  hypoxemia. She has a very trace effusion on the left side that I think is unlikely to be clinically significant. I certainly wouldn't put her through another procedure for this degree of effusion given her lack of symptoms and the fact that she's getting active treatment. We can follow again on Monday to see if the effusion is enlarging or changing.   Lenice Llamas, MD Pulmonary and [place] 09/09/2022 2:14 PM Pager: see AMION  If no response to pager, please call critical care on call (see AMION) until 7pm After 7:00 pm call Elink

## 2022-09-09 NOTE — Plan of Care (Signed)
Problem: Education: Goal: Ability to describe self-care measures that may prevent or decrease complications (Diabetes Survival Skills Education) will improve Outcome: Progressing Goal: Individualized Educational Video(s) Outcome: Progressing   Problem: Coping: Goal: Ability to adjust to condition or change in health will improve Outcome: Progressing   Problem: Fluid Volume: Goal: Ability to maintain a balanced intake and output will improve Outcome: Progressing   Problem: Health Behavior/Discharge Planning: Goal: Ability to identify and utilize available resources and services will improve Outcome: Progressing Goal: Ability to manage health-related needs will improve Outcome: Progressing   Problem: Metabolic: Goal: Ability to maintain appropriate glucose levels will improve Outcome: Progressing   Problem: Nutritional: Goal: Maintenance of adequate nutrition will improve Outcome: Progressing Goal: Progress toward achieving an optimal weight will improve Outcome: Progressing   Problem: Skin Integrity: Goal: Risk for impaired skin integrity will decrease Outcome: Progressing   Problem: Tissue Perfusion: Goal: Adequacy of tissue perfusion will improve Outcome: Progressing   Problem: Health Behavior/Discharge Planning: Goal: Ability to manage health-related needs will improve Outcome: Progressing   Problem: Clinical Measurements: Goal: Ability to maintain clinical measurements within normal limits will improve Outcome: Progressing Goal: Will remain free from infection Outcome: Progressing Goal: Diagnostic test results will improve Outcome: Progressing Goal: Respiratory complications will improve Outcome: Progressing Goal: Cardiovascular complication will be avoided Outcome: Progressing   Problem: Activity: Goal: Risk for activity intolerance will decrease Outcome: Progressing   Problem: Nutrition: Goal: Adequate nutrition will be maintained Outcome:  Progressing   Problem: Coping: Goal: Level of anxiety will decrease Outcome: Progressing   Problem: Elimination: Goal: Will not experience complications related to bowel motility Outcome: Progressing Goal: Will not experience complications related to urinary retention Outcome: Progressing   Problem: Pain Managment: Goal: General experience of comfort will improve Outcome: Progressing   Problem: Safety: Goal: Ability to remain free from injury will improve Outcome: Progressing   Problem: Skin Integrity: Goal: Risk for impaired skin integrity will decrease Outcome: Progressing   Problem: Education: Goal: Knowledge of General Education information will improve Description: Including pain rating scale, medication(s)/side effects and non-pharmacologic comfort measures Outcome: Progressing   Problem: Health Behavior/Discharge Planning: Goal: Ability to manage health-related needs will improve Outcome: Progressing   Problem: Clinical Measurements: Goal: Ability to maintain clinical measurements within normal limits will improve Outcome: Progressing Goal: Will remain free from infection Outcome: Progressing Goal: Diagnostic test results will improve Outcome: Progressing Goal: Respiratory complications will improve Outcome: Progressing Goal: Cardiovascular complication will be avoided Outcome: Progressing   Problem: Activity: Goal: Risk for activity intolerance will decrease Outcome: Progressing   Problem: Nutrition: Goal: Adequate nutrition will be maintained Outcome: Progressing   Problem: Coping: Goal: Level of anxiety will decrease Outcome: Progressing   Problem: Elimination: Goal: Will not experience complications related to bowel motility Outcome: Progressing Goal: Will not experience complications related to urinary retention Outcome: Progressing   Problem: Pain Managment: Goal: General experience of comfort will improve Outcome: Progressing   Problem:  Safety: Goal: Ability to remain free from injury will improve Outcome: Progressing   Problem: Skin Integrity: Goal: Risk for impaired skin integrity will decrease Outcome: Progressing   Problem: Education: Goal: Knowledge of disease or condition will improve Outcome: Progressing Goal: Knowledge of secondary prevention will improve (MUST DOCUMENT ALL) Outcome: Progressing Goal: Knowledge of patient specific risk factors will improve [place] N/A or DELETE if not current risk factor) Outcome: Progressing   Problem: Ischemic Stroke/TIA Tissue Perfusion: Goal: Complications of ischemic stroke/TIA will be minimized Outcome: Progressing   Problem: Coping:  Goal: Will verbalize positive feelings about self Outcome: Progressing Goal: Will identify appropriate support needs Outcome: Progressing

## 2022-09-09 NOTE — Progress Notes (Signed)
PROGRESS NOTE    Rhonda Blackwell  [id number] DOB: 1951/05/03 DOA: 08/29/2022 PCP: [name], MD   Brief Narrative: 72 year old with past medical history significant for COPD, diabetes type 2, CVA, hypertension, A-fib on Eliquis, metastatic disease initially with unknown primary presented to hospital with worsening shortness of breath.  Of note recent admission from 07/23/2022 1 to 07/27/2022 with sepsis secondary to pneumonia multifocal.  Discharged on 2 L of oxygen.  She was also recently diagnosed with A-fib.  Since her diagnosis she has been having shortness of breath and not feeling well.  She has a history of hypermetabolic left upper lobe nodule as well as hypermetabolic hilar and mediastinal lymph nodes, left scapular lesion, area of right acetabulum and rectum concerning for metastatic disease on PET scan 04/2022.  She was followed by [facility] pulmonology/oncology, underwent biopsy of left scapular lesion 06/08/2022 and bronchoscopy EBUS on 07/04/2022, she did not follow-up with oncology and multiple appointments.  She presented to ED and was found to be A-fib RVR.  Developed worsening hypoxia requiring up to 8 L of high flow oxygen.  CT chest showed masslike consolidation in the left upper lobe abutting the left hilar concerning for central obstructing neoplasm. peripheral areas of consolidation and interlobar septal thickening throughout the left upper lobe concerning for combination of postobstructive changes, regional metastatic disease and lymphangitic spread of tumor. Mediastinal and hilar adenopathy consistent with nodule metastasis. 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   Patient underwent thoracentesis and subsequently chest  tube placement left pleural space on 3/19.  She is undergoing radiation therapy and has been a started on chemotherapy first dose 3/20. Chest tube  was removed 3/21 after chemical pleurodesis.  Underwent flexible sigmoidoscopy on 3/22 due to abnormal rectal area on Pet scan which was normal.   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  -Acute on chronic respiratory failure likely multifactorial secondary to lung mass, postobstructive pneumonia, large left pleural effusion. -Patient oxygen requirement increased up to 15 L.  CCM consulted and underwent thoracentesis 3/17 yielding 800 cc of fluids. -CT chest showed masslike consolidation left upper lobe abutting left hilar concerning for central obstructing neoplasm. -On IV antibiotics to cover for postobstructive pneumonia. -Oncology following and concern is for adenocarcinoma of the lung. -Chest x-ray showed reaccumulation of pleural fluid.  Underwent left side pigtail catheter placement by CCM on 3/19. Underwent Chemical Pleurodesis via chest tube 3/20. -Continue with IV lasix.  -Chest tube removed 3/21. Stable to 3 L oxygen.  Repeated chest x ray 3/23; appears to have rea cumulation of fluid. CCM consulted.   2-Metastatic cancer suspect primary Lung. Lung Mass. Left Pleural; effusion.  -Bone scan: Multifocal skeletal metastases involving the spine, ribs, left scapula and minimal involvement of the left hemipelvis. -CT  head, neck: Multiple calvarial  metastasis, largest located in the right parietal bone with transcortical extension. -Patient evaluated by radiation oncology, patient was a started on radiation treatments.  She will need to complete 2-week course of treatment.  -Dr. Marin Olp following. -Port cath placement 3/18 by IR -Started  chemotherapy 3/20. She will need Chemo on 3/27 -Pleural fluid cytology: Malignant cell presents, poorly differentiated carcinoma. Likely adenocarcinoma.   3-Hypokalemia; Replaced.   4-Small acute infarct left frontal white matter, prior recent history of CVA -MRI done on 09/02/2022 for staging show a small acute infarct in the left frontal white matter.  Chronic small vessel ischemia with multiple chronic lacunar infarct.  Multiple calvarial metastasis. -CT angio head and neck no large vessel occlusion. -Had a recent 2D echo.  This has not been repeated. -LDL: 51.  A1c 6.3. -Continue with the statins.  Currently on heparin drip.  Transition to Eliquis or Lovenox when no further procedures are planned.  5-A-fib RVR:  Intermittent episode of A-fib RVR.  Intermittently on Cardizem drip. On oral Labetalol.  Change Cardizem to 60 mg Q 6 hours on 3/21 for elevated HR HR jump to 180  A fib-- BP 100. 5 Mg IV metoprolol. HR down to 120--130. SBP 112.  Discussed with cardiology, Dr Percival Spanish plan to start amiodarone gtt. Continue with oral Cardizem. I will reduce dose to 3 mg.    6-Hypomagnesemia, hypophosphatemia and hypocalcemia Replaced  Hypertension: On Cardizem, Avapro, labetalol.   Tobacco use: Cessation encouraged  Diabetes type 2: A1c 6.3 Sliding scale insulin  Chronic anxiety: Continue with Cymbalta.  Hypermetabolic activity in the rectum seen on PET scan: CEA elevated 1400 Underwent sigmoidoscopy: negative.   Hyponatremia: Continue with IV Lasix     Estimated body mass index is 25.16 kg/m as calculated from the following:   Height as of this encounter: 5\' 4"  (1.626 m).   Weight as  of this encounter: 66.5 kg.   DVT prophylaxis: Heparin  Code Status: Full code Family Communication: Care discussed with patient. Daughter at bedside.  Disposition Plan:  Status is: Inpatient Remains inpatient appropriate because: management of metastatic cancer, malignancy     Consultants:  Radiation oncology Dr Marin Olp CCM Dr Benson Norway , GI Neurology   Procedures:  CT chest 08/29/2022 Chest x-ray 08/29/2022, 09/03/2022 MRI brain 09/02/2022 Bone scan 09/01/2022 Thoracentesis per PCCM, Dr.Olalere 09/03/2022 Port-A-Cath placement 09/04/2022  Antimicrobials:  IV Unasyn   Subjective: Seen this am and was feeling well. Breathing stable.  This afternoon she develops A fib RVR while coming back from X ray. HR increase to 180---SBP 100,. Diaphoresis. After IV metoprolol given  Objective: Vitals:   09/09/22 0337 09/09/22 0400 09/09/22 0500 09/09/22 0503  BP:  (!) 103/55 (!) 128/58 (!) 128/58  Pulse:  65 70   Resp:  20 19   Temp: (!) 97.4 F (36.3 C)     TempSrc: Axillary     SpO2:  97% 96%   Weight:      Height:        Intake/Output Summary (Last 24 hours) at 09/09/2022 0704 Last data filed at 09/09/2022 0600 Gross per 24 hour  Intake 924.77 ml  Output 900 ml  Net 24.77 ml    Filed Weights   09/02/22 0526 09/03/22 1900 09/04/22 0600  Weight: 65.4 kg 65.4 kg 66.5 kg    Examination:  General exam: NAD Respiratory system: BL air movement, crackles.  Cardiovascular system: S 1, S 2 RRR Gastrointestinal system: BS present, soft, nt Central nervous  system: Alert.  Extremities: No edema    Data Reviewed: I have personally reviewed following labs and imaging studies  CBC: Recent Labs  Lab 09/03/22 0351 09/04/22 0614 09/05/22 0313 09/06/22 0650 09/07/22 0300 09/08/22 0347 09/09/22 0519  WBC 11.0* 12.2* 9.4 7.8 7.8 9.6 8.9  NEUTROABS 9.4* 10.8*  --   --   --   --   --   HGB 11.1* 10.1* 9.3* 9.2* 9.5* 9.8* 10.3*  HCT 35.3* 31.2* 29.5* 29.3* 29.9* 31.3* 33.1*   MCV 77.6* 77.2* 79.9* 80.3 79.9* 78.8* 79.2*  PLT 456* 408* 383 380 407* 434* 449*    Basic Metabolic Panel: Recent Labs  Lab 09/03/22 0351 09/04/22 0614 09/05/22 0313 09/05/22 0839 09/06/22 0650 09/07/22 0929 09/08/22 0347  NA 132* 126* 127*  --  132* 135 135  K 3.6 3.5 3.1*  --  4.2 3.5 4.0  CL 94* 93* 98  --  103 104 104  CO2 26 25 23   --  21* 23 21*  GLUCOSE 177* 131* 135*  --  131* 131* 131*  BUN 13 14 18   --  13 12 13   CREATININE 0.79 0.75 0.81  --  0.79 0.70 0.74  CALCIUM 8.8* 7.5* 6.9* 7.0* 7.2* 7.1* 7.0*  MG 2.0 2.0 2.3  --  2.2 2.2  --   PHOS  --  3.2 2.2*  --  2.6  --   --     GFR: Estimated Creatinine Clearance: 60.5 mL/min (by C-G formula based on SCr of 0.74 mg/dL). Liver Function Tests: Recent Labs  Lab 09/04/22 0614 09/05/22 0313 09/06/22 0650  AST 19 18 15   ALT 17 14 14   ALKPHOS 103 97 88  BILITOT 0.8 0.7 0.5  PROT 6.3* 5.9* 6.2*  ALBUMIN 2.7* 2.5* 2.4*    No results for input(s): "LIPASE", "AMYLASE" in the last 168 hours. No results for input(s): "AMMONIA" in the last 168 hours. Coagulation Profile: No results for input(s): "INR", "PROTIME" in the last 168 hours. Cardiac Enzymes: No results for input(s): "CKTOTAL", "CKMB", "CKMBINDEX", "TROPONINI" in the last 168 hours. BNP (last 3 results) No results for input(s): "PROBNP" in the last 8760 hours. HbA1C: No results for input(s): "HGBA1C" in the last 72 hours. CBG: Recent Labs  Lab 09/07/22 2207 09/08/22 0805 09/08/22 1403 09/08/22 1710 09/08/22 2106  GLUCAP 137* 138* 144* 187* 139*    Lipid Profile: No results for input(s): "CHOL", "HDL", "LDLCALC", "TRIG", "CHOLHDL", "LDLDIRECT" in the last 72 hours. Thyroid Function Tests: Recent Labs    09/06/22 0910  TSH 1.644    Anemia Panel: No results for input(s): "VITAMINB12", "FOLATE", "FERRITIN", "TIBC", "IRON", "RETICCTPCT" in the last 72 hours. Sepsis Labs: Recent Labs  Lab 09/03/22 0351  LATICACIDVEN 1.3     Recent  Results (from the past 240 hour(s))  Body fluid culture w Gram Stain     Status: None   Collection Time: 09/03/22  3:03 PM   Specimen: Pleural Fluid  Result Value Ref Range Status   Specimen Description   Final    PLEURAL Performed at [facility] [address], [place], [place] [address]    Special Requests   Final    NONE Performed at [facility], [place] [address] [place], [place] [address]    Gram Stain   Final    RARE WBC PRESENT, PREDOMINANTLY PMN NO ORGANISMS SEEN    Culture   Final    NO GROWTH 3 DAYS Performed at [facility], [address] [place], [place] [address]    Report Status 09/06/2022 FINAL  Final  MRSA Next Gen by PCR, Nasal     Status: None   Collection Time: 09/03/22  7:39 PM   Specimen: Nasal Mucosa; Nasal Swab  Result Value Ref Range Status   MRSA by PCR Next Gen NOT DETECTED NOT DETECTED Final    Comment: (NOTE) The GeneXpert MRSA Assay (FDA approved for NASAL specimens only), is one component of a comprehensive MRSA colonization surveillance program. It is not intended to diagnose MRSA infection nor to guide or monitor treatment for MRSA infections. Test performance is not FDA approved in patients less than 5 years old. Performed at [facility], [place] [address] [place], [place] [address]          Radiology Studies: No results found.      Scheduled Meds:  apixaban  5 mg Oral BID   atorvastatin  40 mg Oral Daily   Chlorhexidine Gluconate Cloth  6 each Topical Daily   diltiazem  60 mg Oral Q6H   dronabinol  2.5 mg Oral BID AC   DULoxetine  60 mg Oral BID   feeding supplement  237 mL Oral BID BM   fluticasone  2 spray Each Nare Daily   furosemide  40 mg Intravenous Daily   guaiFENesin  600 mg Oral BID   insulin aspart  0-6 Units Subcutaneous TID WC   irbesartan  150 mg Oral BID   labetalol  100 mg Oral BID   magnesium oxide  400 mg Oral BID   mometasone-formoterol   2 puff Inhalation BID   pantoprazole  40 mg Oral Daily   Continuous Infusions:  sodium chloride Stopped (09/07/22 1514)   ampicillin-sulbactam (UNASYN) IV Stopped (09/09/22 0536)     LOS: 11 days    Time spent: 35 minutes    Rhonda Blackwell A Veola Cafaro, MD [facility]   If 7PM-7AM, please contact night-coverage [email or web address]  09/09/2022, 7:04 AM

## 2022-09-09 NOTE — Consult Note (Signed)
Cardiology Consultation:   Patient ID: Rhonda Blackwell; [id number]; Jun 06, 1951   Admit date: 08/29/2022 Date of Consult: 09/09/2022  Primary Care Provider: [name], MD Primary Cardiologist: None ([place])    Patient Profile:   Rhonda Blackwell is a 72 y.o. female with a hx of difficult to control HTN  followed by [facility] who is being seen today for the evaluation of atrial fib at the request of Dr. Tyrell Antonio.  History of Present Illness:   Ms. Slappey has no prior cardiac history other than HTN and recently diagnosed atrial fib.   She has metastatic lung cancer..  She was in the hospital in Feb with pneumonia and sepsis.    We saw her at that time for management of atrial fib new onset.  There were strips consistent with atypical flutter vs fib and she did convert spontaneously.  She was discharged on Eliquis with a CHA2DS2 - VASc score of 7.     She was discharged on diltiazem in addition to previous BP meds. Echo demonstrated an EF of greater than 75%.  She had mild LVH. She presented back to the hospital with continued SOB. She had run out of her Eliquis.  She was in atrial fib with RVR on presentation.   Since admission she has had  atrial fib with rates difficult to control with use of PO Cardizem and beta blocker.  She required IV Cardizem which the primary team has tried to wean off.       Her hospital course has included chest tube and pleurodesis for treatment of effusion and radiation therapy and chemo with .   She has had respiratory failure requiring high flow O2.    She does not report a history of palpitations.  She does not know that she is in atrial fib currently.  She says her breathing is now improved.  The patient denies any new symptoms such as chest discomfort, neck or arm discomfort. There has been no PND or orthopnea. There have been no reported palpitations, presyncope or syncope.   Of note she has had an MRI suggesting chronic small vessel  disease, small acute left frontal white matter infarct and multiple chronic lacunar infarcts.      Past Medical History:  Diagnosis Date   Bronchitis    COPD (chronic obstructive pulmonary disease) ([place])    Diabetes mellitus without complication ([place])    Hypertension    Lung cancer, primary, with metastasis from lung to other site, left ([place]) 09/04/2022    Past Surgical History:  Procedure Laterality Date   CESAREAN SECTION     CHOLECYSTECTOMY     FLEXIBLE SIGMOIDOSCOPY N/A 09/08/2022   Procedure: FLEXIBLE SIGMOIDOSCOPY;  Surgeon: Carol Ada, MD;  Location: [facility];  Service: Gastroenterology;  Laterality: N/A;   FOOT SURGERY     HERNIA REPAIR     IR IMAGING GUIDED PORT INSERTION  09/04/2022     Home Medications:  Prior to Admission medications   Medication Sig Start Date End Date Taking? Authorizing Provider  acetaminophen (TYLENOL) 650 MG CR tablet Take 650 mg by mouth every 8 (eight) hours as needed for pain.   Yes [provider]  albuterol (PROVENTIL HFA;VENTOLIN HFA) 108 (90 BASE) MCG/ACT inhaler Inhale 2 puffs into the lungs every 6 (six) hours as needed for shortness of breath (cough). 05/28/15  Yes Mesner, Corene Cornea, MD  apixaban (ELIQUIS) 5 MG TABS tablet Take 1 tablet (5 mg total) by mouth 2 (two)  times daily. 07/28/22 08/30/22 Yes Donne Hazel, MD  atorvastatin (LIPITOR) 40 MG tablet Take 40 mg by mouth daily.   Yes [provider]  calcium carbonate (OSCAL) 1500 (600 Ca) MG TABS tablet Take 600 mg of elemental calcium by mouth daily.   Yes [provider]  diltiazem (CARDIZEM CD) 120 MG 24 hr capsule Take 1 capsule (120 mg total) by mouth daily. 07/29/22 10/28/22 Yes Donne Hazel, MD  DULoxetine (CYMBALTA) 60 MG capsule Take 60 mg by mouth 2 (two) times daily. 06/05/22  Yes [provider]  fluticasone (FLONASE) 50 MCG/ACT nasal spray Place 2 sprays into both nostrils daily.   Yes [provider]  fluticasone-salmeterol  (ADVAIR) 250-50 MCG/ACT AEPB Inhale 1 puff into the lungs 2 (two) times daily as needed (wheezing, shortness of breath.). 11/24/21  Yes [provider]  furosemide (LASIX) 20 MG tablet Take 20 mg by mouth daily. 07/18/22  Yes [provider]  irbesartan (AVAPRO) 150 MG tablet Take 150 mg by mouth 2 (two) times daily.   Yes [provider]  labetalol (NORMODYNE) 100 MG tablet Take 100 mg by mouth 2 (two) times daily.   Yes [provider]  pantoprazole (PROTONIX) 40 MG tablet Take 40 mg by mouth daily.   Yes [provider]  sitaGLIPtan-metformin (JANUMET) 50-500 MG per tablet Take 1 tablet by mouth 2 (two) times daily with a meal.   Yes [provider]  azithromycin (ZITHROMAX) 250 MG tablet Take 1 tablet (250 mg total) by mouth daily for 2 more days Patient not taking: Reported on 08/30/2022 07/28/22   Donne Hazel, MD    Inpatient Medications: Scheduled Meds:  apixaban  5 mg Oral BID   atorvastatin  40 mg Oral Daily   Chlorhexidine Gluconate Cloth  6 each Topical Daily   diltiazem  30 mg Oral Q6H   dronabinol  2.5 mg Oral BID AC   DULoxetine  60 mg Oral BID   feeding supplement  237 mL Oral BID BM   fluticasone  2 spray Each Nare Daily   furosemide  40 mg Intravenous Daily   guaiFENesin  600 mg Oral BID   insulin aspart  0-6 Units Subcutaneous TID WC   labetalol  100 mg Oral BID   magnesium oxide  400 mg Oral BID   mometasone-formoterol  2 puff Inhalation BID   pantoprazole  40 mg Oral Daily   Continuous Infusions:  sodium chloride 10 mL/hr at 09/09/22 1245   amiodarone 60 mg/hr (09/09/22 1401)   amiodarone     ampicillin-sulbactam (UNASYN) IV Stopped (09/09/22 1400)   PRN Meds: sodium chloride, acetaminophen, albuterol, bisacodyl, guaiFENesin-dextromethorphan, melatonin, metoprolol tartrate, morphine injection, mouth rinse  Allergies:    Allergies  Allergen Reactions   Bystolic [Nebivolol Hcl] Nausea Only and Other (See  Comments)    Abdominal pain   Hydrochlorothiazide Nausea Only and Swelling    Leg swelling   Norvasc [Amlodipine] Swelling    Leg swelling    Social History:   Social History   Socioeconomic History   Marital status: Married    Spouse name: Not on file   Number of children: Not on file   Years of education: Not on file   Highest education level: Not on file  Occupational History   Not on file  Tobacco Use   Smoking status: Some Days    Packs/day: 1    Types: Cigarettes   Smokeless tobacco: Never  Substance and Sexual  Activity   Alcohol use: No   Drug use: No   Sexual activity: Not on file  Other Topics Concern   Not on file  Social History Narrative   Not on file   Social Determinants of Health   Financial Resource Strain: Not on file  Food Insecurity: No Food Insecurity (08/30/2022)   Hunger Vital Sign    Worried About Running Out of Food in the Last Year: Never true    Ran Out of Food in the Last Year: Never true  Transportation Needs: No Transportation Needs (08/30/2022)   PRAPARE - Hydrologist (Medical): No    Lack of Transportation (Non-Medical): No  Physical Activity: Not on file  Stress: Not on file  Social Connections: Not on file  Intimate Partner Violence: Not At Risk (08/30/2022)   Humiliation, Afraid, Rape, and Kick questionnaire    Fear of Current or Ex-Partner: No    Emotionally Abused: No    Physically Abused: No    Sexually Abused: No    Family History:   Both parents died had stroke.  No history of CAD Family History  Problem Relation Age of Onset   Diabetes Mellitus II Sister      ROS:  Please see the history of present illness.   All other ROS reviewed and negative.     Physical Exam/Data:   Vitals:   09/09/22 1300 09/09/22 1315 09/09/22 1330 09/09/22 1400  BP: (!) 135/92 103/85 121/69 132/83  Pulse: (!) 124 (!) 102 (!) 127 (!) 131  Resp: (!) 26 (!) 31 (!) 21 (!) 24  Temp:      TempSrc:      SpO2:  96% 97% 96% 95%  Weight:      Height:        Intake/Output Summary (Last 24 hours) at 09/09/2022 1423 Last data filed at 09/09/2022 1200 Gross per 24 hour  Intake 700.69 ml  Output 1500 ml  Net -799.31 ml   Filed Weights   09/02/22 0526 09/03/22 1900 09/04/22 0600  Weight: 65.4 kg 65.4 kg 66.5 kg   Body mass index is 25.16 kg/m.  GENERAL:  Chronically ill appearing HEENT:   Pupils equal round and reactive, fundi not visualized, oral mucosa unremarkable NECK:  No  jugular venous distention, waveform within normal limits, carotid upstroke brisk and symmetric, no bruits, no thyromegaly LYMPHATICS:  No cervical, inguinal adenopathy LUNGS:  Decreased breath sounds at the bases.  BACK:  No CVA tenderness CHEST:   Unremarkable HEART:  PMI not displaced or sustained,S1 and S2 within normal limits, no S3, no S4, no clicks, no rubs,  murmurs ABD:  Flat, positive bowel sounds normal in frequency in pitch, no bruits, no rebound, no guarding, no midline pulsatile mass, no hepatomegaly, no splenomegaly EXT:  2 plus pulses throughout, no  edema, no cyanosis no clubbing SKIN:  No rashes no nodules NEURO:   Cranial nerves II through XII grossly intact, motor grossly intact throughout PSYCH:    Cognitively intact, oriented to person place and time   EKG:  The EKG was personally reviewed and demonstrates:  Atrial fib, rate 159, intervals WNL, no acute ST T wave changes Telemetry:  Telemetry was personally reviewed and demonstrates:  Atrial fib with RVR, converted to NSR during the exam.   Relevant CV Studies: See echo elsewhere  Laboratory Data:  Chemistry Recent Labs  Lab 09/07/22 0929 09/08/22 0347 09/09/22 0822  NA 135 135 132*  K 3.5 4.0  4.0  CL 104 104 102  CO2 23 21* 21*  GLUCOSE 131* 131* 153*  BUN 12 13 14   CREATININE 0.70 0.74 0.64  CALCIUM 7.1* 7.0* 7.5*  GFRNONAA >60 >60 >60  ANIONGAP 8 10 9     Recent Labs  Lab 09/04/22 0614 09/05/22 0313 09/06/22 0650  PROT 6.3*  5.9* 6.2*  ALBUMIN 2.7* 2.5* 2.4*  AST 19 18 15   ALT 17 14 14   ALKPHOS 103 97 88  BILITOT 0.8 0.7 0.5   Hematology Recent Labs  Lab 09/07/22 0300 09/08/22 0347 09/09/22 0519  WBC 7.8 9.6 8.9  RBC 3.74* 3.97 4.18  HGB 9.5* 9.8* 10.3*  HCT 29.9* 31.3* 33.1*  MCV 79.9* 78.8* 79.2*  MCH 25.4* 24.7* 24.6*  MCHC 31.8 31.3 31.1  RDW 14.1 14.1 14.3  PLT 407* 434* 449*   Cardiac EnzymesNo results for input(s): "TROPONINI" in the last 168 hours. No results for input(s): "TROPIPOC" in the last 168 hours.  BNPNo results for input(s): "BNP", "PROBNP" in the last 168 hours.  DDimer No results for input(s): "DDIMER" in the last 168 hours.  Radiology/Studies:  DG Chest Port 1 View  Result Date: 09/07/2022 CLINICAL DATA:  [id number] with pneumonia, left chest tube. EXAM: PORTABLE CHEST 1 VIEW COMPARISON:  Portable chest yesterday at 4:47 a.m. FINDINGS: 4:39 a.m. There is stable cardiomegaly with mild vascular prominence and interstitial edema. Left upper lobe perihilar opacity extending to the periphery is again noted consistent with pneumonia or mass. Left lower lobe consolidation appears similar with left pleural effusion and unchanged positioning of pigtail left chest tube. There is no visible pneumothorax. No consolidation is seen on the right. Right IJ port catheter positioning is stable in the distal SVC. Stable mediastinum with calcification and tortuosity of aorta. Overall aeration seems unchanged.  No new abnormality. IMPRESSION: No significant change in the appearance of the chest. Left upper lobe perihilar opacity extending to the periphery is again noted consistent with pneumonia or mass. Left lower lobe consolidation and left pleural effusion appear similar. Cardiomegaly with mild interstitial edema. Electronically Signed   By: Telford Nab M.D.   On: 09/07/2022 06:41   DG Chest Port 1 View  Result Date: 09/06/2022 CLINICAL DATA:  [id number] with pleural effusion and left pigtail chest tube.  EXAM: PORTABLE CHEST 1 VIEW COMPARISON:  Lat chest yesterday at 12:09 p.m. FINDINGS: 4:47 a.m. Pigtail left chest tube positioning is stable in the lower lateral left thorax. There is no visible pneumothorax. There is mild cardiomegaly. The aorta is tortuous with calcification in the transverse segment. Stable mediastinum. Small increased left pleural effusion noted today with increased left lower lobe opacity consistent with atelectasis, pneumonia or aspiration. Left perihilar infiltrate or mass appears similar. The lungs show increased interstitial markings which could be due to edema or pneumonitis. Stable small right pleural effusion. Right-sided aeration is unchanged with no focal consolidation. There is osteopenia and thoracic spondylosis. IMPRESSION: 1. Increased left pleural effusion and left lower lobe opacity consistent with atelectasis, pneumonia or aspiration. 2. Left perihilar infiltrate or mass appears similar. 3. Increased interstitial markings which could be due to edema or pneumonitis. 4. Stable small right pleural effusion.  Cardiomegaly. 5. Aortic atherosclerosis and uncoiling. Electronically Signed   By: Telford Nab M.D.   On: 09/06/2022 07:12   IR IMAGING GUIDED PORT INSERTION  Result Date: 09/04/2022 INDICATION: 72 year old with metastatic lung cancer. Port-A-Cath needed to assist with patient's treatment. EXAM: FLUOROSCOPIC AND ULTRASOUND GUIDED PLACEMENT OF A SUBCUTANEOUS PORT COMPARISON:  None Available. MEDICATIONS: Moderate sedation ANESTHESIA/SEDATION: Moderate (conscious) sedation was employed during this procedure. A total of Versed 0.5 mg and fentanyl 25 mcg was administered intravenously at the order of the provider performing the procedure. Total intra-service moderate sedation time: 24 minutes. Patient's level of consciousness and vital signs were monitored continuously by radiology nurse throughout the procedure under the supervision of the provider performing the procedure.  FLUOROSCOPY TIME:  Radiation Exposure Index (as provided by the fluoroscopic device): 1 mGy Kerma COMPLICATIONS: None immediate. PROCEDURE: The procedure, risks, benefits, and alternatives were explained to the patient/family. Questions regarding the procedure were encouraged and answered. Informed consent was obtained for the procedure. Patient was placed supine on the interventional table. Ultrasound confirmed a patent right internal jugular vein. Ultrasound image was saved for documentation. The right chest and neck were cleaned with a skin antiseptic and a sterile drape was placed. Maximal barrier sterile technique was utilized including caps, mask, sterile gowns, sterile gloves, sterile drape, hand hygiene and skin antiseptic. The right neck was anesthetized with 1% lidocaine. Small incision was made in the right neck with a blade. Micropuncture set was placed in the right internal jugular vein with ultrasound guidance. The micropuncture wire was used for measurement purposes. The right chest was anesthetized with 1% lidocaine with epinephrine. #15 blade was used to make an incision and a subcutaneous port pocket was formed. [place] was assembled. Subcutaneous tunnel was formed with a stiff tunneling device. The port catheter was brought through the subcutaneous tunnel. The port was placed in the subcutaneous pocket. The micropuncture set was exchanged for a peel-away sheath. The catheter was placed through the peel-away sheath and the tip was positioned at the superior cavoatrial junction. Catheter placement was confirmed with fluoroscopy. The port was accessed and flushed with saline. The port pocket was closed using two layers of absorbable sutures and Dermabond. The vein skin site was closed using a single layer of absorbable suture and Dermabond. Sterile dressings were applied. Patient tolerated the procedure well without an immediate complication. Ultrasound and fluoroscopic images were taken  and saved for this procedure. IMPRESSION: Placement of a subcutaneous power-injectable port device. Catheter tip at the superior cavoatrial junction. Electronically Signed   By: Markus Daft M.D.   On: 09/04/2022 17:45    Assessment and Plan:   Atrial fib with RVR:   Continue beta blocker and Cardizem and we will consolidate this dose.  For now start IV amiodarone without a bolus for rate control (and possibly rhythm control).  She has been on anticoagulation.  We can transition eventually to po amiodarone.  I don't think this will be contraindicated while we see how she progresses with her cancer therapy.  If she improves she will be less likely to need the amiodarone long term.      HTN:  BP has actually been running low and will precluded further rate control med titration with calcium channel blocker or beta blocker.    For questions or updates, please contact [facility] Please consult [email or web address] for contact info under Cardiology/STEMI.   Signed, Minus Breeding, MD  09/09/2022 2:23 PM

## 2022-09-09 NOTE — Progress Notes (Signed)
Everything is looking pretty good for Ms. Rhonda Blackwell.  She is resting.  She had a sigmoidoscopy which looked great.  There is no evidence of rectal cancer.  She had no problems with radiation therapy yesterday.  So far, she has had no problems with any side effects from chemotherapy.  Her daughter said that she sat up in a chair for about an hour.  She is eating better.  I think that Rhonda Blackwell might be helping this.  Her atrial fibrillation seems to be improving.  Her heart monitor shows sinus rhythm.  She is on Eliquis now.  Her CBC today shows white count 8.9.  Hemoglobin 10.3.  Platelet count 449,000.  There has been no bleeding.  She has had no diarrhea.  She has not complained of any obvious pain.  I would think that she probably has another for 5 radiation treatments.  I would like to give her another dose of chemotherapy to try to help with opening of her left lung.  Vital signs show temperature of 97.4.  Pulse 70.  Blood pressure 128/58.  Her lungs sound clear over on the right side.  There is still some decreased on the left side.  Cardiac exam regular rate and rhythm.  Abdomen is soft.  There is no hepatosplenomegaly.  Bowel sounds are present.  Extremity shows no clubbing, cyanosis or edema.  Neurological exam is nonfocal.  Hopefully, Rhonda Blackwell can be moved out of the ICU.  I think she is doing quite nicely.  She probably needs to have another chest x-ray in a couple days to see how any fluid is doing.  She had a chest tube removed couple days ago.  We still have a long ways to go.  At least, I think she is tolerating treatment well.  It is still a little bit early to know whether or not treatment is working.  We will continue to follow along.  I suspect that she might need to go to skilled nursing when she is medically ready so that she can strengthen up before she goes home.  I do appreciate the great care that she is gotten from the staff in the ICU.  Rodman Key 21:9

## 2022-09-09 NOTE — Progress Notes (Signed)
Transferred patient to 2 view x-ray down on first floor then to [place]. Patient was hemodynamically stable prior to transferring to x-ray and [place]. Upon arrival to [place] (1613), patient began developing shortness of breath and diaphoresis. Telemetry monitoring showing rapid HR in 170s-180s. Immediately notified ICU Charge RN that transfer would be canceled due to patient appearing to be in Afib RVR. Transferred patient back to ICU room, 1234. Obtained EKG, which showed Afib RVR. BP remaining normotensive. MD Regalado was notified of the situation. MD Regalado ordered to give 5mg  of lopressor IV and start a cardizem gtt. Will continue to monitor patient in ICU/SD unit.

## 2022-09-10 DIAGNOSIS — J9621 Acute and chronic respiratory failure with hypoxia: Secondary | ICD-10-CM | POA: Diagnosis not present

## 2022-09-10 LAB — GLUCOSE, CAPILLARY
Glucose-Capillary: 174 mg/dL — ABNORMAL HIGH (ref 70–99)
Glucose-Capillary: 176 mg/dL — ABNORMAL HIGH (ref 70–99)
Glucose-Capillary: 166 mg/dL — ABNORMAL HIGH (ref 70–99)
Glucose-Capillary: 187 mg/dL — ABNORMAL HIGH (ref 70–99)
Glucose-Capillary: 167 mg/dL — ABNORMAL HIGH (ref 70–99)

## 2022-09-10 LAB — COMPREHENSIVE METABOLIC PANEL
ALT: 18 U/L (ref 0–44)
AST: 16 U/L (ref 15–41)
Albumin: 2.6 g/dL — ABNORMAL LOW (ref 3.5–5.0)
Alkaline Phosphatase: 86 U/L (ref 38–126)
Anion gap: 10 (ref 5–15)
BUN: 16 mg/dL (ref 8–23)
CO2: 21 mmol/L — ABNORMAL LOW (ref 22–32)
Calcium: 7.8 mg/dL — ABNORMAL LOW (ref 8.9–10.3)
Chloride: 100 mmol/L (ref 98–111)
Creatinine, Ser: 0.74 mg/dL (ref 0.44–1.00)
GFR, Estimated: >60 mL/min (ref >60)
Glucose, Bld: 162 mg/dL — ABNORMAL HIGH (ref 70–99)
Potassium: 3.8 mmol/L (ref 3.5–5.1)
Sodium: 131 mmol/L — ABNORMAL LOW (ref 135–145)
Total Bilirubin: 0.5 mg/dL (ref 0.3–1.2)
Total Protein: 6.6 g/dL (ref 6.5–8.1)

## 2022-09-10 LAB — CBC
HCT: 31.9 % — ABNORMAL LOW (ref 36.0–46.0)
Hemoglobin: 10.2 g/dL — ABNORMAL LOW (ref 12.0–15.0)
MCH: 25 pg — ABNORMAL LOW (ref 26.0–34.0)
MCHC: 32 g/dL (ref 30.0–36.0)
MCV: 78.2 fL — ABNORMAL LOW (ref 80.0–100.0)
Platelets: 471 10*3/uL — ABNORMAL HIGH (ref 150–400)
RBC: 4.08 MIL/uL (ref 3.87–5.11)
RDW: 14.4 % (ref 11.5–15.5)
WBC: 10.3 10*3/uL (ref 4.0–10.5)
nRBC: 0.2 % (ref 0.0–0.2)

## 2022-09-10 LAB — CHOLESTEROL, BODY FLUID: Cholesterol, Fluid: 43 mg/dL

## 2022-09-10 MED ORDER — POLYETHYLENE GLYCOL 3350 17 G PO PACK
17.0000 g | PACK | Freq: Every day | ORAL | Status: DC
  Administered 2022-09-10: 17 g via ORAL
  Filled 2022-09-10: qty 1

## 2022-09-10 MED ORDER — SENNOSIDES-DOCUSATE SODIUM 8.6-50 MG PO TABS
1.0000 | ORAL_TABLET | Freq: Two times a day (BID) | ORAL | Status: DC
  Administered 2022-09-10 – 2022-09-14 (×7): 1 via ORAL
  Filled 2022-09-10 (×8): qty 1

## 2022-09-10 MED ORDER — POTASSIUM CHLORIDE CRYS ER 20 MEQ PO TBCR
40.0000 meq | EXTENDED_RELEASE_TABLET | Freq: Once | ORAL | Status: AC
  Administered 2022-09-10: 40 meq via ORAL
  Filled 2022-09-10: qty 2

## 2022-09-10 NOTE — Progress Notes (Signed)
Progress Note  Patient Name: Rhonda Blackwell Date of Encounter: 09/10/2022  Primary Cardiologist: [facility]  Interval Summary   Chart reviewed, patient converted to sinus rhythm after initiation of IV amiodarone yesterday.  Discussed with nursing.  Patient resting this morning.  I did not wake her up.  Vital Signs    Vitals:   09/10/22 0400 09/10/22 0500 09/10/22 0600 09/10/22 0700  BP: 130/61 (!) 156/66 119/60 135/64  Pulse: 71 82 72 71  Resp: 20 (!) 24 (!) 22 18  Temp:  98.4 F (36.9 C)    TempSrc:  Oral    SpO2: 93% (!) 84% 90% 99%  Weight:      Height:        Intake/Output Summary (Last 24 hours) at 09/10/2022 0805 Last data filed at 09/10/2022 [id number] Gross per 24 hour  Intake 1303.02 ml  Output 2000 ml  Net -696.98 ml   Filed Weights   09/02/22 0526 09/03/22 1900 09/04/22 0600  Weight: 65.4 kg 65.4 kg 66.5 kg    ECG/Telemetry    Tracing from 09/09/2022 showed coarse atrial fibrillation with rightward axis and pulmonary disease pattern.    Telemetry today shows sinus rhythm.  Labs    Chemistry Recent Labs  Lab 09/04/22 [id number] 09/05/22 0313 09/05/22 0839 09/06/22 0650 09/07/22 0929 09/08/22 0347 09/09/22 0822  NA 126* 127*  --  132* 135 135 132*  K 3.5 3.1*  --  4.2 3.5 4.0 4.0  CL 93* 98  --  103 104 104 102  CO2 25 23  --  21* 23 21* 21*  GLUCOSE 131* 135*  --  131* 131* 131* 153*  BUN 14 18  --  13 12 13 14   CREATININE 0.75 0.81  --  0.79 0.70 0.74 0.64  CALCIUM 7.5* 6.9*   < > 7.2* 7.1* 7.0* 7.5*  PROT 6.3* 5.9*  --  6.2*  --   --   --   ALBUMIN 2.7* 2.5*  --  2.4*  --   --   --   AST 19 18  --  15  --   --   --   ALT 17 14  --  14  --   --   --   ALKPHOS 103 97  --  88  --   --   --   BILITOT 0.8 0.7  --  0.5  --   --   --   GFRNONAA >60 >60  --  >60 >60 >60 >60  ANIONGAP 8 6  --  8 8 10 9    < > = values in this interval not displayed.    Hematology Recent Labs  Lab 09/08/22 0347 09/09/22 0519 09/10/22 0736  WBC 9.6 8.9 10.3   RBC 3.97 4.18 4.08  HGB 9.8* 10.3* 10.2*  HCT 31.3* 33.1* 31.9*  MCV 78.8* 79.2* 78.2*  MCH 24.7* 24.6* 25.0*  MCHC 31.3 31.1 32.0  RDW 14.1 14.3 14.4  PLT 434* 449* 471*   Cardiac Enzymes Recent Labs  Lab 08/29/22 1400 08/29/22 1700  TROPONINIHS 30* 79*   Lipid Panel     Component Value Date/Time   CHOL 114 09/03/2022 0351   TRIG 106 09/03/2022 0351   HDL 42 09/03/2022 0351   CHOLHDL 2.7 09/03/2022 0351   VLDL 21 09/03/2022 0351   LDLCALC 51 09/03/2022 0351    Cardiac Studies   Echocardiogram 07/27/2022:  1. Left ventricular ejection fraction, by estimation, is >75%. The left  ventricle  has hyperdynamic function. The left ventricle has no regional  wall motion abnormalities. There is mild concentric left ventricular  hypertrophy. Left ventricular diastolic  parameters are consistent with Grade I diastolic dysfunction (impaired  relaxation).   2. Right ventricular systolic function is normal. The right ventricular  size is normal.   3. The mitral valve is normal in structure. Trivial mitral valve  regurgitation. No evidence of mitral stenosis.   4. The aortic valve is tricuspid. Aortic valve regurgitation is not  visualized. No aortic stenosis is present.   5. The inferior vena cava is normal in size with greater than 50%  respiratory variability, suggesting right atrial pressure of 3 mmHg.    Assessment & Plan   1.  Paroxysmal atrial fibrillation with CHA2DS2-VASc score of 7.  She is on Eliquis for stroke prophylaxis and has converted to sinus rhythm since initiation of IV amiodarone yesterday.  Also on Cardizem 30 mg p.o. every 6 hours.  2.  Essential hypertension, systolics running [id number] range most recently.  She continues on labetalol.  3.  Metastatic lung cancer with presumed malignant left pleural effusion status post pleurodesis.  Reviewed cardiology consultation note from yesterday, discussed case with nursing.  Would continue IV amiodarone through  today and then convert to oral amiodarone 200 mg twice daily starting tomorrow.  If rhythm remains stable, would then convert short acting Cardizem to Cardizem CD 120 mg daily.  For questions or updates, please contact [place] Please consult [email or web address] for contact info under   Signed, Rozann Lesches, MD  09/10/2022, 8:05 AM

## 2022-09-10 NOTE — Progress Notes (Signed)
PROGRESS NOTE    Rhonda Blackwell  [id number] DOB: 08-Dec-1950 DOA: 08/29/2022 PCP: [name], MD   Brief Narrative: 72 year old with past medical history significant for COPD, diabetes type 2, CVA, hypertension, A-fib on Eliquis, metastatic disease initially with unknown primary presented to hospital with worsening shortness of breath.  Of note recent admission from 07/23/2022 1 to 07/27/2022 with sepsis secondary to pneumonia multifocal.  Discharged on 2 L of oxygen.  She was also recently diagnosed with A-fib.  Since her diagnosis she has been having shortness of breath and not feeling well.  She has a history of hypermetabolic left upper lobe nodule as well as hypermetabolic hilar and mediastinal lymph nodes, left scapular lesion, area of right acetabulum and rectum concerning for metastatic disease on PET scan 04/2022.  She was followed by [facility] pulmonology/oncology, underwent biopsy of left scapular lesion 06/08/2022 and bronchoscopy EBUS on 07/04/2022, she did not follow-up with oncology and multiple appointments.  She presented to ED and was found to be A-fib RVR.  Developed worsening hypoxia requiring up to 8 L of high flow oxygen.  CT chest showed masslike consolidation in the left upper lobe abutting the left hilar concerning for central obstructing neoplasm. peripheral areas of consolidation and interlobar septal thickening throughout the left upper lobe concerning for combination of postobstructive changes, regional metastatic disease and lymphangitic spread of tumor. Mediastinal and hilar adenopathy consistent with nodule metastasis. 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   Patient underwent thoracentesis and subsequently chest  tube placement left pleural space on 3/19.  She is undergoing radiation therapy and has been a started on chemotherapy first dose 3/20. Chest tube  was removed 3/21 after chemical pleurodesis.  Underwent flexible sigmoidoscopy on 3/22 due to abnormal rectal area on Pet scan which was normal.   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  -Acute on chronic respiratory failure likely multifactorial secondary to lung mass, postobstructive pneumonia, large left pleural effusion. -Patient oxygen requirement increased up to 15 L.  CCM consulted and underwent thoracentesis 3/17 yielding 800 cc of fluids. -CT chest showed masslike consolidation left upper lobe abutting left hilar concerning for central obstructing neoplasm. -On IV antibiotics to cover for postobstructive pneumonia. Day 10 of antibiotics.  -Oncology following and concern is for adenocarcinoma of the lung. -Chest x-ray showed reaccumulation of pleural fluid.  Underwent left side pigtail catheter placement by CCM on 3/19. Underwent Chemical Pleurodesis via chest tube 3/20. -Continue with IV lasix.  -Chest tube removed 3/21. Stable to 3 L oxygen.  Repeated chest x ray 3/23; appears to have re- accumulation of fluid. CCM consulted.  Plan to repeat chest x ray tomorrow.   2-Metastatic cancer suspect primary Lung. Lung Mass. Left Pleural; effusion.  -Bone scan: Multifocal skeletal metastases involving  the spine, ribs, left scapula and minimal involvement of  the left hemipelvis. -CT head, neck: Multiple calvarial metastasis, largest located in the right parietal bone with transcortical extension. -Patient evaluated by radiation oncology, patient was a started on radiation treatments.  She will need to complete 2-week course of treatment.  -Dr. Marin Olp following. -Port cath placement 3/18 by IR -Started  chemotherapy 3/20. She will need Chemo on 3/27 -Pleural fluid cytology: Malignant cell presents, poorly differentiated carcinoma. Likely adenocarcinoma.   3-Hypokalemia; Replaced.   4-Small acute infarct left frontal white matter, prior recent history of CVA -MRI done on 09/02/2022 for staging show a small acute infarct in the left frontal white matter.  Chronic small vessel ischemia with multiple chronic lacunar infarct.  Multiple calvarial metastasis. -CT angio head and neck no large vessel occlusion. -Had a recent 2D echo.  This has not been repeated. -LDL: 51.  A1c 6.3. -Continue with the statins. -Transition to Eliquis.   5-A-fib RVR:  Intermittent episode of A-fib RVR.  Intermittently on Cardizem drip. On oral Labetalol.  3-23: HR jump to 180  A fib-- BP 100. 5 Mg IV metoprolol. HR down to 120--130. SBP 112.  Started IV amiodarone, now in sinus rhythm.  On Cardizem 30 mg every 6 hours.  On Eliquis.  Appreciate cardiology assistance.    6-Hypomagnesemia, hypophosphatemia and hypocalcemia Replaced  Hypertension: On Cardizem, Avapro, labetalol.   Tobacco use: Cessation encouraged  Diabetes type 2: A1c 6.3 Sliding scale insulin  Chronic anxiety: Continue with Cymbalta.  Hypermetabolic activity in the rectum seen on PET scan: CEA elevated 1400 Underwent sigmoidoscopy: negative.   Hyponatremia: Continue with IV Lasix     Estimated body mass index is 25.16 kg/m as calculated from the following:   Height as of this encounter: 5\' 4"  (1.626 m).   Weight as of this encounter: 66.5 kg.   DVT prophylaxis: Heparin  Code  Status: Full code Family Communication: Care discussed with patient. Daughter at bedside.  Disposition Plan:  Status is: Inpatient Remains inpatient appropriate because: management of metastatic cancer, malignancy     Consultants:  Radiation oncology Dr Marin Olp CCM Dr Benson [place] , GI Neurology   Procedures:  CT chest 08/29/2022 Chest x-ray 08/29/2022, 09/03/2022 MRI brain 09/02/2022 Bone scan 09/01/2022 Thoracentesis per PCCM, Dr.Olalere 09/03/2022 Port-A-Cath placement 09/04/2022  Antimicrobials:  IV Unasyn   Subjective: She is feeling well, per family she started to eat more.  She is breathing better.  No BM since sigmoidoscopy   Objective: Vitals:   09/10/22 0400 09/10/22 0500 09/10/22 0600 09/10/22 0700  BP: 130/61 (!) 156/66 119/60 135/64  Pulse: 71 82 72 71  Resp: 20 (!) 24 (!) 22 18  Temp:  98.4 F (36.9 C)    TempSrc:  Oral    SpO2: 93% (!) 84% 90% 99%  Weight:      Height:        Intake/Output Summary (Last 24 hours) at 09/10/2022 0715 Last data filed at 09/10/2022 [id number] Gross per 24 hour  Intake 1308.02 ml  Output 2000 ml  Net -691.98 ml    Filed Weights   09/02/22 0526 09/03/22 1900 09/04/22 0600  Weight: 65.4 kg 65.4 kg 66.5 kg    Examination:  General exam: NAD Respiratory system: BL crackles.  Cardiovascular system: S 1, S 2 RRR Gastrointestinal system: BS present, soft, nt Central nervous system: Alert, follows command.  Extremities: No edema     Data Reviewed: I have personally reviewed following labs and imaging studies  CBC: Recent Labs  Lab 09/04/22 0614 09/05/22 0313 09/06/22 0650 09/07/22 0300 09/08/22 0347 09/09/22 0519  WBC 12.2* 9.4 7.8 7.8 9.6 8.9  NEUTROABS 10.8*  --   --   --   --   --   HGB 10.1* 9.3* 9.2* 9.5* 9.8* 10.3*  HCT 31.2* 29.5* 29.3* 29.9* 31.3* 33.1*  MCV 77.2* 79.9* 80.3 79.9* 78.8* 79.2*  PLT 408* 383 380 407* 434* 449*    Basic Metabolic Panel: Recent Labs  Lab 09/04/22 0614 09/05/22 0313  09/05/22 0839 09/06/22 0650 09/07/22 0929 09/08/22 0347 09/09/22 0822  NA 126* 127*  --  132* 135 135 132*  K 3.5 3.1*  --  4.2 3.5 4.0 4.0  CL 93* 98  --  103 104 104 102  CO2 25 23  --  21* 23 21* 21*  GLUCOSE 131* 135*  --  131* 131* 131* 153*  BUN 14 18  --  13 12 13 14   CREATININE 0.75 0.81  --  0.79 0.70 0.74 0.64  CALCIUM 7.5* 6.9* 7.0* 7.2* 7.1* 7.0* 7.5*  MG 2.0 2.3  --  2.2 2.2  --  2.2  PHOS 3.2 2.2*  --  2.6  --   --   --     GFR: Estimated Creatinine Clearance: 60.5 mL/min (by C-G formula based on SCr of 0.64 mg/dL). Liver Function Tests: Recent Labs  Lab 09/04/22 0614 09/05/22 0313 09/06/22 0650  AST 19 18 15   ALT 17 14 14   ALKPHOS 103 97 88  BILITOT 0.8 0.7 0.5  PROT 6.3* 5.9* 6.2*  ALBUMIN 2.7* 2.5* 2.4*    No results for input(s): "LIPASE", "AMYLASE" in the last 168 hours. No results for input(s): "AMMONIA" in the last 168 hours. Coagulation Profile: No results for input(s): "INR", "PROTIME" in the last 168 hours. Cardiac Enzymes: No results for input(s): "CKTOTAL", "CKMB", "CKMBINDEX", "TROPONINI" in the last 168 hours. BNP (last 3 results) No results for input(s): "PROBNP" in the last 8760 hours. HbA1C: No results for input(s): "HGBA1C" in the last 72 hours. CBG: Recent Labs  Lab 09/08/22 2106 09/09/22 0758 09/09/22 1253 09/09/22 1723 09/09/22 2159  GLUCAP 139* 166* 203* 165* 178*    Lipid Profile: No results for input(s): "CHOL", "HDL", "LDLCALC", "TRIG", "CHOLHDL", "LDLDIRECT" in the last 72 hours. Thyroid Function Tests: No results for input(s): "TSH", "T4TOTAL", "FREET4", "T3FREE", "THYROIDAB" in the last 72 hours.  Anemia Panel: No results for input(s): "VITAMINB12", "FOLATE", "FERRITIN", "TIBC", "IRON", "RETICCTPCT" in the last 72 hours. Sepsis Labs: No results for input(s): "PROCALCITON", "LATICACIDVEN" in the last 168 hours.   Recent Results (from the past 240 hour(s))  Body fluid culture w Gram Stain     Status: None    Collection Time: 09/03/22  3:03 PM   Specimen: Pleural Fluid  Result Value Ref Range Status   Specimen Description   Final    PLEURAL Performed at [facility] [address] [place], [place] [address]    Special Requests   Final    NONE Performed at [facility], [place] [address] [place], [place] [address]    Gram Stain   Final    RARE WBC PRESENT, PREDOMINANTLY PMN NO ORGANISMS SEEN    Culture   Final    NO GROWTH 3 DAYS Performed at [facility] [address] [place], [place] [address]    Report Status 09/06/2022 FINAL  Final  MRSA Next Gen by PCR, Nasal  Status: None   Collection Time: 09/03/22  7:39 PM   Specimen: Nasal Mucosa; Nasal Swab  Result Value Ref Range Status   MRSA by PCR Next Gen NOT DETECTED NOT DETECTED Final    Comment: (NOTE) The GeneXpert MRSA Assay (FDA approved for NASAL specimens only), is one component of a comprehensive MRSA colonization surveillance program. It is not intended to diagnose MRSA infection nor to guide or monitor treatment for MRSA infections. Test performance is not FDA approved in patients less than 66 years old. Performed at [facility], [place] [address] [place], [place] [address]          Radiology Studies: DG Chest 2 View  Result Date: 09/09/2022 CLINICAL DATA:  Show this of breath, history of pleural effusion and prior thoracentesis EXAM: CHEST - 2 VIEW COMPARISON:  09/07/2022 FINDINGS: Mild volume loss no LEFT hemithorax with mediastinal shift to the LEFT. RIGHT jugular Port-A-Cath with tip projecting over SVC. Atherosclerotic calcification aorta. Heart obscured by moderate pleural effusion, LEFT basilar atelectasis, and LEFT lung infiltrate. Previously seen pigtail catheter no longer identified. RIGHT lung clear. No pneumothorax. IMPRESSION: Increased LEFT pleural effusion and LEFT basilar atelectasis with persistent LEFT lung infiltrate. Aortic  Atherosclerosis (ICD10-I70.0). Electronically Signed   By: Lavonia Dana M.D.   On: 09/09/2022 14:55        Scheduled Meds:  apixaban  5 mg Oral BID   atorvastatin  40 mg Oral Daily   Chlorhexidine Gluconate Cloth  6 each Topical Daily   diltiazem  30 mg Oral Q6H   dronabinol  2.5 mg Oral BID AC   DULoxetine  60 mg Oral BID   feeding supplement  237 mL Oral BID BM   fluticasone  2 spray Each Nare Daily   furosemide  40 mg Intravenous Daily   guaiFENesin  600 mg Oral BID   insulin aspart  0-6 Units Subcutaneous TID WC   labetalol  100 mg Oral BID   magnesium oxide  400 mg Oral BID   mometasone-formoterol  2 puff Inhalation BID   pantoprazole  40 mg Oral Daily   Continuous Infusions:  sodium chloride 10 mL/hr at 09/10/22 0600   sodium chloride 10 mL/hr at 09/10/22 [id number]   amiodarone 30 mg/hr (09/10/22 0600)   ampicillin-sulbactam (UNASYN) IV Stopped (09/10/22 0643)     LOS: 12 days    Time spent: 35 minutes    Danzell Birky A Rmani Kapusta, MD [facility]   If 7PM-7AM, please contact night-coverage [email or web address]  09/10/2022, 7:15 AM

## 2022-09-11 ENCOUNTER — Ambulatory Visit
Admission: RE | Admit: 2022-09-11 | Discharge: 2022-09-11 | Disposition: A | Discharge status: Home / Self Care | Payer: Medicare Other | Source: Ambulatory Visit | Attending: Radiation Oncology | Admitting: Radiation Oncology

## 2022-09-11 ENCOUNTER — Other Ambulatory Visit: Payer: Self-pay

## 2022-09-11 ENCOUNTER — Inpatient Hospital Stay (HOSPITAL_COMMUNITY): Payer: Medicare Other

## 2022-09-11 DIAGNOSIS — C3402 Malignant neoplasm of left main bronchus: Secondary | ICD-10-CM | POA: Diagnosis not present

## 2022-09-11 DIAGNOSIS — I4819 Other persistent atrial fibrillation: Secondary | ICD-10-CM

## 2022-09-11 LAB — RAD ONC ARIA SESSION SUMMARY
Course Elapsed Days: 10
Plan Fractions Treated to Date: 6
Plan Prescribed Dose Per Fraction: 3 Gy
Plan Total Fractions Prescribed: 8
Plan Total Prescribed Dose: 24 Gy
Reference Point Dosage Given to Date: 18 Gy
Reference Point Session Dosage Given: 3 Gy
Session Number: 7

## 2022-09-11 LAB — CBC
HCT: 32.3 % — ABNORMAL LOW (ref 36.0–46.0)
Hemoglobin: 10.2 g/dL — ABNORMAL LOW (ref 12.0–15.0)
MCH: 24.8 pg — ABNORMAL LOW (ref 26.0–34.0)
MCHC: 31.6 g/dL (ref 30.0–36.0)
MCV: 78.4 fL — ABNORMAL LOW (ref 80.0–100.0)
Platelets: 519 10*3/uL — ABNORMAL HIGH (ref 150–400)
RBC: 4.12 MIL/uL (ref 3.87–5.11)
RDW: 14.4 % (ref 11.5–15.5)
WBC: 8.9 10*3/uL (ref 4.0–10.5)
nRBC: 0.3 % — ABNORMAL HIGH (ref 0.0–0.2)

## 2022-09-11 LAB — GLUCOSE, CAPILLARY
Glucose-Capillary: 154 mg/dL — ABNORMAL HIGH (ref 70–99)
Glucose-Capillary: 158 mg/dL — ABNORMAL HIGH (ref 70–99)
Glucose-Capillary: 178 mg/dL — ABNORMAL HIGH (ref 70–99)
Glucose-Capillary: 138 mg/dL — ABNORMAL HIGH (ref 70–99)
Glucose-Capillary: 159 mg/dL — ABNORMAL HIGH (ref 70–99)

## 2022-09-11 LAB — BASIC METABOLIC PANEL
Anion gap: 7 (ref 5–15)
BUN: 15 mg/dL (ref 8–23)
CO2: 23 mmol/L (ref 22–32)
Calcium: 8 mg/dL — ABNORMAL LOW (ref 8.9–10.3)
Chloride: 102 mmol/L (ref 98–111)
Creatinine, Ser: 0.69 mg/dL (ref 0.44–1.00)
GFR, Estimated: >60 mL/min (ref >60)
Glucose, Bld: 152 mg/dL — ABNORMAL HIGH (ref 70–99)
Potassium: 4 mmol/L (ref 3.5–5.1)
Sodium: 132 mmol/L — ABNORMAL LOW (ref 135–145)

## 2022-09-11 MED ORDER — DILTIAZEM HCL ER COATED BEADS 120 MG PO CP24
120.0000 mg | ORAL_CAPSULE | Freq: Every day | ORAL | Status: DC
  Administered 2022-09-11 – 2022-09-14 (×4): 120 mg via ORAL
  Filled 2022-09-11 (×4): qty 1

## 2022-09-11 MED ORDER — AMIODARONE HCL 200 MG PO TABS
200.0000 mg | ORAL_TABLET | Freq: Two times a day (BID) | ORAL | Status: DC
  Administered 2022-09-11 – 2022-09-14 (×8): 200 mg via ORAL
  Filled 2022-09-11 (×8): qty 1

## 2022-09-11 MED ORDER — AMIODARONE HCL 200 MG PO TABS: 200.0000 mg | ORAL_TABLET | Freq: Every day | ORAL | Status: DC

## 2022-09-11 MED ORDER — SODIUM CHLORIDE 0.9% FLUSH
10.0000 mL | Freq: Two times a day (BID) | INTRAVENOUS | Status: DC
  Administered 2022-09-11 – 2022-09-13 (×5): 10 mL
  Administered 2022-09-14: 20 mL
  Administered 2022-09-14: 10 mL

## 2022-09-11 MED ORDER — SODIUM CHLORIDE 0.9% FLUSH: 10.0000 mL | INTRAVENOUS | Status: DC | PRN

## 2022-09-11 MED ORDER — POLYETHYLENE GLYCOL 3350 17 G PO PACK
17.0000 g | PACK | Freq: Two times a day (BID) | ORAL | Status: DC
  Administered 2022-09-11 – 2022-09-14 (×3): 17 g via ORAL
  Filled 2022-09-11 (×4): qty 1

## 2022-09-11 NOTE — Progress Notes (Signed)
PT Cancellation Note  Patient Details Name: Weldon Sohmer MRN: [id number] DOB: 09-Mar-1951   Cancelled Treatment:    Reason Eval/Treat Not Completed: Patient at procedure or test/unavailable, going to radiation. Will check back another time. [facility] [phone] Weekend pager-[phone]    Claretha Cooper 09/11/2022, 12:17 PM

## 2022-09-11 NOTE — Progress Notes (Signed)
PROGRESS NOTE    Rhonda Blackwell  [id number] DOB: 12/16/1950 DOA: 08/29/2022 PCP: [name], MD   Brief Narrative: 72 year old with past medical history significant for COPD, diabetes type 2, CVA, hypertension, A-fib on Eliquis, metastatic disease initially with unknown primary presented to hospital with worsening shortness of breath.  Of note recent admission from 07/23/2022 1 to 07/27/2022 with sepsis secondary to pneumonia multifocal.  Discharged on 2 L of oxygen.  She was also recently diagnosed with A-fib.  Since her diagnosis she has been having shortness of breath and not feeling well.  She has a history of hypermetabolic left upper lobe nodule as well as hypermetabolic hilar and mediastinal lymph nodes, left scapular lesion, area of right acetabulum and rectum concerning for metastatic disease on PET scan 04/2022.  She was followed by [facility] pulmonology/oncology, underwent biopsy of left scapular lesion 06/08/2022 and bronchoscopy EBUS on 07/04/2022, she did not follow-up with oncology and multiple appointments.  She presented to ED and was found to be A-fib RVR.  Developed worsening hypoxia requiring up to 8 L of high flow oxygen.  CT chest showed masslike consolidation in the left upper lobe abutting the left hilar concerning for central obstructing neoplasm. peripheral areas of consolidation and interlobar septal thickening throughout the left upper lobe concerning for combination of postobstructive changes, regional metastatic disease and lymphangitic spread of tumor. Mediastinal and hilar adenopathy consistent with nodule metastasis. 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   Patient underwent thoracentesis and subsequently chest  tube placement left pleural space on 3/19.  She is undergoing radiation therapy and has been a started on chemotherapy first dose 3/20. Chest tube  was removed 3/21 after chemical pleurodesis.  Underwent flexible sigmoidoscopy on 3/22 due to abnormal rectal area on Pet scan which was normal.   Assessment & Plan:   Principal Problem:   Acute on chronic respiratory failure with hypoxia (HCC) Active Problems:   Type 2 diabetes mellitus without complications (HCC)   Essential hypertension   Hyponatremia   Paroxysmal atrial fibrillation with RVR (HCC)   History of CVA (cerebrovascular accident)   Chronic anxiety   Tobacco use   Malignant neoplasm of hilus of left lung (HCC)   Lung mass   Acute CVA (cerebrovascular accident) ([place])   Pleural effusion on left   Lung cancer, primary, with metastasis from lung to other site, left (HCC)   Hypocalcemia   Hypophosphatemia  1-Acute on Chronic Hypoxic Respiratory failure secondary to Left-sided Pleural Effusion, Lung mass: Flash Pulmonary edema, lung reexpansion.  -Acute on chronic respiratory failure likely multifactorial secondary to lung mass, postobstructive pneumonia, large left pleural effusion. -Patient oxygen requirement increased up to 15 L.  CCM consulted and underwent thoracentesis 3/17 yielding 800 cc of fluids. -CT chest showed masslike consolidation left upper lobe abutting left hilar concerning for central obstructing neoplasm. -On IV antibiotics to cover for postobstructive pneumonia. Day 10 of antibiotics.  -Oncology following and concern is for adenocarcinoma of the lung. -Chest x-ray showed reaccumulation of pleural fluid.  Underwent left side pigtail catheter placement by CCM on 3/19. Underwent Chemical Pleurodesis via chest tube 3/20. -Continue with IV lasix.  -Chest tube removed 3/21. Stable to 3 L oxygen.  Repeated chest x ray 3/23; appears to have re- accumulation of fluid. CCM consulted.  Chest x ray 3/25; improved aeration left lung.   2-Metastatic cancer suspect primary Lung. Lung Mass. Left Pleural; effusion.  -Bone scan: Multifocal skeletal metastases  involving the spine, ribs, left scapula and minimal  involvement of the left hemipelvis. -CT head, neck: Multiple calvarial metastasis, largest located in the right parietal bone with transcortical extension. -Patient evaluated by radiation oncology, patient was a started on radiation treatments.  She will need to complete 2-week course of treatment.  -Dr. Marin Olp following. -Port cath placement 3/18 by IR -Started  chemotherapy 3/20. She will need Chemo on 3/27 -Pleural fluid cytology: Malignant cell presents, poorly differentiated carcinoma. Likely adenocarcinoma.   3-Hypokalemia; Replaced.   4-Small acute infarct left frontal white matter, prior recent history of CVA -MRI done on 09/02/2022 for staging show a small acute infarct in the left frontal white matter.  Chronic small vessel ischemia with multiple chronic lacunar infarct.  Multiple calvarial metastasis. -CT angio head and neck no large vessel occlusion. -Had a recent 2D echo.  This has not been repeated. -LDL: 51.  A1c 6.3. -Continue with the statins. -Transition to Eliquis.   5-A-fib RVR:  Intermittent episode of A-fib RVR.  Intermittently on Cardizem drip. On oral Labetalol.  3-23: HR jump to 180  A fib-- BP 100. 5 Mg IV metoprolol. HR down to 120--130. SBP 112.  Started IV amiodarone, now in sinus rhythm.  On Cardizem 30 mg every 6 hours.  On Eliquis.  Appreciate cardiology assistance.  Transition to oral Amiodarone 3/25. Needs dose reduction in 2 weeks.   6-Hypomagnesemia, hypophosphatemia and hypocalcemia Replaced.   Hypertension: On Cardizem, Avapro, labetalol.   Tobacco use: Cessation encouraged  Diabetes type 2: A1c 6.3 Sliding scale insulin  Chronic anxiety: Continue with Cymbalta.  Hypermetabolic activity in the rectum seen on PET scan: CEA elevated 1400 Underwent sigmoidoscopy: negative.   Hyponatremia: Continue with IV Lasix     Estimated body mass index is 25.16 kg/m as calculated from the following:   Height as of this encounter: 5\' 4"  (1.626  m).   Weight as of this encounter: 66.5 kg.   DVT prophylaxis: Heparin  Code Status: Full code Family Communication: Care discussed with patient. Daughter at bedside.  Disposition Plan:  Status is: Inpatient Remains inpatient appropriate because: management of metastatic cancer, malignancy     Consultants:  Radiation oncology Dr Marin Olp CCM Dr Benson Norway , GI Neurology   Procedures:  CT chest 08/29/2022 Chest x-ray 08/29/2022, 09/03/2022 MRI brain 09/02/2022 Bone scan 09/01/2022 Thoracentesis per PCCM, Dr.Olalere 09/03/2022 Port-A-Cath placement 09/04/2022  Antimicrobials:  IV Unasyn   Subjective: She is alert, breathing better. No BM yet since sigmoidoscopy    Objective: Vitals:   09/11/22 0100 09/11/22 0500 09/11/22 0600 09/11/22 0700  BP: 126/66 (!) 142/68 (!) 145/61 (!) 137/57  Pulse: 75 78 76 75  Resp: (!) 23 (!) 23 19 20   Temp:      TempSrc:      SpO2: 96% 90% 98% 97%  Weight:      Height:        Intake/Output Summary (Last 24 hours) at 09/11/2022 0819 Last data filed at 09/11/2022 0600 Gross per 24 hour  Intake 1237.16 ml  Output 900 ml  Net 337.16 ml    Filed Weights   09/02/22 0526 09/03/22 1900 09/04/22 0600  Weight: 65.4 kg 65.4 kg 66.5 kg    Examination:  General exam: NAD Respiratory system: BL air movement.  Cardiovascular system: S 1, S 2 RRR Gastrointestinal system: BS present, soft, nt Central nervous system: Alert, follows command Extremities: No edema    Data Reviewed: I have personally reviewed following labs and imaging studies  CBC: Recent Labs  Lab 09/06/22 0650 09/07/22 0300 09/08/22 0347 09/09/22 0519 09/10/22 0736  WBC 7.8 7.8 9.6 8.9 10.3  HGB 9.2* 9.5* 9.8* 10.3* 10.2*  HCT 29.3* 29.9* 31.3* 33.1* 31.9*  MCV 80.3 79.9* 78.8* 79.2* 78.2*  PLT 380 407* 434* 449* 471*    Basic Metabolic Panel: Recent Labs  Lab 09/05/22 0313 09/05/22 0839 09/06/22 0650 09/07/22 0929 09/08/22 0347 09/09/22 0822 09/10/22 0736   NA 127*  --  132* 135 135 132* 131*  K 3.1*  --  4.2 3.5 4.0 4.0 3.8  CL 98  --  103 104 104 102 100  CO2 23  --  21* 23 21* 21* 21*  GLUCOSE 135*  --  131* 131* 131* 153* 162*  BUN 18  --  13 12 13 14 16   CREATININE 0.81  --  0.79 0.70 0.74 0.64 0.74  CALCIUM 6.9*   < > 7.2* 7.1* 7.0* 7.5* 7.8*  MG 2.3  --  2.2 2.2  --  2.2  --   PHOS 2.2*  --  2.6  --   --   --   --    < > = values in this interval not displayed.    GFR: Estimated Creatinine Clearance: 60.5 mL/min (by C-G formula based on SCr of 0.74 mg/dL). Liver Function Tests: Recent Labs  Lab 09/05/22 0313 09/06/22 0650 09/10/22 0736  AST 18 15 16   ALT 14 14 18   ALKPHOS 97 88 86  BILITOT 0.7 0.5 0.5  PROT 5.9* 6.2* 6.6  ALBUMIN 2.5* 2.4* 2.6*    No results for input(s): "LIPASE", "AMYLASE" in the last 168 hours. No results for input(s): "AMMONIA" in the last 168 hours. Coagulation Profile: No results for input(s): "INR", "PROTIME" in the last 168 hours. Cardiac Enzymes: No results for input(s): "CKTOTAL", "CKMB", "CKMBINDEX", "TROPONINI" in the last 168 hours. BNP (last 3 results) No results for input(s): "PROBNP" in the last 8760 hours. HbA1C: No results for input(s): "HGBA1C" in the last 72 hours. CBG: Recent Labs  Lab 09/10/22 0750 09/10/22 1134 09/10/22 1438 09/10/22 1717 09/10/22 2127  GLUCAP 174* 176* 166* 187* 167*    Lipid Profile: No results for input(s): "CHOL", "HDL", "LDLCALC", "TRIG", "CHOLHDL", "LDLDIRECT" in the last 72 hours. Thyroid Function Tests: No results for input(s): "TSH", "T4TOTAL", "FREET4", "T3FREE", "THYROIDAB" in the last 72 hours.  Anemia Panel: No results for input(s): "VITAMINB12", "FOLATE", "FERRITIN", "TIBC", "IRON", "RETICCTPCT" in the last 72 hours. Sepsis Labs: No results for input(s): "PROCALCITON", "LATICACIDVEN" in the last 168 hours.   Recent Results (from the past 240 hour(s))  Body fluid culture w Gram Stain     Status: None   Collection Time: 09/03/22   3:03 PM   Specimen: Pleural Fluid  Result Value Ref Range Status   Specimen Description   Final    PLEURAL Performed at [facility] [address], [place], [place] [address]    Special Requests   Final    NONE Performed at [facility], [place] [address] [place], [place] [address]    Gram Stain   Final    RARE WBC PRESENT, PREDOMINANTLY PMN NO ORGANISMS SEEN    Culture   Final    NO GROWTH 3 DAYS Performed at [facility] [address] [place],  [address]    Report Status 09/06/2022 FINAL  Final  MRSA Next Gen by PCR, Nasal     Status: None   Collection Time:  09/03/22  7:39 PM   Specimen: Nasal Mucosa; Nasal Swab  Result Value Ref Range Status   MRSA by PCR Next Gen NOT DETECTED NOT DETECTED Final    Comment: (NOTE) The GeneXpert MRSA Assay (FDA approved for NASAL specimens only), is one component of a comprehensive MRSA colonization surveillance program. It is not intended to diagnose MRSA infection nor to guide or monitor treatment for MRSA infections. Test performance is not FDA approved in patients less than 31 years old. Performed at [facility], [place] [address], [place], [place] [address]          Radiology Studies: DG CHEST PORT 1 VIEW  Result Date: 09/11/2022 CLINICAL DATA:  Hypoxia EXAM: PORTABLE CHEST 1 VIEW COMPARISON:  Previous studies including the examination done on 09/09/2022 FINDINGS: Transverse diameter of heart is increased. Central pulmonary vessels are less prominent. Increased density is seen in left mid and left lower lung fields. This may be caused by left pleural effusion and underlying infiltrates. There is improvement in aeration in left mid and lower lung fields. There is moderate sized left pleural effusion. Right lung is clear. Right lateral CP angle is clear. There is no pneumothorax. Tip of right IJ chest port is seen in superior vena cava. IMPRESSION: There is interval  improvement in aeration in left lung suggesting decrease in left pleural effusion and some improvement in underlying infiltrates. Electronically Signed   By: Elmer Picker M.D.   On: 09/11/2022 08:06   DG Chest 2 View  Result Date: 09/09/2022 CLINICAL DATA:  Show this of breath, history of pleural effusion and prior thoracentesis EXAM: CHEST - 2 VIEW COMPARISON:  09/07/2022 FINDINGS: Mild volume loss no LEFT hemithorax with mediastinal shift to the LEFT. RIGHT jugular Port-A-Cath with tip projecting over SVC. Atherosclerotic calcification aorta. Heart obscured by moderate pleural effusion, LEFT basilar atelectasis, and LEFT lung infiltrate. Previously seen pigtail catheter no longer identified. RIGHT lung clear. No pneumothorax. IMPRESSION: Increased LEFT pleural effusion and LEFT basilar atelectasis with persistent LEFT lung infiltrate. Aortic Atherosclerosis (ICD10-I70.0). Electronically Signed   By: Lavonia Dana M.D.   On: 09/09/2022 14:55        Scheduled Meds:  apixaban  5 mg Oral BID   atorvastatin  40 mg Oral Daily   Chlorhexidine Gluconate Cloth  6 each Topical Daily   diltiazem  30 mg Oral Q6H   dronabinol  2.5 mg Oral BID AC   DULoxetine  60 mg Oral BID   feeding supplement  237 mL Oral BID BM   fluticasone  2 spray Each Nare Daily   furosemide  40 mg Intravenous Daily   guaiFENesin  600 mg Oral BID   insulin aspart  0-6 Units Subcutaneous TID WC   labetalol  100 mg Oral BID   magnesium oxide  400 mg Oral BID   mometasone-formoterol  2 puff Inhalation BID   pantoprazole  40 mg Oral Daily   polyethylene glycol  17 g Oral BID   senna-docusate  1 tablet Oral BID   Continuous Infusions:  sodium chloride 10 mL/hr at 09/11/22 0600   sodium chloride Stopped (09/10/22 1844)   amiodarone 30 mg/hr (09/11/22 0600)   ampicillin-sulbactam (UNASYN) IV Stopped (09/11/22 0554)     LOS: 13 days    Time spent: 35 minutes    Shawnique Mariotti A Keoki Mchargue, MD [facility]   If  7PM-7AM, please contact night-coverage [email or web address]  09/11/2022, 8:19 AM

## 2022-09-11 NOTE — Progress Notes (Signed)
OT Cancellation Note  Patient Details Name: Rhonda Blackwell MRN: [id number] DOB: 09-18-1950   Cancelled Treatment:    Reason Eval/Treat Not Completed: Patient at procedure or test/ unavailable Patient is off hall at chemo. OT to continue to follow and check back as schedule will allow.  Rennie Plowman, MS Acute Rehabilitation Department Office# [phone]  09/11/2022, 11:13 AM

## 2022-09-11 NOTE — Progress Notes (Addendum)
Rounding Note    Patient Name: Rhonda Blackwell Date of Encounter: 09/11/2022  [facility] Cardiologist: Percival Spanish -- she would like to follow up in [place]   Patient maintaining NSR with HR in the 70s. Denies chest pain, palpitations, shortness of breath.   Inpatient Medications    Scheduled Meds:  apixaban  5 mg Oral BID   atorvastatin  40 mg Oral Daily   Chlorhexidine Gluconate Cloth  6 each Topical Daily   diltiazem  30 mg Oral Q6H   dronabinol  2.5 mg Oral BID AC   DULoxetine  60 mg Oral BID   feeding supplement  237 mL Oral BID BM   fluticasone  2 spray Each Nare Daily   furosemide  40 mg Intravenous Daily   guaiFENesin  600 mg Oral BID   insulin aspart  0-6 Units Subcutaneous TID WC   labetalol  100 mg Oral BID   magnesium oxide  400 mg Oral BID   mometasone-formoterol  2 puff Inhalation BID   pantoprazole  40 mg Oral Daily   polyethylene glycol  17 g Oral Daily   senna-docusate  1 tablet Oral BID   Continuous Infusions:  sodium chloride 10 mL/hr at 09/11/22 0600   sodium chloride Stopped (09/10/22 1844)   amiodarone 30 mg/hr (09/11/22 0600)   ampicillin-sulbactam (UNASYN) IV Stopped (09/11/22 0554)   PRN Meds: sodium chloride, sodium chloride, acetaminophen, albuterol, bisacodyl, guaiFENesin-dextromethorphan, metoprolol tartrate, morphine injection, mouth rinse   Vital Signs    Vitals:   09/11/22 0100 09/11/22 0500 09/11/22 0600 09/11/22 0700  BP: 126/66 (!) 142/68 (!) 145/61 (!) 137/57  Pulse: 75 78 76 75  Resp: (!) 23 (!) 23 19 20   Temp:      TempSrc:      SpO2: 96% 90% 98% 97%  Weight:      Height:        Intake/Output Summary (Last 24 hours) at 09/11/2022 0754 Last data filed at 09/11/2022 0600 Gross per 24 hour  Intake 1505.63 ml  Output 900 ml  Net 605.63 ml      09/04/2022    6:00 AM 09/03/2022    7:00 PM 09/02/2022    5:26 AM  Last 3 Weights  Weight (lbs) 146 lb 9.7 oz 144 lb 2.9 oz 144 lb 2.9 oz  Weight (kg)  66.5 kg 65.4 kg 65.4 kg      Telemetry    NSR, HR in the 70s - Personally Reviewed  ECG    No new tracings since 3/23 - Personally Reviewed  Physical Exam   GEN: No acute distress. Sitting upright in the bed.  Neck: No JVD Cardiac: RRR, no murmurs, rubs, or gallops. Radial pulses 2+ bilaterally  Respiratory: Clear to auscultation bilaterally. Normal work of breathing on room air  GI: Soft, nontender MS: No edema in BLE; No deformity. Neuro:  Nonfocal  Psych: Normal affect   Labs    High Sensitivity Troponin:   Recent Labs  Lab 08/29/22 1400 08/29/22 1700  TROPONINIHS 30* 79*     Chemistry Recent Labs  Lab 09/05/22 0313 09/05/22 0839 09/06/22 0650 09/07/22 0929 09/08/22 0347 09/09/22 0822 09/10/22 0736  NA 127*  --  132* 135 135 132* 131*  K 3.1*  --  4.2 3.5 4.0 4.0 3.8  CL 98  --  103 104 104 102 100  CO2 23  --  21* 23 21* 21* 21*  GLUCOSE 135*  --  131* 131* 131*  153* 162*  BUN 18  --  13 12 13 14 16   CREATININE 0.81  --  0.79 0.70 0.74 0.64 0.74  CALCIUM 6.9*   < > 7.2* 7.1* 7.0* 7.5* 7.8*  MG 2.3  --  2.2 2.2  --  2.2  --   PROT 5.9*  --  6.2*  --   --   --  6.6  ALBUMIN 2.5*  --  2.4*  --   --   --  2.6*  AST 18  --  15  --   --   --  16  ALT 14  --  14  --   --   --  18  ALKPHOS 97  --  88  --   --   --  86  BILITOT 0.7  --  0.5  --   --   --  0.5  GFRNONAA >60  --  >60 >60 >60 >60 >60  ANIONGAP 6  --  8 8 10 9 10    < > = values in this interval not displayed.    Lipids No results for input(s): "CHOL", "TRIG", "HDL", "LABVLDL", "LDLCALC", "CHOLHDL" in the last 168 hours.  Hematology Recent Labs  Lab 09/08/22 0347 09/09/22 0519 09/10/22 0736  WBC 9.6 8.9 10.3  RBC 3.97 4.18 4.08  HGB 9.8* 10.3* 10.2*  HCT 31.3* 33.1* 31.9*  MCV 78.8* 79.2* 78.2*  MCH 24.7* 24.6* 25.0*  MCHC 31.3 31.1 32.0  RDW 14.1 14.3 14.4  PLT 434* 449* 471*   Thyroid  Recent Labs  Lab 09/06/22 0910  TSH 1.644    BNPNo results for input(s): "BNP", "PROBNP" in  the last 168 hours.  DDimer No results for input(s): "DDIMER" in the last 168 hours.   Radiology    DG Chest 2 View  Result Date: 09/09/2022 CLINICAL DATA:  Show this of breath, history of pleural effusion and prior thoracentesis EXAM: CHEST - 2 VIEW COMPARISON:  09/07/2022 FINDINGS: Mild volume loss no LEFT hemithorax with mediastinal shift to the LEFT. RIGHT jugular Port-A-Cath with tip projecting over SVC. Atherosclerotic calcification aorta. Heart obscured by moderate pleural effusion, LEFT basilar atelectasis, and LEFT lung infiltrate. Previously seen pigtail catheter no longer identified. RIGHT lung clear. No pneumothorax. IMPRESSION: Increased LEFT pleural effusion and LEFT basilar atelectasis with persistent LEFT lung infiltrate. Aortic Atherosclerosis (ICD10-I70.0). Electronically Signed   By: Lavonia Dana M.D.   On: 09/09/2022 14:55    Cardiac Studies   Echocardiogram 07/27/22  1. Left ventricular ejection fraction, by estimation, is >75%. The left  ventricle has hyperdynamic function. The left ventricle has no regional  wall motion abnormalities. There is mild concentric left ventricular  hypertrophy. Left ventricular diastolic  parameters are consistent with Grade I diastolic dysfunction (impaired  relaxation).   2. Right ventricular systolic function is normal. The right ventricular  size is normal.   3. The mitral valve is normal in structure. Trivial mitral valve  regurgitation. No evidence of mitral stenosis.   4. The aortic valve is tricuspid. Aortic valve regurgitation is not  visualized. No aortic stenosis is present.   5. The inferior vena cava is normal in size with greater than 50%  respiratory variability, suggesting right atrial pressure of 3 mmHg.    Patient Profile     72 y.o. female with a past medical history of difficult to control HTN who is followed by [facility] cardiology. Being seen for atrial fibrillation. Past medical history also significant for metastatic  lung  cancer, COPD, DM.   Assessment & Plan    Paroxysmal Atrial Fibrillation  - Patient was diagnosed with atrial fibrillation in 07/2022. Echocardiogram at that time showed EF >75% with mild LVH. Patient was treated with eliquis and diltiazem.  - She presented to the ED complaining of shortness of breath and was found to be in afib with RVR. She had run out of her eliquis at home - Of note, afib with RVR had occurred in the setting of acute respiratory failure secondary to left-sided pleural effusion, lung mass, flash pulmonary edema. Patient has since undergone thoracentesis, pigtail catheter placement, and chemical pleurodesis - She was started on IV amiodarone on 3/23. Converted to NSR. Continued IV amiodarone through 3/24  - Start PO amiodarone 200 mg BID today. Continue this dose for 2 weeks, then decrease to 200 mg daily. She will need to be followed by cardiology after discharge. May not need amiodarone long term depending on her response to cancer therapies  - Consolidate diltiazem 30 mg q6hrs to long acting 120 mg daily  - Continue eliquis 5 mg BID   HTN  - As above, consolidate to cardizem CD 120 mg daily - Continue labetalol 100 mg BID   Otherwise per primary  - Metastatic cancer, suspected primary lung/adenocarcinoma  - Left Pleural effusion-- underwent thoracentesis on 3/17 and pigtail catheter placement by ccm on 3/19. Later underwent chemical pleurodesis via chest tube on 3/20.  Malignant cells were present in pleural fluid cytology  - Small acute infarct left frontal white matter. Recent history of CVA  - DM - Anxiety   For questions or updates, please contact [facility] Please consult [email or web address] for contact info under        Signed, Margie Billet, PA-C  09/11/2022, 7:54 AM     Attending Note:   The patient was seen and examined.  Agree with assessment and plan as noted above.  Changes made to the above note as needed.  Patient seen and  independently examined with Vikki Ports, PA .   We discussed all aspects of the encounter. I agree with the assessment and plan as stated above.    Atrial fib:  has converted to NSR on amio.   Have changed the IV amio to PO amio.  Cont loading dose.  Reduce dose in 2 weeks.    Continue diltiazem for rate control . Labetalol   2. HTN;  cont dilt, Labetalol   3.  Metastatic lung cancer - plans per oncology     I have spent a total of 40 minutes with patient reviewing hospital  notes , telemetry, EKGs, labs and examining patient as well as establishing an assessment and plan that was discussed with the patient.  > 50% of time was spent in direct patient care.    Thayer Headings, Brooke Bonito., MD, [facility] 09/11/2022, 12:15 PM [address],  [place] Pager [phone]

## 2022-09-12 ENCOUNTER — Ambulatory Visit
Admission: RE | Admit: 2022-09-12 | Discharge: 2022-09-12 | Disposition: A | Discharge status: Home / Self Care | Payer: Medicare Other | Source: Ambulatory Visit | Attending: Radiation Oncology | Admitting: Radiation Oncology

## 2022-09-12 ENCOUNTER — Ambulatory Visit: Payer: Medicare Other

## 2022-09-12 ENCOUNTER — Other Ambulatory Visit: Payer: Self-pay

## 2022-09-12 DIAGNOSIS — I4891 Unspecified atrial fibrillation: Secondary | ICD-10-CM

## 2022-09-12 DIAGNOSIS — C3490 Malignant neoplasm of unspecified part of unspecified bronchus or lung: Secondary | ICD-10-CM

## 2022-09-12 DIAGNOSIS — Z515 Encounter for palliative care: Secondary | ICD-10-CM | POA: Diagnosis not present

## 2022-09-12 DIAGNOSIS — I639 Cerebral infarction, unspecified: Secondary | ICD-10-CM | POA: Diagnosis not present

## 2022-09-12 DIAGNOSIS — J9621 Acute and chronic respiratory failure with hypoxia: Secondary | ICD-10-CM | POA: Diagnosis not present

## 2022-09-12 DIAGNOSIS — Z7189 Other specified counseling: Secondary | ICD-10-CM

## 2022-09-12 LAB — RAD ONC ARIA SESSION SUMMARY
Course Elapsed Days: 11
Plan Fractions Treated to Date: 7
Plan Prescribed Dose Per Fraction: 3 Gy
Plan Total Fractions Prescribed: 8
Plan Total Prescribed Dose: 24 Gy
Reference Point Dosage Given to Date: 21 Gy
Reference Point Session Dosage Given: 3 Gy
Session Number: 8

## 2022-09-12 LAB — GLUCOSE, CAPILLARY
Glucose-Capillary: 157 mg/dL — ABNORMAL HIGH (ref 70–99)
Glucose-Capillary: 120 mg/dL — ABNORMAL HIGH (ref 70–99)
Glucose-Capillary: 135 mg/dL — ABNORMAL HIGH (ref 70–99)
Glucose-Capillary: 223 mg/dL — ABNORMAL HIGH (ref 70–99)

## 2022-09-12 NOTE — Progress Notes (Signed)
PROGRESS NOTE    Rhonda Blackwell  [id number] DOB: [birth year] DOA: 08/29/2022 PCP: [name], MD   Brief Narrative: 72 year old with past medical history significant for COPD, diabetes type 2, CVA, hypertension, A-fib on Eliquis, metastatic disease initially with unknown primary presented to hospital with worsening shortness of breath.  Of note recent admission from 07/23/2022 1 to 07/27/2022 with sepsis secondary to pneumonia multifocal.  Discharged on 2 L of oxygen.  She was also recently diagnosed with A-fib.  Since her diagnosis she has been having shortness of breath and not feeling well.  She has a history of hypermetabolic left upper lobe nodule as well as hypermetabolic hilar and mediastinal lymph nodes, left scapular lesion, area of right acetabulum and rectum concerning for metastatic disease on PET scan 04/2022.  She was followed by [facility] pulmonology/oncology, underwent biopsy of left scapular lesion 06/08/2022 and bronchoscopy EBUS on 07/04/2022, she did not follow-up with oncology and multiple appointments.  She presented to ED and was found to be A-fib RVR.  Developed worsening hypoxia requiring up to 8 L of high flow oxygen.  CT chest showed masslike consolidation in the left upper lobe abutting the left hilar concerning for central obstructing neoplasm. peripheral areas of consolidation and interlobar septal thickening throughout the left upper lobe concerning for combination of postobstructive changes, regional metastatic disease and lymphangitic spread of tumor. Mediastinal and hilar adenopathy consistent with nodule metastasis. 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   Patient underwent thoracentesis and subsequently chest  tube placement left pleural space on 3/19.  She is undergoing radiation therapy and has been a started on chemotherapy first dose 3/20. Chest tube  was removed 3/21 after chemical pleurodesis.  Underwent flexible sigmoidoscopy on 3/22 due to abnormal rectal area on Pet scan which was normal.   Plan for chemotherapy 3/27. Monitor A fib on oral medications. She had episode of A fib RVR the night of 3/25.  Assessment & Plan:   Principal Problem:   Acute on chronic respiratory failure with hypoxia (HCC) Active Problems:   Type 2 diabetes mellitus without complications (HCC)   Essential hypertension   Hyponatremia   Paroxysmal atrial fibrillation (HCC)   History of CVA (cerebrovascular accident)   Chronic anxiety   Tobacco use   Malignant neoplasm of hilus of left lung (HCC)   Lung mass   Acute CVA (cerebrovascular accident) ([place])   Pleural effusion on left   Lung cancer, primary, with metastasis from lung to other site, left [facility])   Hypocalcemia   Hypophosphatemia   Palliative care encounter   Atrial fibrillation with RVR ([place])   Goals of care, counseling/discussion   Counseling and coordination of care  1-Acute on Chronic Hypoxic Respiratory failure secondary to Left-sided Pleural Effusion, Lung mass: Flash Pulmonary edema, lung reexpansion.  -Acute on chronic respiratory failure likely multifactorial secondary to lung mass, postobstructive pneumonia, large left pleural effusion. -Patient oxygen requirement increased up to 15 L.  CCM consulted and underwent thoracentesis 3/17 yielding 800 cc of fluids. -CT chest showed masslike consolidation left upper lobe abutting left hilar concerning for central obstructing neoplasm. -On IV antibiotics to cover for postobstructive pneumonia. Day 12 of antibiotics.  -Oncology following and concern is for adenocarcinoma of the lung. -Chest x-ray showed reaccumulation of pleural fluid.  Underwent left side pigtail catheter placement by CCM on 3/19. Underwent Chemical Pleurodesis via chest tube 3/20. -Continue with IV lasix.  -Chest tube removed 3/21. Stable to  3 L oxygen.  Repeated chest x ray 3/23; appears to have re- accumulation of  fluid. CCM consulted.  Chest x ray 3/25; improved aeration left lung.   2-Metastatic cancer suspect primary Lung. Lung Mass. Left Pleural; effusion.  -Bone scan: Multifocal skeletal metastases involving the spine, ribs, left scapula and minimal involvement of the left hemipelvis. -CT head, neck: Multiple calvarial metastasis, largest located in the right parietal bone with transcortical extension. -Patient evaluated by radiation oncology, patient was a started on radiation treatments.  She will need to complete 2-week course of treatment.  -Dr. Marin Olp following. -Port cath placement 3/18 by IR -Started  chemotherapy 3/20. She will need Chemo on 3/27 -Pleural fluid cytology: Malignant cell presents, poorly differentiated carcinoma. Likely adenocarcinoma.  -Palliative care consulted for support and discussion of goals of care, Code status.   3-Hypokalemia; Replaced.   4-Small acute infarct left frontal white matter, prior recent history of CVA -MRI done on 09/02/2022 for staging show a small acute infarct in the left frontal white matter.  Chronic small vessel ischemia with multiple chronic lacunar infarct.  Multiple calvarial metastasis. -CT angio head and neck no large vessel occlusion. -Had a recent 2D echo.  This has not been repeated. -LDL: 51.  A1c 6.3. -Continue with the statins. -Transition to Eliquis.   5-A-fib RVR:  Intermittent episode of A-fib RVR.  Intermittently on Cardizem drip. On oral Labetalol.  3-23: HR jump to 180  A fib-- BP 100. 5 Mg IV metoprolol. HR down to 120--130. SBP 112.  Treated with IV amiodarone, transition to oral.  On Cardizem 120 mg daily  On Eliquis.  Appreciate cardiology assistance.  Transition to oral Amiodarone 3/25. Needs dose reduction in 2 weeks.  Had episode of A fib RVR 3/25, responded to IV metoprolol.  Monitor.   6-Hypomagnesemia, hypophosphatemia and hypocalcemia Replaced.   Hypertension: On Cardizem, Avapro, labetalol.   Tobacco  use: Cessation encouraged  Diabetes type 2: A1c 6.3 Sliding scale insulin  Chronic anxiety: Continue with Cymbalta.  Hypermetabolic activity in the rectum seen on PET scan: CEA elevated 1400 Underwent sigmoidoscopy: negative.   Hyponatremia: Continue with IV Lasix     Estimated body mass index is 25.16 kg/m as calculated from the following:   Height as of this encounter: 5\' 4"  (1.626 m).   Weight as of this encounter: 66.5 kg.   DVT prophylaxis: Heparin  Code Status: Full code Family Communication: Care discussed with patient. Daughter at bedside.  Disposition Plan:  Status is: Inpatient Remains inpatient appropriate because: management of metastatic cancer, malignancy     Consultants:  Radiation oncology Dr Marin Olp CCM Dr Benson Norway , GI Neurology   Procedures:  CT chest 08/29/2022 Chest x-ray 08/29/2022, 09/03/2022 MRI brain 09/02/2022 Bone scan 09/01/2022 Thoracentesis per PCCM, Dr.Olalere 09/03/2022 Port-A-Cath placement 09/04/2022  Antimicrobials:  IV Unasyn   Subjective: She is off oxygen this am. She appears clinically improved. She is in good spirit.  She had BM   Objective: Vitals:   09/12/22 1000 09/12/22 1024 09/12/22 1100 09/12/22 1155  BP:  139/60 (!) 121/57   Pulse: 82 82 75   Resp:      Temp:    98.9 F (37.2 C)  TempSrc:    Oral  SpO2: 92% 90% 91%   Weight:      Height:        Intake/Output Summary (Last 24 hours) at 09/12/2022 1405 Last data filed at 09/12/2022 1349 Gross per 24 hour  Intake 549.55 ml  Output  950 ml  Net -400.45 ml    Filed Weights   09/02/22 0526 09/03/22 1900 09/04/22 0600  Weight: 65.4 kg 65.4 kg 66.5 kg    Examination:  General exam: NAD Respiratory system: BL air movement. No wheezing.  Cardiovascular system: S 1, S 2 RRR Gastrointestinal system: BS present, soft, nt, nd Central nervous system: Alert, follows command Extremities: No edema    Data Reviewed: I have personally reviewed following labs and  imaging studies  CBC: Recent Labs  Lab 09/07/22 0300 09/08/22 0347 09/09/22 0519 09/10/22 0736 09/11/22 0800  WBC 7.8 9.6 8.9 10.3 8.9  HGB 9.5* 9.8* 10.3* 10.2* 10.2*  HCT 29.9* 31.3* 33.1* 31.9* 32.3*  MCV 79.9* 78.8* 79.2* 78.2* 78.4*  PLT 407* 434* 449* 471* 519*    Basic Metabolic Panel: Recent Labs  Lab 09/06/22 0650 09/07/22 0929 09/08/22 0347 09/09/22 0822 09/10/22 0736 09/11/22 0800  NA 132* 135 135 132* 131* 132*  K 4.2 3.5 4.0 4.0 3.8 4.0  CL 103 104 104 102 100 102  CO2 21* 23 21* 21* 21* 23  GLUCOSE 131* 131* 131* 153* 162* 152*  BUN 13 12 13 14 16 15   CREATININE 0.79 0.70 0.74 0.64 0.74 0.69  CALCIUM 7.2* 7.1* 7.0* 7.5* 7.8* 8.0*  MG 2.2 2.2  --  2.2  --   --   PHOS 2.6  --   --   --   --   --     GFR: Estimated Creatinine Clearance: 60.5 mL/min (by C-G formula based on SCr of 0.69 mg/dL). Liver Function Tests: Recent Labs  Lab 09/06/22 0650 09/10/22 0736  AST 15 16  ALT 14 18  ALKPHOS 88 86  BILITOT 0.5 0.5  PROT 6.2* 6.6  ALBUMIN 2.4* 2.6*    No results for input(s): "LIPASE", "AMYLASE" in the last 168 hours. No results for input(s): "AMMONIA" in the last 168 hours. Coagulation Profile: No results for input(s): "INR", "PROTIME" in the last 168 hours. Cardiac Enzymes: No results for input(s): "CKTOTAL", "CKMB", "CKMBINDEX", "TROPONINI" in the last 168 hours. BNP (last 3 results) No results for input(s): "PROBNP" in the last 8760 hours. HbA1C: No results for input(s): "HGBA1C" in the last 72 hours. CBG: Recent Labs  Lab 09/11/22 1201 09/11/22 1617 09/11/22 2154 09/12/22 0811 09/12/22 1151  GLUCAP 178* 138* 159* 157* 120*    Lipid Profile: No results for input(s): "CHOL", "HDL", "LDLCALC", "TRIG", "CHOLHDL", "LDLDIRECT" in the last 72 hours. Thyroid Function Tests: No results for input(s): "TSH", "T4TOTAL", "FREET4", "T3FREE", "THYROIDAB" in the last 72 hours.  Anemia Panel: No results for input(s): "VITAMINB12", "FOLATE",  "FERRITIN", "TIBC", "IRON", "RETICCTPCT" in the last 72 hours. Sepsis Labs: No results for input(s): "PROCALCITON", "LATICACIDVEN" in the last 168 hours.   Recent Results (from the past 240 hour(s))  Body fluid culture w Gram Stain     Status: None   Collection Time: 09/03/22  3:03 PM   Specimen: Pleural Fluid  Result Value Ref Range Status   Specimen Description   Final    PLEURAL Performed at [facility] [address], [place], [place] [address]    Special Requests   Final    NONE Performed at [facility], [place] [address] [place], [place] [address]    Gram Stain   Final    RARE WBC PRESENT, PREDOMINANTLY PMN NO ORGANISMS SEEN    Culture   Final    NO GROWTH 3 DAYS Performed at [facility],  [address], [place], [place] [address]    Report Status 09/06/2022 FINAL  Final  MRSA Next Gen by PCR, Nasal     Status: None   Collection Time: 09/03/22  7:39 PM   Specimen: Nasal Mucosa; Nasal Swab  Result Value Ref Range Status   MRSA by PCR Next Gen NOT DETECTED NOT DETECTED Final    Comment: (NOTE) The GeneXpert MRSA Assay (FDA approved for NASAL specimens only), is one component of a comprehensive MRSA colonization surveillance program. It is not intended to diagnose MRSA infection nor to guide or monitor treatment for MRSA infections. Test performance is not FDA approved in patients less than 47 years old. Performed at [facility], [place] [address], [place], [place] [address]          Radiology Studies: DG CHEST PORT 1 VIEW  Result Date: 09/11/2022 CLINICAL DATA:  Hypoxia EXAM: PORTABLE CHEST 1 VIEW COMPARISON:  Previous studies including the examination done on 09/09/2022 FINDINGS: Transverse diameter of heart is increased. Central pulmonary vessels are less prominent. Increased density is seen in left mid and left lower lung fields. This may be caused by left pleural effusion and underlying  infiltrates. There is improvement in aeration in left mid and lower lung fields. There is moderate sized left pleural effusion. Right lung is clear. Right lateral CP angle is clear. There is no pneumothorax. Tip of right IJ chest port is seen in superior vena cava. IMPRESSION: There is interval improvement in aeration in left lung suggesting decrease in left pleural effusion and some improvement in underlying infiltrates. Electronically Signed   By: Elmer Picker M.D.   On: 09/11/2022 08:06        Scheduled Meds:  amiodarone  200 mg Oral BID   Followed by   Derrill Memo ON 09/25/2022] amiodarone  200 mg Oral Daily   apixaban  5 mg Oral BID   atorvastatin  40 mg Oral Daily   Chlorhexidine Gluconate Cloth  6 each Topical Daily   diltiazem  120 mg Oral Daily   dronabinol  2.5 mg Oral BID AC   DULoxetine  60 mg Oral BID   feeding supplement  237 mL Oral BID BM   fluticasone  2 spray Each Nare Daily   furosemide  40 mg Intravenous Daily   guaiFENesin  600 mg Oral BID   insulin aspart  0-6 Units Subcutaneous TID WC   labetalol  100 mg Oral BID   magnesium oxide  400 mg Oral BID   mometasone-formoterol  2 puff Inhalation BID   pantoprazole  40 mg Oral Daily   polyethylene glycol  17 g Oral BID   senna-docusate  1 tablet Oral BID   sodium chloride flush  10-40 mL Intracatheter Q12H   Continuous Infusions:  sodium chloride Stopped (09/11/22 1000)   sodium chloride Stopped (09/12/22 0852)   ampicillin-sulbactam (UNASYN) IV Stopped (09/12/22 1320)     LOS: 14 days    Time spent: 35 minutes    Xylina Rhoads A Deyja Sochacki, MD [facility]   If 7PM-7AM, please contact night-coverage [email or web address]  09/12/2022, 2:05 PM

## 2022-09-12 NOTE — Progress Notes (Signed)
Physical Therapy Treatment Patient Details Name: Rhonda Blackwell MRN: [id number] DOB: Apr 20, 1951 Today's Date: 09/12/2022   History of Present Illness Patient is a 71 year old female who  presented to the hospital with increasing shortness of breath.  Of note, patient was recently admitted between  07/23/22 to 07/27/22 with sepsis secondary to community-acquired multifocal pneumonia.  She was discharged on 2 L nasal cannula at rest and up to 3 L nasal cannula on ambulation. Dx of pleural effusion, hypokalemia, Small acute infarct left frontal white matter.PMH: COPD, Type 2 diabetes, CVA, hypertension atrial fibrillation on Eliquis, metastatic disease with unknown primary    PT Comments    The patient reports feeling well today. Patient able to ambulate a short distance using the RW , on RA, SPO2 >93% during activity. Continue PT for  progressive ambulation as tolerated.     Recommendations for follow up therapy are one component of a multi-disciplinary discharge planning process, led by the attending physician.  Recommendations may be updated based on patient status, additional functional criteria and insurance authorization.  Follow Up Recommendations  Can patient physically be transported by private vehicle: Yes    Assistance Recommended at Discharge Frequent or constant Supervision/Assistance  Patient can return home with the following A lot of help with bathing/dressing/bathroom;Assistance with cooking/housework;Assist for transportation;Help with stairs or ramp for entrance;A little help with walking and/or transfers   Equipment Recommendations  None recommended by PT    Recommendations for Other Services       Precautions / Restrictions Precautions Precautions: Fall Precaution Comments: watch O2 and HR Restrictions Weight Bearing Restrictions: No     Mobility  Bed Mobility Overal bed mobility: Needs Assistance Bed Mobility: Sidelying to Sit   Sidelying to sit: Mod assist        General bed mobility comments: assist to raise trunk and pivot hips to edge of bed, physical A to get BLE back into bed    Transfers Overall transfer level: Needs assistance Equipment used: Rolling walker (2 wheels) Transfers: Sit to/from Stand Sit to Stand: Min assist           General transfer comment: patient able to stand from bed and recliner with min  assistance, cye to push up from surface and reach back to armrest.    Ambulation/Gait Ambulation/Gait assistance: Min assist, +2 safety/equipment Gait Distance (Feet): 8 Feet Assistive device: Rolling walker (2 wheels) Gait Pattern/deviations: Step-through pattern       General Gait Details: patient  tolerated ambulating a short distance in room   Stairs             Wheelchair Mobility    Modified Rankin (Stroke Patients Only)       Balance Overall balance assessment: Needs assistance Sitting-balance support: Feet supported, Single extremity supported Sitting balance-Leahy Scale: Good     Standing balance support: Single extremity supported Standing balance-Leahy Scale: Fair                              Cognition Arousal/Alertness: Awake/alert Behavior During Therapy: WFL for tasks assessed/performed Overall Cognitive Status: Within Functional Limits for tasks assessed                                 General Comments: daughter was present during session. patient was plesant and cooperative        Exercises  General Comments        Pertinent Vitals/Pain Pain Assessment Pain Assessment: No/denies pain    Home Living                          Prior Function            PT Goals (current goals can now be found in the care plan section) Progress towards PT goals: Progressing toward goals    Frequency    Min 2X/week      PT Plan      Co-evaluation PT/OT/SLP Co-Evaluation/Treatment: Yes Reason for Co-Treatment: Complexity of the  patient's impairments (multi-system involvement);For patient/therapist safety PT goals addressed during session: Mobility/safety with mobility OT goals addressed during session: ADL's and self-care      AM-PAC PT "6 Clicks" Mobility   Outcome Measure  Help needed turning from your back to your side while in a flat bed without using bedrails?: A Little Help needed moving from lying on your back to sitting on the side of a flat bed without using bedrails?: A Little Help needed moving to and from a bed to a chair (including a wheelchair)?: A Little Help needed standing up from a chair using your arms (e.g., wheelchair or bedside chair)?: A Lot Help needed to walk in hospital room?: Total Help needed climbing 3-5 steps with a railing? : Total 6 Click Score: 13    End of Session   Activity Tolerance: Patient tolerated treatment well Patient left: with call bell/phone within reach;in bed;with family/visitor present Nurse Communication: Mobility status PT Visit Diagnosis: Difficulty in walking, not elsewhere classified (R26.2)     Time: [id number] PT Time Calculation (min) (ACUTE ONLY): 26 min  Charges:  $Gait Training: 8-22 mins                     [place] Office [phone] Weekend pager-[phone]    Claretha Cooper 09/12/2022, 12:58 PM

## 2022-09-12 NOTE — Progress Notes (Signed)
Unfortunately, Rhonda Blackwell is still in the ICU.  Apparently, over the weekend she was supposed be transferred out and then developed atrial fibrillation.  She was placed on amiodarone.  She now I think is on oral amiodarone.  Her heart monitor shows normal sinus rhythm.  She continues with her radiation therapy.  I think she has 2 more radiation treatments.  As she gets her chemotherapy tomorrow.  I think one of her daughter who was in with her.  I think the plan is to try to get her to some skilled nursing or rehab so she can get stronger.  She really is not able to do all that much.  I know that everybody is trying to work with her to try to help get her stronger.  Apparently she is eating okay.  I told her that she really needs to eat small frequent meals.  This will certainly help her out more than anything else.  Overall, I think she has tolerated chemotherapy pretty well.  There are no labs back yet for today.  She has not complained of any pain.  There is no dyspnea.  There is no nausea or vomiting.  Is hard to say how often she is going to the bathroom.  Her vital signs show temperature 97.4.  Pulse 71.  Blood pressure 109/55.  Oxygen saturation is 90% on room air.  Her lungs sound pretty clear over on the right side.  There is still some decreased on the left side.  I do not hear any wheezing.  Cardiac exam regular rate and rhythm.  She has no murmurs.  Abdomen is soft.  Bowel sounds are present.  There is no guarding or rebound tenderness.  There is no fluid wave.  Extremity shows no clubbing, cyanosis or edema.  Neurological exam shows no focal neurological deficits.  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  There is no targetable mutations from her studies that were done at [facility].  She has another dose of chemotherapy with radiation therapy.  I then told she and her daughter that we will then give her a couple week break.  I would  then try to do full dose chemotherapy along with immunotherapy in the office.  Hopefully, she will be able to improve her quality of life and overall performance status.  I do appreciate the incredible help from the ICU staff.  They have really done a tremendous job with this challenging case.   Rhonda Haw, MD  Rhonda Blackwell 22:19

## 2022-09-12 NOTE — Progress Notes (Addendum)
Occupational Therapy Treatment Patient Details Name: Rhonda Blackwell MRN: [id number] DOB: 11-Aug-1950 Today's Date: 09/12/2022   History of present illness Patient is a 72 year old female who  presented to the hospital with increasing shortness of breath.  Of note, patient was recently admitted between  07/23/22 to 07/27/22 with sepsis secondary to community-acquired multifocal pneumonia.  She was discharged on 2 L nasal cannula at rest and up to 3 L nasal cannula on ambulation. 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 Patient was able to engage in UB/LB bathing and dressing tasks with reduce A compared to evaluation. Patient is motivated to get out of bed. Patient and daughter were educated on using [facility] during the day with nursing staff. Patient and daughter verbalized understanding. Patient's discharge plan remains appropriate at this time. OT will continue to follow acutely.     Recommendations for follow up therapy are one component of a multi-disciplinary discharge planning process, led by the attending physician.  Recommendations may be updated based on patient status, additional functional criteria and insurance authorization.    Assistance Recommended at Discharge Frequent or constant Supervision/Assistance  Patient can return home with the following  Help with stairs or ramp for entrance;A lot of help with walking and/or transfers;A lot of help with bathing/dressing/bathroom;Assist for transportation;Assistance with cooking/housework   Equipment Recommendations  BSC/3in1       Precautions / Restrictions Precautions Precautions: Fall Precaution Comments: watch O2 and HR Restrictions Weight Bearing Restrictions: No       Mobility Bed Mobility Overal bed  mobility: Needs Assistance Bed Mobility: Sidelying to Sit   Sidelying to sit: Mod assist Supine to sit: Mod assist     General bed mobility comments: assist to raise trunk and pivot hips to edge of bed, physical A to get BLE back into bed       Balance Overall balance assessment: Needs assistance Sitting-balance support: Feet supported, Single extremity supported Sitting balance-Leahy Scale: Good     Standing balance support: Single extremity supported Standing balance-Leahy Scale: Fair           ADL either performed or assessed with clinical judgement   ADL Overall ADL's : Needs assistance/impaired     Grooming: Wash/dry hands;Wash/dry face;Supervision/safety;Sitting   Upper Body Bathing: Set up;Sitting   Lower Body Bathing: Minimal assistance Lower Body Bathing Details (indicate cue type and reason): patient was able to get thighs and some perineal area seated with weight shifting. Upper Body Dressing : Minimal assistance Upper Body Dressing Details (indicate cue type and reason): donning hospital gown seated in recliner. Lower Body Dressing: Maximal assistance;Sit to/from stand Lower Body Dressing Details (indicate cue type and reason): mesh underwear Toilet Transfer: Minimal assistance;Ambulation;Rolling walker (2 wheels) Toilet Transfer Details (indicate cue type and reason): with RW with patient motivated to stand more. able to maintain o2 saturations 94% or better during session on RA Toileting- Clothing Manipulation and Hygiene: Minimal assistance;Sit to/from stand;Sitting/lateral lean Toileting - Clothing Manipulation Details (indicate cue type and reason): patient was able to get most perineal areas washed seated and then min A to complete in standing. patient needed A to pull up mesh underwear.              Cognition Arousal/Alertness: Awake/alert Behavior During Therapy: Flat affect Overall Cognitive Status: Within  Functional Limits for tasks  assessed         General Comments: daughter was present during session. patient was plesant and cooperative                   Pertinent Vitals/ Pain       Pain Assessment Pain Assessment: No/denies pain         Frequency  Min 2X/week        Progress Toward Goals  OT Goals(current goals can now be found in the care plan section)  Progress towards OT goals: Progressing toward goals     Plan Discharge plan remains appropriate       AM-PAC OT "6 Clicks" Daily Activity     Outcome Measure   Help from another person eating meals?: None Help from another person taking care of personal grooming?: A Little Help from another person toileting, which includes using toliet, bedpan, or urinal?: A Lot Help from another person bathing (including washing, rinsing, drying)?: A Lot Help from another person to put on and taking off regular upper body clothing?: A Little Help from another person to put on and taking off regular lower body clothing?: A Lot 6 Click Score: 16    End of Session Equipment Utilized During Treatment: Rolling walker (2 wheels);Gait belt  OT Visit Diagnosis: Unsteadiness on feet (R26.81);Muscle weakness (generalized) (M62.81)   Activity Tolerance Patient tolerated treatment well   Patient Left with call bell/phone within reach;with family/visitor present;in bed;with bed alarm set   Nurse Communication Mobility status;Other (comment) (bed being unplugged upon entrance to room)        Time: [id number] OT Time Calculation (min): 25 min  Charges: OT General Charges $OT Visit: 1 Visit OT Treatments $Self Care/Home Management : 8-22 mins  Rennie Plowman, MS Acute Rehabilitation Department Office# [phone]   Willa Rough 09/12/2022, 9:22 AM

## 2022-09-12 NOTE — Consult Note (Addendum)
Consultation Note Date: 09/12/2022   Patient Name: Rhonda Blackwell  DOB: 1950/07/13  MRN: [id number]  Age / Sex: 72 y.o., female   PCP: Rhonda Petty, MD Referring Physician: Elmarie Shiley, MD  Reason for Consultation: Establishing goals of care     Chief Complaint/History of Present Illness:   Patient is a 72 year old female with a past medical history of COPD, diabetes mellitus type 2, CVA, hypertension, A-fib on Eliquis, and metastatic non-small cell lung cancer (mets to bone) who was admitted on 08/29/2022 for management of worsening shortness of breath.  Imaging upon admission showed concerns for obstruction due to neoplasm.  Since admission patient underwent thoracentesis and subsequently chest tube placement on 09/05/2022.  Patient has been receiving XRT and started on chemotherapy since 09/06/2022.  Patient had chemical pleurodesis with chest tube removal on 09/07/2022.  Patient has continued to receive management for acute on chronic hypoxic respiratory failure during hospitalization.  Palliative medicine team consulted to assist with complex medical decision making.  Extensive review of EMR prior to seeing patient.  Patient is followed by Dr. Marin Blackwell, oncology.  Continuing with radiation and chemotherapy at this time. Dr. Marin Blackwell noted today "will give her a couple weeks break. Would then try to do full dose chemotherapy along with immunotherapy in the office".  Patient has been evaluated by PT/OT and recommendation is discharge to SNF for rehab.  Presented to bedside and introduced myself and the role of the palliative medicine team in patient's care. Patient niece, Rhonda Blackwell, was at bedside as well and patient gave permission to continue with conversation with her present.  Patient actively eating lunch at this time.  Patient able to discuss plan to continue conversations with Dr. Marin Blackwell regarding her cancer management.  Patient able to discuss plan to continue with radiation and  chemotherapy.  Patient notes that she continues to feel "better" every day since she has come into the hospital.  Patient's breathing improved at this time.  Patient hopeful to continue with cancer directed therapies.  Patient denied any symptoms of pain, dyspnea, or constipation at this time.  Patient notes that her last bowel movement was earlier today.  Patient able to state that if she is unable to make medical decisions for herself, she would want her spouse, Rhonda Blackwell, to make medical decisions for her.  Spent time providing emotional support.  Encouraged follow-up with palliative medicine team in the outpatient setting at [facility] since that is where patient sees Dr. Marin Blackwell.  Patient agreeing with this plan.  All questions answered at that time.  Thanked patient for allowing me to visit with her today.  Discussed care with bedside RN after visit.   Primary Diagnoses  Present on Admission:  Essential hypertension  Lung cancer, primary, with metastasis from lung to other site, left [facility])  Hyponatremia   Past Medical History:  Diagnosis Date   Bronchitis    COPD (chronic obstructive pulmonary disease) ([place])    Diabetes mellitus without complication ([place])    Hypertension    Lung cancer, primary, with metastasis from lung to other site, left ([place]) 09/04/2022   Social History   Socioeconomic History   Marital status: Married    Spouse name: Not on file   Number of children: Not on file   Years of education: Not on file   Highest education level: Not on file  Occupational History   Not on file  Tobacco Use   Smoking status: Some Days    Packs/day: 1  Types: Cigarettes   Smokeless tobacco: Never  Substance and Sexual Activity   Alcohol use: No   Drug use: No   Sexual activity: Not on file  Other Topics Concern   Not on file  Social History Narrative   Not on file   Social Determinants of Health   Financial Resource Strain: Not on file  Food Insecurity: No Food  Insecurity (08/30/2022)   Hunger Vital Sign    Worried About Running Out of Food in the Last Year: Never true    Ran Out of Food in the Last Year: Never true  Transportation Needs: No Transportation Needs (08/30/2022)   PRAPARE - Hydrologist (Medical): No    Lack of Transportation (Non-Medical): No  Physical Activity: Not on file  Stress: Not on file  Social Connections: Not on file   Family History  Problem Relation Age of Onset   Diabetes Mellitus II Sister    Scheduled Meds:  amiodarone  200 mg Oral BID   Followed by   Derrill Memo ON 09/25/2022] amiodarone  200 mg Oral Daily   apixaban  5 mg Oral BID   atorvastatin  40 mg Oral Daily   Chlorhexidine Gluconate Cloth  6 each Topical Daily   diltiazem  120 mg Oral Daily   dronabinol  2.5 mg Oral BID AC   DULoxetine  60 mg Oral BID   feeding supplement  237 mL Oral BID BM   fluticasone  2 spray Each Nare Daily   furosemide  40 mg Intravenous Daily   guaiFENesin  600 mg Oral BID   insulin aspart  0-6 Units Subcutaneous TID WC   labetalol  100 mg Oral BID   magnesium oxide  400 mg Oral BID   mometasone-formoterol  2 puff Inhalation BID   pantoprazole  40 mg Oral Daily   polyethylene glycol  17 g Oral BID   senna-docusate  1 tablet Oral BID   sodium chloride flush  10-40 mL Intracatheter Q12H   Continuous Infusions:  sodium chloride Stopped (09/11/22 1000)   sodium chloride Stopped (09/12/22 0852)   ampicillin-sulbactam (UNASYN) IV 3 g (09/12/22 1250)   PRN Meds:.sodium chloride, sodium chloride, acetaminophen, albuterol, bisacodyl, guaiFENesin-dextromethorphan, metoprolol tartrate, morphine injection, mouth rinse, sodium chloride flush Allergies  Allergen Reactions   Bystolic [Nebivolol Hcl] Nausea Only and Other (See Comments)    Abdominal pain   Hydrochlorothiazide Nausea Only and Swelling    Leg swelling   Norvasc [Amlodipine] Swelling    Leg swelling   CBC:    Component Value Date/Time    WBC 8.9 09/11/2022 0800   HGB 10.2 (L) 09/11/2022 0800   HCT 32.3 (L) 09/11/2022 0800   PLT 519 (H) 09/11/2022 0800   MCV 78.4 (L) 09/11/2022 0800   NEUTROABS 10.8 (H) 09/04/2022 0614   LYMPHSABS 0.7 09/04/2022 0614   MONOABS 0.6 09/04/2022 0614   EOSABS 0.0 09/04/2022 0614   BASOSABS 0.0 09/04/2022 0614   Comprehensive Metabolic Panel:    Component Value Date/Time   NA 132 (L) 09/11/2022 0800   K 4.0 09/11/2022 0800   CL 102 09/11/2022 0800   CO2 23 09/11/2022 0800   BUN 15 09/11/2022 0800   CREATININE 0.69 09/11/2022 0800   GLUCOSE 152 (H) 09/11/2022 0800   CALCIUM 8.0 (L) 09/11/2022 0800   CALCIUM 7.0 (L) 09/05/2022 0839   AST 16 09/10/2022 0736   ALT 18 09/10/2022 0736   ALKPHOS 86 09/10/2022 0736   BILITOT 0.5  09/10/2022 0736   PROT 6.6 09/10/2022 0736   ALBUMIN 2.6 (L) 09/10/2022 0736    Physical Exam: Vital Signs: BP (!) 121/57   Pulse 75   Temp 98.9 F (37.2 C) (Oral)   Resp 16   Ht 5\' 4"  (1.626 m)   Wt 66.5 kg   SpO2 91%   BMI 25.16 kg/m  SpO2: SpO2: 91 % O2 Device: O2 Device: Nasal Cannula O2 Flow Rate: O2 Flow Rate (L/min): 3 L/min Intake/output summary:  Intake/Output Summary (Last 24 hours) at 09/12/2022 1256 Last data filed at 09/12/2022 [id number] Gross per 24 hour  Intake 796.4 ml  Output 950 ml  Net -153.6 ml   LBM: 09/12/22 Baseline Weight: Weight: 65.8 kg Most recent weight: Weight: 66.5 kg  General: NAD, alert, laying in bed, pleasant Eyes: No drainage noted HENT: moist mucous membranes Cardiovascular: RRR Respiratory: no increased work of breathing noted, not in respiratory distress Abdomen: not distended Extremities: Moving all appropriately Skin: no rashes or lesions on visible skin Neuro: A&Ox4, following commands easily Psych: appropriately answers all questions          Palliative Performance Scale: 50%              Additional Data Reviewed: Recent Labs    09/10/22 0736 09/11/22 0800  WBC 10.3 8.9  HGB 10.2* 10.2*  PLT  471* 519*  NA 131* 132*  BUN 16 15  CREATININE 0.74 0.69    Imaging: DG CHEST PORT 1 VIEW CLINICAL DATA:  Hypoxia  EXAM: PORTABLE CHEST 1 VIEW  COMPARISON:  Previous studies including the examination done on 09/09/2022  FINDINGS: Transverse diameter of heart is increased. Central pulmonary vessels are less prominent. Increased density is seen in left mid and left lower lung fields. This may be caused by left pleural effusion and underlying infiltrates. There is improvement in aeration in left mid and lower lung fields. There is moderate sized left pleural effusion. Right lung is clear. Right lateral CP angle is clear. There is no pneumothorax. Tip of right IJ chest port is seen in superior vena cava.  IMPRESSION: There is interval improvement in aeration in left lung suggesting decrease in left pleural effusion and some improvement in underlying infiltrates.  Electronically Signed   By: Elmer Picker M.D.   On: 09/11/2022 08:06    I personally reviewed recent imaging.   Palliative Care Assessment and Plan Summary of Established Goals of Care and Medical Treatment Preferences   Patient is a 72 year old female with a past medical history of COPD, diabetes mellitus type 2, CVA, hypertension, A-fib on Eliquis, and metastatic non-small cell lung cancer (mets to bone) who was admitted on 08/29/2022 for management of worsening shortness of breath.  Imaging upon admission showed concerns for obstruction due to neoplasm.  Since admission patient underwent thoracentesis and subsequently chest tube placement on 09/05/2022.  Patient has been receiving XRT and started on chemotherapy since 09/06/2022.  Patient had chemical pleurodesis with chest tube removal on 09/07/2022.  Patient has continued to receive management for acute on chronic hypoxic respiratory failure during hospitalization.  Palliative medicine team consulted to assist with complex medical decision making.  # Complex  medical decision making/goals of care  -Patient has had continued conversations with her oncologist, Dr. Marin Blackwell, regarding her cancer directed care moving forward.  Patient hopeful to continue with chemotherapy and radiation as per Dr. Antonieta Pert recommendations.  -Patient states that if she is not able to make medical decisions for herself, she would want  her husband, Tawanda Barkin, to make medical decisions for her.  -Placed referral to outpatient PMT provider at [facility] to continue supporting ACP moving forward in the outpatient setting while following up with Dr. Marin Blackwell.  -  Code Status: Full Code   # Symptom management  Patient denies any symptoms of concern at this time.  Management as per primary hospitalist.  # Psycho-social/Spiritual Support:  - Support System: spouse, daughter, niece  # Discharge Planning:  [facility] for rehab with Palliative care service follow-up  -Recommended referral to outpatient palliative medicine team at [facility] where patient follows up with Dr. Marin Blackwell. Consult order placed.   Thank you for allowing the palliative care team to participate in the care [facility]  Chelsea Aus, DO Palliative Care Provider PMT # [phone]  If patient remains symptomatic despite maximum doses, please call PMT at [phone] between 0700 and 1900. Outside of these hours, please call attending, as PMT does not have night coverage.  This provider spent a total of 80 minutes providing patient's care.  Includes review of EMR, discussing care with other staff members involved in patient's medical care, obtaining relevant history and information from patient and/or patient's family, and personal review of imaging and lab work. Greater than 50% of the time was spent counseling and coordinating care related to the above assessment and plan.    *Please note that this is a verbal dictation therefore any spelling or grammatical errors are due to the "[place] [employer]"  system interpretation.

## 2022-09-13 ENCOUNTER — Ambulatory Visit
Admission: RE | Admit: 2022-09-13 | Discharge: 2022-09-13 | Disposition: A | Discharge status: Home / Self Care | Payer: Medicare Other | Source: Ambulatory Visit | Attending: Radiation Oncology | Admitting: Radiation Oncology

## 2022-09-13 ENCOUNTER — Other Ambulatory Visit: Payer: Self-pay

## 2022-09-13 DIAGNOSIS — J9621 Acute and chronic respiratory failure with hypoxia: Secondary | ICD-10-CM | POA: Diagnosis not present

## 2022-09-13 LAB — COMPREHENSIVE METABOLIC PANEL
ALT: 14 U/L (ref 0–44)
AST: 15 U/L (ref 15–41)
Albumin: 2.6 g/dL — ABNORMAL LOW (ref 3.5–5.0)
Alkaline Phosphatase: 82 U/L (ref 38–126)
Anion gap: 10 (ref 5–15)
BUN: 15 mg/dL (ref 8–23)
CO2: 23 mmol/L (ref 22–32)
Calcium: 8 mg/dL — ABNORMAL LOW (ref 8.9–10.3)
Chloride: 98 mmol/L (ref 98–111)
Creatinine, Ser: 0.72 mg/dL (ref 0.44–1.00)
GFR, Estimated: >60 mL/min (ref >60)
Glucose, Bld: 199 mg/dL — ABNORMAL HIGH (ref 70–99)
Potassium: 3.7 mmol/L (ref 3.5–5.1)
Sodium: 131 mmol/L — ABNORMAL LOW (ref 135–145)
Total Bilirubin: 0.7 mg/dL (ref 0.3–1.2)
Total Protein: 6.4 g/dL — ABNORMAL LOW (ref 6.5–8.1)

## 2022-09-13 LAB — CBC WITH DIFFERENTIAL/PLATELET
Abs Immature Granulocytes: 0.05 10*3/uL (ref 0.00–0.07)
Basophils Absolute: 0 10*3/uL (ref 0.0–0.1)
Basophils Relative: 1 %
Eosinophils Absolute: 0.1 10*3/uL (ref 0.0–0.5)
Eosinophils Relative: 1 %
HCT: 30.9 % — ABNORMAL LOW (ref 36.0–46.0)
Hemoglobin: 9.7 g/dL — ABNORMAL LOW (ref 12.0–15.0)
Immature Granulocytes: 1 %
Lymphocytes Relative: 11 %
Lymphs Abs: 0.8 10*3/uL (ref 0.7–4.0)
MCH: 24.7 pg — ABNORMAL LOW (ref 26.0–34.0)
MCHC: 31.4 g/dL (ref 30.0–36.0)
MCV: 78.8 fL — ABNORMAL LOW (ref 80.0–100.0)
Monocytes Absolute: 0.6 10*3/uL (ref 0.1–1.0)
Monocytes Relative: 8 %
Neutro Abs: 5.9 10*3/uL (ref 1.7–7.7)
Neutrophils Relative %: 78 %
Platelets: 509 10*3/uL — ABNORMAL HIGH (ref 150–400)
RBC: 3.92 MIL/uL (ref 3.87–5.11)
RDW: 14.5 % (ref 11.5–15.5)
WBC: 7.5 10*3/uL (ref 4.0–10.5)
nRBC: 0 % (ref 0.0–0.2)

## 2022-09-13 LAB — RAD ONC ARIA SESSION SUMMARY
Course Elapsed Days: 12
Plan Fractions Treated to Date: 8
Plan Prescribed Dose Per Fraction: 3 Gy
Plan Total Fractions Prescribed: 8
Plan Total Prescribed Dose: 24 Gy
Reference Point Dosage Given to Date: 24 Gy
Reference Point Session Dosage Given: 3 Gy
Session Number: 9

## 2022-09-13 LAB — GLUCOSE, CAPILLARY
Glucose-Capillary: 154 mg/dL — ABNORMAL HIGH (ref 70–99)
Glucose-Capillary: 132 mg/dL — ABNORMAL HIGH (ref 70–99)
Glucose-Capillary: 190 mg/dL — ABNORMAL HIGH (ref 70–99)
Glucose-Capillary: 127 mg/dL — ABNORMAL HIGH (ref 70–99)

## 2022-09-13 MED ORDER — FAMOTIDINE IN NACL 20-0.9 MG/50ML-% IV SOLN
20.0000 mg | Freq: Once | INTRAVENOUS | Status: AC
  Administered 2022-09-14: 20 mg via INTRAVENOUS
  Filled 2022-09-13: qty 50

## 2022-09-13 MED ORDER — SODIUM CHLORIDE 0.9 % IV SOLN: Freq: Once | INTRAVENOUS | Status: AC

## 2022-09-13 MED ORDER — SODIUM CHLORIDE 0.9 % IV SOLN
10.0000 mg | Freq: Once | INTRAVENOUS | Status: AC
  Administered 2022-09-14: 10 mg via INTRAVENOUS
  Filled 2022-09-13: qty 1

## 2022-09-13 MED ORDER — PALONOSETRON HCL INJECTION 0.25 MG/5ML
0.2500 mg | Freq: Once | INTRAVENOUS | Status: AC
  Administered 2022-09-14: 0.25 mg via INTRAVENOUS
  Filled 2022-09-13: qty 5

## 2022-09-13 MED ORDER — DIPHENHYDRAMINE HCL 50 MG/ML IJ SOLN
50.0000 mg | Freq: Once | INTRAMUSCULAR | Status: AC
  Administered 2022-09-14: 50 mg via INTRAVENOUS
  Filled 2022-09-13: qty 1

## 2022-09-13 NOTE — Progress Notes (Signed)
  Daily Progress Note   Patient Name: Rhonda Blackwell       Date: 09/13/2022 DOB: Dec 26, 1950  Age: 72 y.o. MRN#: [id number] Attending Physician: Bonnell Public, MD Primary Care Physician: [name], MD Admit Date: 08/29/2022 Length of Stay: 15 days  Patient last seen by this palliative provider on 09/12/22. Patient's medical status continues to slowly improve with plan for rehab upon discharge. Patient has metastatic NSCLC and continues open discussions with Dr. Marin Olp regarding medical plans for cancer directed therapies. At this time, symptoms currently managed. Already placed referral to outpatient PMT provider at [facility] to support conversations regarding medical care moving forward. Unless patient's medical status further deteriorates, hoping the outpatient setting will allow for more open conversations about quality of life moving forward to plan accordingly. PMT will sign off at this time. Please reach out if acute needs arise. Thank you.    Chelsea Aus, DO Palliative Care Provider PMT # [phone]

## 2022-09-13 NOTE — Progress Notes (Signed)
PROGRESS NOTE    Rhonda Blackwell  [id number] DOB: Aug 27, 1950 DOA: 08/29/2022 PCP: [name], MD   Brief Narrative: 72 year old with past medical history significant for COPD, diabetes type 2, CVA, hypertension, A-fib on Eliquis, metastatic disease initially with unknown primary presented to hospital with worsening shortness of breath.  Of note recent admission from 07/23/2022 1 to 07/27/2022 with sepsis secondary to pneumonia multifocal.  Discharged on 2 L of oxygen.  She was also recently diagnosed with A-fib.  Since her diagnosis she has been having shortness of breath and not feeling well.  She has a history of hypermetabolic left upper lobe nodule as well as hypermetabolic hilar and mediastinal lymph nodes, left scapular lesion, area of right acetabulum and rectum concerning for metastatic disease on PET scan 04/2022.  She was followed by [facility]ology/oncology, underwent biopsy of left scapular lesion 06/08/2022 and bronchoscopy EBUS on 07/04/2022, she did not follow-up with oncology and multiple appointments.  She presented to ED and was found to be A-fib RVR.  Developed worsening hypoxia requiring up to 8 L of high flow oxygen.  CT chest showed masslike consolidation in the left upper lobe abutting the left hilar concerning for central obstructing neoplasm. peripheral areas of consolidation and interlobar septal thickening throughout the left upper lobe concerning for combination of postobstructive changes, regional metastatic disease and lymphangitic spread of tumor. Mediastinal and hilar adenopathy consistent with nodule metastasis. 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   Patient underwent thoracentesis and subsequently chest  tube placement left pleural space on 3/19.  She is undergoing radiation therapy and has been a started on chemotherapy first dose 3/20. Chest tube  was removed 3/21 after chemical pleurodesis.  Underwent flexible sigmoidoscopy on 3/22 due to abnormal rectal area on Pet scan which was normal.   Plan for chemotherapy 3/27. Monitor A fib on oral medications. She had episode of A fib RVR the night of 3/25.  09/13/2022: Patient seen.  No new complaints today.  Input from the oncology team is highly appreciated.  Assessment & Plan:   Principal Problem:   Acute on chronic respiratory failure with hypoxia (HCC) Active Problems:   Hyponatremia   Type 2 diabetes mellitus without complications (HCC)   Essential hypertension   Paroxysmal atrial fibrillation (HCC)   History of CVA (cerebrovascular accident)   Chronic anxiety   Tobacco use   Malignant neoplasm of hilus of left lung (HCC)   Lung mass   Acute CVA (cerebrovascular accident) ([place])   Pleural effusion on left   Lung cancer, primary, with metastasis from lung to other site, left [facility])   Hypocalcemia   Hypophosphatemia   Palliative care encounter   Atrial fibrillation with RVR ([place])   Goals of care, counseling/discussion   Counseling and coordination of care  1-Acute on Chronic Hypoxic Respiratory failure secondary to Left-sided Pleural Effusion, Lung mass: Flash Pulmonary edema, lung reexpansion.  -Acute on chronic respiratory failure likely multifactorial secondary to lung mass, postobstructive pneumonia, large left pleural effusion. -Patient oxygen requirement increased up to 15 L.  CCM consulted and underwent thoracentesis 3/17 yielding 800 cc of fluids. -CT chest showed masslike consolidation left upper lobe abutting left hilar concerning for central obstructing neoplasm. -On IV antibiotics to cover for postobstructive pneumonia. Day 12 of antibiotics.  -Oncology following and concern is for adenocarcinoma of the lung. -Chest x-ray showed reaccumulation of pleural fluid.  Underwent left side pigtail catheter placement by CCM on 3/19.  Underwent Chemical Pleurodesis via chest tube 3/20. -Continue with IV lasix.  -Chest  tube removed 3/21. Stable to 3 L oxygen.  Repeated chest x ray 3/23; appears to have re- accumulation of fluid. CCM consulted.  Chest x ray 3/25; improved aeration left lung.  09/13/2022: Patient is stable.  2-Metastatic cancer suspect primary Lung. Lung Mass. Left Pleural; effusion.  -Bone scan: Multifocal skeletal metastases involving the spine, ribs, left scapula and minimal involvement of the left hemipelvis. -CT head, neck: Multiple calvarial metastasis, largest located in the right parietal bone with transcortical extension. -Patient evaluated by radiation oncology, patient was a started on radiation treatments.  She will need to complete 2-week course of treatment.  -Dr. Marin Olp following. -Port cath placement 3/18 by IR -Started  chemotherapy 3/20. She will need Chemo on 3/27 -Pleural fluid cytology: Malignant cell presents, poorly differentiated carcinoma. Likely adenocarcinoma.  -Palliative care consulted for support and discussion of goals of care, Code status.  09/13/2022: Patient is currently undergoing radiation therapy.  3-Hypokalemia; Replaced.  09/13/2022: Potassium is 3.7 today.  4-Small acute infarct left frontal white matter, prior recent history of CVA -MRI done on 09/02/2022 for staging show a small acute infarct in the left frontal white matter.  Chronic small vessel ischemia with multiple chronic lacunar infarct.  Multiple calvarial metastasis. -CT angio head and neck no large vessel occlusion. -Had a recent 2D echo.  This has not been repeated. -LDL: 51.  A1c 6.3. -Continue with the statins. -Transition to Eliquis.   5-A-fib RVR:  Intermittent episode of A-fib RVR.  Intermittently on Cardizem drip. On oral Labetalol.  3-23: HR jump to 180  A fib-- BP 100. 5 Mg IV metoprolol. HR down to 120--130. SBP 112.  Treated with IV amiodarone, transition to oral.  On Cardizem 120 mg daily  On Eliquis.  Appreciate cardiology assistance.  Transition to oral Amiodarone  3/25. Needs dose reduction in 2 weeks.  Had episode of A fib RVR 3/25, responded to IV metoprolol.  Monitor.  09/13/2022: Patient is currently on amiodarone, Eliquis and IV Lopressor as needed.  Heart rate is controlled.  6-Hypomagnesemia, hypophosphatemia and hypocalcemia Replaced.   Hypertension: On Cardizem, Avapro, labetalol.   Tobacco use: Cessation encouraged  Diabetes type 2: A1c 6.3 Sliding scale insulin  Chronic anxiety: Continue with Cymbalta.  Hypermetabolic activity in the rectum seen on PET scan: CEA elevated 1400 Underwent sigmoidoscopy: negative.   Hyponatremia:  -Chronic. -Sodium is 131 today.   Estimated body mass index is 25.16 kg/m as calculated from the following:   Height as of this encounter: 5\' 4"  (1.626 m).   Weight as of this encounter: 66.5 kg.   DVT prophylaxis: Heparin  Code Status: Full code Family Communication: Care discussed with patient. Daughter at bedside.  Disposition Plan:  Status is: Inpatient Remains inpatient appropriate because: management of metastatic cancer, malignancy     Consultants:  Radiation oncology Dr Marin Olp CCM Dr Benson [place] , GI Neurology   Procedures:  CT chest 08/29/2022 Chest x-ray 08/29/2022, 09/03/2022 MRI brain 09/02/2022 Bone scan 09/01/2022 Thoracentesis per PCCM, Dr.Olalere 09/03/2022 Port-A-Cath placement 09/04/2022  Antimicrobials:  -Patient has completed course of IV Unasyn   Subjective: No new complaints. No shortness of breath. No fever or chills. No cough.  Objective: Vitals:   09/13/22 0500 09/13/22 0600 09/13/22 0647 09/13/22 0805  BP: 118/62 (!) 118/51    Pulse: 71 69 73   Resp: 16 19 17    Temp:   97.8 F (36.6 C) 98.7 F (37.1  C)  TempSrc:   Oral Oral  SpO2: 91% 92% 90%   Weight:      Height:        Intake/Output Summary (Last 24 hours) at 09/13/2022 1051 Last data filed at 09/12/2022 2046 Gross per 24 hour  Intake 200 ml  Output --  Net 200 ml    Filed Weights   09/02/22  0526 09/03/22 1900 09/04/22 0600  Weight: 65.4 kg 65.4 kg 66.5 kg    Examination:  General exam: NAD Respiratory system: Clear to auscultation anteriorly. Cardiovascular system: S 1, S 2 Gastrointestinal system: Soft and nontender. Central nervous system: Awake and alert.  Patient moves all extremities. Extremities: No edema    Data Reviewed: I have personally reviewed following labs and imaging studies  CBC: Recent Labs  Lab 09/08/22 0347 09/09/22 0519 09/10/22 0736 09/11/22 0800 09/13/22 0844  WBC 9.6 8.9 10.3 8.9 7.5  NEUTROABS  --   --   --   --  5.9  HGB 9.8* 10.3* 10.2* 10.2* 9.7*  HCT 31.3* 33.1* 31.9* 32.3* 30.9*  MCV 78.8* 79.2* 78.2* 78.4* 78.8*  PLT 434* 449* 471* 519* 509*    Basic Metabolic Panel: Recent Labs  Lab 09/07/22 0929 09/08/22 0347 09/09/22 0822 09/10/22 0736 09/11/22 0800 09/13/22 0844  NA 135 135 132* 131* 132* 131*  K 3.5 4.0 4.0 3.8 4.0 3.7  CL 104 104 102 100 102 98  CO2 23 21* 21* 21* 23 23  GLUCOSE 131* 131* 153* 162* 152* 199*  BUN 12 13 14 16 15 15   CREATININE 0.70 0.74 0.64 0.74 0.69 0.72  CALCIUM 7.1* 7.0* 7.5* 7.8* 8.0* 8.0*  MG 2.2  --  2.2  --   --   --     GFR: Estimated Creatinine Clearance: 60.5 mL/min (by C-G formula based on SCr of 0.72 mg/dL). Liver Function Tests: Recent Labs  Lab 09/10/22 0736 09/13/22 0844  AST 16 15  ALT 18 14  ALKPHOS 86 82  BILITOT 0.5 0.7  PROT 6.6 6.4*  ALBUMIN 2.6* 2.6*    No results for input(s): "LIPASE", "AMYLASE" in the last 168 hours. No results for input(s): "AMMONIA" in the last 168 hours. Coagulation Profile: No results for input(s): "INR", "PROTIME" in the last 168 hours. Cardiac Enzymes: No results for input(s): "CKTOTAL", "CKMB", "CKMBINDEX", "TROPONINI" in the last 168 hours. BNP (last 3 results) No results for input(s): "PROBNP" in the last 8760 hours. HbA1C: No results for input(s): "HGBA1C" in the last 72 hours. CBG: Recent Labs  Lab 09/12/22 0811  09/12/22 1151 09/12/22 1703 09/12/22 2133 09/13/22 0749  GLUCAP 157* 120* 135* 223* 154*    Lipid Profile: No results for input(s): "CHOL", "HDL", "LDLCALC", "TRIG", "CHOLHDL", "LDLDIRECT" in the last 72 hours. Thyroid Function Tests: No results for input(s): "TSH", "T4TOTAL", "FREET4", "T3FREE", "THYROIDAB" in the last 72 hours.  Anemia Panel: No results for input(s): "VITAMINB12", "FOLATE", "FERRITIN", "TIBC", "IRON", "RETICCTPCT" in the last 72 hours. Sepsis Labs: No results for input(s): "PROCALCITON", "LATICACIDVEN" in the last 168 hours.   Recent Results (from the past 240 hour(s))  Body fluid culture w Gram Stain     Status: None   Collection Time: 09/03/22  3:03 PM   Specimen: Pleural Fluid  Result Value Ref Range Status   Specimen Description   Final    PLEURAL Performed at [facility] [address], [place], [place] [address]    Special Requests   Final    NONE Performed at  [facility], [place] [address], [place], [place] [address]    Gram Stain   Final    RARE WBC PRESENT, PREDOMINANTLY PMN NO ORGANISMS SEEN    Culture   Final    NO GROWTH 3 DAYS Performed at [facility] [address], [place], [place] [address]    Report Status 09/06/2022 FINAL  Final  MRSA Next Gen by PCR, Nasal     Status: None   Collection Time: 09/03/22  7:39 PM   Specimen: Nasal Mucosa; Nasal Swab  Result Value Ref Range Status   MRSA by PCR Next Gen NOT DETECTED NOT DETECTED Final    Comment: (NOTE) The GeneXpert MRSA Assay (FDA approved for NASAL specimens only), is one component of a comprehensive MRSA colonization surveillance program. It is not intended to diagnose MRSA infection nor to guide or monitor treatment for MRSA infections. Test performance is not FDA approved in patients less than 1 years old. Performed at [facility], [place] [address], [place], [place] [address]          Radiology  Studies: No results found.      Scheduled Meds:  amiodarone  200 mg Oral BID   Followed by   Derrill Memo ON 09/25/2022] amiodarone  200 mg Oral Daily   apixaban  5 mg Oral BID   atorvastatin  40 mg Oral Daily   Chlorhexidine Gluconate Cloth  6 each Topical Daily   diltiazem  120 mg Oral Daily   dronabinol  2.5 mg Oral BID AC   DULoxetine  60 mg Oral BID   feeding supplement  237 mL Oral BID BM   fluticasone  2 spray Each Nare Daily   furosemide  40 mg Intravenous Daily   guaiFENesin  600 mg Oral BID   insulin aspart  0-6 Units Subcutaneous TID WC   labetalol  100 mg Oral BID   magnesium oxide  400 mg Oral BID   mometasone-formoterol  2 puff Inhalation BID   pantoprazole  40 mg Oral Daily   polyethylene glycol  17 g Oral BID   senna-docusate  1 tablet Oral BID   sodium chloride flush  10-40 mL Intracatheter Q12H   Continuous Infusions:  sodium chloride Stopped (09/11/22 1000)   sodium chloride Stopped (09/12/22 0852)     LOS: 15 days    Time spent: 35 minutes    Bonnell Public, MD [facility]   If 7PM-7AM, please contact night-coverage [email or web address]  09/13/2022, 10:51 AM

## 2022-09-13 NOTE — TOC Progression Note (Signed)
Transition of Care [facility]) - Progression Note   Patient Details  Name: Rhonda Blackwell MRN: [id number] Date of Birth: September 22, 1950  Transition of Care [facility]) CM/SW [place], LCSW Phone Number: 09/13/2022, 2:48 PM  Clinical Narrative: CSW spoke with patient's spouse regarding bed offers. Spouse chose [facility] as the facility is closer for him to visit the patient. CSW confirmed bed with [facility] in admissions. TOC awaiting patient to be medically ready for discharge.  Expected Discharge Plan: [place] Barriers to Discharge: Continued Medical Work up  Expected Discharge Plan and Services Living arrangements for the past 2 months: Single Family Home            DME Arranged: N/A DME Agency: NA  Social Determinants of Health (SDOH) Interventions SDOH Screenings   Food Insecurity: No Food Insecurity (08/30/2022)  Housing: Low Risk  (08/30/2022)  Transportation Needs: No Transportation Needs (08/30/2022)  Utilities: Not At Risk (08/30/2022)  Tobacco Use: High Risk (09/08/2022)   Readmission Risk Interventions    07/28/2022    2:35 PM  Readmission Risk Prevention Plan  Transportation Screening Complete  PCP or Specialist Appt within 5-7 Days Complete  Home Care Screening Complete  Medication Review (RN CM) Complete

## 2022-09-13 NOTE — Progress Notes (Signed)
Rhonda Blackwell seems to be doing pretty well.  She had a good day yesterday.  She is ready for her next cycle of chemotherapy.  I think she will finish up radiation therapy today.  She is not wearing any oxygen.  She is eating okay.  There is no nausea or vomiting.  She has had no problems with diarrhea.  She got out of bed little bit yesterday.  I think the plan is still for her to go to some kind of Rehab so that she can get stronger.  I am still awaiting labs for today.  She may have a little bit of a cough.  It is productive of clearish mucus.  She has had no bleeding.  She has had no problems with atrial fibrillation.  She still is in sinus rhythm on the monitor.  There is been no problems with bowels or bladder.  I do not think that there is been any diarrhea.   Her vital signs show temperature of 97.8.  Pulse 73.  Blood pressure is 118/51.  Her lungs sound clear over on the right side.  There is still is a little bit of decreased on the left side.  Cardiac exam regular rate and rhythm.  Abdominal exam soft.  She has good bowel sounds.  There is no fluid wave.  There is no palpable liver or spleen tip.  Extremity shows no clubbing, cyanosis or edema.  Neurological exam shows no focal neurological deficits.  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  She is getting radiation therapy with low-dose chemotherapy just  to try to help open up the left lung secondary to obstruction from malignancy.  We will go ahead with her second dose of chemotherapy.  She will get carboplatin/Taxol.  I will think that her labs will be okay to give chemotherapy.  After this cycle, we will then give her a couple week break.  We will let the radiation therapy work.  Hopefully, she will get stronger in Rehab.  We will then plan to get her into the office and then I will see about doing full dose chemotherapy along with immunotherapy.   Again, no she is getting incredible care from everybody in  the ICU.  Rhonda Haw, MD  [place] [address]:  6-9

## 2022-09-14 ENCOUNTER — Other Ambulatory Visit: Payer: Self-pay

## 2022-09-14 ENCOUNTER — Ambulatory Visit: Payer: Medicare Other

## 2022-09-14 DIAGNOSIS — J9621 Acute and chronic respiratory failure with hypoxia: Secondary | ICD-10-CM | POA: Diagnosis not present

## 2022-09-14 LAB — GLUCOSE, CAPILLARY
Glucose-Capillary: 156 mg/dL — ABNORMAL HIGH (ref 70–99)
Glucose-Capillary: 192 mg/dL — ABNORMAL HIGH (ref 70–99)
Glucose-Capillary: 276 mg/dL — ABNORMAL HIGH (ref 70–99)
Glucose-Capillary: 118 mg/dL — ABNORMAL HIGH (ref 70–99)

## 2022-09-14 LAB — CBC WITH DIFFERENTIAL/PLATELET
Abs Immature Granulocytes: 0.06 10*3/uL (ref 0.00–0.07)
Basophils Absolute: 0 10*3/uL (ref 0.0–0.1)
Basophils Relative: 0 %
Eosinophils Absolute: 0.1 10*3/uL (ref 0.0–0.5)
Eosinophils Relative: 1 %
HCT: 33.3 % — ABNORMAL LOW (ref 36.0–46.0)
Hemoglobin: 10.5 g/dL — ABNORMAL LOW (ref 12.0–15.0)
Immature Granulocytes: 1 %
Lymphocytes Relative: 8 %
Lymphs Abs: 0.8 10*3/uL (ref 0.7–4.0)
MCH: 24.6 pg — ABNORMAL LOW (ref 26.0–34.0)
MCHC: 31.5 g/dL (ref 30.0–36.0)
MCV: 78 fL — ABNORMAL LOW (ref 80.0–100.0)
Monocytes Absolute: 0.9 10*3/uL (ref 0.1–1.0)
Monocytes Relative: 8 %
Neutro Abs: 9 10*3/uL — ABNORMAL HIGH (ref 1.7–7.7)
Neutrophils Relative %: 82 %
Platelets: 595 10*3/uL — ABNORMAL HIGH (ref 150–400)
RBC: 4.27 MIL/uL (ref 3.87–5.11)
RDW: 14.4 % (ref 11.5–15.5)
WBC: 10.9 10*3/uL — ABNORMAL HIGH (ref 4.0–10.5)
nRBC: 0 % (ref 0.0–0.2)

## 2022-09-14 LAB — COMPREHENSIVE METABOLIC PANEL
ALT: 14 U/L (ref 0–44)
AST: 13 U/L — ABNORMAL LOW (ref 15–41)
Albumin: 2.9 g/dL — ABNORMAL LOW (ref 3.5–5.0)
Alkaline Phosphatase: 85 U/L (ref 38–126)
Anion gap: 9 (ref 5–15)
BUN: 18 mg/dL (ref 8–23)
CO2: 24 mmol/L (ref 22–32)
Calcium: 8.4 mg/dL — ABNORMAL LOW (ref 8.9–10.3)
Chloride: 97 mmol/L — ABNORMAL LOW (ref 98–111)
Creatinine, Ser: 0.85 mg/dL (ref 0.44–1.00)
GFR, Estimated: >60 mL/min (ref >60)
Glucose, Bld: 176 mg/dL — ABNORMAL HIGH (ref 70–99)
Potassium: 3.9 mmol/L (ref 3.5–5.1)
Sodium: 130 mmol/L — ABNORMAL LOW (ref 135–145)
Total Bilirubin: 0.7 mg/dL (ref 0.3–1.2)
Total Protein: 6.9 g/dL (ref 6.5–8.1)

## 2022-09-14 MED ORDER — SODIUM CHLORIDE 0.9 % IV SOLN
50.0000 mg/m2 | Freq: Once | INTRAVENOUS | Status: AC
  Administered 2022-09-14: 84 mg via INTRAVENOUS
  Filled 2022-09-14: qty 14

## 2022-09-14 MED ORDER — BOOST / RESOURCE BREEZE PO LIQD CUSTOM
1.0000 | Freq: Two times a day (BID) | ORAL | Status: DC
  Administered 2022-09-14 (×2): 1 via ORAL

## 2022-09-14 MED ORDER — AMIODARONE HCL 200 MG PO TABS: 200.0000 mg | ORAL_TABLET | Freq: Every day | ORAL | 0 refills | Status: AC

## 2022-09-14 MED ORDER — DRONABINOL 2.5 MG PO CAPS: 2.5000 mg | ORAL_CAPSULE | Freq: Two times a day (BID) | ORAL | 0 refills | Status: AC

## 2022-09-14 MED ORDER — GUAIFENESIN ER 600 MG PO TB12: 600.0000 mg | ORAL_TABLET | Freq: Two times a day (BID) | ORAL | 0 refills | Status: AC

## 2022-09-14 MED ORDER — SODIUM CHLORIDE 0.9 % IV SOLN
158.4000 mg | Freq: Once | INTRAVENOUS | Status: AC
  Administered 2022-09-14: 160 mg via INTRAVENOUS
  Filled 2022-09-14: qty 16

## 2022-09-14 MED ORDER — AMIODARONE HCL 200 MG PO TABS: 200.0000 mg | ORAL_TABLET | Freq: Every evening | ORAL | 0 refills | Status: DC

## 2022-09-14 MED ORDER — METOPROLOL TARTRATE 5 MG/5ML IV SOLN: 2.5000 mg | INTRAVENOUS | Status: DC | PRN

## 2022-09-14 MED ORDER — INSULIN ASPART 100 UNIT/ML IJ SOLN: 0.0000 [IU] | Freq: Three times a day (TID) | INTRAMUSCULAR | 11 refills | Status: DC

## 2022-09-14 MED ORDER — DILTIAZEM HCL 25 MG/5ML IV SOLN
10.0000 mg | Freq: Once | INTRAVENOUS | Status: AC
  Administered 2022-09-14: 10 mg via INTRAVENOUS
  Filled 2022-09-14: qty 5

## 2022-09-14 MED ORDER — POLYETHYLENE GLYCOL 3350 17 G PO PACK: 17.0000 g | PACK | Freq: Two times a day (BID) | ORAL | 0 refills | Status: DC

## 2022-09-14 MED ORDER — FUROSEMIDE 10 MG/ML IJ SOLN: 40.0000 mg | Freq: Every day | INTRAMUSCULAR | 0 refills | Status: DC

## 2022-09-14 MED ORDER — MAGNESIUM OXIDE -MG SUPPLEMENT 400 (240 MG) MG PO TABS: 400.0000 mg | ORAL_TABLET | Freq: Two times a day (BID) | ORAL | 0 refills | Status: AC

## 2022-09-14 MED ORDER — METOPROLOL TARTRATE 5 MG/5ML IV SOLN
2.5000 mg | INTRAVENOUS | Status: AC | PRN
  Administered 2022-09-14 (×2): 2.5 mg via INTRAVENOUS
  Filled 2022-09-14: qty 5

## 2022-09-14 MED ORDER — BISACODYL 5 MG PO TBEC: 5.0000 mg | DELAYED_RELEASE_TABLET | Freq: Every day | ORAL | 0 refills | Status: DC | PRN

## 2022-09-14 MED ORDER — SENNOSIDES-DOCUSATE SODIUM 8.6-50 MG PO TABS: 1.0000 | ORAL_TABLET | Freq: Two times a day (BID) | ORAL | 0 refills | Status: AC

## 2022-09-14 NOTE — Progress Notes (Signed)
    Patient Name: Rhonda Blackwell           DOB: May 19, 1951  MRN: [id number]      Admission Date: 08/29/2022  Attending Provider: Dana Allan I, MD  Primary Diagnosis: Acute on chronic respiratory failure with hypoxia [facility])   Level of care: Stepdown    CROSS COVER NOTE   Date of Service   09/14/2022   [name], 72 y.o. female, was admitted on 08/29/2022 for Acute on chronic respiratory failure with hypoxia ([place]).    HPI/Events of Note   Bedside RN reports A-fib RVR, HR 90-140s.  Asymptomatic. Despite amiodarone and prn metoprolol administration, HR is still sustaining > 130 bpm.    Patient required an additional dose of IV metoprolol, as well as IV Cardizem push to help with rate control.  Interventions/ Plan   IV metoprolol-additional 5 mg IV Cardizem-10 mg        Raenette Rover, DNP, [place]

## 2022-09-14 NOTE — Progress Notes (Signed)
Patient in a fib @ 2230 HR sustaining in 130-160's. Abigail Butts NP notified. Hold PO labetalol and administer PRN IV metoprolol.  2235 okay to administer PO amiodarone SBP > 120.  0020 Heart rate sustaining  > 120's with SBP > 120. New order for Metoprolol 2.5 mg given.  0053 Heart rate sustaining > 120's with SBP > 120. Administered 2nd dose of Metoprolol 2.5 mg.  0125 HR sustaining > 120-140's with SBP > 120. New order for cardiazem 10 mg IV. Per A. Chavez NP, give 5 mg slow and monitor BP, if BP remain stable give the other 5 mg. EKG done and showing patient is in a.fib RVR.   0245 Patient is now sinus rhythm.

## 2022-09-14 NOTE — Progress Notes (Signed)
Unfortunately, Rhonda Blackwell had a bout of atrial fibrillation last night.  She is back in normal sinus rhythm.  I know that the cardiologist and the hospitalist are doing a great job with this.  She finished her radiation therapy.  She did well with this.  She will have her second cycle of chemotherapy today.  I think she is ready for this.  She really had no problems with the first cycle of chemotherapy.  Then again, it was low-dose.  Her labs today show sodium 130.  Potassium 3.9.  BUN 18 creatinine 0.85.  Blood sugar is 176.  Calcium is 8.4 with an albumin of 2.9.  Her white cell count is 10.9.  Hemoglobin 10.5.  Platelet count 595,000.  She is eating okay.  There is no nausea or vomiting.  She has had no diarrhea.  She has not complained of any pain.  She says that she does not have any obvious shortness of breath.   Her vital signs are stable with a temperature of 97.8.  Pulse 76.  Blood pressure 130/65.  Oxygen saturation is 98%.  Her lungs sound clear on the right side.  There is still decreased on the left side.  She has occasional rhonchi on the left side.  Cardiac exam regular rate and rhythm.  She has no murmurs.  Abdomen is soft.  Bowel sounds are present.  There is no guarding or rebound tenderness.  Extremity shows no clubbing, cyanosis or edema.  Neurological exam is nonfocal.   [name] has metastatic adenocarcinoma of the lung.  She was getting radiation therapy with low-dose chemotherapy to try to help open of the obstruction on the left side.  Hopefully, we will see results.  She will go ahead and get a second cycle of low-dose carboplatinum/Taxol.  This is to try to help make the radiation more effective.  I think that she is going to go to [facility].  I am not sure when she will be going.  I would give her about a 2-week break from treatment and then get her back into the office and then go with full dose chemotherapy along with immunotherapy.  Hopefully, the  atrial fibrillation is not going to be a problem.   [name], MD  Lurena Joiner [phone]

## 2022-09-14 NOTE — Discharge Summary (Signed)
Physician Discharge Summary  Patient ID: Rhonda Blackwell MRN: [id number] DOB/AGE: 11/09/1950 72 y.o.  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  Patient presented to the hospital with worsening shortness of breath.  Of note, patient was admitted recently from 07/23/2022 to 07/27/2022 with sepsis secondary to multifocal pneumonia.  Patient was discharged on 2 L of oxygen.  She was also recently diagnosed with A-fib.  Since her diagnosis she has been having shortness of breath and feeling of unwell.  Patient has a history of hypermetabolic left upper lobe nodule as well as hypermetabolic hilar and mediastinal lymph nodes, left scapular lesion, area of right acetabulum and rectum concerning for metastatic disease on PET scan done on 04/2022.  She was followed by [facility] pulmonology/oncology, underwent biopsy of left scapular lesion 06/08/2022 and bronchoscopy EBUS on 07/04/2022, but did not follow-up with oncology and multiple  appointments.   Patient was admitted with A-fib RVR and respiratory failure requiring 8 L of high flow oxygen.  CT chest showed masslike consolidation in the left upper lobe of the lung, abutting the left hilar and, concerning for central obstructing neoplasm. peripheral areas of consolidation and interlobar septal thickening throughout the left upper lobe concerning for combination of postobstructive changes, regional metastatic disease and lymphangitic spread of tumor. Mediastinal and hilar adenopathy consistent with nodule metastasis. 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    Patient underwent thoracentesis and subsequently chest  tube placement left pleural space on 09/05/2022.  Patient has completed radiation therapy.  Patient started first cycle of chemotherapy on 09/06/2022.  Second cycle of chemotherapy was started today.  Chest tube  was removed 09/07/2022 after chemical pleurodesis.  Patient underwent flexible sigmoidoscopy on 09/08/2022 due to abnormal rectal area noted on PET scan.  Sigmoidoscopy came back normal.  Atrial fibrillation is currently being monitored and managed expectantly.  Oncology team has cleared patient for discharge.    1-Acute on Chronic Hypoxic Respiratory failure secondary to Left-sided Pleural Effusion, Lung mass: Flash Pulmonary edema, lung reexpansion.  -Acute on chronic respiratory failure likely multifactorial secondary to lung mass, postobstructive pneumonia, large left pleural effusion. -Patient oxygen requirement increased up to 15 L.  CCM consulted and underwent thoracentesis 3/17 yielding 800 cc of fluids. -CT chest showed masslike consolidation left upper lobe abutting left hilar concerning for central obstructing neoplasm. -On IV antibiotics to cover for postobstructive pneumonia. Day 12 of antibiotics.  -Oncology following and concern is for adenocarcinoma of the lung. -Chest x-ray showed reaccumulation  of pleural  fluid.  Underwent left side pigtail catheter placement by CCM on 3/19. Underwent Chemical Pleurodesis via chest tube 3/20. -Continue with IV lasix.  -Chest tube removed 3/21. Stable to 3 L oxygen.  Repeated chest x ray 3/23; appears to have re- accumulation of fluid. CCM consulted.  Chest x ray 3/25; improved aeration left lung.  09/13/2022: Patient is stable.   2-Metastatic cancer suspect primary Lung. Lung Mass. Left Pleural; effusion.  -Bone scan: Multifocal skeletal metastases involving the spine, ribs, left scapula and minimal involvement of the left hemipelvis. -CT head, neck: Multiple calvarial metastasis, largest located in the right parietal bone with transcortical extension. -Patient evaluated by radiation oncology, patient was a started on radiation treatments.  She will need to complete 2-week course of treatment.  -Dr. Marin Olp following. -Port cath placement 3/18 by IR -Started  chemotherapy 3/20. She will need Chemo on 3/27 -Pleural fluid cytology: Malignant cell presents, poorly differentiated carcinoma. Likely adenocarcinoma.  -Palliative care consulted for support and discussion of goals of care, Code status.  09/13/2022: Patient is currently undergoing radiation therapy.   3-Hypokalemia; Replaced.  09/13/2022: Potassium is 3.7 today.   4-Small acute infarct left frontal white matter, prior recent history of CVA -MRI done on 09/02/2022 for staging show a small acute infarct in the left frontal white matter.  Chronic small vessel ischemia with multiple chronic lacunar infarct.  Multiple calvarial metastasis. -CT angio head and neck no large vessel occlusion. -Had a recent 2D echo.  This has not been repeated. -LDL: 51.  A1c 6.3. -Continue with the statins. -Transition to Eliquis.    5-A-fib RVR:  Intermittent episode of A-fib RVR.  Intermittently on Cardizem drip. On oral Labetalol.  3-23: HR jump to 180  A fib-- BP 100. 5 Mg IV metoprolol. HR down to 120--130.  SBP 112.  Treated with IV amiodarone, transition to oral.  On Cardizem 120 mg daily  On Eliquis.  Appreciate cardiology assistance.  Transition to oral Amiodarone 3/25. Needs dose reduction in 2 weeks.  Had episode of A fib RVR 3/25, responded to IV metoprolol.  Monitor.  09/13/2022: Patient is currently on amiodarone, Eliquis and IV Lopressor as needed.  Heart rate is controlled.   6-Hypomagnesemia, hypophosphatemia and hypocalcemia Replaced.    Hypertension: On Cardizem, Avapro, labetalol.    Tobacco use: Cessation encouraged   Diabetes type 2: A1c 6.3 Sliding scale insulin   Chronic anxiety: Continue with Cymbalta.   Hypermetabolic activity in the rectum seen on PET scan: CEA elevated 1400 Underwent sigmoidoscopy: negative.    Hyponatremia:  -Chronic. -Sodium is 131 today.     Estimated body mass index is 25.16 kg/m as calculated from the following:   Height as of this encounter: 5\' 4"  (1.626 m).   Weight as of this encounter: 66.5 kg.     Consults: hematology/oncology  Significant Diagnostic Studies:  Flexible sigmoidoscopy: Normal. Thoracentesis: Left thoracentesis.  800 cc of pleural fluid taken out. Pleural fluid cytology: Malignant cells noted.  CT chest with contrast revealed: 1. Masslike consolidation within the left upper lobe abutting the left hilum, concerning for central obstructing neoplasm. 2. Peripheral areas of consolidation and interlobular septal thickening throughout the left upper lobe, concerning for a combination of postobstructive changes, regional metastatic disease, and lymphangitic spread of tumor. 3. Mediastinal and hilar adenopathy consistent with nodal metastases. 4. Diffuse lytic bony metastases as above. 5. Small bilateral pleural effusions, left greater than right. 6. Dilated main pulmonary artery consistent with pulmonary arterial hypertension. 7. Trace pericardial  effusion. 8. Aortic Atherosclerosis (ICD10-I70.0) and  Emphysema (ICD10-J43.9).  CT angio head and neck with and without contrast revealed: 1. No emergent large vessel occlusion or hemodynamically significant stenosis of the head or neck. 2. 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 3. Masslike consolidation in the left upper lobe, as characterized on recent chest CT. 4. Bilateral pleural effusions.   Aortic atherosclerosis (ICD10-I70.0).   Treatments:  Radiation therapy, chemotherapy and thoracentesis.  Patient is also status post pleurodesis.  Discharge Exam: Blood pressure (!) 151/82, pulse 81, temperature 97.9 F (36.6 C), temperature source Oral, resp. rate (!) 23, height 5\' 4"  (1.626 m), weight 66.5 kg, SpO2 99 %.   Disposition: Discharge disposition: 03-Skilled Nursing Facility       Discharge Instructions     Amb Referral to Palliative Care   Complete by: As directed    Clinic Appointment Request   Complete by: Sep 14, 2022    Contact your oncology clinic or infusion center to schedule this appointment.   Diet - low sodium heart healthy   Complete by: As directed    Increase activity slowly   Complete by: As directed    Infusion Appointment Request   Complete by: Sep 14, 2022    Contact your oncology clinic or infusion center to schedule this appointment.   Lab Appointment Request   Complete by: Sep 14, 2022    Contact your oncology clinic or infusion center to schedule this appointment.   TREATMENT CONDITIONS   Complete by: As directed    Patient should have CBC & CMP within 7 days prior to chemotherapy administration. NOTIFY MD IF: ANC < 1500, Hemoglobin < 8, PLT < 100,000,  Total Bili > 1.5, Creatinine > 1.5, ALT or  AST > 80 or if patient has unstable vital signs: Temperature >= 100.32F, SBP > 180 or < 90, RR > 30 or HR > 100.   TREATMENT CONDITIONS   Complete by: As directed    Patient should have CBC & CMP within 7 days prior to chemotherapy  administration. NOTIFY MD IF: ANC < 1500, Hemoglobin < 8, PLT < 100,000,  Total Bili > 1.5, Creatinine > 1.5, ALT or  AST > 80 or if patient has unstable vital signs: Temperature >= 100.32F, SBP > 180 or < 90, RR > 30 or HR > 100.      Allergies as of 09/14/2022       Reactions   Bystolic [nebivolol Hcl] Nausea Only, Other (See Comments)   Abdominal pain   Hydrochlorothiazide Nausea Only, Swelling   Leg swelling   Norvasc [amlodipine] Swelling   Leg swelling        Medication List     STOP taking these medications    azithromycin 250 MG tablet Commonly known as: ZITHROMAX   calcium carbonate 1500 (600 Ca) MG Tabs tablet Commonly known as: OSCAL   furosemide 20 MG tablet Commonly known as: LASIX Replaced by: furosemide 10 MG/ML injection   irbesartan 150 MG tablet Commonly known as: AVAPRO   sitaGLIPtin-metformin 50-500 MG tablet Commonly known as: JANUMET       TAKE these medications    acetaminophen 650 MG CR tablet Commonly known as: TYLENOL Take 650 mg by mouth every 8 (eight) hours as needed for pain.   albuterol 108 (90 Base) MCG/ACT inhaler Commonly known as: VENTOLIN HFA Inhale 2 puffs into the lungs every 6 (six) hours as needed for shortness of breath (cough).  amiodarone 200 MG tablet Commonly known as: PACERONE Take 1 tablet (200 mg total) by mouth every evening for 10 days.   amiodarone 200 MG tablet Commonly known as: PACERONE Take 1 tablet (200 mg total) by mouth daily. Start taking on: September 25, 2022   atorvastatin 40 MG tablet Commonly known as: LIPITOR Take 40 mg by mouth daily.   bisacodyl 5 MG EC tablet Commonly known as: DULCOLAX Take 1 tablet (5 mg total) by mouth daily as needed for moderate constipation.   Cartia XT 120 MG 24 hr capsule Generic drug: diltiazem Take 1 capsule (120 mg total) by mouth daily.   dronabinol 2.5 MG capsule Commonly known as: MARINOL Take 1 capsule (2.5 mg total) by mouth 2 (two) times daily  before lunch and supper.   DULoxetine 60 MG capsule Commonly known as: CYMBALTA Take 60 mg by mouth 2 (two) times daily.   Eliquis 5 MG Tabs tablet Generic drug: apixaban Take 1 tablet (5 mg total) by mouth 2 (two) times daily.   fluticasone 50 MCG/ACT nasal spray Commonly known as: FLONASE Place 2 sprays into both nostrils daily.   fluticasone-salmeterol 250-50 MCG/ACT Aepb Commonly known as: ADVAIR Inhale 1 puff into the lungs 2 (two) times daily as needed (wheezing, shortness of breath.).   furosemide 10 MG/ML injection Commonly known as: LASIX Inject 4 mLs (40 mg total) into the vein daily. Start taking on: September 15, 2022 Replaces: furosemide 20 MG tablet   guaiFENesin 600 MG 12 hr tablet Commonly known as: MUCINEX Take 1 tablet (600 mg total) by mouth 2 (two) times daily for 10 days.   insulin aspart 100 UNIT/ML injection Commonly known as: novoLOG Inject 0-6 Units into the skin 3 (three) times daily with meals.   labetalol 100 MG tablet Commonly known as: NORMODYNE Take 100 mg by mouth 2 (two) times daily.   magnesium oxide 400 (240 Mg) MG tablet Commonly known as: MAG-OX Take 1 tablet (400 mg total) by mouth 2 (two) times daily for 7 days.   pantoprazole 40 MG tablet Commonly known as: PROTONIX Take 40 mg by mouth daily.   polyethylene glycol 17 g packet Commonly known as: MIRALAX / GLYCOLAX Take 17 g by mouth 2 (two) times daily.   senna-docusate 8.6-50 MG tablet Commonly known as: Senokot-S Take 1 tablet by mouth 2 (two) times daily.        Contact information for after-discharge care     Destination     [facility] Preferred SNF .   Service: Skilled Nursing Contact information: [address] [place] [phone]                     Time spent: 40 minutes.  SignedBonnell Public 09/14/2022, 2:45 PM

## 2022-09-14 NOTE — Progress Notes (Signed)
Taxol and Carboplatin administered on 09/14/2022 were verified by Adella Hare RN and Marilynne Drivers RN prior to administration, for accurate patient, dose, route, contents, tubing, presence or absence of a filter, expiration date and expiration time.

## 2022-09-14 NOTE — Progress Notes (Signed)
Initial Nutrition Assessment  DOCUMENTATION CODES:   Non-severe (moderate) malnutrition in context of chronic illness  INTERVENTION:  - Regular diet.  - Boost Breeze po BID, each supplement provides 250 kcal and 9 grams of protein - Encourage intake as tolerated. - Continue Marinol for appetite stimulation as medically appropriate. - Monitor weight trends.    NUTRITION DIAGNOSIS:   Moderate Malnutrition related to chronic illness (metastatic disease with suspected lung primary) as evidenced by mild fat depletion, moderate muscle depletion, percent weight loss (5% in 1 month).  GOAL:   Patient will meet greater than or equal to 90% of their needs  MONITOR:   PO intake, Weight trends, Supplement acceptance  REASON FOR ASSESSMENT:   Malnutrition Screening Tool    ASSESSMENT:   72 year old with PMH of COPD, DM2, CVA, HTN, A-fib, metastatic disease with suspected lung primary who presented with worsening shortness of breath. Admitted for acute on chronic respiratory failure. Starting chemotherapy during admission.   Patient endorses a UBW of around 148# and denied recent changes in weight.  However, per EMR patient weighed at 153# in early February and weighed at 145# at beginning of this admission. This is an 8# or 5% weight loss in 1 month, which is significant for the time frame. There has been no weight since 3/18 (10 days ago) to assess for changes since that time.   Patient endorses that prior to admission, she usually ate around 2 meals a day. Eats a lot of fruits and vegetables but not a lot of meat. Mostly fish but patient admits she doesn't eat it every day.  Patient shares that she has been eating well since admission and has a good appetite. She is documented to be consuming an average of 57% of meals in the past week. Drinking around 1 Ensure a day (ordered 2). Patient admits she doesn't really like it. Agreeable to try Boost Breeze instead.    Medications reviewed  and include: Marinol, Lasix, Insulin, Magnesium BID, Miralax, Senokot  Labs reviewed:  Na 130 HA1C 6.3 Blood Glucose 127-190 x24 hours   NUTRITION - FOCUSED PHYSICAL EXAM:  Flowsheet Row Most Recent Value  Orbital Region Moderate depletion  Upper Arm Region Moderate depletion  Thoracic and Lumbar Region Mild depletion  Buccal Region Mild depletion  Temple Region Severe depletion  Clavicle Bone Region Mild depletion  Clavicle and Acromion Bone Region Moderate depletion  Scapular Bone Region Unable to assess  Dorsal Hand Mild depletion  Patellar Region Mild depletion  Anterior Thigh Region Mild depletion  Posterior Calf Region Mild depletion  Edema (RD Assessment) None  Hair Reviewed  Eyes Reviewed  Mouth Reviewed  Skin Reviewed  Nails Reviewed       Diet Order:   Diet Order             Diet regular Fluid consistency: Thin  Diet effective now                   EDUCATION NEEDS:  Education needs have been addressed  Skin:  Skin Assessment: Reviewed RN Assessment  Last BM:  3/26  Height:  Ht Readings from Last 1 Encounters:  09/03/22 5\' 4"  (1.626 m)   Weight:  Wt Readings from Last 1 Encounters:  09/04/22 66.5 kg    BMI:  Body mass index is 25.16 kg/m.  Estimated Nutritional Needs:  Kcal:  2000-2200 kcals Protein:  80-100 grams Fluid:  >/= 2L    Samson Frederic RD, LDN For contact information, refer  to [facility].

## 2022-09-14 NOTE — TOC Transition Note (Addendum)
Transition of Care [facility]) - CM/SW Discharge Note   Patient Details  Name: Rhonda Blackwell MRN: [id number] Date of Birth: October 17, 1950  [facility] CM/SW Contact:  Henrietta Dine, RN Phone Number: 09/14/2022, 2:57 PM   Clinical Narrative:    D/C orders received; bed accepted at [facility]; pt and husband agree to d/c plan; d/c summary and SNF transfer report sent via SNF Hub; Kia at facility notified; awaiting RM # and call report #.  [phone] - spoke w/ Kia at facility; given RM# 121, call report # [phone]; PTAR called at 1532; spoke w/ [place]; no TOC needs.   Final next level of care: [place] Barriers to Discharge: No Barriers Identified   Patient Goals and CMS Choice      Discharge Placement                  Patient to be transferred to facility by: [place] Name of family member notified: Vernella Student (husband) [phone] Patient and family notified of of transfer: 09/14/22  Discharge Plan and Services Additional resources added to the After Visit Summary for                  DME Arranged: N/A DME Agency: NA                  Social Determinants of Health ([place]) Interventions [place]: No Food Insecurity (08/30/2022)  Housing: [place]  (08/30/2022)  Transportation Needs: No Transportation Needs (08/30/2022)  Utilities: Not At Risk (08/30/2022)  Tobacco Use: High Risk (09/08/2022)     Readmission Risk Interventions    07/28/2022    2:35 PM  Readmission Risk Prevention Plan  Transportation Screening Complete  PCP or Specialist Appt within 5-7 Days Complete  Home Care Screening Complete  Medication Review (RN CM) Complete

## 2022-09-14 NOTE — Progress Notes (Signed)
Patient received her second cycle of chemo today. She tolerated treatment well with no issues. Vital signs stable through out treatment. No issues at this time.

## 2022-09-14 NOTE — Progress Notes (Signed)
PT Cancellation Note  Patient Details Name: Rhonda Blackwell MRN: [id number] DOB: 1951-01-16   Cancelled Treatment:    Reason Eval/Treat Not Completed: Patient at procedure or test/unavailable, receiving Chemo. Will check back another time. [place] Office [phone] Weekend [id number]    Claretha Cooper 09/14/2022, 9:16 AM

## 2022-09-18 ENCOUNTER — Encounter: Payer: Self-pay | Admitting: Hematology & Oncology

## 2022-09-18 ENCOUNTER — Other Ambulatory Visit: Payer: Self-pay | Admitting: Hematology & Oncology

## 2022-09-18 DIAGNOSIS — C3492 Malignant neoplasm of unspecified part of left bronchus or lung: Secondary | ICD-10-CM

## 2022-09-18 NOTE — Progress Notes (Signed)
DISCONTINUE OFF PATHWAY REGIMEN - Non-Small Cell Lung   [id number]:Carboplatin + Paclitaxel (2/50) + RT weekly x 6 weeks:   Administer weekly during RT:     Paclitaxel      Carboplatin   **Always confirm dose/schedule in your pharmacy ordering system**  REASON: Other Reason PRIOR TREATMENT: Off Pathway: Carboplatin + Paclitaxel (2/50) + RT weekly x 6 weeks TREATMENT RESPONSE: Unable to Evaluate  START OFF PATHWAY REGIMEN - Non-Small Cell Lung   [id number]:Atezolizumab 1,200 mg IV + Bevacizumab 15 mg/kg IV + Carboplatin AUC=6 IV + Paclitaxel 200 mg/m2 IV q21 Days x 4-6 Cycles:   A cycle is every 21 days:     Atezolizumab      Bevacizumab-xxxx      Paclitaxel      Carboplatin   **Always confirm dose/schedule in your pharmacy ordering system**  Patient Characteristics: Stage IV Metastatic, Nonsquamous, Awaiting Molecular Test Results and Need to Start Chemotherapy, PS = 0, 1 Therapeutic Status: Stage IV Metastatic Histology: Nonsquamous Cell Broad Molecular Profiling Status: Awaiting Molecular Test Results and Need to Start Chemotherapy ECOG Performance Status: 1 Intent of Therapy: Non-Curative / Palliative Intent, Discussed with Patient

## 2022-09-19 ENCOUNTER — Other Ambulatory Visit: Payer: Self-pay | Admitting: Hematology & Oncology

## 2022-09-19 ENCOUNTER — Encounter: Payer: Self-pay | Admitting: *Deleted

## 2022-09-19 ENCOUNTER — Other Ambulatory Visit: Payer: Self-pay

## 2022-09-19 DIAGNOSIS — C3492 Malignant neoplasm of unspecified part of left bronchus or lung: Secondary | ICD-10-CM

## 2022-09-19 NOTE — Progress Notes (Signed)
Patient was seen, diagnosed, staged and initiated treatment in the hospital. She was discharged last week and Dr Marin Olp would like her to come into the office on 10/02/2022 for follow up and to continue with treatment. Message sent to scheduling and pharmacy.   Referrals to social work and nutrition placed per protocol.  Oncology Nurse Navigator Documentation     09/19/2022   10:00 AM  Oncology Nurse Navigator Flowsheets  Abnormal Finding Date 08/29/2022  Confirmed Diagnosis Date 09/03/2022  Phase of Treatment Chemo  Chemotherapy Actual Start Date: 09/06/2022  Navigator Follow Up Date: 10/02/2022  Navigator Follow Up Reason: New Patient Appointment  Navigator Location [facility]  Navigator Encounter Type Appt/Treatment Plan Review  Treatment Initiated Date 09/06/2022  Patient Visit Type MedOnc  Treatment Phase Active Tx  Barriers/Navigation Needs Coordination of Care  Interventions Coordination of Care;Referrals  Acuity Level 2-Minimal Needs (1-2 Barriers Identified)  Referrals Nutrition/dietician;Social Work  Coordination of Care Appts  Time Spent with Patient 30

## 2022-09-20 ENCOUNTER — Telehealth: Payer: Self-pay | Admitting: Dietician

## 2022-09-20 ENCOUNTER — Inpatient Hospital Stay: Payer: Medicare Other | Attending: Hematology & Oncology | Admitting: Licensed Clinical Social Worker

## 2022-09-20 ENCOUNTER — Encounter: Admitting: Dietician

## 2022-09-20 DIAGNOSIS — C7951 Secondary malignant neoplasm of bone: Secondary | ICD-10-CM | POA: Insufficient documentation

## 2022-09-20 DIAGNOSIS — Z5112 Encounter for antineoplastic immunotherapy: Secondary | ICD-10-CM | POA: Insufficient documentation

## 2022-09-20 DIAGNOSIS — Z79630 Long term (current) use of alkylating agent: Secondary | ICD-10-CM | POA: Insufficient documentation

## 2022-09-20 DIAGNOSIS — Z7901 Long term (current) use of anticoagulants: Secondary | ICD-10-CM | POA: Insufficient documentation

## 2022-09-20 DIAGNOSIS — Z79620 Long term (current) use of immunosuppressive biologic: Secondary | ICD-10-CM | POA: Insufficient documentation

## 2022-09-20 DIAGNOSIS — Z5111 Encounter for antineoplastic chemotherapy: Secondary | ICD-10-CM | POA: Insufficient documentation

## 2022-09-20 DIAGNOSIS — J90 Pleural effusion, not elsewhere classified: Secondary | ICD-10-CM | POA: Insufficient documentation

## 2022-09-20 DIAGNOSIS — Z923 Personal history of irradiation: Secondary | ICD-10-CM | POA: Insufficient documentation

## 2022-09-20 DIAGNOSIS — I4891 Unspecified atrial fibrillation: Secondary | ICD-10-CM | POA: Insufficient documentation

## 2022-09-20 DIAGNOSIS — Z888 Allergy status to other drugs, medicaments and biological substances status: Secondary | ICD-10-CM | POA: Insufficient documentation

## 2022-09-20 DIAGNOSIS — Z79899 Other long term (current) drug therapy: Secondary | ICD-10-CM | POA: Insufficient documentation

## 2022-09-20 DIAGNOSIS — Z9981 Dependence on supplemental oxygen: Secondary | ICD-10-CM | POA: Insufficient documentation

## 2022-09-20 DIAGNOSIS — C3402 Malignant neoplasm of left main bronchus: Secondary | ICD-10-CM | POA: Insufficient documentation

## 2022-09-20 DIAGNOSIS — Z5189 Encounter for other specified aftercare: Secondary | ICD-10-CM | POA: Insufficient documentation

## 2022-09-20 DIAGNOSIS — Z79633 Long term (current) use of mitotic inhibitor: Secondary | ICD-10-CM | POA: Insufficient documentation

## 2022-09-20 DIAGNOSIS — Z515 Encounter for palliative care: Secondary | ICD-10-CM | POA: Insufficient documentation

## 2022-09-20 NOTE — Telephone Encounter (Signed)
Called patient spouse as requested for new patient consult.  Patient was discharged from hospital to rehab.  Spouse doesn't have any idea how long she's expected to remain at SNF. Provided my cell# in a text to return call to set up a nutrition consult once out of rehab or if the rehab RD has any concerns.  April Manson, RDN, LDN Registered Dietitian, [place] Part Time Remote (Usual office hours: Tuesday-Thursday) Cell: [phone]

## 2022-09-20 NOTE — Progress Notes (Signed)
[place] Work  Clinical Social Work was referred by Art therapist for assessment of psychosocial needs.  Clinical Social Worker contacted caregiver by phone to offer support and assess for needs.  Spoke with patient's husband, Rhonda Blackwell.  He stated patient was adjusting to being at the facility and is engaging with other residents.  Their daughter, [name], is also a Education officer, museum, and is very involved in patient's care.  CST attempted to contact patient at [facility].  Left vm.     Margaree Mackintosh, LCSW  Clinical Social Worker [facility]

## 2022-09-22 NOTE — Progress Notes (Deleted)
Palliative Medicine [facility]  Telephone:([phone]) [phone] Fax:(336) [phone]   Name: Rhonda Blackwell Date: 09/22/2022 MRN: [id number]  DOB: [birth year]  Patient Care Team: Loyal Jacobson, MD as PCP - General (Family Medicine) Myna Hidalgo Rose Phi, MD as Medical Oncologist (Oncology) Gwendel Hanson, RN as Oncology Nurse Navigator    REASON FOR CONSULTATION: Rhonda Blackwell is a 72 y.o. 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 Palliative ask to see for symptom management and goals of care.    SOCIAL HISTORY:     reports that she has been smoking cigarettes. She has been smoking an average of 1 pack per day. She has never used smokeless tobacco. She reports that she does not drink alcohol and does not use drugs.  ADVANCE DIRECTIVES:  None on file  CODE STATUS: Full code  PAST MEDICAL HISTORY: Past Medical History:  Diagnosis Date   Bronchitis    COPD (chronic obstructive pulmonary disease) (HCC)    Diabetes mellitus without complication (HCC)    Hypertension    Lung cancer, primary, with metastasis from lung to other site, left (HCC) 09/04/2022    PAST SURGICAL HISTORY:  Past Surgical History:  Procedure Laterality Date   CESAREAN SECTION     CHOLECYSTECTOMY     FLEXIBLE SIGMOIDOSCOPY N/A 09/08/2022   Procedure: FLEXIBLE SIGMOIDOSCOPY;  Surgeon: Jeani Hawking, MD;  Location: [facility];  Service: Gastroenterology;  Laterality: N/A;   FOOT SURGERY     HERNIA REPAIR     IR IMAGING GUIDED PORT INSERTION  09/04/2022    HEMATOLOGY/ONCOLOGY HISTORY:  Oncology History  Lung cancer, primary, with metastasis from lung to other site, left  09/04/2022 Initial Diagnosis   Lung cancer, primary, with metastasis from lung to other site, left (HCC)   09/04/2022 Cancer Staging   Staging form: Lung, AJCC 8th Edition -  Clinical stage from 09/04/2022: Stage IV (cT3, cN3, pM1) - Signed by Josph Macho, MD on 09/04/2022 Stage prefix: Initial diagnosis Histologic grade (G): G3 Histologic grading system: 4 grade system   09/05/2022 - 09/13/2022 Chemotherapy   Patient is on Treatment Plan : ESOPHAGUS Carboplatin + Paclitaxel Weekly X 6 Weeks with XRT     10/02/2022 -  Chemotherapy   Patient is on Treatment Plan : LUNG Atezolizumab  + Bevacizumab + CARBOplatin + PACLitaxel q21d Induction X 6 Cycles / Atezolizumab + Bevacizumab q21d Maintenance       ALLERGIES:  is allergic to bystolic [nebivolol hcl], hydrochlorothiazide, and norvasc [amlodipine].  MEDICATIONS:  Current Outpatient Medications  Medication Sig Dispense Refill   acetaminophen (TYLENOL) 650 MG CR tablet Take 650 mg by mouth every 8 (eight) hours as needed for pain.     albuterol (PROVENTIL HFA;VENTOLIN HFA) 108 (90 BASE) MCG/ACT inhaler Inhale 2 puffs into the lungs every 6 (six) hours as needed for shortness of breath (cough). 1 Inhaler 0   [START ON 09/25/2022] amiodarone (PACERONE) 200 MG tablet Take 1 tablet (200 mg total) by mouth daily. 30 tablet 0   amiodarone (PACERONE) 200 MG tablet Take 1 tablet (200 mg total) by mouth every evening for 10 days. 10 tablet 0   apixaban (ELIQUIS) 5 MG TABS tablet Take 1 tablet (5 mg total) by mouth 2 (two) times daily. 60 tablet 0   atorvastatin (LIPITOR) 40 MG tablet Take 40 mg by mouth daily.     bisacodyl (  DULCOLAX) 5 MG EC tablet Take 1 tablet (5 mg total) by mouth daily as needed for moderate constipation. 30 tablet 0   diltiazem (CARDIZEM CD) 120 MG 24 hr capsule Take 1 capsule (120 mg total) by mouth daily. 30 capsule 0   dronabinol (MARINOL) 2.5 MG capsule Take 1 capsule (2.5 mg total) by mouth 2 (two) times daily before lunch and supper. 60 capsule 0   DULoxetine (CYMBALTA) 60 MG capsule Take 60 mg by mouth 2 (two) times daily.     fluticasone (FLONASE) 50 MCG/ACT nasal spray Place 2 sprays into both  nostrils daily.     fluticasone-salmeterol (ADVAIR) 250-50 MCG/ACT AEPB Inhale 1 puff into the lungs 2 (two) times daily as needed (wheezing, shortness of breath.).     furosemide (LASIX) 10 MG/ML injection Inject 4 mLs (40 mg total) into the vein daily. 4 mL 0   guaiFENesin (MUCINEX) 600 MG 12 hr tablet Take 1 tablet (600 mg total) by mouth 2 (two) times daily for 10 days. 20 tablet 0   insulin aspart (NOVOLOG) 100 UNIT/ML injection Inject 0-6 Units into the skin 3 (three) times daily with meals. 10 mL 11   labetalol (NORMODYNE) 100 MG tablet Take 100 mg by mouth 2 (two) times daily.     pantoprazole (PROTONIX) 40 MG tablet Take 40 mg by mouth daily.     polyethylene glycol (MIRALAX / GLYCOLAX) 17 g packet Take 17 g by mouth 2 (two) times daily. 14 each 0   senna-docusate (SENOKOT-S) 8.6-50 MG tablet Take 1 tablet by mouth 2 (two) times daily. 60 tablet 0   No current facility-administered medications for this visit.    VITAL SIGNS: There were no vitals taken for this visit. There were no vitals filed for this visit.  Estimated body mass index is 25.16 kg/m as calculated from the following:   Height as of 09/03/22:  (1.626 m).   Weight as of 09/04/22: 146 lb 9.7 oz (66.5 kg).  LABS: CBC:    Component Value Date/Time   WBC 10.9 (H) 09/14/2022 0148   HGB 10.5 (L) 09/14/2022 0148   HCT 33.3 (L) 09/14/2022 0148   PLT 595 (H) 09/14/2022 0148   MCV 78.0 (L) 09/14/2022 0148   NEUTROABS 9.0 (H) 09/14/2022 0148   LYMPHSABS 0.8 09/14/2022 0148   MONOABS 0.9 09/14/2022 0148   EOSABS 0.1 09/14/2022 0148   BASOSABS 0.0 09/14/2022 0148   Comprehensive Metabolic Panel:    Component Value Date/Time   NA 130 (L) 09/14/2022 0148   K 3.9 09/14/2022 0148   CL 97 (L) 09/14/2022 0148   CO2 24 09/14/2022 0148   BUN 18 09/14/2022 0148   CREATININE 0.85 09/14/2022 0148   GLUCOSE 176 (H) 09/14/2022 0148   CALCIUM 8.4 (L) 09/14/2022 0148   CALCIUM 7.0 (L) 09/05/2022 0839   AST 13 (L)  09/14/2022 0148   ALT 14 09/14/2022 0148   ALKPHOS 85 09/14/2022 0148   BILITOT 0.7 09/14/2022 0148   PROT 6.9 09/14/2022 0148   ALBUMIN 2.9 (L) 09/14/2022 0148    RADIOGRAPHIC STUDIES: DG Chest Port 1 View  Result Date: 09/07/2022 CLINICAL DATA:  [id number] with pneumonia, left chest tube. EXAM: PORTABLE CHEST 1 VIEW COMPARISON:  Portable chest yesterday at 4:47 a.m. FINDINGS: 4:39 a.m. There is stable cardiomegaly with mild vascular prominence and interstitial edema. Left upper lobe perihilar opacity extending to the periphery is again noted consistent with pneumonia or mass. Left lower lobe consolidation appears similar with left pleural  effusion and unchanged positioning of pigtail left chest tube. There is no visible pneumothorax. No consolidation is seen on the right. Right IJ port catheter positioning is stable in the distal SVC. Stable mediastinum with calcification and tortuosity of aorta. Overall aeration seems unchanged.  No new abnormality. IMPRESSION: No significant change in the appearance of the chest. Left upper lobe perihilar opacity extending to the periphery is again noted consistent with pneumonia or mass. Left lower lobe consolidation and left pleural effusion appear similar. Cardiomegaly with mild interstitial edema. Electronically Signed   By: Almira Bar M.D.   On: 09/07/2022 06:41   CT ANGIO HEAD NECK W WO CM  Result Date: 09/02/2022 CLINICAL DATA:  Acute neurologic deficit EXAM: CT ANGIOGRAPHY HEAD AND NECK TECHNIQUE: Multidetector CT imaging of the head and neck was performed using the standard protocol during bolus administration of intravenous contrast. Multiplanar CT image reconstructions and MIPs were obtained to evaluate the vascular anatomy. Carotid stenosis measurements (when applicable) are obtained utilizing NASCET criteria, using the distal internal carotid diameter as the denominator. RADIATION DOSE REDUCTION: This exam was performed according to the departmental  dose-optimization program which includes automated exposure control, adjustment of the mA and/or kV according to patient size and/or use of iterative reconstruction technique. CONTRAST:  95mL OMNIPAQUE IOHEXOL 350 MG/ML SOLN COMPARISON:  None Available. FINDINGS: CT HEAD FINDINGS Brain: There is no mass, hemorrhage or extra-axial collection. The size and configuration of the ventricles and extra-axial CSF spaces are normal. Multiple old infarcts of the deep gray nuclei. There is hypoattenuation of the periventricular white matter, most commonly indicating chronic ischemic microangiopathy. Incidentally noted partially empty sella. Skull: Multiple lucent lesions of the calvarium, largest located in the right parietal bone with transcortical extension. Sinuses/Orbits: No fluid levels or advanced mucosal thickening of the visualized paranasal sinuses. No mastoid or middle ear effusion. The orbits are normal. CTA NECK FINDINGS SKELETON: There is no bony spinal canal stenosis. No lytic or blastic lesion. OTHER NECK: Normal pharynx, larynx and major salivary glands. No cervical lymphadenopathy. Unremarkable thyroid gland. UPPER CHEST: Consolidative opacity in the left upper lobe. Bilateral pleural effusions. AORTIC ARCH: There is calcific atherosclerosis of the aortic arch. There is no aneurysm, dissection or hemodynamically significant stenosis of the visualized portion of the aorta. Conventional 3 vessel aortic branching pattern. The visualized proximal subclavian arteries are widely patent. RIGHT CAROTID SYSTEM: No dissection, occlusion or aneurysm. Mild atherosclerotic calcification at the carotid bifurcation without hemodynamically significant stenosis. LEFT CAROTID SYSTEM: No dissection, occlusion or aneurysm. Mild atherosclerotic calcification at the carotid bifurcation without hemodynamically significant stenosis. VERTEBRAL ARTERIES: Codominant configuration. Both origins are clearly patent. There is no dissection,  occlusion or flow-limiting stenosis to the skull base (V1-V3 segments). CTA HEAD FINDINGS POSTERIOR CIRCULATION: --Vertebral arteries: Normal V4 segments. --Inferior cerebellar arteries: Normal. --Basilar artery: Normal. --Superior cerebellar arteries: Normal. --Posterior cerebral arteries (PCA): Normal. ANTERIOR CIRCULATION: --Intracranial internal carotid arteries: Normal. --Anterior cerebral arteries (ACA): Normal. Both A1 segments are present. Patent anterior communicating artery (a-comm). --Middle cerebral arteries (MCA): Normal. VENOUS SINUSES: As permitted by contrast timing, patent. ANATOMIC VARIANTS: None Review of the MIP images confirms the above findings. IMPRESSION: 1. No emergent large vessel occlusion or hemodynamically significant stenosis of the head or neck. 2. 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 3. Masslike consolidation in the left upper lobe, as characterized on recent chest CT. 4. Bilateral pleural effusions. Aortic atherosclerosis (ICD10-I70.0). Electronically Signed   By: Chrisandra Netters.D.  On: 09/02/2022 23:17    PERFORMANCE STATUS (ECOG) : {CHL ONC ECOG ZO:[id number]}PS:[id number]}  Review of Systems Unless otherwise noted, a complete review of systems is negative.  Physical Exam General: NAD Cardiovascular: regular rate and rhythm Pulmonary: clear ant fields Abdomen: soft, nontender, + bowel sounds Extremities: no edema, no joint deformities Skin: no rashes Neurological:  IMPRESSION: *** I introduced myself, Edoardo Laforte RN, and Palliative's role in collaboration with the oncology team. Concept of Palliative Care was introduced as specialized medical care for people and their families living with serious illness.  It focuses on providing relief from the symptoms and stress of a serious illness.  The goal is to improve quality of life for both the patient and the family. Values and goals of care important to patient and  family were attempted to be elicited.    We discussed *** current illness and what it means in the larger context of *** on-going co-morbidities. Natural disease trajectory and expectations were discussed.  I discussed the importance of continued conversation with family and their medical providers regarding overall plan of care and treatment options, ensuring decisions are within the context of the patients values and GOCs.  PLAN: Established therapeutic relationship. Education provided on palliative's role in collaboration with their Oncology/Radiation team. I will plan to see patient back in 2-4 weeks in collaboration to other oncology appointments.    Patient expressed understanding and was in agreement with this plan. She also understands that She can call the clinic at any time with any questions, concerns, or complaints.   Thank you for your referral and allowing Palliative to assist in Mrs. Rhonda Blackwell's care.   Number and complexity of problems addressed: ***HIGH - 1 or more chronic illnesses with SEVERE exacerbation, progression, or side effects of treatment - advanced cancer, pain. Any controlled substances utilized were prescribed in the context of palliative care.   Visit consisted of counseling and education dealing with the complex and emotionally intense issues of symptom management and palliative care in the setting of serious and potentially life-threatening illness.Greater than 50%  of this time was spent counseling and coordinating care related to the above assessment and plan.  Signed by: Willette AlmaNikki Pickenpack-Cousar, AGPCNP-BC Palliative Medicine Team/[facility]   *Please note that this is a verbal dictation therefore any spelling or grammatical errors are due to the "Dragon [employer] One" system interpretation.

## 2022-09-25 ENCOUNTER — Inpatient Hospital Stay: Payer: Medicare Other | Admitting: Nurse Practitioner

## 2022-09-25 ENCOUNTER — Encounter: Payer: Self-pay | Admitting: Nurse Practitioner

## 2022-09-25 ENCOUNTER — Encounter: Payer: Self-pay | Admitting: Hematology & Oncology

## 2022-09-25 NOTE — Progress Notes (Signed)
Patient scheduled for outpatient palliative visit s/p recent hospitalization and discharge to [facility]. Patient did not come to visit today however family is present. Shares they were unsure if patient was to attend given she is at facility and potential discussions.   Education provided on the differences between palliative and hospice. Family verbalized understanding and appreciation. They were concerned discussions were going to center around hospice. Advised this is not the case and hospital team wanted close follow-up for support as she navigates under the primary directions of her Oncologist (Dr. Myna Hidalgo). They are aware we are on the same team but different locations and our services is an added layer of support. They would like to continue with support.   We discussed going to Dr. Gustavo Lah appointment next and getting started on treatments. My card was given to patient's family (daughter, Lafonda Mosses and husband, Loraine Leriche) and they are aware to call in a couple of weeks for follow-up as desired. Family aware I will have my nurse, Maygan reach out for support also.   Rhonda Blackwell, AGPCNP-BC  Palliative Medicine Team/[facility]

## 2022-09-25 NOTE — Progress Notes (Signed)
Pharmacist Chemotherapy Monitoring - Initial Assessment    Anticipated start date: 10/02/22   The following has been reviewed per standard work regarding the patient's treatment regimen: The patient's diagnosis, treatment plan and drug doses, and organ/hematologic function Lab orders and baseline tests specific to treatment regimen  The treatment plan start date, drug sequencing, and pre-medications Prior authorization status  Patient's documented medication list, including drug-drug interaction screen and prescriptions for anti-emetics and supportive care specific to the treatment regimen The drug concentrations, fluid compatibility, administration routes, and timing of the medications to be used The patient's access for treatment and lifetime cumulative dose history, if applicable  The patient's medication allergies and previous infusion related reactions, if applicable   Changes made to treatment plan:  N/A  Follow up needed:  N/A   Dineen Conradt, Nevin Bloodgood, RPH, 09/25/2022  8:00 AM

## 2022-10-02 ENCOUNTER — Ambulatory Visit (HOSPITAL_BASED_OUTPATIENT_CLINIC_OR_DEPARTMENT_OTHER)
Admission: RE | Admit: 2022-10-02 | Discharge: 2022-10-02 | Disposition: A | Discharge status: Home / Self Care | Payer: Medicare Other | Source: Ambulatory Visit | Attending: Hematology & Oncology | Admitting: Hematology & Oncology

## 2022-10-02 ENCOUNTER — Inpatient Hospital Stay: Payer: Medicare Other

## 2022-10-02 ENCOUNTER — Encounter: Payer: Self-pay | Admitting: Hematology & Oncology

## 2022-10-02 ENCOUNTER — Inpatient Hospital Stay (HOSPITAL_BASED_OUTPATIENT_CLINIC_OR_DEPARTMENT_OTHER): Payer: Medicare Other | Admitting: Hematology & Oncology

## 2022-10-02 ENCOUNTER — Telehealth: Payer: Self-pay

## 2022-10-02 VITALS — BP 102/68 | HR 83 | Temp 98.1°F | Resp 17 | Ht 64.0 in | Wt 136.0 lb

## 2022-10-02 VITALS — BP 155/86 | HR 96 | Resp 17

## 2022-10-02 DIAGNOSIS — Z7901 Long term (current) use of anticoagulants: Secondary | ICD-10-CM | POA: Diagnosis not present

## 2022-10-02 DIAGNOSIS — Z515 Encounter for palliative care: Secondary | ICD-10-CM | POA: Diagnosis not present

## 2022-10-02 DIAGNOSIS — Z79620 Long term (current) use of immunosuppressive biologic: Secondary | ICD-10-CM | POA: Diagnosis not present

## 2022-10-02 DIAGNOSIS — C3492 Malignant neoplasm of unspecified part of left bronchus or lung: Secondary | ICD-10-CM

## 2022-10-02 DIAGNOSIS — Z79633 Long term (current) use of mitotic inhibitor: Secondary | ICD-10-CM | POA: Diagnosis not present

## 2022-10-02 DIAGNOSIS — C7951 Secondary malignant neoplasm of bone: Secondary | ICD-10-CM | POA: Diagnosis not present

## 2022-10-02 DIAGNOSIS — Z888 Allergy status to other drugs, medicaments and biological substances status: Secondary | ICD-10-CM | POA: Diagnosis not present

## 2022-10-02 DIAGNOSIS — C3402 Malignant neoplasm of left main bronchus: Secondary | ICD-10-CM | POA: Diagnosis present

## 2022-10-02 DIAGNOSIS — Z923 Personal history of irradiation: Secondary | ICD-10-CM | POA: Diagnosis not present

## 2022-10-02 DIAGNOSIS — Z5111 Encounter for antineoplastic chemotherapy: Secondary | ICD-10-CM | POA: Diagnosis present

## 2022-10-02 DIAGNOSIS — I4891 Unspecified atrial fibrillation: Secondary | ICD-10-CM | POA: Diagnosis not present

## 2022-10-02 DIAGNOSIS — Z5112 Encounter for antineoplastic immunotherapy: Secondary | ICD-10-CM | POA: Diagnosis not present

## 2022-10-02 DIAGNOSIS — J90 Pleural effusion, not elsewhere classified: Secondary | ICD-10-CM | POA: Diagnosis not present

## 2022-10-02 DIAGNOSIS — Z79630 Long term (current) use of alkylating agent: Secondary | ICD-10-CM | POA: Diagnosis not present

## 2022-10-02 DIAGNOSIS — Z5189 Encounter for other specified aftercare: Secondary | ICD-10-CM | POA: Diagnosis not present

## 2022-10-02 DIAGNOSIS — Z9981 Dependence on supplemental oxygen: Secondary | ICD-10-CM | POA: Diagnosis not present

## 2022-10-02 DIAGNOSIS — Z79899 Other long term (current) drug therapy: Secondary | ICD-10-CM | POA: Diagnosis not present

## 2022-10-02 LAB — CMP (CANCER CENTER ONLY)
ALT: 11 U/L (ref 0–44)
AST: 11 U/L — ABNORMAL LOW (ref 15–41)
Albumin: 3.5 g/dL (ref 3.5–5.0)
Alkaline Phosphatase: 156 U/L — ABNORMAL HIGH (ref 38–126)
Anion gap: 8 (ref 5–15)
BUN: 15 mg/dL (ref 8–23)
CO2: 31 mmol/L (ref 22–32)
Calcium: 8.9 mg/dL (ref 8.9–10.3)
Chloride: 92 mmol/L — ABNORMAL LOW (ref 98–111)
Creatinine: 0.86 mg/dL (ref 0.44–1.00)
GFR, Estimated: >60 mL/min (ref >60)
Glucose, Bld: 114 mg/dL — ABNORMAL HIGH (ref 70–99)
Potassium: 4.4 mmol/L (ref 3.5–5.1)
Sodium: 131 mmol/L — ABNORMAL LOW (ref 135–145)
Total Bilirubin: 0.5 mg/dL (ref 0.3–1.2)
Total Protein: 6.8 g/dL (ref 6.5–8.1)

## 2022-10-02 LAB — CBC WITH DIFFERENTIAL (CANCER CENTER ONLY)
Abs Immature Granulocytes: 0.06 10*3/uL (ref 0.00–0.07)
Basophils Absolute: 0 10*3/uL (ref 0.0–0.1)
Basophils Relative: 0 %
Eosinophils Absolute: 0.1 10*3/uL (ref 0.0–0.5)
Eosinophils Relative: 2 %
HCT: 33.5 % — ABNORMAL LOW (ref 36.0–46.0)
Hemoglobin: 10.7 g/dL — ABNORMAL LOW (ref 12.0–15.0)
Immature Granulocytes: 1 %
Lymphocytes Relative: 8 %
Lymphs Abs: 0.7 10*3/uL (ref 0.7–4.0)
MCH: 24.8 pg — ABNORMAL LOW (ref 26.0–34.0)
MCHC: 31.9 g/dL (ref 30.0–36.0)
MCV: 77.5 fL — ABNORMAL LOW (ref 80.0–100.0)
Monocytes Absolute: 0.6 10*3/uL (ref 0.1–1.0)
Monocytes Relative: 7 %
Neutro Abs: 6.8 10*3/uL (ref 1.7–7.7)
Neutrophils Relative %: 82 %
Platelet Count: 352 10*3/uL (ref 150–400)
RBC: 4.32 MIL/uL (ref 3.87–5.11)
RDW: 15.5 % (ref 11.5–15.5)
WBC Count: 8.3 10*3/uL (ref 4.0–10.5)
nRBC: 0 % (ref 0.0–0.2)

## 2022-10-02 LAB — TSH: TSH: 1.515 u[IU]/mL (ref 0.350–4.500)

## 2022-10-02 MED ORDER — SODIUM CHLORIDE 0.9 % IV SOLN
160.0000 mg/m2 | Freq: Once | INTRAVENOUS | Status: AC
  Administered 2022-10-02: 276 mg via INTRAVENOUS
  Filled 2022-10-02: qty 46

## 2022-10-02 MED ORDER — PALONOSETRON HCL INJECTION 0.25 MG/5ML
0.2500 mg | Freq: Once | INTRAVENOUS | Status: AC
  Administered 2022-10-02: 0.25 mg via INTRAVENOUS
  Filled 2022-10-02: qty 5

## 2022-10-02 MED ORDER — SODIUM CHLORIDE 0.9 % IV SOLN
150.0000 mg | Freq: Once | INTRAVENOUS | Status: AC
  Administered 2022-10-02: 150 mg via INTRAVENOUS
  Filled 2022-10-02: qty 150

## 2022-10-02 MED ORDER — SODIUM CHLORIDE 0.9 % IV SOLN
1200.0000 mg | Freq: Once | INTRAVENOUS | Status: AC
  Administered 2022-10-02: 1200 mg via INTRAVENOUS
  Filled 2022-10-02: qty 20

## 2022-10-02 MED ORDER — ONDANSETRON HCL 8 MG PO TABS
8.0000 mg | ORAL_TABLET | Freq: Three times a day (TID) | ORAL | 1 refills | Status: DC | PRN
Start: 2022-10-02 — End: 2023-01-13

## 2022-10-02 MED ORDER — SODIUM CHLORIDE 0.9 % IV SOLN
316.8000 mg | Freq: Once | INTRAVENOUS | Status: AC
  Administered 2022-10-02: 320 mg via INTRAVENOUS
  Filled 2022-10-02: qty 32

## 2022-10-02 MED ORDER — FAMOTIDINE IN NACL 20-0.9 MG/50ML-% IV SOLN
20.0000 mg | Freq: Once | INTRAVENOUS | Status: AC
  Administered 2022-10-02: 20 mg via INTRAVENOUS
  Filled 2022-10-02: qty 50

## 2022-10-02 MED ORDER — DIPHENHYDRAMINE HCL 50 MG/ML IJ SOLN
50.0000 mg | Freq: Once | INTRAMUSCULAR | Status: AC
  Administered 2022-10-02: 50 mg via INTRAVENOUS
  Filled 2022-10-02: qty 1

## 2022-10-02 MED ORDER — SODIUM CHLORIDE 0.9 % IV SOLN: Freq: Once | INTRAVENOUS | Status: AC

## 2022-10-02 MED ORDER — LIDOCAINE-PRILOCAINE 2.5-2.5 % EX CREA
TOPICAL_CREAM | CUTANEOUS | 3 refills | Status: DC
Start: 2022-10-02 — End: 2022-10-02

## 2022-10-02 MED ORDER — SODIUM CHLORIDE 0.9 % IV SOLN
15.0000 mg/kg | Freq: Once | INTRAVENOUS | Status: AC
  Administered 2022-10-02: 1000 mg via INTRAVENOUS
  Filled 2022-10-02: qty 40

## 2022-10-02 MED ORDER — LIDOCAINE-PRILOCAINE 2.5-2.5 % EX CREA
TOPICAL_CREAM | CUTANEOUS | 3 refills | Status: DC
Start: 2022-10-02 — End: 2023-01-13

## 2022-10-02 MED ORDER — SODIUM CHLORIDE 0.9% FLUSH
10.0000 mL | INTRAVENOUS | Status: DC | PRN
  Administered 2022-10-02: 10 mL

## 2022-10-02 MED ORDER — DEXAMETHASONE 4 MG PO TABS
ORAL_TABLET | ORAL | 1 refills | Status: DC
Start: 2022-10-02 — End: 2023-01-13

## 2022-10-02 MED ORDER — SODIUM CHLORIDE 0.9 % IV SOLN
10.0000 mg | Freq: Once | INTRAVENOUS | Status: AC
  Administered 2022-10-02: 10 mg via INTRAVENOUS
  Filled 2022-10-02: qty 10

## 2022-10-02 MED ORDER — HEPARIN SOD (PORK) LOCK FLUSH 100 UNIT/ML IV SOLN
500.0000 [IU] | Freq: Once | INTRAVENOUS | Status: AC | PRN
  Administered 2022-10-02: 500 [IU]

## 2022-10-02 MED ORDER — PROCHLORPERAZINE MALEATE 10 MG PO TABS
10.0000 mg | ORAL_TABLET | Freq: Four times a day (QID) | ORAL | 1 refills | Status: DC | PRN
Start: 2022-10-02 — End: 2023-01-13

## 2022-10-02 NOTE — Telephone Encounter (Signed)
Received fax from [facility] stating they needed directions for the EMLA cream. Will resend prescription with complete directions.

## 2022-10-02 NOTE — Patient Instructions (Signed)
[facility]  Discharge Instructions: Thank you for choosing [facility] to provide your oncology and hematology care.   If you have a lab appointment with the Cancer Center, please go directly to the Cancer Center and check in at the registration area.  Wear comfortable clothing and clothing appropriate for easy access to any Portacath or PICC line.   We strive to give you quality time with your provider. You may need to reschedule your appointment if you arrive late (15 or more minutes).  Arriving late affects you and other patients whose appointments are after yours.  Also, if you miss three or more appointments without notifying the office, you may be dismissed from the clinic at the provider's discretion.      For prescription refill requests, have your pharmacy contact our office and allow 72 hours for refills to be completed.    Today you received the following chemotherapy and/or immunotherapy agents Tecentriq, Bevacuzimab, Taxol and Carboplatin      To help prevent nausea and vomiting after your treatment, we encourage you to take your nausea medication as directed.  BELOW ARE SYMPTOMS THAT SHOULD BE REPORTED IMMEDIATELY: *FEVER GREATER THAN 100.4 F (38 C) OR HIGHER *CHILLS OR SWEATING *NAUSEA AND VOMITING THAT IS NOT CONTROLLED WITH YOUR NAUSEA MEDICATION *UNUSUAL SHORTNESS OF BREATH *UNUSUAL BRUISING OR BLEEDING *URINARY PROBLEMS (pain or burning when urinating, or frequent urination) *BOWEL PROBLEMS (unusual diarrhea, constipation, pain near the anus) TENDERNESS IN MOUTH AND THROAT WITH OR WITHOUT PRESENCE OF ULCERS (sore throat, sores in mouth, or a toothache) UNUSUAL RASH, SWELLING OR PAIN  UNUSUAL VAGINAL DISCHARGE OR ITCHING   Items with * indicate a potential emergency and should be followed up as soon as possible or go to the Emergency Department if any problems should occur.  Please show the CHEMOTHERAPY ALERT CARD or  IMMUNOTHERAPY ALERT CARD at check-in to the Emergency Department and triage nurse. Should you have questions after your visit or need to cancel or reschedule your appointment, please contact [facility]  [phone] and follow the prompts.  Office hours are 8:00 a.m. to 4:30 p.m. Monday - Friday. Please note that voicemails left after 4:00 p.m. may not be returned until the following business day.  We are closed weekends and major holidays. You have access to a nurse at all times for urgent questions. Please call the main number to the clinic [phone] and follow the prompts.  For any non-urgent questions, you may also contact your provider using MyChart. We now offer e-Visits for anyone 85 and older to request care online for non-urgent symptoms. For details visit [email or web address].   Also download the MyChart app! Go to the app store, search "MyChart", open the app, select [place], and log in with your MyChart username and password.

## 2022-10-02 NOTE — Progress Notes (Signed)
Hematology and Oncology Follow Up Visit  Rhonda Blackwell [id number] 05/09/1951 72 y.o. 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 twice daily     Interim History:  Rhonda Blackwell is in for first office visit.  I had seen her in the hospital.  We had consulted on her at [facility].  She was admitted because of progressive shortness of breath and large pleural effusion.  She had been followed at the [facility].  However, she cannot make follow-up there.  She was subsequently admitted and she was felt to have "pneumonia".  She had a large mass in the left lung.  She had left possible lymphangitic spread.  She had diffuse bony mets.  She did undergo low-dose chemotherapy with radiation therapy to try to help open up her left lung.  She completed this..  She is in a Rehab facility.  It is hard to say when she will leave.  Hopefully, she will be able to to go home.  She does look pretty good.  She has lost some weight.  She is eating okay.  Her albumin has come up nicely.  She has little bit of shortness of breath.  She is on chronic oxygen.  We will get a chest x-ray on her.  I told her and her family that it is still too soon to do any testing to see how the cancer is responding.  I think that the x-ray will help [place] out.  She has had no headache.  She is on Eliquis for the atrial fibrillation.  There is been no bleeding.  She has little bit of a cough but no hemoptysis.  Overall, I would have said that her performance status is probably ECOG 1-2.   Medications:  Current Outpatient Medications:    acetaminophen (TYLENOL) 650 MG CR tablet, Take 650 mg by mouth every 8 (eight) hours as needed for pain., Disp: , Rfl:    albuterol  (PROVENTIL HFA;VENTOLIN HFA) 108 (90 BASE) MCG/ACT inhaler, Inhale 2 puffs into the lungs every 6 (six) hours as needed for shortness of breath (cough)., Disp: 1 Inhaler, Rfl: 0   amiodarone (PACERONE) 200 MG tablet, Take 1 tablet (200 mg total) by mouth daily., Disp: 30 tablet, Rfl: 0   amiodarone (PACERONE) 200 MG tablet, Take 1 tablet (200 mg total) by mouth every evening for 10 days., Disp: 10 tablet, Rfl: 0   apixaban (ELIQUIS) 5 MG TABS tablet, Take 1 tablet (5 mg total) by mouth 2 (two) times daily., Disp: 60 tablet, Rfl: 0   atorvastatin (LIPITOR) 40 MG tablet, Take 40 mg by mouth daily., Disp: , Rfl:    bisacodyl (DULCOLAX) 5 MG EC tablet, Take 1 tablet (5 mg total) by mouth daily as needed for moderate constipation., Disp: 30 tablet, Rfl: 0   diltiazem (CARDIZEM CD) 120 MG 24 hr capsule, Take 1 capsule (120 mg total) by mouth daily., Disp: 30 capsule, Rfl: 0   dronabinol (MARINOL) 2.5 MG capsule, Take 1 capsule (2.5 mg total) by mouth 2 (two) times daily before lunch and supper., Disp: 60 capsule, Rfl: 0   DULoxetine (CYMBALTA) 60 MG capsule, Take 60 mg by mouth 2 (two) times daily., Disp: , Rfl:    fluticasone (FLONASE) 50 MCG/ACT nasal spray, Place 2 sprays into both nostrils daily.,  Disp: , Rfl:    fluticasone-salmeterol (ADVAIR) 250-50 MCG/ACT AEPB, Inhale 1 puff into the lungs 2 (two) times daily as needed (wheezing, shortness of breath.)., Disp: , Rfl:    furosemide (LASIX) 10 MG/ML injection, Inject 4 mLs (40 mg total) into the vein daily., Disp: 4 mL, Rfl: 0   insulin aspart (NOVOLOG) 100 UNIT/ML injection, Inject 0-6 Units into the skin 3 (three) times daily with meals., Disp: 10 mL, Rfl: 11   labetalol (NORMODYNE) 100 MG tablet, Take 100 mg by mouth 2 (two) times daily., Disp: , Rfl:    pantoprazole (PROTONIX) 40 MG tablet, Take 40 mg by mouth daily., Disp: , Rfl:    polyethylene glycol (MIRALAX / GLYCOLAX) 17 g packet, Take 17 g by mouth 2 (two) times daily., Disp: 14 each, Rfl:  0   senna-docusate (SENOKOT-S) 8.6-50 MG tablet, Take 1 tablet by mouth 2 (two) times daily., Disp: 60 tablet, Rfl: 0  Allergies:  Allergies  Allergen Reactions   Bystolic [Nebivolol Hcl] Nausea Only and Other (See Comments)    Abdominal pain   Hydrochlorothiazide Nausea Only and Swelling    Leg swelling   Norvasc [Amlodipine] Swelling    Leg swelling    Past Medical History, Surgical history, Social history, and Family History were reviewed and updated.  Review of Systems: Review of Systems  Constitutional:  Positive for fatigue.  HENT:  Negative.    Eyes: Negative.   Respiratory:  Positive for cough and shortness of breath.   Cardiovascular: Negative.   Gastrointestinal: Negative.   Endocrine: Negative.   Genitourinary: Negative.    Musculoskeletal: Negative.   Skin: Negative.   Neurological: Negative.   Hematological: Negative.   Psychiatric/Behavioral: Negative.      Physical Exam:  height is 5\' 4"  (1.626 m) and weight is 136 lb (61.7 kg). Her oral temperature is 98.1 F (36.7 C). Her blood pressure is 102/68 and her pulse is 83. Her respiration is 17 and oxygen saturation is 100%.   Wt Readings from Last 3 Encounters:  10/02/22 136 lb (61.7 kg)  09/04/22 146 lb 9.7 oz (66.5 kg)  07/24/22 153 lb 7 oz (69.6 kg)    Physical Exam Vitals reviewed.  Constitutional:      Comments: Slightly ill-appearing Asian female in no obvious distress.  She is wearing supplemental oxygen.  HENT:     Head: Normocephalic and atraumatic.  Eyes:     Pupils: Pupils are equal, round, and reactive to light.  Cardiovascular:     Rate and Rhythm: Normal rate and regular rhythm.     Heart sounds: Normal heart sounds.     Comments: Cardiac exam is regular rate and rhythm.  I really do not hear any atrial fibrillation. Pulmonary:     Effort: Pulmonary effort is normal.     Breath sounds: Normal breath sounds.     Comments: Lungs sound clear on the right side.  She has decent breath  sounds on the left side.  I do not hear any wheezing. Abdominal:     General: Bowel sounds are normal.     Palpations: Abdomen is soft.  Musculoskeletal:        General: No tenderness or deformity. Normal range of motion.     Cervical back: Normal range of motion.  Lymphadenopathy:     Cervical: No cervical adenopathy.  Skin:    General: Skin is warm and dry.     Findings: No erythema or rash.  Neurological:  Mental Status: She is alert and oriented to person, place, and time.  Psychiatric:        Behavior: Behavior normal.        Thought Content: Thought content normal.        Judgment: Judgment normal.     Lab Results  Component Value Date   WBC 8.3 10/02/2022   HGB 10.7 (L) 10/02/2022   HCT 33.5 (L) 10/02/2022   MCV 77.5 (L) 10/02/2022   PLT 352 10/02/2022     Chemistry      Component Value Date/Time   NA 131 (L) 10/02/2022 0940   K 4.4 10/02/2022 0940   CL 92 (L) 10/02/2022 0940   CO2 31 10/02/2022 0940   BUN 15 10/02/2022 0940   CREATININE 0.86 10/02/2022 0940      Component Value Date/Time   CALCIUM 8.9 10/02/2022 0940   CALCIUM 7.0 (L) 09/05/2022 0839   ALKPHOS 156 (H) 10/02/2022 0940   AST 11 (L) 10/02/2022 0940   ALT 11 10/02/2022 0940   BILITOT 0.5 10/02/2022 0940       Impression and Plan: Rhonda Blackwell is a very charming 72 year old Asian female.  I think she is originally from [place].  She has metastatic adenocarcinoma of the lung.  She had palliative radiation therapy with low-dose chemotherapy in the hospital.  Hopefully, this is helped with her left lung and hopefully help with any kind of postobstructive pneumonia.  We will now try her on full dose chemotherapy.  We will use chemotherapy along with Avastin and Tecentriq.  Will try to use all that we have for her.  I think she can tolerate this.  We will reduce the dose of the Taxol.  I know we have a long way to go before she will get better.  I am just happy that she is doing little bit  better.  Hopefully, she will be able to go home.  I know she would want to go home.  The fact that her albumin is so good is also encouraging.  Again, I would probably give her 2 cycles of treatment and then repeat her scans.  She had a PET scan that was done I think at [facility].  This was actually done in November.    I would like to see her back in 3 weeks.  She may need to come back sooner if there are any problems.  Josph Macho, MD 4/15/202410:45 AM

## 2022-10-03 ENCOUNTER — Other Ambulatory Visit: Payer: Self-pay

## 2022-10-03 ENCOUNTER — Encounter: Payer: Self-pay | Admitting: *Deleted

## 2022-10-03 DIAGNOSIS — Z7901 Long term (current) use of anticoagulants: Secondary | ICD-10-CM | POA: Diagnosis not present

## 2022-10-03 DIAGNOSIS — Z79633 Long term (current) use of mitotic inhibitor: Secondary | ICD-10-CM | POA: Diagnosis not present

## 2022-10-03 DIAGNOSIS — Z5189 Encounter for other specified aftercare: Secondary | ICD-10-CM | POA: Diagnosis not present

## 2022-10-03 DIAGNOSIS — C7951 Secondary malignant neoplasm of bone: Secondary | ICD-10-CM | POA: Diagnosis not present

## 2022-10-03 DIAGNOSIS — Z79630 Long term (current) use of alkylating agent: Secondary | ICD-10-CM | POA: Diagnosis not present

## 2022-10-03 DIAGNOSIS — C3402 Malignant neoplasm of left main bronchus: Secondary | ICD-10-CM | POA: Diagnosis present

## 2022-10-03 DIAGNOSIS — Z9981 Dependence on supplemental oxygen: Secondary | ICD-10-CM | POA: Diagnosis not present

## 2022-10-03 DIAGNOSIS — Z923 Personal history of irradiation: Secondary | ICD-10-CM | POA: Diagnosis not present

## 2022-10-03 DIAGNOSIS — I4891 Unspecified atrial fibrillation: Secondary | ICD-10-CM | POA: Diagnosis not present

## 2022-10-03 DIAGNOSIS — J90 Pleural effusion, not elsewhere classified: Secondary | ICD-10-CM | POA: Diagnosis not present

## 2022-10-03 DIAGNOSIS — Z5112 Encounter for antineoplastic immunotherapy: Secondary | ICD-10-CM | POA: Diagnosis present

## 2022-10-03 DIAGNOSIS — Z5111 Encounter for antineoplastic chemotherapy: Secondary | ICD-10-CM | POA: Diagnosis present

## 2022-10-03 DIAGNOSIS — Z888 Allergy status to other drugs, medicaments and biological substances status: Secondary | ICD-10-CM | POA: Diagnosis not present

## 2022-10-03 DIAGNOSIS — Z515 Encounter for palliative care: Secondary | ICD-10-CM | POA: Diagnosis not present

## 2022-10-03 DIAGNOSIS — Z79899 Other long term (current) drug therapy: Secondary | ICD-10-CM | POA: Diagnosis not present

## 2022-10-03 DIAGNOSIS — Z79620 Long term (current) use of immunosuppressive biologic: Secondary | ICD-10-CM | POA: Diagnosis not present

## 2022-10-03 LAB — T4: T4, Total: 8.4 ug/dL (ref 4.5–12.0)

## 2022-10-03 NOTE — Progress Notes (Signed)
Patient was seen for the first time in this office yesterday. She completed one cycle of ChemoRT while hospitalized. She started on chemotherapy alone on 10/02/2022.   Initial workup and staging completed at [facility]. Scans will likely be needed after her next cycle.   Oncology Nurse Navigator Documentation     10/03/2022   10:00 AM  Oncology Nurse Navigator Flowsheets  Diagnosis Status Confirmed Diagnosis Complete  Planned Course of Treatment Chemotherapy  Chemotherapy Actual Start Date: 10/02/2022  Navigator Follow Up Date: 10/23/2022  Navigator Follow Up Reason: Follow-up Appointment;Chemotherapy  Navigator Location [facility]  Navigator Encounter Type Appt/Treatment Plan Review  Patient Visit Type MedOnc  Treatment Phase Active Tx  Barriers/Navigation Needs Coordination of Care  Interventions None Required  Acuity Level 2-Minimal Needs (1-2 Barriers Identified)  Support Groups/Services Friends and Family  Time Spent with Patient 15  Genetic Counseling Type None

## 2022-10-04 ENCOUNTER — Telehealth: Payer: Self-pay

## 2022-10-04 ENCOUNTER — Inpatient Hospital Stay: Payer: Medicare Other

## 2022-10-04 VITALS — BP 116/64 | HR 77 | Temp 98.2°F | Resp 17

## 2022-10-04 DIAGNOSIS — C3492 Malignant neoplasm of unspecified part of left bronchus or lung: Secondary | ICD-10-CM

## 2022-10-04 DIAGNOSIS — Z5112 Encounter for antineoplastic immunotherapy: Secondary | ICD-10-CM | POA: Diagnosis not present

## 2022-10-04 MED ORDER — PEGFILGRASTIM-CBQV 6 MG/0.6ML ~~LOC~~ SOSY
6.0000 mg | PREFILLED_SYRINGE | Freq: Once | SUBCUTANEOUS | Status: AC
  Administered 2022-10-04: 6 mg via SUBCUTANEOUS
  Filled 2022-10-04: qty 0.6

## 2022-10-04 NOTE — Telephone Encounter (Signed)
This encounter was created in error - please disregard.

## 2022-10-04 NOTE — Progress Notes (Signed)
FMLA papers completed for family member to pick up at [facility] per cover sheet.

## 2022-10-04 NOTE — Patient Instructions (Signed)

## 2022-10-06 NOTE — Progress Notes (Signed)
                                                                                                                                                             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   Plan: The patient will receive a call in about one month from the radiation oncology department. She will continue follow up with her medical oncologist, Dr. Myna Hidalgo, as well.  ------------------------------------------------   Margaretmary Dys, MD [facility]  Radiation Oncology Direct Dial: [phone]  Fax: [phone] .com  Skype  LinkedIn

## 2022-10-17 ENCOUNTER — Ambulatory Visit
Admit: 2022-10-17 | Discharge: 2022-10-17 | Disposition: A | Discharge status: Home / Self Care | Payer: Medicare Other | Attending: Radiation Oncology | Admitting: Radiation Oncology

## 2022-10-17 NOTE — Progress Notes (Signed)
  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   The patient has scheduled follow up with her medical oncologist Dr. Myna Hidalgo for ongoing care, and was encouraged to call if she develops concerns or questions regarding radiation.   Ruel Favors, LPN

## 2022-10-23 ENCOUNTER — Telehealth: Payer: Self-pay | Admitting: *Deleted

## 2022-10-23 ENCOUNTER — Inpatient Hospital Stay (HOSPITAL_BASED_OUTPATIENT_CLINIC_OR_DEPARTMENT_OTHER): Payer: Medicare Other | Admitting: Hematology & Oncology

## 2022-10-23 ENCOUNTER — Inpatient Hospital Stay: Payer: Medicare Other

## 2022-10-23 ENCOUNTER — Encounter: Payer: Self-pay | Admitting: Hematology & Oncology

## 2022-10-23 ENCOUNTER — Encounter: Payer: Self-pay | Admitting: *Deleted

## 2022-10-23 ENCOUNTER — Inpatient Hospital Stay: Payer: Medicare Other | Attending: Hematology & Oncology

## 2022-10-23 VITALS — BP 130/54 | HR 81 | Temp 98.2°F | Resp 20 | Wt 136.1 lb

## 2022-10-23 VITALS — BP 119/63 | HR 87 | Resp 20

## 2022-10-23 DIAGNOSIS — Z923 Personal history of irradiation: Secondary | ICD-10-CM | POA: Insufficient documentation

## 2022-10-23 DIAGNOSIS — C3492 Malignant neoplasm of unspecified part of left bronchus or lung: Secondary | ICD-10-CM

## 2022-10-23 DIAGNOSIS — R0602 Shortness of breath: Secondary | ICD-10-CM | POA: Insufficient documentation

## 2022-10-23 DIAGNOSIS — Z79630 Long term (current) use of alkylating agent: Secondary | ICD-10-CM | POA: Diagnosis not present

## 2022-10-23 DIAGNOSIS — Z888 Allergy status to other drugs, medicaments and biological substances status: Secondary | ICD-10-CM | POA: Insufficient documentation

## 2022-10-23 DIAGNOSIS — I4891 Unspecified atrial fibrillation: Secondary | ICD-10-CM | POA: Insufficient documentation

## 2022-10-23 DIAGNOSIS — Z515 Encounter for palliative care: Secondary | ICD-10-CM | POA: Diagnosis not present

## 2022-10-23 DIAGNOSIS — Z79620 Long term (current) use of immunosuppressive biologic: Secondary | ICD-10-CM | POA: Diagnosis not present

## 2022-10-23 DIAGNOSIS — Z5111 Encounter for antineoplastic chemotherapy: Secondary | ICD-10-CM | POA: Diagnosis present

## 2022-10-23 DIAGNOSIS — R490 Dysphonia: Secondary | ICD-10-CM | POA: Diagnosis not present

## 2022-10-23 DIAGNOSIS — Z5112 Encounter for antineoplastic immunotherapy: Secondary | ICD-10-CM | POA: Insufficient documentation

## 2022-10-23 DIAGNOSIS — Z79899 Other long term (current) drug therapy: Secondary | ICD-10-CM | POA: Insufficient documentation

## 2022-10-23 DIAGNOSIS — R5383 Other fatigue: Secondary | ICD-10-CM | POA: Insufficient documentation

## 2022-10-23 DIAGNOSIS — R059 Cough, unspecified: Secondary | ICD-10-CM | POA: Insufficient documentation

## 2022-10-23 DIAGNOSIS — C3402 Malignant neoplasm of left main bronchus: Secondary | ICD-10-CM | POA: Diagnosis present

## 2022-10-23 DIAGNOSIS — Z79633 Long term (current) use of mitotic inhibitor: Secondary | ICD-10-CM | POA: Diagnosis not present

## 2022-10-23 DIAGNOSIS — Z7901 Long term (current) use of anticoagulants: Secondary | ICD-10-CM | POA: Diagnosis not present

## 2022-10-23 LAB — CBC WITH DIFFERENTIAL (CANCER CENTER ONLY)
Abs Immature Granulocytes: 0.06 10*3/uL (ref 0.00–0.07)
Basophils Absolute: 0 10*3/uL (ref 0.0–0.1)
Basophils Relative: 0 %
Eosinophils Absolute: 0 10*3/uL (ref 0.0–0.5)
Eosinophils Relative: 0 %
HCT: 31.2 % — ABNORMAL LOW (ref 36.0–46.0)
Hemoglobin: 9.8 g/dL — ABNORMAL LOW (ref 12.0–15.0)
Immature Granulocytes: 1 %
Lymphocytes Relative: 11 %
Lymphs Abs: 1.1 10*3/uL (ref 0.7–4.0)
MCH: 24.6 pg — ABNORMAL LOW (ref 26.0–34.0)
MCHC: 31.4 g/dL (ref 30.0–36.0)
MCV: 78.4 fL — ABNORMAL LOW (ref 80.0–100.0)
Monocytes Absolute: 0.2 10*3/uL (ref 0.1–1.0)
Monocytes Relative: 2 %
Neutro Abs: 8.5 10*3/uL — ABNORMAL HIGH (ref 1.7–7.7)
Neutrophils Relative %: 86 %
Platelet Count: 440 10*3/uL — ABNORMAL HIGH (ref 150–400)
RBC: 3.98 MIL/uL (ref 3.87–5.11)
RDW: 18.8 % — ABNORMAL HIGH (ref 11.5–15.5)
WBC Count: 9.8 10*3/uL (ref 4.0–10.5)
nRBC: 0 % (ref 0.0–0.2)

## 2022-10-23 LAB — CMP (CANCER CENTER ONLY)
ALT: 9 U/L (ref 0–44)
AST: 10 U/L — ABNORMAL LOW (ref 15–41)
Albumin: 3.8 g/dL (ref 3.5–5.0)
Alkaline Phosphatase: 113 U/L (ref 38–126)
Anion gap: 7 (ref 5–15)
BUN: 13 mg/dL (ref 8–23)
CO2: 25 mmol/L (ref 22–32)
Calcium: 8.8 mg/dL — ABNORMAL LOW (ref 8.9–10.3)
Chloride: 95 mmol/L — ABNORMAL LOW (ref 98–111)
Creatinine: 0.89 mg/dL (ref 0.44–1.00)
GFR, Estimated: >60 mL/min (ref >60)
Glucose, Bld: 171 mg/dL — ABNORMAL HIGH (ref 70–99)
Potassium: 4.8 mmol/L (ref 3.5–5.1)
Sodium: 127 mmol/L — ABNORMAL LOW (ref 135–145)
Total Bilirubin: 0.4 mg/dL (ref 0.3–1.2)
Total Protein: 7.2 g/dL (ref 6.5–8.1)

## 2022-10-23 LAB — IRON AND IRON BINDING CAPACITY (CC-WL,HP ONLY)
Iron: 74 ug/dL (ref 28–170)
Saturation Ratios: 24 % (ref 10.4–31.8)
TIBC: 304 ug/dL (ref 250–450)
UIBC: 230 ug/dL (ref 148–442)

## 2022-10-23 LAB — SAMPLE TO BLOOD BANK

## 2022-10-23 LAB — FERRITIN: Ferritin: 1128 ng/mL — ABNORMAL HIGH (ref 11–307)

## 2022-10-23 LAB — PREALBUMIN: Prealbumin: 24 mg/dL (ref 18–38)

## 2022-10-23 LAB — LACTATE DEHYDROGENASE: LDH: 218 U/L — ABNORMAL HIGH (ref 98–192)

## 2022-10-23 MED ORDER — SODIUM CHLORIDE 0.9 % IV SOLN
15.0000 mg/kg | Freq: Once | INTRAVENOUS | Status: AC
  Administered 2022-10-23: 1000 mg via INTRAVENOUS
  Filled 2022-10-23: qty 16

## 2022-10-23 MED ORDER — DIPHENHYDRAMINE HCL 50 MG/ML IJ SOLN
50.0000 mg | Freq: Once | INTRAMUSCULAR | Status: AC
  Administered 2022-10-23: 50 mg via INTRAVENOUS
  Filled 2022-10-23: qty 1

## 2022-10-23 MED ORDER — SODIUM CHLORIDE 0.9 % IV SOLN: Freq: Once | INTRAVENOUS | Status: AC

## 2022-10-23 MED ORDER — FAMOTIDINE IN NACL 20-0.9 MG/50ML-% IV SOLN
20.0000 mg | Freq: Once | INTRAVENOUS | Status: AC
  Administered 2022-10-23: 20 mg via INTRAVENOUS
  Filled 2022-10-23: qty 50

## 2022-10-23 MED ORDER — SODIUM CHLORIDE 0.9 % IV SOLN
150.0000 mg | Freq: Once | INTRAVENOUS | Status: AC
  Administered 2022-10-23: 150 mg via INTRAVENOUS
  Filled 2022-10-23: qty 150

## 2022-10-23 MED ORDER — PALONOSETRON HCL INJECTION 0.25 MG/5ML
0.2500 mg | Freq: Once | INTRAVENOUS | Status: AC
  Administered 2022-10-23: 0.25 mg via INTRAVENOUS
  Filled 2022-10-23: qty 5

## 2022-10-23 MED ORDER — SODIUM CHLORIDE 0.9 % IV SOLN
160.0000 mg/m2 | Freq: Once | INTRAVENOUS | Status: AC
  Administered 2022-10-23: 276 mg via INTRAVENOUS
  Filled 2022-10-23: qty 46

## 2022-10-23 MED ORDER — SODIUM CHLORIDE 0.9 % IV SOLN
316.8000 mg | Freq: Once | INTRAVENOUS | Status: AC
  Administered 2022-10-23: 320 mg via INTRAVENOUS
  Filled 2022-10-23: qty 32

## 2022-10-23 MED ORDER — HEPARIN SOD (PORK) LOCK FLUSH 100 UNIT/ML IV SOLN
500.0000 [IU] | Freq: Once | INTRAVENOUS | Status: AC | PRN
  Administered 2022-10-23: 500 [IU]

## 2022-10-23 MED ORDER — SODIUM CHLORIDE 0.9 % IV SOLN
1200.0000 mg | Freq: Once | INTRAVENOUS | Status: AC
  Administered 2022-10-23: 1200 mg via INTRAVENOUS
  Filled 2022-10-23: qty 20

## 2022-10-23 MED ORDER — SODIUM CHLORIDE 0.9 % IV SOLN
10.0000 mg | Freq: Once | INTRAVENOUS | Status: AC
  Administered 2022-10-23: 10 mg via INTRAVENOUS
  Filled 2022-10-23: qty 10

## 2022-10-23 MED ORDER — SODIUM CHLORIDE 0.9% FLUSH
10.0000 mL | INTRAVENOUS | Status: DC | PRN
  Administered 2022-10-23: 10 mL

## 2022-10-23 NOTE — Progress Notes (Signed)
Hematology and Oncology Follow Up Visit  Rhonda Blackwell [id number] 1951-02-10 72 y.o. 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 twice daily     Interim History:  Rhonda Blackwell is in for follow-up.  She had her first cycle of FOLFOX chemotherapy we last saw her.  She she tolerated this quite well.  She she is home now.  She was in a [employer].  So happy that she is able to be home.  She is eating okay.  Family says that she is eating better.  I am happy about this.  She is not wearing any oxygen.  She is not have any problems with pain.  She is having no problems with bowels or bladder.  There is no diarrhea.  She has had no mouth sores.  There is no headache.  She has had no cough.  There is been no bleeding.  She is on Eliquis because of atrial fibrillation.  Overall, I would have to say that her performance status is probably ECOG 1-2.    Medications:  Current Outpatient Medications:    acetaminophen (TYLENOL) 650 MG CR tablet, Take 650 mg by mouth every 8 (eight) hours as needed for pain., Disp: , Rfl:    albuterol (VENTOLIN HFA) 108 (90 Base) MCG/ACT inhaler, Inhale into the lungs every 6 (six) hours as needed for wheezing or shortness of breath., Disp: , Rfl:    amiodarone (PACERONE) 200 MG tablet, Take 1 tablet (200 mg total) by mouth daily., Disp: 30 tablet, Rfl: 0   amiodarone (PACERONE) 200 MG tablet, Take 1 tablet (200 mg total) by mouth every evening for 10 days., Disp: 10 tablet, Rfl: 0   apixaban (ELIQUIS) 5 MG TABS tablet, Take 1 tablet (5 mg total) by mouth 2 (two) times daily., Disp: 60 tablet, Rfl: 0   apixaban (ELIQUIS) 5 MG TABS tablet, Take 5 mg by mouth 2 (two) times daily., Disp: , Rfl:    atorvastatin (LIPITOR) 40 MG  tablet, Take 40 mg by mouth daily., Disp: , Rfl:    bisacodyl (DULCOLAX) 5 MG EC tablet, Take 1 tablet (5 mg total) by mouth daily as needed for moderate constipation., Disp: 30 tablet, Rfl: 0   dexamethasone (DECADRON) 4 MG tablet, Take 2 tablets (8mg ) by mouth daily starting the day after carboplatin for 3 days. Take with food (Patient not taking: Reported on 10/02/2022), Disp: 30 tablet, Rfl: 1   diltiazem (CARDIZEM CD) 120 MG 24 hr capsule, Take 1 capsule (120 mg total) by mouth daily., Disp: 30 capsule, Rfl: 0   DULoxetine (CYMBALTA) 60 MG capsule, Take 60 mg by mouth 2 (two) times daily., Disp: , Rfl:    fluticasone (FLONASE) 50 MCG/ACT nasal spray, Place 2 sprays into both nostrils daily., Disp: , Rfl:    fluticasone-salmeterol (ADVAIR) 250-50 MCG/ACT AEPB, Inhale 1 puff into the lungs 2 (two) times daily as needed (wheezing, shortness of breath.)., Disp: , Rfl:    furosemide (LASIX) 10 MG/ML injection, Inject 4 mLs (40 mg total) into the vein daily. (Patient not taking: Reported on 10/02/2022), Disp: 4 mL, Rfl: 0   furosemide (LASIX) 40 MG tablet, Take 40 mg by mouth daily., Disp: , Rfl:    guaiFENesin (MUCINEX) 600 MG 12 hr tablet, Take by mouth 2 (two) times daily., Disp: , Rfl:  HYDROcodone-acetaminophen (NORCO/VICODIN) 5-325 MG tablet, Take 1 tablet by mouth every 4 (four) hours as needed for moderate pain., Disp: , Rfl:    insulin aspart (NOVOLOG) 100 UNIT/ML injection, Inject 0-6 Units into the skin 3 (three) times daily with meals., Disp: 10 mL, Rfl: 11   JANUMET 50-500 MG tablet, Take 1 tablet by mouth 2 (two) times daily. (Patient not taking: Reported on 10/02/2022), Disp: , Rfl:    labetalol (NORMODYNE) 100 MG tablet, Take 100 mg by mouth 2 (two) times daily., Disp: , Rfl:    lidocaine-prilocaine (EMLA) cream, Apply to affected area one hour prior to accessing port on treatment days., Disp: 30 g, Rfl: 3   magnesium oxide (MAG-OX) 400 (240 Mg) MG tablet, Take 400 mg by mouth 2 (two)  times daily., Disp: , Rfl:    ondansetron (ZOFRAN) 8 MG tablet, Take 1 tablet (8 mg total) by mouth every 8 (eight) hours as needed for nausea or vomiting. Start on the third day after carboplatin., Disp: 30 tablet, Rfl: 1   pantoprazole (PROTONIX) 40 MG tablet, Take 40 mg by mouth daily., Disp: , Rfl:    polyethylene glycol (MIRALAX / GLYCOLAX) 17 g packet, Take 17 g by mouth 2 (two) times daily., Disp: 14 each, Rfl: 0   POTASSIUM CHLORIDE CRYS ER PO, Take 20 mEq by mouth daily., Disp: , Rfl:    prochlorperazine (COMPAZINE) 10 MG tablet, Take 1 tablet (10 mg total) by mouth every 6 (six) hours as needed for nausea or vomiting. (Patient not taking: Reported on 10/02/2022), Disp: 30 tablet, Rfl: 1  Allergies:  Allergies  Allergen Reactions   Bystolic [Nebivolol Hcl] Nausea Only and Other (See Comments)    Abdominal pain   Hydrochlorothiazide Nausea Only and Swelling    Leg swelling   Norvasc [Amlodipine] Swelling    Leg swelling    Past Medical History, Surgical history, Social history, and Family History were reviewed and updated.  Review of Systems: Review of Systems  Constitutional:  Positive for fatigue.  HENT:  Negative.    Eyes: Negative.   Respiratory:  Positive for cough and shortness of breath.   Cardiovascular: Negative.   Gastrointestinal: Negative.   Endocrine: Negative.   Genitourinary: Negative.    Musculoskeletal: Negative.   Skin: Negative.   Neurological: Negative.   Hematological: Negative.   Psychiatric/Behavioral: Negative.      Physical Exam:  weight is 136 lb 1.9 oz (61.7 kg). Her oral temperature is 98.2 F (36.8 C). Her blood pressure is 130/54 (abnormal) and her pulse is 81. Her respiration is 20 and oxygen saturation is 98%.   Wt Readings from Last 3 Encounters:  10/23/22 136 lb 1.9 oz (61.7 kg)  10/02/22 136 lb (61.7 kg)  09/04/22 146 lb 9.7 oz (66.5 kg)    Physical Exam Vitals reviewed.  Constitutional:      Comments: Fairly well-developed  and well-nourished Asian female in no obvious distress.    HENT:     Head: Normocephalic and atraumatic.  Eyes:     Pupils: Pupils are equal, round, and reactive to light.  Cardiovascular:     Rate and Rhythm: Normal rate and regular rhythm.     Heart sounds: Normal heart sounds.     Comments: Cardiac exam is regular rate and rhythm.  I really do not hear any atrial fibrillation. Pulmonary:     Effort: Pulmonary effort is normal.     Breath sounds: Normal breath sounds.     Comments: Lungs sound  clear on the right side.  She has decent breath sounds on the left side.  I do not hear any wheezing. Abdominal:     General: Bowel sounds are normal.     Palpations: Abdomen is soft.  Musculoskeletal:        General: No tenderness or deformity. Normal range of motion.     Cervical back: Normal range of motion.  Lymphadenopathy:     Cervical: No cervical adenopathy.  Skin:    General: Skin is warm and dry.     Findings: No erythema or rash.  Neurological:     Mental Status: She is alert and oriented to person, place, and time.  Psychiatric:        Behavior: Behavior normal.        Thought Content: Thought content normal.        Judgment: Judgment normal.     Lab Results  Component Value Date   WBC 9.8 10/23/2022   HGB 9.8 (L) 10/23/2022   HCT 31.2 (L) 10/23/2022   MCV 78.4 (L) 10/23/2022   PLT 440 (H) 10/23/2022     Chemistry      Component Value Date/Time   NA 131 (L) 10/02/2022 0940   K 4.4 10/02/2022 0940   CL 92 (L) 10/02/2022 0940   CO2 31 10/02/2022 0940   BUN 15 10/02/2022 0940   CREATININE 0.86 10/02/2022 0940      Component Value Date/Time   CALCIUM 8.9 10/02/2022 0940   CALCIUM 7.0 (L) 09/05/2022 0839   ALKPHOS 156 (H) 10/02/2022 0940   AST 11 (L) 10/02/2022 0940   ALT 11 10/02/2022 0940   BILITOT 0.5 10/02/2022 0940       Impression and Plan: Ms. Barylski is a very charming 72 year old Asian female.  I think she is originally from [place].  She has  metastatic adenocarcinoma of the lung.  She had palliative radiation therapy with low-dose chemotherapy in the hospital.  Hopefully, this is helped with her left lung and hopefully help with any kind of postobstructive pneumonia.  I do believe that she is responding.  We will go ahead with a PET scan after this cycle of treatment.  Will have to get this at [facility].  Her last PET scan  was done back in ovember.  Again, her quality of life is what we are focused on.  I just want her to have a better quality of life.  We will go with her second cycle of chemotherapy along with Avastin/Tecentriq.  I do think that we have to have her on a good dose as she is tolerating this quite well.  We will plan for a PET scan in 2 weeks or so.  I will plan to get her back in 3 weeks.   Josph Macho, MD 5/6/20249:06 AM

## 2022-10-23 NOTE — Telephone Encounter (Signed)
Pt here for MD appt. Call placed to [facility] health care center regarding pt discharge and instructions as pt and family members are unclear on about the information.Spoke with DON Pam at facility who advised she would fax pt discharge instructions and any orders relating to pts port flushes or IVF if administered.

## 2022-10-23 NOTE — Progress Notes (Signed)
Patient here for cycle two. She is now home after being discharged from the rehab facility.   She will need a PET after this cycle. Her first one was completed at [facility] and her follow up will need to be there as well. Can only schedule once Berkley Harvey is obtained. Will follow for auth/scheduling.   Oncology Nurse Navigator Documentation     10/23/2022    9:00 AM  Oncology Nurse Navigator Flowsheets  Navigator Follow Up Date: 10/25/2022  Navigator Follow Up Reason: Radiology  Navigator Location [facility]  Navigator Encounter Type Treatment;Appt/Treatment Plan Review  Patient Visit Type MedOnc  Treatment Phase Active Tx  Barriers/Navigation Needs Coordination of Care  Interventions Psycho-Social Support  Acuity Level 2-Minimal Needs (1-2 Barriers Identified)  Support Groups/Services Friends and Family  Time Spent with Patient 15

## 2022-10-23 NOTE — Patient Instructions (Signed)
[facility]  Discharge Instructions: Thank you for choosing [facility] to provide your oncology and hematology care.   If you have a lab appointment with the Cancer Center, please go directly to the Cancer Center and check in at the registration area.  Wear comfortable clothing and clothing appropriate for easy access to any Portacath or PICC line.   We strive to give you quality time with your provider. You may need to reschedule your appointment if you arrive late (15 or more minutes).  Arriving late affects you and other patients whose appointments are after yours.  Also, if you miss three or more appointments without notifying the office, you may be dismissed from the clinic at the provider's discretion.      For prescription refill requests, have your pharmacy contact our office and allow 72 hours for refills to be completed.    Today you received the following chemotherapy and/or immunotherapy agents:  Tecentriq, Vegzelma, Carboplatin and Taxol.       To help prevent nausea and vomiting after your treatment, we encourage you to take your nausea medication as directed.  BELOW ARE SYMPTOMS THAT SHOULD BE REPORTED IMMEDIATELY: *FEVER GREATER THAN 100.4 F (38 C) OR HIGHER *CHILLS OR SWEATING *NAUSEA AND VOMITING THAT IS NOT CONTROLLED WITH YOUR NAUSEA MEDICATION *UNUSUAL SHORTNESS OF BREATH *UNUSUAL BRUISING OR BLEEDING *URINARY PROBLEMS (pain or burning when urinating, or frequent urination) *BOWEL PROBLEMS (unusual diarrhea, constipation, pain near the anus) TENDERNESS IN MOUTH AND THROAT WITH OR WITHOUT PRESENCE OF ULCERS (sore throat, sores in mouth, or a toothache) UNUSUAL RASH, SWELLING OR PAIN  UNUSUAL VAGINAL DISCHARGE OR ITCHING   Items with * indicate a potential emergency and should be followed up as soon as possible or go to the Emergency Department if any problems should occur.  Please show the CHEMOTHERAPY ALERT CARD  or IMMUNOTHERAPY ALERT CARD at check-in to the Emergency Department and triage nurse. Should you have questions after your visit or need to cancel or reschedule your appointment, please contact [facility] [place]  [phone] and follow the prompts.  Office hours are 8:00 a.m. to 4:30 p.m. Monday - Friday. Please note that voicemails left after 4:00 p.m. may not be returned until the following business day.  We are closed weekends and major holidays. You have access to a nurse at all times for urgent questions. Please call the main number to the clinic [phone] and follow the prompts.  For any non-urgent questions, you may also contact your provider using MyChart. We now offer e-Visits for anyone 16 and older to request care online for non-urgent symptoms. For details visit [email or web address].   Also download the MyChart app! Go to the app store, search "MyChart", open the app, select [place], and log in with your MyChart username and password.

## 2022-10-23 NOTE — Progress Notes (Signed)
On arrival to office, pt with huber needle intact to her right chest dated 09/28/22 with the initials of MA.  Huber needle deaccessed and site slightly reddened with no drainage or tenderness per pt.  Port re-accessed with no difficulties with positive blood return.

## 2022-10-25 ENCOUNTER — Inpatient Hospital Stay: Payer: Medicare Other

## 2022-10-25 ENCOUNTER — Encounter: Payer: Self-pay | Admitting: *Deleted

## 2022-10-25 VITALS — BP 145/69 | HR 80 | Temp 98.7°F | Resp 17

## 2022-10-25 DIAGNOSIS — Z5112 Encounter for antineoplastic immunotherapy: Secondary | ICD-10-CM | POA: Diagnosis not present

## 2022-10-25 DIAGNOSIS — C3492 Malignant neoplasm of unspecified part of left bronchus or lung: Secondary | ICD-10-CM

## 2022-10-25 MED ORDER — PEGFILGRASTIM-CBQV 6 MG/0.6ML ~~LOC~~ SOSY
6.0000 mg | PREFILLED_SYRINGE | Freq: Once | SUBCUTANEOUS | Status: AC
  Administered 2022-10-25: 6 mg via SUBCUTANEOUS
  Filled 2022-10-25: qty 0.6

## 2022-10-25 NOTE — Patient Instructions (Signed)

## 2022-10-25 NOTE — Progress Notes (Signed)
PET order and demo sheet routed to [facility] at [phone].  Oncology Nurse Navigator Documentation     10/25/2022   12:45 PM  Oncology Nurse Navigator Flowsheets  Navigator Follow Up Date: 10/27/2022  Navigator Follow Up Reason: Radiology  Navigator Location [facility]  Navigator Encounter Type Appt/Treatment Plan Review  Patient Visit Type MedOnc  Treatment Phase Active Tx  Barriers/Navigation Needs Coordination of Care  Interventions Coordination of Care  Acuity Level 2-Minimal Needs (1-2 Barriers Identified)  Coordination of Care Radiology  Support Groups/Services Friends and Family  Time Spent with Patient 15

## 2022-11-02 ENCOUNTER — Encounter: Payer: Self-pay | Admitting: *Deleted

## 2022-11-02 NOTE — Progress Notes (Signed)
Patient scheduled her PET for [facility]. She is scheduled on 11/17/2022 however her next treatment date is 5/28-5/30. Spoke with Dr Myna Hidalgo to make sure this is okay. He is okay with this scheduling.   Oncology Nurse Navigator Documentation     11/02/2022    3:00 PM  Oncology Nurse Navigator Flowsheets  Navigator Follow Up Date: 11/14/2022  Navigator Follow Up Reason: Follow-up Appointment;Chemotherapy  Navigator Location [facility]  Navigator Encounter Type Appt/Treatment Plan Review  Patient Visit Type MedOnc  Treatment Phase Active Tx  Barriers/Navigation Needs Coordination of Care  Interventions Coordination of Care  Acuity Level 2-Minimal Needs (1-2 Barriers Identified)  Coordination of Care Other  Support Groups/Services Friends and Family  Time Spent with Patient 15

## 2022-11-07 ENCOUNTER — Encounter: Payer: Self-pay | Admitting: Hematology & Oncology

## 2022-11-14 ENCOUNTER — Other Ambulatory Visit: Payer: Self-pay

## 2022-11-14 ENCOUNTER — Encounter: Payer: Self-pay | Admitting: *Deleted

## 2022-11-14 ENCOUNTER — Inpatient Hospital Stay: Payer: Medicare Other

## 2022-11-14 ENCOUNTER — Encounter: Payer: Self-pay | Admitting: Hematology & Oncology

## 2022-11-14 ENCOUNTER — Inpatient Hospital Stay (HOSPITAL_BASED_OUTPATIENT_CLINIC_OR_DEPARTMENT_OTHER): Payer: Medicare Other | Admitting: Hematology & Oncology

## 2022-11-14 VITALS — BP 172/74 | HR 79 | Temp 97.5°F | Resp 18 | Ht 64.0 in | Wt 137.4 lb

## 2022-11-14 VITALS — BP 178/76 | HR 96

## 2022-11-14 DIAGNOSIS — C3492 Malignant neoplasm of unspecified part of left bronchus or lung: Secondary | ICD-10-CM | POA: Diagnosis not present

## 2022-11-14 DIAGNOSIS — Z5112 Encounter for antineoplastic immunotherapy: Secondary | ICD-10-CM | POA: Diagnosis not present

## 2022-11-14 LAB — CBC WITH DIFFERENTIAL (CANCER CENTER ONLY)
Abs Immature Granulocytes: 0.03 10*3/uL (ref 0.00–0.07)
Basophils Absolute: 0.1 10*3/uL (ref 0.0–0.1)
Basophils Relative: 1 %
Eosinophils Absolute: 0 10*3/uL (ref 0.0–0.5)
Eosinophils Relative: 0 %
HCT: 32.7 % — ABNORMAL LOW (ref 36.0–46.0)
Hemoglobin: 10.1 g/dL — ABNORMAL LOW (ref 12.0–15.0)
Immature Granulocytes: 0 %
Lymphocytes Relative: 14 %
Lymphs Abs: 1.3 10*3/uL (ref 0.7–4.0)
MCH: 26 pg (ref 26.0–34.0)
MCHC: 30.9 g/dL (ref 30.0–36.0)
MCV: 84.1 fL (ref 80.0–100.0)
Monocytes Absolute: 0.8 10*3/uL (ref 0.1–1.0)
Monocytes Relative: 8 %
Neutro Abs: 7.1 10*3/uL (ref 1.7–7.7)
Neutrophils Relative %: 77 %
Platelet Count: 305 10*3/uL (ref 150–400)
RBC: 3.89 MIL/uL (ref 3.87–5.11)
RDW: 22.4 % — ABNORMAL HIGH (ref 11.5–15.5)
WBC Count: 9.2 10*3/uL (ref 4.0–10.5)
nRBC: 0 % (ref 0.0–0.2)

## 2022-11-14 LAB — CMP (CANCER CENTER ONLY)
ALT: 7 U/L (ref 0–44)
AST: 11 U/L — ABNORMAL LOW (ref 15–41)
Albumin: 3.9 g/dL (ref 3.5–5.0)
Alkaline Phosphatase: 88 U/L (ref 38–126)
Anion gap: 10 (ref 5–15)
BUN: 11 mg/dL (ref 8–23)
CO2: 27 mmol/L (ref 22–32)
Calcium: 9.1 mg/dL (ref 8.9–10.3)
Chloride: 100 mmol/L (ref 98–111)
Creatinine: 0.66 mg/dL (ref 0.44–1.00)
GFR, Estimated: >60 mL/min (ref >60)
Glucose, Bld: 125 mg/dL — ABNORMAL HIGH (ref 70–99)
Potassium: 3.3 mmol/L — ABNORMAL LOW (ref 3.5–5.1)
Sodium: 137 mmol/L (ref 135–145)
Total Bilirubin: 0.6 mg/dL (ref 0.3–1.2)
Total Protein: 6.3 g/dL — ABNORMAL LOW (ref 6.5–8.1)

## 2022-11-14 LAB — IRON AND IRON BINDING CAPACITY (CC-WL,HP ONLY)
Iron: 73 ug/dL (ref 28–170)
Saturation Ratios: 25 % (ref 10.4–31.8)
TIBC: 295 ug/dL (ref 250–450)
UIBC: 222 ug/dL (ref 148–442)

## 2022-11-14 LAB — SAMPLE TO BLOOD BANK

## 2022-11-14 LAB — LACTATE DEHYDROGENASE: LDH: 178 U/L (ref 98–192)

## 2022-11-14 LAB — PREALBUMIN: Prealbumin: 30 mg/dL (ref 18–38)

## 2022-11-14 LAB — FERRITIN: Ferritin: 554 ng/mL — ABNORMAL HIGH (ref 11–307)

## 2022-11-14 MED ORDER — FAMOTIDINE IN NACL 20-0.9 MG/50ML-% IV SOLN
20.0000 mg | Freq: Once | INTRAVENOUS | Status: AC
  Administered 2022-11-14: 20 mg via INTRAVENOUS
  Filled 2022-11-14: qty 50

## 2022-11-14 MED ORDER — SODIUM CHLORIDE 0.9 % IV SOLN: Freq: Once | INTRAVENOUS | Status: AC

## 2022-11-14 MED ORDER — SODIUM CHLORIDE 0.9 % IV SOLN
320.0000 mg | Freq: Once | INTRAVENOUS | Status: AC
  Administered 2022-11-14: 320 mg via INTRAVENOUS
  Filled 2022-11-14: qty 32

## 2022-11-14 MED ORDER — PALONOSETRON HCL INJECTION 0.25 MG/5ML
0.2500 mg | Freq: Once | INTRAVENOUS | Status: AC
  Administered 2022-11-14: 0.25 mg via INTRAVENOUS
  Filled 2022-11-14: qty 5

## 2022-11-14 MED ORDER — HEPARIN SOD (PORK) LOCK FLUSH 100 UNIT/ML IV SOLN: 500.0000 [IU] | Freq: Once | INTRAVENOUS | Status: DC | PRN

## 2022-11-14 MED ORDER — SODIUM CHLORIDE 0.9 % IV SOLN
150.0000 mg | Freq: Once | INTRAVENOUS | Status: AC
  Administered 2022-11-14: 150 mg via INTRAVENOUS
  Filled 2022-11-14: qty 150

## 2022-11-14 MED ORDER — SODIUM CHLORIDE 0.9 % IV SOLN
160.0000 mg/m2 | Freq: Once | INTRAVENOUS | Status: AC
  Administered 2022-11-14: 276 mg via INTRAVENOUS
  Filled 2022-11-14: qty 46

## 2022-11-14 MED ORDER — DIPHENHYDRAMINE HCL 50 MG/ML IJ SOLN
50.0000 mg | Freq: Once | INTRAMUSCULAR | Status: AC
  Administered 2022-11-14: 50 mg via INTRAVENOUS
  Filled 2022-11-14: qty 1

## 2022-11-14 MED ORDER — SODIUM CHLORIDE 0.9 % IV SOLN
1200.0000 mg | Freq: Once | INTRAVENOUS | Status: AC
  Administered 2022-11-14: 1200 mg via INTRAVENOUS
  Filled 2022-11-14: qty 20

## 2022-11-14 MED ORDER — SODIUM CHLORIDE 0.9 % IV SOLN
10.0000 mg | Freq: Once | INTRAVENOUS | Status: AC
  Administered 2022-11-14: 10 mg via INTRAVENOUS
  Filled 2022-11-14: qty 10

## 2022-11-14 MED ORDER — SODIUM CHLORIDE 0.9 % IV SOLN
15.0000 mg/kg | Freq: Once | INTRAVENOUS | Status: AC
  Administered 2022-11-14: 1000 mg via INTRAVENOUS
  Filled 2022-11-14: qty 40

## 2022-11-14 MED ORDER — SODIUM CHLORIDE 0.9% FLUSH: 10.0000 mL | INTRAVENOUS | Status: DC | PRN

## 2022-11-14 NOTE — Progress Notes (Signed)
Patient is cleared for next cycle. She is scheduled for PET this Friday.   Oncology Nurse Navigator Documentation     11/14/2022    8:45 AM  Oncology Nurse Navigator Flowsheets  Navigator Follow Up Date: 11/17/2022  Navigator Follow Up Reason: Scan Review  Navigator Location [facility]  Navigator Encounter Type Follow-up Appt  Patient Visit Type MedOnc  Treatment Phase Active Tx  Barriers/Navigation Needs No Barriers At This Time  Interventions None Required  Acuity Level 1-No Barriers  Support Groups/Services Friends and Family  Time Spent with Patient 15

## 2022-11-14 NOTE — Patient Instructions (Signed)

## 2022-11-14 NOTE — Progress Notes (Signed)
Hematology and Oncology Follow Up Visit  Rhonda Blackwell [id number] 01/04/1951 72 y.o. 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 twice daily     Interim History:  Rhonda Blackwell is in for follow-up.  So far, she is doing pretty well.  I think she is tolerated chemotherapy okay.  1 problem is that she is quite hoarse.  I suspect that she probably has vocal cord paralysis.  We may have to get her to ENT to have this evaluated.  She apparently has a PET scan set up for this Friday.  We will have to see how that looks.  She is eating okay.  Her weight is holding stabile.  She has had no problems with bowels or bladder.  She may have some loose stool.  I told her to try some Imodium or some Pepto-Bismol.  She has had no fever.  There has been no bleeding.  She is on Eliquis.  She has chronic atrial fibrillation.    There has been no problems with pain.  Overall, I would say that her performance status is probably ECOG 1.    Medications:  Current Outpatient Medications:    acetaminophen (TYLENOL) 650 MG CR tablet, Take 650 mg by mouth every 8 (eight) hours as needed for pain., Disp: , Rfl:    albuterol (VENTOLIN HFA) 108 (90 Base) MCG/ACT inhaler, Inhale into the lungs every 6 (six) hours as needed for wheezing or shortness of breath., Disp: , Rfl:    amiodarone (PACERONE) 200 MG tablet, Take 1 tablet (200 mg total) by mouth daily., Disp: 30 tablet, Rfl: 0   apixaban (ELIQUIS) 5 MG TABS tablet, Take 5 mg by mouth 2 (two) times daily., Disp: , Rfl:    atorvastatin (LIPITOR) 40 MG tablet, Take 40 mg by mouth daily., Disp: , Rfl:    bisacodyl (DULCOLAX) 5 MG EC tablet, Take 1 tablet (5 mg total) by mouth daily as needed for moderate constipation., Disp:  30 tablet, Rfl: 0   dexamethasone (DECADRON) 4 MG tablet, Take 2 tablets (8mg ) by mouth daily starting the day after carboplatin for 3 days. Take with food, Disp: 30 tablet, Rfl: 1   DULoxetine (CYMBALTA) 60 MG capsule, Take 60 mg by mouth 2 (two) times daily., Disp: , Rfl:    fluticasone (FLONASE) 50 MCG/ACT nasal spray, Place 2 sprays into both nostrils daily., Disp: , Rfl:    fluticasone-salmeterol (ADVAIR) 250-50 MCG/ACT AEPB, Inhale 1 puff into the lungs 2 (two) times daily as needed (wheezing, shortness of breath.)., Disp: , Rfl:    furosemide (LASIX) 40 MG tablet, Take 40 mg by mouth daily., Disp: , Rfl:    guaiFENesin (MUCINEX) 600 MG 12 hr tablet, Take by mouth 2 (two) times daily., Disp: , Rfl:    HYDROcodone-acetaminophen (NORCO/VICODIN) 5-325 MG tablet, Take 1 tablet by mouth every 4 (four) hours as needed for moderate pain., Disp: , Rfl:    insulin aspart (NOVOLOG) 100 UNIT/ML injection, Inject 0-6 Units into the skin 3 (three) times daily with meals., Disp: 10 mL, Rfl: 11   JANUMET 50-500 MG tablet, Take 1 tablet by mouth 2 (two) times daily., Disp: , Rfl:    labetalol (NORMODYNE) 100 MG tablet, Take 100 mg by mouth 2 (two) times daily., Disp: , Rfl:    lidocaine-prilocaine (EMLA) cream,  Apply to affected area one hour prior to accessing port on treatment days., Disp: 30 g, Rfl: 3   magnesium oxide (MAG-OX) 400 (240 Mg) MG tablet, Take 400 mg by mouth 2 (two) times daily., Disp: , Rfl:    pantoprazole (PROTONIX) 40 MG tablet, Take 40 mg by mouth daily., Disp: , Rfl:    polyethylene glycol (MIRALAX / GLYCOLAX) 17 g packet, Take 17 g by mouth 2 (two) times daily., Disp: 14 each, Rfl: 0   POTASSIUM CHLORIDE CRYS ER PO, Take 20 mEq by mouth daily., Disp: , Rfl:    ondansetron (ZOFRAN) 8 MG tablet, Take 1 tablet (8 mg total) by mouth every 8 (eight) hours as needed for nausea or vomiting. Start on the third day after carboplatin. (Patient not taking: Reported on 11/14/2022), Disp: 30 tablet,  Rfl: 1   prochlorperazine (COMPAZINE) 10 MG tablet, Take 1 tablet (10 mg total) by mouth every 6 (six) hours as needed for nausea or vomiting. (Patient not taking: Reported on 10/02/2022), Disp: 30 tablet, Rfl: 1  Allergies:  Allergies  Allergen Reactions   Bystolic [Nebivolol Hcl] Nausea Only and Other (See Comments)    Abdominal pain   Hydrochlorothiazide Nausea Only and Swelling    Leg swelling   Norvasc [Amlodipine] Swelling    Leg swelling    Past Medical History, Surgical history, Social history, and Family History were reviewed and updated.  Review of Systems: Review of Systems  Constitutional:  Positive for fatigue.  HENT:  Negative.    Eyes: Negative.   Respiratory:  Positive for cough and shortness of breath.   Cardiovascular: Negative.   Gastrointestinal: Negative.   Endocrine: Negative.   Genitourinary: Negative.    Musculoskeletal: Negative.   Skin: Negative.   Neurological: Negative.   Hematological: Negative.   Psychiatric/Behavioral: Negative.      Physical Exam:  height is 5\' 4"  (1.626 m) and weight is 137 lb 6.4 oz (62.3 kg). Her oral temperature is 97.5 F (36.4 C) (abnormal). Her blood pressure is 172/74 (abnormal) and her pulse is 79. Her respiration is 18 and oxygen saturation is 99%.   Wt Readings from Last 3 Encounters:  11/14/22 137 lb 6.4 oz (62.3 kg)  10/23/22 136 lb 1.9 oz (61.7 kg)  10/02/22 136 lb (61.7 kg)    Physical Exam Vitals reviewed.  Constitutional:      Comments: Fairly well-developed and well-nourished Asian female in no obvious distress.    HENT:     Head: Normocephalic and atraumatic.  Eyes:     Pupils: Pupils are equal, round, and reactive to light.  Cardiovascular:     Rate and Rhythm: Normal rate and regular rhythm.     Heart sounds: Normal heart sounds.     Comments: Cardiac exam is regular rate and rhythm.  I really do not hear any atrial fibrillation. Pulmonary:     Effort: Pulmonary effort is normal.     Breath  sounds: Normal breath sounds.     Comments: Lungs sound clear on the right side.  She has decent breath sounds on the left side.  I do not hear any wheezing. Abdominal:     General: Bowel sounds are normal.     Palpations: Abdomen is soft.  Musculoskeletal:        General: No tenderness or deformity. Normal range of motion.     Cervical back: Normal range of motion.  Lymphadenopathy:     Cervical: No cervical adenopathy.  Skin:    General:  Skin is warm and dry.     Findings: No erythema or rash.  Neurological:     Mental Status: She is alert and oriented to person, place, and time.  Psychiatric:        Behavior: Behavior normal.        Thought Content: Thought content normal.        Judgment: Judgment normal.     Lab Results  Component Value Date   WBC 9.2 11/14/2022   HGB 10.1 (L) 11/14/2022   HCT 32.7 (L) 11/14/2022   MCV 84.1 11/14/2022   PLT 305 11/14/2022     Chemistry      Component Value Date/Time   NA 137 11/14/2022 0830   K 3.3 (L) 11/14/2022 0830   CL 100 11/14/2022 0830   CO2 27 11/14/2022 0830   BUN 11 11/14/2022 0830   CREATININE 0.66 11/14/2022 0830      Component Value Date/Time   CALCIUM 9.1 11/14/2022 0830   CALCIUM 7.0 (L) 09/05/2022 0839   ALKPHOS 88 11/14/2022 0830   AST 11 (L) 11/14/2022 0830   ALT 7 11/14/2022 0830   BILITOT 0.6 11/14/2022 0830       Impression and Plan: Ms. Cazier is a very charming 72 year old Asian female.  I think she is originally from [place].  She has metastatic adenocarcinoma of the lung.  She had palliative radiation therapy with low-dose chemotherapy in the hospital.  Hopefully, this is helped with her left lung and hopefully help with any kind of postobstructive pneumonia.  I do believe that she is responding.  We will go ahead with a PET scan after this cycle of treatment.  Will have to get this at [facility].  Her last PET scan  was done back in November.  I will have to see about making  referral to ENT.  Again, I suspect that she probably has vocal cord paralysis.  Otherwise, she seems to be managing pretty well.  I will plan to get her back in another 3 weeks.    Josph Macho, MD 5/28/20249:39 AM

## 2022-11-16 ENCOUNTER — Inpatient Hospital Stay: Payer: Medicare Other

## 2022-11-16 VITALS — BP 152/92 | HR 84 | Resp 18

## 2022-11-16 DIAGNOSIS — Z5112 Encounter for antineoplastic immunotherapy: Secondary | ICD-10-CM | POA: Diagnosis not present

## 2022-11-16 DIAGNOSIS — C3492 Malignant neoplasm of unspecified part of left bronchus or lung: Secondary | ICD-10-CM

## 2022-11-16 MED ORDER — PEGFILGRASTIM-CBQV 6 MG/0.6ML ~~LOC~~ SOSY
6.0000 mg | PREFILLED_SYRINGE | Freq: Once | SUBCUTANEOUS | Status: AC
  Administered 2022-11-16: 6 mg via SUBCUTANEOUS
  Filled 2022-11-16: qty 0.6

## 2022-11-16 NOTE — Patient Instructions (Signed)

## 2022-11-17 ENCOUNTER — Ambulatory Visit (HOSPITAL_COMMUNITY)
Admission: RE | Admit: 2022-11-17 | Discharge: 2022-11-17 | Disposition: A | Discharge status: Home / Self Care | Payer: Medicare Other | Source: Ambulatory Visit | Attending: Hematology & Oncology | Admitting: Hematology & Oncology

## 2022-11-17 DIAGNOSIS — C342 Malignant neoplasm of middle lobe, bronchus or lung: Secondary | ICD-10-CM | POA: Diagnosis not present

## 2022-11-17 DIAGNOSIS — C3402 Malignant neoplasm of left main bronchus: Secondary | ICD-10-CM | POA: Diagnosis not present

## 2022-11-17 DIAGNOSIS — C3492 Malignant neoplasm of unspecified part of left bronchus or lung: Secondary | ICD-10-CM | POA: Diagnosis present

## 2022-11-17 DIAGNOSIS — C7951 Secondary malignant neoplasm of bone: Secondary | ICD-10-CM | POA: Insufficient documentation

## 2022-11-17 LAB — GLUCOSE, CAPILLARY: Glucose-Capillary: 138 mg/dL — ABNORMAL HIGH (ref 70–99)

## 2022-11-17 MED ORDER — FLUDEOXYGLUCOSE F - 18 (FDG) INJECTION
6.5000 | Freq: Once | INTRAVENOUS | Status: AC
  Administered 2022-11-17: 6.82 via INTRAVENOUS

## 2022-11-18 ENCOUNTER — Telehealth: Payer: Self-pay | Admitting: Nurse Practitioner

## 2022-11-18 NOTE — Telephone Encounter (Signed)
Spoke with patient husband confirming upcoming appointment  

## 2022-11-27 ENCOUNTER — Encounter: Payer: Self-pay | Admitting: *Deleted

## 2022-11-27 ENCOUNTER — Telehealth: Payer: Self-pay

## 2022-11-27 NOTE — Telephone Encounter (Signed)
-----   Message from Josph Macho, MD sent at 11/27/2022 10:40 AM EDT ----- Please please call let her know that the PET scan is fantastic.  There is very little activity that is left with her cancer.  This is fantastic.

## 2022-11-27 NOTE — Progress Notes (Signed)
Reviewed PET which shows good treatment response.   Oncology Nurse Navigator Documentation     11/27/2022   10:45 AM  Oncology Nurse Navigator Flowsheets  Navigator Follow Up Date: 12/04/2022  Navigator Follow Up Reason: Follow-up Appointment  Navigator Location [facility]  Navigator Encounter Type Scan Review  Patient Visit Type MedOnc  Treatment Phase Active Tx  Barriers/Navigation Needs No Barriers At This Time  Interventions None Required  Acuity Level 1-No Barriers  Support Groups/Services Friends and Family  Time Spent with Patient 15

## 2022-11-29 NOTE — Progress Notes (Signed)
Palliative Medicine [facility]  [phone] [phone]   Name: Terie Lear Date: 11/29/2022 MRN: [id number]  DOB: [birth year]  Patient Care Team: Loyal Jacobson, MD as PCP - General (Family Medicine) Myna Hidalgo Rose Phi, MD as Medical Oncologist (Oncology) Gwendel Hanson, RN as Oncology Nurse Navigator    REASON FOR CONSULTATION: Cartier Mapel is a 72 y.o. 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  Palliative ask to see for symptom management and goals of care.    SOCIAL HISTORY:     reports that she quit smoking about 5 months ago. Her smoking use included cigarettes. She has a 54.00 pack-year smoking history. She has never used smokeless tobacco. She reports that she does not drink alcohol and does not use drugs.  ADVANCE DIRECTIVES:  None on file  CODE STATUS: Full code  PAST MEDICAL HISTORY: Past Medical History:  Diagnosis Date   Bronchitis    COPD (chronic obstructive pulmonary disease) (HCC)    Diabetes mellitus without complication (HCC)    Hypertension    Lung cancer, primary, with metastasis from lung to other site, left (HCC) 09/04/2022    PAST SURGICAL HISTORY:  Past Surgical History:  Procedure Laterality Date   CESAREAN SECTION     CHOLECYSTECTOMY     FLEXIBLE SIGMOIDOSCOPY N/A 09/08/2022   Procedure: FLEXIBLE SIGMOIDOSCOPY;  Surgeon: Jeani Hawking, MD;  Location: [facility];  Service: Gastroenterology;  Laterality: N/A;   FOOT SURGERY     HERNIA REPAIR     IR IMAGING GUIDED PORT INSERTION  09/04/2022    HEMATOLOGY/ONCOLOGY HISTORY:  Oncology History  Lung cancer, primary, with metastasis from lung to other site, left (HCC)  09/04/2022 Initial Diagnosis   Lung cancer, primary, with metastasis from lung to other site, left (HCC)   09/04/2022 Cancer Staging   Staging form:  Lung, AJCC 8th Edition - Clinical stage from 09/04/2022: Stage IV (cT3, cN3, pM1) - Signed by Josph Macho, MD on 09/04/2022 Stage prefix: Initial diagnosis Histologic grade (G): G3 Histologic grading system: 4 grade system   09/05/2022 - 09/13/2022 Chemotherapy   Patient is on Treatment Plan : ESOPHAGUS Carboplatin + Paclitaxel Weekly X 6 Weeks with XRT     10/02/2022 -  Chemotherapy   Patient is on Treatment Plan : LUNG Atezolizumab  + Bevacizumab + CARBOplatin + PACLitaxel q21d Induction X 6 Cycles / Atezolizumab + Bevacizumab q21d Maintenance       ALLERGIES:  is allergic to bystolic [nebivolol hcl], hydrochlorothiazide, and norvasc [amlodipine].  MEDICATIONS:  Current Outpatient Medications  Medication Sig Dispense Refill   acetaminophen (TYLENOL) 650 MG CR tablet Take 650 mg by mouth every 8 (eight) hours as needed for pain.     albuterol (VENTOLIN HFA) 108 (90 Base) MCG/ACT inhaler Inhale into the lungs every 6 (six) hours as needed for wheezing or shortness of breath.     amiodarone (PACERONE) 200 MG tablet Take 1 tablet (200 mg total) by mouth daily. 30 tablet 0   apixaban (ELIQUIS) 5 MG TABS tablet Take 5 mg by mouth 2 (two) times daily.     atorvastatin (LIPITOR) 40 MG tablet Take 40 mg by mouth daily.     bisacodyl (DULCOLAX) 5 MG EC tablet Take 1 tablet (5 mg total) by mouth daily as needed for moderate constipation. 30 tablet 0   dexamethasone (DECADRON) 4  MG tablet Take 2 tablets (8mg ) by mouth daily starting the day after carboplatin for 3 days. Take with food 30 tablet 1   DULoxetine (CYMBALTA) 60 MG capsule Take 60 mg by mouth 2 (two) times daily.     fluticasone (FLONASE) 50 MCG/ACT nasal spray Place 2 sprays into both nostrils daily.     fluticasone-salmeterol (ADVAIR) 250-50 MCG/ACT AEPB Inhale 1 puff into the lungs 2 (two) times daily as needed (wheezing, shortness of breath.).     furosemide (LASIX) 40 MG tablet Take 40 mg by mouth daily.     guaiFENesin (MUCINEX)  600 MG 12 hr tablet Take by mouth 2 (two) times daily.     HYDROcodone-acetaminophen (NORCO/VICODIN) 5-325 MG tablet Take 1 tablet by mouth every 4 (four) hours as needed for moderate pain.     insulin aspart (NOVOLOG) 100 UNIT/ML injection Inject 0-6 Units into the skin 3 (three) times daily with meals. 10 mL 11   JANUMET 50-500 MG tablet Take 1 tablet by mouth 2 (two) times daily.     labetalol (NORMODYNE) 100 MG tablet Take 100 mg by mouth 2 (two) times daily.     lidocaine-prilocaine (EMLA) cream Apply to affected area one hour prior to accessing port on treatment days. 30 g 3   magnesium oxide (MAG-OX) 400 (240 Mg) MG tablet Take 400 mg by mouth 2 (two) times daily.     ondansetron (ZOFRAN) 8 MG tablet Take 1 tablet (8 mg total) by mouth every 8 (eight) hours as needed for nausea or vomiting. Start on the third day after carboplatin. (Patient not taking: Reported on 11/14/2022) 30 tablet 1   pantoprazole (PROTONIX) 40 MG tablet Take 40 mg by mouth daily.     polyethylene glycol (MIRALAX / GLYCOLAX) 17 g packet Take 17 g by mouth 2 (two) times daily. 14 each 0   POTASSIUM CHLORIDE CRYS ER PO Take 20 mEq by mouth daily.     prochlorperazine (COMPAZINE) 10 MG tablet Take 1 tablet (10 mg total) by mouth every 6 (six) hours as needed for nausea or vomiting. (Patient not taking: Reported on 10/02/2022) 30 tablet 1   No current facility-administered medications for this visit.    VITAL SIGNS: There were no vitals taken for this visit. There were no vitals filed for this visit.  Estimated body mass index is 23.58 kg/m as calculated from the following:   Height as of 11/14/22: 5\' 4"  (1.626 m).   Weight as of 11/14/22: 137 lb 6.4 oz (62.3 kg).  LABS: CBC:    Component Value Date/Time   WBC 9.2 11/14/2022 0830   WBC 10.9 (H) 09/14/2022 0148   HGB 10.1 (L) 11/14/2022 0830   HCT 32.7 (L) 11/14/2022 0830   PLT 305 11/14/2022 0830   MCV 84.1 11/14/2022 0830   NEUTROABS 7.1 11/14/2022 0830    LYMPHSABS 1.3 11/14/2022 0830   MONOABS 0.8 11/14/2022 0830   EOSABS 0.0 11/14/2022 0830   BASOSABS 0.1 11/14/2022 0830   Comprehensive Metabolic Panel:    Component Value Date/Time   NA 137 11/14/2022 0830   K 3.3 (L) 11/14/2022 0830   CL 100 11/14/2022 0830   CO2 27 11/14/2022 0830   BUN 11 11/14/2022 0830   CREATININE 0.66 11/14/2022 0830   GLUCOSE 125 (H) 11/14/2022 0830   CALCIUM 9.1 11/14/2022 0830   CALCIUM 7.0 (L) 09/05/2022 0839   AST 11 (L) 11/14/2022 0830   ALT 7 11/14/2022 0830   ALKPHOS 88 11/14/2022 0830  BILITOT 0.6 11/14/2022 0830   PROT 6.3 (L) 11/14/2022 0830   ALBUMIN 3.9 11/14/2022 0830    RADIOGRAPHIC STUDIES: NM PET Image Restag (PS) Skull Base To Thigh  Result Date: 11/27/2022 CLINICAL DATA:  Initial treatment strategy for small cell lung cancer. EXAM: NUCLEAR MEDICINE PET SKULL BASE TO THIGH TECHNIQUE: 6.8 mCi F-18 FDG was injected intravenously. Full-ring PET imaging was performed from the skull base to thigh after the radiotracer. CT data was obtained and used for attenuation correction and anatomic localization. Fasting blood glucose: 138 mg/dl COMPARISON:  Bone scan 09/01/2022, CT chest 08/29/2022, CT abdomen pelvis 07/23/2022. Brain MR 09/02/2022. FINDINGS: Mediastinal blood pool activity: SUV max 2.3 Liver activity: SUV max NA NECK: Mild asymmetric left nasopharyngeal soft tissue uptake without a CT correlate. Otherwise, no abnormal hypermetabolism. Incidental CT findings: None. CHEST: Overall improved masslike consolidation in the left upper lobe. Minimal hypermetabolism associated with residual soft tissue thickening and architectural distortion in the posterior left upper lobe (7/22), SUV max 2.9. No additional abnormal hypermetabolism. Incidental CT findings: Right IJ Port-A-Cath terminates at the SVC RA junction. Atherosclerotic calcification of the aorta, aortic valve and coronary arteries. Enlarged pulmonic trunk and heart. No pericardial or pleural  effusion. Left-sided talc pleurodesis. Centrilobular emphysema. ABDOMEN/PELVIS: No abnormal hypermetabolism in the liver, adrenal glands, spleen or pancreas. Focal hypermetabolism in the rectum, SUV max 14.7, with possible wall thickening. No hypermetabolic lymph nodes. Incidental CT findings: Liver is unremarkable. Cholecystectomy. Adrenal glands are unremarkable. Renal vascular calcifications and probable tiny renal stones bilaterally. Spleen, pancreas, stomach and bowel are otherwise grossly unremarkable. Fat density mass in the uterus measures 1.9 x 3.8 cm (4/159), present dating back to at least 08/02/2015 and therefore considered benign. SKELETON: Lytic lesions are seen throughout the visualized skeleton with some having increased peripheral sclerosis. Numerous healing or healed pathologic rib fractures. Widespread patchy hypermetabolism with index SUV max 8.2 in the L1 vertebral body. Incidental CT findings: Degenerative changes in the spine. IMPRESSION: 1. No residual hypermetabolic adenopathy. Interval improvement in masslike consolidation in the left upper lobe and associated lymphangitic carcinomatosis with minimal residual hypermetabolism. 2. Diffuse osseous metastatic disease, with areas of increased sclerosis and diffuse patchy hypermetabolism, suggesting healing. 3. Rectal hypermetabolism with possible wall thickening. Please correlate clinically as malignancy cannot be excluded. 4. Probable bilateral renal stones. 5. Aortic atherosclerosis (ICD10-I70.0). Coronary artery calcification. 6. Enlarged pulmonic trunk, indicative of pulmonary arterial hypertension. 7.  Emphysema (ICD10-J43.9). Electronically Signed   By: Leanna Battles M.D.   On: 11/27/2022 08:33    PERFORMANCE STATUS (ECOG) : 1 - Symptomatic but completely ambulatory  Review of Systems  Constitutional:  Positive for activity change and fatigue.  Unless otherwise noted, a complete review of systems is negative.  Physical  Exam General: NAD Cardiovascular: regular rate and rhythm Pulmonary: clear ant fields Abdomen: soft, nontender, + bowel sounds Extremities: no edema, no joint deformities Skin: no rashes Neurological: AAOx4  IMPRESSION: This is my initial visit with Mrs. Lindeman here at [facility]. She was initially seen by our team during her hospitalization in March 2024. Patient's husband is present. Patient is alert and able to engage appropriately in discussions.   I introduced myself, Maygan RN, and Palliative's role in collaboration with the oncology team. Concept of Palliative Care was introduced as specialized medical care for people and their families living with serious illness.  It focuses on providing relief from the symptoms and stress of a serious illness.  The goal is to improve quality of life  for both the patient and the family. Values and goals of care important to patient and family were attempted to be elicited.   Mrs. Lenox lives in the home with her husband of more than 50 years. They have 2 children and 8 grandchildren. She is originally from [place]. 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 Patient enjoys being outside and spending time in her floral garden.   At home patient is ambulatory with a walker. She utilizes a wheelchair outside of the home if long distance ambulation is required. Appetite is slowly improving. Independent of all ADLs with some limitations due to fatigue.   Mrs. Rosenberry denies nausea, vomiting, constipation. Loves [place] food. Occasional loose stools. Recently discontinued use of home oxygen 2 mos ago. Denies any shortness of breath. Ongoing fatigue. Is trying to increase activity.   No symptom management needs at this time.   We discussed her current illness and what it means in the larger context of her on-going co-morbidities. Natural disease trajectory and expectations were discussed.  Patient and husband are clear  in their understanding in her disease. She is tolerating treatment without difficulty. They are taking life one day at a time. Continue to treat the treatable allowing her every opportunity to thrive.  I discussed the importance of continued conversation with family and their medical providers regarding overall plan of care and treatment options, ensuring decisions are within the context of the patients values and GOCs.  PLAN: Established therapeutic relationship. Education provided on palliative's role in collaboration with their Oncology/Radiation team. No symptom management need I will plan to see patient back in 6-8 weeks in collaboration to other oncology appointments.    Patient expressed understanding and was in agreement with this plan. She also understands that She can call the clinic at any time with any questions, concerns, or complaints.   Thank you for your referral and allowing Palliative to assist in Mrs. Yaritzi Mandt's care.   Number and complexity of problems addressed: HIGH - 1 or more chronic illnesses with SEVERE exacerbation, progression, or side effects of treatment - advanced cancer, pain. Any controlled substances utilized were prescribed in the context of palliative care.   Visit consisted of counseling and education dealing with the complex and emotionally intense issues of symptom management and palliative care in the setting of serious and potentially life-threatening illness.Greater than 50%  of this time was spent counseling and coordinating care related to the above assessment and plan.  Signed by: Willette Alma, AGPCNP-BC Palliative Medicine Team/[facility]   *Please note that this is a verbal dictation therefore any spelling or grammatical errors are due to the "Dragon Medical One" system interpretation.

## 2022-11-30 ENCOUNTER — Encounter: Payer: Self-pay | Admitting: Nurse Practitioner

## 2022-11-30 ENCOUNTER — Other Ambulatory Visit: Payer: Self-pay

## 2022-11-30 ENCOUNTER — Inpatient Hospital Stay: Payer: Medicare Other | Attending: Hematology & Oncology | Admitting: Nurse Practitioner

## 2022-11-30 VITALS — BP 132/74 | HR 73 | Temp 98.4°F | Resp 17 | Ht 64.0 in | Wt 134.1 lb

## 2022-11-30 DIAGNOSIS — I4891 Unspecified atrial fibrillation: Secondary | ICD-10-CM | POA: Insufficient documentation

## 2022-11-30 DIAGNOSIS — Z923 Personal history of irradiation: Secondary | ICD-10-CM | POA: Insufficient documentation

## 2022-11-30 DIAGNOSIS — C3490 Malignant neoplasm of unspecified part of unspecified bronchus or lung: Secondary | ICD-10-CM | POA: Diagnosis not present

## 2022-11-30 DIAGNOSIS — R530 Neoplastic (malignant) related fatigue: Secondary | ICD-10-CM

## 2022-11-30 DIAGNOSIS — Z5111 Encounter for antineoplastic chemotherapy: Secondary | ICD-10-CM | POA: Insufficient documentation

## 2022-11-30 DIAGNOSIS — Z7901 Long term (current) use of anticoagulants: Secondary | ICD-10-CM | POA: Insufficient documentation

## 2022-11-30 DIAGNOSIS — C7951 Secondary malignant neoplasm of bone: Secondary | ICD-10-CM | POA: Insufficient documentation

## 2022-11-30 DIAGNOSIS — Z515 Encounter for palliative care: Secondary | ICD-10-CM

## 2022-11-30 DIAGNOSIS — R059 Cough, unspecified: Secondary | ICD-10-CM | POA: Insufficient documentation

## 2022-11-30 DIAGNOSIS — Z7189 Other specified counseling: Secondary | ICD-10-CM | POA: Diagnosis not present

## 2022-11-30 DIAGNOSIS — Z79899 Other long term (current) drug therapy: Secondary | ICD-10-CM | POA: Insufficient documentation

## 2022-11-30 DIAGNOSIS — Z5112 Encounter for antineoplastic immunotherapy: Secondary | ICD-10-CM | POA: Insufficient documentation

## 2022-11-30 DIAGNOSIS — R5383 Other fatigue: Secondary | ICD-10-CM | POA: Insufficient documentation

## 2022-11-30 DIAGNOSIS — C3402 Malignant neoplasm of left main bronchus: Secondary | ICD-10-CM | POA: Insufficient documentation

## 2022-11-30 DIAGNOSIS — R0602 Shortness of breath: Secondary | ICD-10-CM | POA: Insufficient documentation

## 2022-12-01 ENCOUNTER — Other Ambulatory Visit: Payer: Self-pay | Admitting: *Deleted

## 2022-12-01 MED ORDER — APIXABAN 5 MG PO TABS: 5.0000 mg | ORAL_TABLET | Freq: Two times a day (BID) | ORAL | 1 refills | Status: DC

## 2022-12-04 ENCOUNTER — Other Ambulatory Visit: Payer: Self-pay

## 2022-12-04 ENCOUNTER — Encounter: Payer: Self-pay | Admitting: *Deleted

## 2022-12-04 ENCOUNTER — Encounter: Payer: Self-pay | Admitting: Hematology & Oncology

## 2022-12-04 ENCOUNTER — Inpatient Hospital Stay (HOSPITAL_BASED_OUTPATIENT_CLINIC_OR_DEPARTMENT_OTHER): Payer: Medicare Other | Admitting: Hematology & Oncology

## 2022-12-04 ENCOUNTER — Inpatient Hospital Stay: Payer: Medicare Other

## 2022-12-04 VITALS — BP 107/64 | HR 65 | Temp 98.2°F | Resp 16 | Ht 64.0 in | Wt 133.0 lb

## 2022-12-04 VITALS — BP 148/80 | HR 79 | Resp 19

## 2022-12-04 DIAGNOSIS — R5383 Other fatigue: Secondary | ICD-10-CM | POA: Diagnosis not present

## 2022-12-04 DIAGNOSIS — C3492 Malignant neoplasm of unspecified part of left bronchus or lung: Secondary | ICD-10-CM

## 2022-12-04 DIAGNOSIS — I4891 Unspecified atrial fibrillation: Secondary | ICD-10-CM | POA: Diagnosis not present

## 2022-12-04 DIAGNOSIS — R530 Neoplastic (malignant) related fatigue: Secondary | ICD-10-CM

## 2022-12-04 DIAGNOSIS — Z79899 Other long term (current) drug therapy: Secondary | ICD-10-CM | POA: Diagnosis not present

## 2022-12-04 DIAGNOSIS — R0602 Shortness of breath: Secondary | ICD-10-CM | POA: Diagnosis not present

## 2022-12-04 DIAGNOSIS — Z7901 Long term (current) use of anticoagulants: Secondary | ICD-10-CM | POA: Diagnosis not present

## 2022-12-04 DIAGNOSIS — C3402 Malignant neoplasm of left main bronchus: Secondary | ICD-10-CM | POA: Diagnosis present

## 2022-12-04 DIAGNOSIS — Z5111 Encounter for antineoplastic chemotherapy: Secondary | ICD-10-CM | POA: Diagnosis present

## 2022-12-04 DIAGNOSIS — Z923 Personal history of irradiation: Secondary | ICD-10-CM | POA: Diagnosis not present

## 2022-12-04 DIAGNOSIS — R059 Cough, unspecified: Secondary | ICD-10-CM | POA: Diagnosis not present

## 2022-12-04 DIAGNOSIS — C7951 Secondary malignant neoplasm of bone: Secondary | ICD-10-CM | POA: Diagnosis not present

## 2022-12-04 DIAGNOSIS — Z5112 Encounter for antineoplastic immunotherapy: Secondary | ICD-10-CM | POA: Diagnosis present

## 2022-12-04 LAB — CBC WITH DIFFERENTIAL (CANCER CENTER ONLY)
Abs Immature Granulocytes: 0.03 10*3/uL (ref 0.00–0.07)
Basophils Absolute: 0.1 10*3/uL (ref 0.0–0.1)
Basophils Relative: 1 %
Eosinophils Absolute: 0 10*3/uL (ref 0.0–0.5)
Eosinophils Relative: 0 %
HCT: 33.8 % — ABNORMAL LOW (ref 36.0–46.0)
Hemoglobin: 10.5 g/dL — ABNORMAL LOW (ref 12.0–15.0)
Immature Granulocytes: 0 %
Lymphocytes Relative: 16 %
Lymphs Abs: 1.2 10*3/uL (ref 0.7–4.0)
MCH: 26.9 pg (ref 26.0–34.0)
MCHC: 31.1 g/dL (ref 30.0–36.0)
MCV: 86.4 fL (ref 80.0–100.0)
Monocytes Absolute: 0.6 10*3/uL (ref 0.1–1.0)
Monocytes Relative: 9 %
Neutro Abs: 5.5 10*3/uL (ref 1.7–7.7)
Neutrophils Relative %: 74 %
Platelet Count: 249 10*3/uL (ref 150–400)
RBC: 3.91 MIL/uL (ref 3.87–5.11)
RDW: 20.2 % — ABNORMAL HIGH (ref 11.5–15.5)
WBC Count: 7.5 10*3/uL (ref 4.0–10.5)
nRBC: 0 % (ref 0.0–0.2)

## 2022-12-04 LAB — CMP (CANCER CENTER ONLY)
ALT: 7 U/L (ref 0–44)
AST: 10 U/L — ABNORMAL LOW (ref 15–41)
Albumin: 3.9 g/dL (ref 3.5–5.0)
Alkaline Phosphatase: 67 U/L (ref 38–126)
Anion gap: 8 (ref 5–15)
BUN: 15 mg/dL (ref 8–23)
CO2: 29 mmol/L (ref 22–32)
Calcium: 9.3 mg/dL (ref 8.9–10.3)
Chloride: 101 mmol/L (ref 98–111)
Creatinine: 0.8 mg/dL (ref 0.44–1.00)
GFR, Estimated: >60 mL/min (ref >60)
Glucose, Bld: 112 mg/dL — ABNORMAL HIGH (ref 70–99)
Potassium: 4.1 mmol/L (ref 3.5–5.1)
Sodium: 138 mmol/L (ref 135–145)
Total Bilirubin: 0.6 mg/dL (ref 0.3–1.2)
Total Protein: 6.3 g/dL — ABNORMAL LOW (ref 6.5–8.1)

## 2022-12-04 LAB — PREALBUMIN: Prealbumin: 30 mg/dL (ref 18–38)

## 2022-12-04 LAB — TOTAL PROTEIN, URINE DIPSTICK: Protein, ur: NEGATIVE mg/dL

## 2022-12-04 LAB — LACTATE DEHYDROGENASE: LDH: 154 U/L (ref 98–192)

## 2022-12-04 MED ORDER — SODIUM CHLORIDE 0.9% FLUSH
10.0000 mL | INTRAVENOUS | Status: DC | PRN
  Administered 2022-12-04: 10 mL

## 2022-12-04 MED ORDER — FAMOTIDINE IN NACL 20-0.9 MG/50ML-% IV SOLN
20.0000 mg | Freq: Once | INTRAVENOUS | Status: AC
  Administered 2022-12-04: 20 mg via INTRAVENOUS
  Filled 2022-12-04: qty 50

## 2022-12-04 MED ORDER — SODIUM CHLORIDE 0.9 % IV SOLN
316.8000 mg | Freq: Once | INTRAVENOUS | Status: AC
  Administered 2022-12-04: 320 mg via INTRAVENOUS
  Filled 2022-12-04: qty 32

## 2022-12-04 MED ORDER — SODIUM CHLORIDE 0.9 % IV SOLN: Freq: Once | INTRAVENOUS | Status: AC

## 2022-12-04 MED ORDER — SODIUM CHLORIDE 0.9 % IV SOLN
1200.0000 mg | Freq: Once | INTRAVENOUS | Status: AC
  Administered 2022-12-04: 1200 mg via INTRAVENOUS
  Filled 2022-12-04: qty 20

## 2022-12-04 MED ORDER — SODIUM CHLORIDE 0.9 % IV SOLN
150.0000 mg | Freq: Once | INTRAVENOUS | Status: AC
  Administered 2022-12-04: 150 mg via INTRAVENOUS
  Filled 2022-12-04: qty 150

## 2022-12-04 MED ORDER — SODIUM CHLORIDE 0.9 % IV SOLN
160.0000 mg/m2 | Freq: Once | INTRAVENOUS | Status: AC
  Administered 2022-12-04: 276 mg via INTRAVENOUS
  Filled 2022-12-04: qty 46

## 2022-12-04 MED ORDER — APIXABAN 5 MG PO TABS: 5.0000 mg | ORAL_TABLET | Freq: Two times a day (BID) | ORAL | 3 refills | Status: DC

## 2022-12-04 MED ORDER — SODIUM CHLORIDE 0.9 % IV SOLN
15.0000 mg/kg | Freq: Once | INTRAVENOUS | Status: AC
  Administered 2022-12-04: 1000 mg via INTRAVENOUS
  Filled 2022-12-04: qty 40

## 2022-12-04 MED ORDER — PALONOSETRON HCL INJECTION 0.25 MG/5ML
0.2500 mg | Freq: Once | INTRAVENOUS | Status: AC
  Administered 2022-12-04: 0.25 mg via INTRAVENOUS
  Filled 2022-12-04: qty 5

## 2022-12-04 MED ORDER — DIPHENHYDRAMINE HCL 50 MG/ML IJ SOLN
50.0000 mg | Freq: Once | INTRAMUSCULAR | Status: AC
  Administered 2022-12-04: 50 mg via INTRAVENOUS
  Filled 2022-12-04: qty 1

## 2022-12-04 MED ORDER — HEPARIN SOD (PORK) LOCK FLUSH 100 UNIT/ML IV SOLN
500.0000 [IU] | Freq: Once | INTRAVENOUS | Status: AC | PRN
  Administered 2022-12-04: 500 [IU]

## 2022-12-04 MED ORDER — SODIUM CHLORIDE 0.9 % IV SOLN
10.0000 mg | Freq: Once | INTRAVENOUS | Status: AC
  Administered 2022-12-04: 10 mg via INTRAVENOUS
  Filled 2022-12-04: qty 10

## 2022-12-04 NOTE — Patient Instructions (Signed)

## 2022-12-04 NOTE — Progress Notes (Unsigned)
Patient had good treatment response per recent PET. Will continue current treatment.   Oncology Nurse Navigator Documentation     12/04/2022   10:00 AM  Oncology Nurse Navigator Flowsheets  Navigator Follow Up Date: 12/26/2022  Navigator Follow Up Reason: Follow-up Appointment;Chemotherapy  Navigator Location [facility]  Navigator Encounter Type Treatment;Appt/Treatment Plan Review  Patient Visit Type MedOnc  Treatment Phase Active Tx  Barriers/Navigation Needs No Barriers At This Time  Interventions Psycho-Social Support  Acuity Level 1-No Barriers  Support Groups/Services Friends and Family  Time Spent with Patient 15

## 2022-12-04 NOTE — Progress Notes (Signed)
Hematology and Oncology Follow Up Visit  Rhonda Blackwell [id number] [birth year] 72 y.o. 12/04/2022   Principle Diagnosis:  Stage IV adenocarcinoma of the lung -no actionable mutations Atrial fibrillation  Current Therapy:   Status post XRT/carboplatinum/Taxol --completed on 09/13/2022 Carboplatinum/Taxol/Avastin/Tecentriq -s/p cycle #3 -- start on 10/02/2022 Eliquis 5 mg p.o. twice daily     Interim History:  Rhonda Blackwell is in for follow-up.  I most said that she really is doing quite nicely.  We did do a PET scan on her.  This was done on 11/17/2022.  This showed that she has had a very nice response so far.  There is no residual hypermetabolic adenopathy.  She had interval improvement in the left upper lobe and associated lymphangitic carcinomatosis.  She had the metastatic disease to her bones which appear to be with increased sclerosis.  She is eating well.  She had a nice weekend.  There is no complaints of pain.  She has had no increased cough or shortness of breath.  There is no bleeding.  She is on Eliquis for the atrial fibrillation.  There has been no problems with fever.  She has had no change in bowel or bladder habits.  There is been no leg swelling.  Overall, I would say that her performance status is probably ECOG 1.   Medications:  Current Outpatient Medications:    acetaminophen (TYLENOL) 650 MG CR tablet, Take 650 mg by mouth every 8 (eight) hours as needed for pain., Disp: , Rfl:    albuterol (VENTOLIN HFA) 108 (90 Base) MCG/ACT inhaler, Inhale into the lungs every 6 (six) hours as needed for wheezing or shortness of breath., Disp: , Rfl:    amiodarone (PACERONE) 200 MG tablet, Take 1 tablet (200 mg total) by mouth daily., Disp: 30 tablet, Rfl: 0   apixaban (ELIQUIS) 5 MG TABS tablet, Take 1 tablet (5 mg total) by mouth 2 (two) times daily., Disp: 60 tablet, Rfl: 1   atorvastatin (LIPITOR) 40 MG tablet, Take 40 mg by mouth daily., Disp: , Rfl:    bisacodyl (DULCOLAX) 5 MG  EC tablet, Take 1 tablet (5 mg total) by mouth daily as needed for moderate constipation., Disp: 30 tablet, Rfl: 0   dexamethasone (DECADRON) 4 MG tablet, Take 2 tablets (8mg ) by mouth daily starting the day after carboplatin for 3 days. Take with food, Disp: 30 tablet, Rfl: 1   DULoxetine (CYMBALTA) 60 MG capsule, Take 60 mg by mouth 2 (two) times daily., Disp: , Rfl:    fluticasone (FLONASE) 50 MCG/ACT nasal spray, Place 2 sprays into both nostrils daily., Disp: , Rfl:    fluticasone-salmeterol (ADVAIR) 250-50 MCG/ACT AEPB, Inhale 1 puff into the lungs 2 (two) times daily as needed (wheezing, shortness of breath.)., Disp: , Rfl:    furosemide (LASIX) 40 MG tablet, Take 40 mg by mouth daily., Disp: , Rfl:    guaiFENesin (MUCINEX) 600 MG 12 hr tablet, Take by mouth 2 (two) times daily., Disp: , Rfl:    HYDROcodone-acetaminophen (NORCO/VICODIN) 5-325 MG tablet, Take 1 tablet by mouth every 4 (four) hours as needed for moderate pain., Disp: , Rfl:    insulin aspart (NOVOLOG) 100 UNIT/ML injection, Inject 0-6 Units into the skin 3 (three) times daily with meals., Disp: 10 mL, Rfl: 11   JANUMET 50-500 MG tablet, Take 1 tablet by mouth 2 (two) times daily., Disp: , Rfl:    labetalol (NORMODYNE) 100 MG tablet, Take 100 mg by mouth 2 (two) times daily., Disp: ,  Rfl:    lidocaine-prilocaine (EMLA) cream, Apply to affected area one hour prior to accessing port on treatment days., Disp: 30 g, Rfl: 3   magnesium oxide (MAG-OX) 400 (240 Mg) MG tablet, Take 400 mg by mouth 2 (two) times daily., Disp: , Rfl:    ondansetron (ZOFRAN) 8 MG tablet, Take 1 tablet (8 mg total) by mouth every 8 (eight) hours as needed for nausea or vomiting. Start on the third day after carboplatin. (Patient not taking: Reported on 11/14/2022), Disp: 30 tablet, Rfl: 1   pantoprazole (PROTONIX) 40 MG tablet, Take 40 mg by mouth daily., Disp: , Rfl:    polyethylene glycol (MIRALAX / GLYCOLAX) 17 g packet, Take 17 g by mouth 2 (two) times  daily., Disp: 14 each, Rfl: 0   POTASSIUM CHLORIDE CRYS ER PO, Take 20 mEq by mouth daily., Disp: , Rfl:    prochlorperazine (COMPAZINE) 10 MG tablet, Take 1 tablet (10 mg total) by mouth every 6 (six) hours as needed for nausea or vomiting. (Patient not taking: Reported on 10/02/2022), Disp: 30 tablet, Rfl: 1  Allergies:  Allergies  Allergen Reactions   Bystolic [Nebivolol Hcl] Nausea Only and Other (See Comments)    Abdominal pain   Hydrochlorothiazide Nausea Only and Swelling    Leg swelling   Amlodipine Swelling and Other (See Comments)    Leg swelling  Abdominal pain   Nebivolol Nausea Only and Other (See Comments)    Abdominal pain    Past Medical History, Surgical history, Social history, and Family History were reviewed and updated.  Review of Systems: Review of Systems  Constitutional:  Positive for fatigue.  HENT:  Negative.    Eyes: Negative.   Respiratory:  Positive for cough and shortness of breath.   Cardiovascular: Negative.   Gastrointestinal: Negative.   Endocrine: Negative.   Genitourinary: Negative.    Musculoskeletal: Negative.   Skin: Negative.   Neurological: Negative.   Hematological: Negative.   Psychiatric/Behavioral: Negative.      Physical Exam:  height is 5\' 4"  (1.626 m) and weight is 133 lb (60.3 kg). Her oral temperature is 98.2 F (36.8 C). Her blood pressure is 107/64 and her pulse is 65. Her respiration is 16 and oxygen saturation is 97%.   Wt Readings from Last 3 Encounters:  12/04/22 133 lb (60.3 kg)  11/30/22 134 lb 1.6 oz (60.8 kg)  11/14/22 137 lb 6.4 oz (62.3 kg)    Physical Exam Vitals reviewed.  Constitutional:      Comments: Fairly well-developed and well-nourished Asian female in no obvious distress.    HENT:     Head: Normocephalic and atraumatic.  Eyes:     Pupils: Pupils are equal, round, and reactive to light.  Cardiovascular:     Rate and Rhythm: Normal rate and regular rhythm.     Heart sounds: Normal heart  sounds.     Comments: Cardiac exam is regular rate and rhythm.  I really do not hear any atrial fibrillation. Pulmonary:     Effort: Pulmonary effort is normal.     Breath sounds: Normal breath sounds.     Comments: Lungs sound clear on the right side.  She has decent breath sounds on the left side.  I do not hear any wheezing. Abdominal:     General: Bowel sounds are normal.     Palpations: Abdomen is soft.  Musculoskeletal:        General: No tenderness or deformity. Normal range of motion.  Cervical back: Normal range of motion.  Lymphadenopathy:     Cervical: No cervical adenopathy.  Skin:    General: Skin is warm and dry.     Findings: No erythema or rash.  Neurological:     Mental Status: She is alert and oriented to person, place, and time.  Psychiatric:        Behavior: Behavior normal.        Thought Content: Thought content normal.        Judgment: Judgment normal.     Lab Results  Component Value Date   WBC 7.5 12/04/2022   HGB 10.5 (L) 12/04/2022   HCT 33.8 (L) 12/04/2022   MCV 86.4 12/04/2022   PLT 249 12/04/2022     Chemistry      Component Value Date/Time   NA 138 12/04/2022 0850   K 4.1 12/04/2022 0850   CL 101 12/04/2022 0850   CO2 29 12/04/2022 0850   BUN 15 12/04/2022 0850   CREATININE 0.80 12/04/2022 0850      Component Value Date/Time   CALCIUM 9.3 12/04/2022 0850   CALCIUM 7.0 (L) 09/05/2022 0839   ALKPHOS 67 12/04/2022 0850   AST 10 (L) 12/04/2022 0850   ALT 7 12/04/2022 0850   BILITOT 0.6 12/04/2022 0850       Impression and Plan: Ms. Bruneau is a very charming 72 year old Asian female.  I think she is originally from [place].  She has metastatic adenocarcinoma of the lung.  She had palliative radiation therapy with low-dose chemotherapy in the hospital.  She now is on full dose chemotherapy.  Again she is responded incredibly well.  Her quality of life is better.  Her pulmonary status is also better.  I will give her 2 more  cycles of treatment with full dose chemoimmunotherapy.  After that, then we will see about getting her on a maintenance protocol.  We will plan to get her back in another 3 WEEKS.      Josph Macho, MD 6/17/20249:31 AM

## 2022-12-04 NOTE — Patient Instructions (Signed)
[facility]  Discharge Instructions: Thank you for choosing [facility] to provide your oncology and hematology care.   If you have a lab appointment with the Cancer Center, please go directly to the Cancer Center and check in at the registration area.  Wear comfortable clothing and clothing appropriate for easy access to any Portacath or PICC line.   We strive to give you quality time with your provider. You may need to reschedule your appointment if you arrive late (15 or more minutes).  Arriving late affects you and other patients whose appointments are after yours.  Also, if you miss three or more appointments without notifying the office, you may be dismissed from the clinic at the provider's discretion.      For prescription refill requests, have your pharmacy contact our office and allow 72 hours for refills to be completed.    Today you received the following chemotherapy and/or immunotherapy agents tecentriq, avastin,taxol, carboplatin      To help prevent nausea and vomiting after your treatment, we encourage you to take your nausea medication as directed.  BELOW ARE SYMPTOMS THAT SHOULD BE REPORTED IMMEDIATELY: *FEVER GREATER THAN 100.4 F (38 C) OR HIGHER *CHILLS OR SWEATING *NAUSEA AND VOMITING THAT IS NOT CONTROLLED WITH YOUR NAUSEA MEDICATION *UNUSUAL SHORTNESS OF BREATH *UNUSUAL BRUISING OR BLEEDING *URINARY PROBLEMS (pain or burning when urinating, or frequent urination) *BOWEL PROBLEMS (unusual diarrhea, constipation, pain near the anus) TENDERNESS IN MOUTH AND THROAT WITH OR WITHOUT PRESENCE OF ULCERS (sore throat, sores in mouth, or a toothache) UNUSUAL RASH, SWELLING OR PAIN  UNUSUAL VAGINAL DISCHARGE OR ITCHING   Items with * indicate a potential emergency and should be followed up as soon as possible or go to the Emergency Department if any problems should occur.  Please show the CHEMOTHERAPY ALERT CARD or  IMMUNOTHERAPY ALERT CARD at check-in to the Emergency Department and triage nurse. Should you have questions after your visit or need to cancel or reschedule your appointment, please contact [facility] [place]  [phone] and follow the prompts.  Office hours are 8:00 a.m. to 4:30 p.m. Monday - Friday. Please note that voicemails left after 4:00 p.m. may not be returned until the following business day.  We are closed weekends and major holidays. You have access to a nurse at all times for urgent questions. Please call the main number to the clinic [phone] and follow the prompts.  For any non-urgent questions, you may also contact your provider using MyChart. We now offer e-Visits for anyone 36 and older to request care online for non-urgent symptoms. For details visit [email or web address].   Also download the MyChart app! Go to the app store, search "MyChart", open the app, select [place], and log in with your MyChart username and password.

## 2022-12-06 ENCOUNTER — Inpatient Hospital Stay: Payer: Medicare Other

## 2022-12-06 VITALS — BP 124/72 | HR 76 | Temp 98.2°F | Resp 17

## 2022-12-06 DIAGNOSIS — Z5112 Encounter for antineoplastic immunotherapy: Secondary | ICD-10-CM | POA: Diagnosis not present

## 2022-12-06 DIAGNOSIS — Z95828 Presence of other vascular implants and grafts: Secondary | ICD-10-CM

## 2022-12-06 DIAGNOSIS — C3492 Malignant neoplasm of unspecified part of left bronchus or lung: Secondary | ICD-10-CM

## 2022-12-06 MED ORDER — PEGFILGRASTIM-CBQV 6 MG/0.6ML ~~LOC~~ SOSY
6.0000 mg | PREFILLED_SYRINGE | Freq: Once | SUBCUTANEOUS | Status: AC
  Administered 2022-12-06: 6 mg via SUBCUTANEOUS
  Filled 2022-12-06: qty 0.6

## 2022-12-06 MED ORDER — HEPARIN SOD (PORK) LOCK FLUSH 100 UNIT/ML IV SOLN: 500.0000 [IU] | Freq: Once | INTRAVENOUS | Status: DC

## 2022-12-06 MED ORDER — SODIUM CHLORIDE 0.9% FLUSH: 10.0000 mL | Freq: Once | INTRAVENOUS | Status: DC

## 2022-12-06 NOTE — Patient Instructions (Signed)

## 2022-12-26 ENCOUNTER — Inpatient Hospital Stay: Payer: Medicare Other | Attending: Hematology & Oncology

## 2022-12-26 ENCOUNTER — Inpatient Hospital Stay (HOSPITAL_BASED_OUTPATIENT_CLINIC_OR_DEPARTMENT_OTHER): Payer: Medicare Other | Admitting: Hematology & Oncology

## 2022-12-26 ENCOUNTER — Inpatient Hospital Stay: Payer: Medicare Other

## 2022-12-26 ENCOUNTER — Encounter: Payer: Self-pay | Admitting: Hematology & Oncology

## 2022-12-26 ENCOUNTER — Ambulatory Visit: Payer: Medicare Other

## 2022-12-26 ENCOUNTER — Other Ambulatory Visit: Payer: Self-pay

## 2022-12-26 ENCOUNTER — Encounter: Payer: Self-pay | Admitting: *Deleted

## 2022-12-26 VITALS — BP 128/70 | HR 70

## 2022-12-26 VITALS — BP 145/77 | HR 63 | Temp 98.0°F | Resp 18 | Ht 64.0 in | Wt 137.0 lb

## 2022-12-26 DIAGNOSIS — R0602 Shortness of breath: Secondary | ICD-10-CM | POA: Diagnosis not present

## 2022-12-26 DIAGNOSIS — I4891 Unspecified atrial fibrillation: Secondary | ICD-10-CM | POA: Insufficient documentation

## 2022-12-26 DIAGNOSIS — C3492 Malignant neoplasm of unspecified part of left bronchus or lung: Secondary | ICD-10-CM | POA: Diagnosis not present

## 2022-12-26 DIAGNOSIS — R5383 Other fatigue: Secondary | ICD-10-CM | POA: Diagnosis not present

## 2022-12-26 DIAGNOSIS — R059 Cough, unspecified: Secondary | ICD-10-CM | POA: Insufficient documentation

## 2022-12-26 DIAGNOSIS — Z515 Encounter for palliative care: Secondary | ICD-10-CM | POA: Diagnosis not present

## 2022-12-26 DIAGNOSIS — Z923 Personal history of irradiation: Secondary | ICD-10-CM | POA: Diagnosis not present

## 2022-12-26 DIAGNOSIS — R635 Abnormal weight gain: Secondary | ICD-10-CM | POA: Diagnosis not present

## 2022-12-26 DIAGNOSIS — Z7901 Long term (current) use of anticoagulants: Secondary | ICD-10-CM | POA: Diagnosis not present

## 2022-12-26 DIAGNOSIS — Z5111 Encounter for antineoplastic chemotherapy: Secondary | ICD-10-CM | POA: Insufficient documentation

## 2022-12-26 DIAGNOSIS — Z79620 Long term (current) use of immunosuppressive biologic: Secondary | ICD-10-CM | POA: Insufficient documentation

## 2022-12-26 DIAGNOSIS — Z79633 Long term (current) use of mitotic inhibitor: Secondary | ICD-10-CM | POA: Insufficient documentation

## 2022-12-26 DIAGNOSIS — Z79899 Other long term (current) drug therapy: Secondary | ICD-10-CM | POA: Insufficient documentation

## 2022-12-26 DIAGNOSIS — Z79630 Long term (current) use of alkylating agent: Secondary | ICD-10-CM | POA: Insufficient documentation

## 2022-12-26 DIAGNOSIS — C3402 Malignant neoplasm of left main bronchus: Secondary | ICD-10-CM | POA: Insufficient documentation

## 2022-12-26 DIAGNOSIS — Z5112 Encounter for antineoplastic immunotherapy: Secondary | ICD-10-CM | POA: Insufficient documentation

## 2022-12-26 LAB — CBC WITH DIFFERENTIAL (CANCER CENTER ONLY)
Abs Immature Granulocytes: 0.02 10*3/uL (ref 0.00–0.07)
Basophils Absolute: 0.1 10*3/uL (ref 0.0–0.1)
Basophils Relative: 1 %
Eosinophils Absolute: 0 10*3/uL (ref 0.0–0.5)
Eosinophils Relative: 1 %
HCT: 34.1 % — ABNORMAL LOW (ref 36.0–46.0)
Hemoglobin: 10.7 g/dL — ABNORMAL LOW (ref 12.0–15.0)
Immature Granulocytes: 0 %
Lymphocytes Relative: 15 %
Lymphs Abs: 1.1 10*3/uL (ref 0.7–4.0)
MCH: 27.9 pg (ref 26.0–34.0)
MCHC: 31.4 g/dL (ref 30.0–36.0)
MCV: 89 fL (ref 80.0–100.0)
Monocytes Absolute: 0.7 10*3/uL (ref 0.1–1.0)
Monocytes Relative: 10 %
Neutro Abs: 5.1 10*3/uL (ref 1.7–7.7)
Neutrophils Relative %: 73 %
Platelet Count: 254 10*3/uL (ref 150–400)
RBC: 3.83 MIL/uL — ABNORMAL LOW (ref 3.87–5.11)
RDW: 17.4 % — ABNORMAL HIGH (ref 11.5–15.5)
WBC Count: 7 10*3/uL (ref 4.0–10.5)
nRBC: 0 % (ref 0.0–0.2)

## 2022-12-26 LAB — LACTATE DEHYDROGENASE: LDH: 155 U/L (ref 98–192)

## 2022-12-26 LAB — CMP (CANCER CENTER ONLY)
ALT: 7 U/L (ref 0–44)
AST: 11 U/L — ABNORMAL LOW (ref 15–41)
Albumin: 3.9 g/dL (ref 3.5–5.0)
Alkaline Phosphatase: 74 U/L (ref 38–126)
Anion gap: 7 (ref 5–15)
BUN: 17 mg/dL (ref 8–23)
CO2: 29 mmol/L (ref 22–32)
Calcium: 9.7 mg/dL (ref 8.9–10.3)
Chloride: 98 mmol/L (ref 98–111)
Creatinine: 0.84 mg/dL (ref 0.44–1.00)
GFR, Estimated: >60 mL/min (ref >60)
Glucose, Bld: 106 mg/dL — ABNORMAL HIGH (ref 70–99)
Potassium: 4.3 mmol/L (ref 3.5–5.1)
Sodium: 134 mmol/L — ABNORMAL LOW (ref 135–145)
Total Bilirubin: 0.5 mg/dL (ref 0.3–1.2)
Total Protein: 6.7 g/dL (ref 6.5–8.1)

## 2022-12-26 LAB — SAMPLE TO BLOOD BANK

## 2022-12-26 MED ORDER — FAMOTIDINE IN NACL 20-0.9 MG/50ML-% IV SOLN
20.0000 mg | Freq: Once | INTRAVENOUS | Status: AC
  Administered 2022-12-26: 20 mg via INTRAVENOUS
  Filled 2022-12-26: qty 50

## 2022-12-26 MED ORDER — SODIUM CHLORIDE 0.9 % IV SOLN
316.8000 mg | Freq: Once | INTRAVENOUS | Status: AC
  Administered 2022-12-26: 320 mg via INTRAVENOUS
  Filled 2022-12-26: qty 32

## 2022-12-26 MED ORDER — SODIUM CHLORIDE 0.9 % IV SOLN
160.0000 mg/m2 | Freq: Once | INTRAVENOUS | Status: AC
  Administered 2022-12-26: 276 mg via INTRAVENOUS
  Filled 2022-12-26: qty 46

## 2022-12-26 MED ORDER — SODIUM CHLORIDE 0.9 % IV SOLN: Freq: Once | INTRAVENOUS | Status: AC

## 2022-12-26 MED ORDER — DIPHENHYDRAMINE HCL 50 MG/ML IJ SOLN
50.0000 mg | Freq: Once | INTRAMUSCULAR | Status: AC
  Administered 2022-12-26: 50 mg via INTRAVENOUS
  Filled 2022-12-26: qty 1

## 2022-12-26 MED ORDER — SODIUM CHLORIDE 0.9% FLUSH
10.0000 mL | INTRAVENOUS | Status: DC | PRN
  Administered 2022-12-26: 10 mL

## 2022-12-26 MED ORDER — SODIUM CHLORIDE 0.9 % IV SOLN
10.0000 mg | Freq: Once | INTRAVENOUS | Status: AC
  Administered 2022-12-26: 10 mg via INTRAVENOUS
  Filled 2022-12-26: qty 10

## 2022-12-26 MED ORDER — PALONOSETRON HCL INJECTION 0.25 MG/5ML
0.2500 mg | Freq: Once | INTRAVENOUS | Status: AC
  Administered 2022-12-26: 0.25 mg via INTRAVENOUS
  Filled 2022-12-26: qty 5

## 2022-12-26 MED ORDER — SODIUM CHLORIDE 0.9 % IV SOLN
15.0000 mg/kg | Freq: Once | INTRAVENOUS | Status: AC
  Administered 2022-12-26: 1000 mg via INTRAVENOUS
  Filled 2022-12-26: qty 32

## 2022-12-26 MED ORDER — SODIUM CHLORIDE 0.9 % IV SOLN
150.0000 mg | Freq: Once | INTRAVENOUS | Status: AC
  Administered 2022-12-26: 150 mg via INTRAVENOUS
  Filled 2022-12-26: qty 150

## 2022-12-26 MED ORDER — HEPARIN SOD (PORK) LOCK FLUSH 100 UNIT/ML IV SOLN
500.0000 [IU] | Freq: Once | INTRAVENOUS | Status: AC | PRN
  Administered 2022-12-26: 500 [IU]

## 2022-12-26 MED ORDER — SODIUM CHLORIDE 0.9 % IV SOLN
1200.0000 mg | Freq: Once | INTRAVENOUS | Status: AC
  Administered 2022-12-26: 1200 mg via INTRAVENOUS
  Filled 2022-12-26: qty 20

## 2022-12-26 NOTE — Progress Notes (Unsigned)
No navigational needs at this time. She will need a PET after her next cycle.   Oncology Nurse Navigator Documentation     12/26/2022    1:00 PM  Oncology Nurse Navigator Flowsheets  Navigator Follow Up Date: 01/16/2023  Navigator Follow Up Reason: Follow-up Appointment;Chemotherapy  Navigator Location [facility]  Patient Visit Type MedOnc  Treatment Phase Active Tx  Barriers/Navigation Needs No Barriers At This Time  Interventions None Required  Acuity Level 1-No Barriers  Support Groups/Services Friends and Family  Time Spent with Patient 15

## 2022-12-26 NOTE — Patient Instructions (Signed)
[facility]  Discharge Instructions: Thank you for choosing [facility] to provide your oncology and hematology care.   If you have a lab appointment with the Cancer Center, please go directly to the Cancer Center and check in at the registration area.  Wear comfortable clothing and clothing appropriate for easy access to any Portacath or PICC line.   We strive to give you quality time with your provider. You may need to reschedule your appointment if you arrive late (15 or more minutes).  Arriving late affects you and other patients whose appointments are after yours.  Also, if you miss three or more appointments without notifying the office, you may be dismissed from the clinic at the provider's discretion.      For prescription refill requests, have your pharmacy contact our office and allow 72 hours for refills to be completed.    Today you received the following chemotherapy and/or immunotherapy agents Tecentriq, Carboplatin, Taxol      To help prevent nausea and vomiting after your treatment, we encourage you to take your nausea medication as directed.  BELOW ARE SYMPTOMS THAT SHOULD BE REPORTED IMMEDIATELY: *FEVER GREATER THAN 100.4 F (38 C) OR HIGHER *CHILLS OR SWEATING *NAUSEA AND VOMITING THAT IS NOT CONTROLLED WITH YOUR NAUSEA MEDICATION *UNUSUAL SHORTNESS OF BREATH *UNUSUAL BRUISING OR BLEEDING *URINARY PROBLEMS (pain or burning when urinating, or frequent urination) *BOWEL PROBLEMS (unusual diarrhea, constipation, pain near the anus) TENDERNESS IN MOUTH AND THROAT WITH OR WITHOUT PRESENCE OF ULCERS (sore throat, sores in mouth, or a toothache) UNUSUAL RASH, SWELLING OR PAIN  UNUSUAL VAGINAL DISCHARGE OR ITCHING   Items with * indicate a potential emergency and should be followed up as soon as possible or go to the Emergency Department if any problems should occur.  Please show the CHEMOTHERAPY ALERT CARD or IMMUNOTHERAPY  ALERT CARD at check-in to the Emergency Department and triage nurse. Should you have questions after your visit or need to cancel or reschedule your appointment, please contact [facility] [place]  [phone] and follow the prompts.  Office hours are 8:00 a.m. to 4:30 p.m. Monday - Friday. Please note that voicemails left after 4:00 p.m. may not be returned until the following business day.  We are closed weekends and major holidays. You have access to a nurse at all times for urgent questions. Please call the main number to the clinic [phone] and follow the prompts.  For any non-urgent questions, you may also contact your provider using MyChart. We now offer e-Visits for anyone 4 and older to request care online for non-urgent symptoms. For details visit [email or web address].   Also download the MyChart app! Go to the app store, search "MyChart", open the app, select [place], and log in with your MyChart username and password.

## 2022-12-26 NOTE — Patient Instructions (Signed)

## 2022-12-26 NOTE — Progress Notes (Signed)
Hematology and Oncology Follow Up Visit  Rhonda Blackwell [id number] Oct 26, 1950 72 y.o. 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 twice daily     Interim History:  Rhonda Blackwell is in for follow-up.  It seems like every time I see her, she seems to be doing better.  She is gaining weight.  She is eating well.  She is having no problems with the atrial fibrillation.  She is on Eliquis.  She has had no problems with cough or shortness of breath.  There is been no bleeding.  Is been no change in bowel or bladder habits.  She has had no pain issues.  There is been no leg swelling.  She has had no headache.  There is been no problems with nausea or vomiting.  Overall, I would have to say that her performance status is probably ECOG 1.    Medications:  Current Outpatient Medications:    acetaminophen (TYLENOL) 650 MG CR tablet, Take 650 mg by mouth every 8 (eight) hours as needed for pain., Disp: , Rfl:    albuterol (VENTOLIN HFA) 108 (90 Base) MCG/ACT inhaler, Inhale into the lungs every 6 (six) hours as needed for wheezing or shortness of breath., Disp: , Rfl:    amiodarone (PACERONE) 200 MG tablet, Take 1 tablet (200 mg total) by mouth daily., Disp: 30 tablet, Rfl: 0   apixaban (ELIQUIS) 5 MG TABS tablet, Take 1 tablet (5 mg total) by mouth 2 (two) times daily., Disp: 180 tablet, Rfl: 3   atorvastatin (LIPITOR) 40 MG tablet, Take 40 mg by mouth daily., Disp: , Rfl:    bisacodyl (DULCOLAX) 5 MG EC tablet, Take 1 tablet (5 mg total) by mouth daily as needed for moderate constipation., Disp: 30 tablet, Rfl: 0   dexamethasone (DECADRON) 4 MG tablet, Take 2 tablets (8mg ) by mouth daily starting the day after carboplatin for 3 days. Take with food, Disp: 30  tablet, Rfl: 1   DULoxetine (CYMBALTA) 60 MG capsule, Take 60 mg by mouth 2 (two) times daily., Disp: , Rfl:    fluticasone (FLONASE) 50 MCG/ACT nasal spray, Place 2 sprays into both nostrils daily., Disp: , Rfl:    fluticasone-salmeterol (ADVAIR) 250-50 MCG/ACT AEPB, Inhale 1 puff into the lungs 2 (two) times daily as needed (wheezing, shortness of breath.)., Disp: , Rfl:    furosemide (LASIX) 40 MG tablet, Take 40 mg by mouth daily., Disp: , Rfl:    guaiFENesin (MUCINEX) 600 MG 12 hr tablet, Take by mouth 2 (two) times daily., Disp: , Rfl:    HYDROcodone-acetaminophen (NORCO/VICODIN) 5-325 MG tablet, Take 1 tablet by mouth every 4 (four) hours as needed for moderate pain., Disp: , Rfl:    insulin aspart (NOVOLOG) 100 UNIT/ML injection, Inject 0-6 Units into the skin 3 (three) times daily with meals., Disp: 10 mL, Rfl: 11   JANUMET 50-500 MG tablet, Take 1 tablet by mouth 2 (two) times daily., Disp: , Rfl:    labetalol (NORMODYNE) 100 MG tablet, Take 100 mg by mouth 2 (two) times daily., Disp: , Rfl:    lidocaine-prilocaine (EMLA) cream, Apply to affected area one hour prior to accessing port on treatment days., Disp: 30 g, Rfl: 3   magnesium oxide (MAG-OX) 400 (240 Mg) MG tablet, Take 400 mg by mouth 2 (two) times daily.,  Disp: , Rfl:    ondansetron (ZOFRAN) 8 MG tablet, Take 1 tablet (8 mg total) by mouth every 8 (eight) hours as needed for nausea or vomiting. Start on the third day after carboplatin. (Patient not taking: Reported on 11/14/2022), Disp: 30 tablet, Rfl: 1   pantoprazole (PROTONIX) 40 MG tablet, Take 40 mg by mouth daily., Disp: , Rfl:    polyethylene glycol (MIRALAX / GLYCOLAX) 17 g packet, Take 17 g by mouth 2 (two) times daily., Disp: 14 each, Rfl: 0   POTASSIUM CHLORIDE CRYS ER PO, Take 20 mEq by mouth daily., Disp: , Rfl:    prochlorperazine (COMPAZINE) 10 MG tablet, Take 1 tablet (10 mg total) by mouth every 6 (six) hours as needed for nausea or vomiting. (Patient not taking:  Reported on 10/02/2022), Disp: 30 tablet, Rfl: 1  Allergies:  Allergies  Allergen Reactions   Bystolic [Nebivolol Hcl] Nausea Only and Other (See Comments)    Abdominal pain   Hydrochlorothiazide Nausea Only and Swelling    Leg swelling   Amlodipine Swelling and Other (See Comments)    Leg swelling  Abdominal pain   Nebivolol Nausea Only and Other (See Comments)    Abdominal pain    Past Medical History, Surgical history, Social history, and Family History were reviewed and updated.  Review of Systems: Review of Systems  Constitutional:  Positive for fatigue.  HENT:  Negative.    Eyes: Negative.   Respiratory:  Positive for cough and shortness of breath.   Cardiovascular: Negative.   Gastrointestinal: Negative.   Endocrine: Negative.   Genitourinary: Negative.    Musculoskeletal: Negative.   Skin: Negative.   Neurological: Negative.   Hematological: Negative.   Psychiatric/Behavioral: Negative.      Physical Exam:  height is 5\' 4"  (1.626 m) and weight is 137 lb (62.1 kg). Her oral temperature is 98 F (36.7 C). Her blood pressure is 145/77 (abnormal) and her pulse is 63. Her respiration is 18 and oxygen saturation is 96%.   Wt Readings from Last 3 Encounters:  12/26/22 137 lb (62.1 kg)  12/04/22 133 lb (60.3 kg)  11/30/22 134 lb 1.6 oz (60.8 kg)    Physical Exam Vitals reviewed.  Constitutional:      Comments: Fairly well-developed and well-nourished Asian female in no obvious distress.    HENT:     Head: Normocephalic and atraumatic.  Eyes:     Pupils: Pupils are equal, round, and reactive to light.  Cardiovascular:     Rate and Rhythm: Normal rate and regular rhythm.     Heart sounds: Normal heart sounds.     Comments: Cardiac exam is regular rate and rhythm.  I really do not hear any atrial fibrillation. Pulmonary:     Effort: Pulmonary effort is normal.     Breath sounds: Normal breath sounds.     Comments: Lungs sound clear on the right side.  She has  decent breath sounds on the left side.  I do not hear any wheezing. Abdominal:     General: Bowel sounds are normal.     Palpations: Abdomen is soft.  Musculoskeletal:        General: No tenderness or deformity. Normal range of motion.     Cervical back: Normal range of motion.  Lymphadenopathy:     Cervical: No cervical adenopathy.  Skin:    General: Skin is warm and dry.     Findings: No erythema or rash.  Neurological:     Mental Status:  She is alert and oriented to person, place, and time.  Psychiatric:        Behavior: Behavior normal.        Thought Content: Thought content normal.        Judgment: Judgment normal.     Lab Results  Component Value Date   WBC 7.0 12/26/2022   HGB 10.7 (L) 12/26/2022   HCT 34.1 (L) 12/26/2022   MCV 89.0 12/26/2022   PLT 254 12/26/2022     Chemistry      Component Value Date/Time   NA 138 12/04/2022 0850   K 4.1 12/04/2022 0850   CL 101 12/04/2022 0850   CO2 29 12/04/2022 0850   BUN 15 12/04/2022 0850   CREATININE 0.80 12/04/2022 0850      Component Value Date/Time   CALCIUM 9.3 12/04/2022 0850   CALCIUM 7.0 (L) 09/05/2022 0839   ALKPHOS 67 12/04/2022 0850   AST 10 (L) 12/04/2022 0850   ALT 7 12/04/2022 0850   BILITOT 0.6 12/04/2022 0850       Impression and Plan: Ms. Appell is a very charming 72 year old Asian female.  I think she is originally from [place].  She has metastatic adenocarcinoma of the lung.  She had palliative radiation therapy with low-dose chemotherapy in the hospital.  She now is on full dose chemotherapy.  Again she is responded incredibly well.  Her quality of life is better.  Her pulmonary status is also better.  We will go ahead with her fifth cycle of treatment.  After her 6 cycle, we will do a PET scan and then see about getting her on maintenance therapy.  I am just happy that she is doing well.  She has such incredible support from her family.  It certainly makes me feel good to know that her  family is so dedicated to her.    Josph Macho, MD 7/9/20249:03 AM

## 2022-12-27 ENCOUNTER — Telehealth: Payer: Self-pay | Admitting: Nurse Practitioner

## 2022-12-27 NOTE — Telephone Encounter (Signed)
Scheduled appointment per scheduling message. Talked with the patients spouse Loraine Leriche and he is aware of the made appointment for the patient.

## 2022-12-28 ENCOUNTER — Inpatient Hospital Stay: Payer: Medicare Other

## 2022-12-28 VITALS — BP 135/79 | HR 74 | Temp 98.7°F | Resp 20

## 2022-12-28 DIAGNOSIS — C3492 Malignant neoplasm of unspecified part of left bronchus or lung: Secondary | ICD-10-CM

## 2022-12-28 MED ORDER — PEGFILGRASTIM-CBQV 6 MG/0.6ML ~~LOC~~ SOSY
6.0000 mg | PREFILLED_SYRINGE | Freq: Once | SUBCUTANEOUS | Status: DC
  Filled 2022-12-28: qty 0.6

## 2022-12-28 NOTE — Patient Instructions (Signed)

## 2022-12-30 NOTE — Progress Notes (Signed)
Left hilum and left middle lobe

## 2023-01-12 ENCOUNTER — Telehealth: Payer: Self-pay | Admitting: *Deleted

## 2023-01-12 NOTE — Telephone Encounter (Signed)
Call received from patient's daughter Rhonda Blackwell to inform Dr. Myna Hidalgo that pt passed away this morning at home at approximately 10:30AM.  Dr. Myna Hidalgo notified and flowers sent to patient's family.

## 2023-01-16 ENCOUNTER — Inpatient Hospital Stay: Payer: Medicare Other | Admitting: Hematology & Oncology

## 2023-01-16 ENCOUNTER — Inpatient Hospital Stay: Payer: Medicare Other

## 2023-01-18 ENCOUNTER — Inpatient Hospital Stay: Payer: Medicare Other

## 2023-01-18 DEATH — deceased

## 2023-01-22 ENCOUNTER — Encounter: Payer: Self-pay | Admitting: *Deleted

## 2023-01-22 NOTE — Progress Notes (Signed)
Patient passed away 12/26/2022.  Oncology Nurse Navigator Documentation     01/22/2023    2:15 PM  Oncology Nurse Navigator Flowsheets  Navigation Complete Date: 01/22/2023  Post Navigation: Continue to Follow Patient? No  Reason Not Navigating Patient: Pharmacologist Encounter Type Appt/Treatment Plan Review  Time Spent with Patient 15
